# Patient Record
Sex: Female | Born: 1935 | Race: White | Hispanic: No | Marital: Single | Smoking: Current every day smoker
Health system: Southern US, Community
[De-identification: ages and names within clinical notes are randomized; demographics above are authoritative.]

## PROBLEM LIST (undated history)

## (undated) DIAGNOSIS — E039 Hypothyroidism, unspecified: Secondary | ICD-10-CM

## (undated) DIAGNOSIS — K219 Gastro-esophageal reflux disease without esophagitis: Secondary | ICD-10-CM

## (undated) DIAGNOSIS — J45909 Unspecified asthma, uncomplicated: Secondary | ICD-10-CM

## (undated) DIAGNOSIS — Q254 Congenital malformation of aorta unspecified: Secondary | ICD-10-CM

## (undated) DIAGNOSIS — N189 Chronic kidney disease, unspecified: Secondary | ICD-10-CM

## (undated) DIAGNOSIS — F32A Depression, unspecified: Secondary | ICD-10-CM

## (undated) DIAGNOSIS — M4800 Spinal stenosis, site unspecified: Secondary | ICD-10-CM

## (undated) DIAGNOSIS — C801 Malignant (primary) neoplasm, unspecified: Secondary | ICD-10-CM

## (undated) DIAGNOSIS — I10 Essential (primary) hypertension: Secondary | ICD-10-CM

## (undated) DIAGNOSIS — Z9221 Personal history of antineoplastic chemotherapy: Secondary | ICD-10-CM

## (undated) DIAGNOSIS — F419 Anxiety disorder, unspecified: Secondary | ICD-10-CM

## (undated) DIAGNOSIS — J449 Chronic obstructive pulmonary disease, unspecified: Secondary | ICD-10-CM

## (undated) DIAGNOSIS — I2510 Atherosclerotic heart disease of native coronary artery without angina pectoris: Secondary | ICD-10-CM

## (undated) DIAGNOSIS — F329 Major depressive disorder, single episode, unspecified: Secondary | ICD-10-CM

## (undated) DIAGNOSIS — D649 Anemia, unspecified: Secondary | ICD-10-CM

## (undated) HISTORY — PX: BACK SURGERY: SHX140

## (undated) HISTORY — PX: ABDOMINAL HYSTERECTOMY: SHX81

## (undated) HISTORY — PX: OTHER SURGICAL HISTORY: SHX169

## (undated) HISTORY — PX: SHOULDER SURGERY: SHX246

---

## 2001-11-06 HISTORY — PX: BREAST BIOPSY: SHX20

## 2003-10-21 ENCOUNTER — Other Ambulatory Visit: Payer: Self-pay

## 2004-11-08 ENCOUNTER — Ambulatory Visit: Payer: Self-pay | Admitting: Anesthesiology

## 2004-11-09 ENCOUNTER — Ambulatory Visit: Payer: Self-pay | Admitting: Anesthesiology

## 2004-11-28 ENCOUNTER — Ambulatory Visit: Payer: Self-pay | Admitting: Anesthesiology

## 2004-12-06 ENCOUNTER — Ambulatory Visit: Payer: Self-pay | Admitting: Orthopaedic Surgery

## 2005-05-03 ENCOUNTER — Ambulatory Visit: Payer: Self-pay | Admitting: Family Medicine

## 2005-08-25 ENCOUNTER — Ambulatory Visit: Payer: Self-pay | Admitting: Family Medicine

## 2006-04-02 ENCOUNTER — Other Ambulatory Visit: Payer: Self-pay

## 2006-04-02 ENCOUNTER — Inpatient Hospital Stay: Payer: Self-pay | Admitting: Internal Medicine

## 2006-08-30 ENCOUNTER — Ambulatory Visit: Payer: Self-pay | Admitting: Family Medicine

## 2007-08-06 ENCOUNTER — Encounter: Payer: Self-pay | Admitting: Sports Medicine

## 2007-08-12 ENCOUNTER — Ambulatory Visit: Payer: Self-pay | Admitting: Sports Medicine

## 2007-09-07 ENCOUNTER — Encounter: Payer: Self-pay | Admitting: Sports Medicine

## 2007-09-09 ENCOUNTER — Ambulatory Visit: Payer: Self-pay | Admitting: Unknown Physician Specialty

## 2007-09-09 ENCOUNTER — Other Ambulatory Visit: Payer: Self-pay

## 2007-09-16 ENCOUNTER — Inpatient Hospital Stay: Payer: Self-pay | Admitting: Unknown Physician Specialty

## 2007-12-18 ENCOUNTER — Ambulatory Visit: Payer: Self-pay | Admitting: Internal Medicine

## 2008-12-18 ENCOUNTER — Ambulatory Visit: Payer: Self-pay | Admitting: Internal Medicine

## 2008-12-25 ENCOUNTER — Ambulatory Visit: Payer: Self-pay | Admitting: Unknown Physician Specialty

## 2009-02-24 ENCOUNTER — Ambulatory Visit: Payer: Self-pay | Admitting: Internal Medicine

## 2009-12-20 ENCOUNTER — Ambulatory Visit: Payer: Self-pay | Admitting: Internal Medicine

## 2010-02-01 ENCOUNTER — Ambulatory Visit: Payer: Self-pay | Admitting: Cardiovascular Disease

## 2010-02-01 ENCOUNTER — Inpatient Hospital Stay: Payer: Self-pay | Admitting: Internal Medicine

## 2010-03-01 ENCOUNTER — Ambulatory Visit: Payer: Self-pay | Admitting: Gastroenterology

## 2010-04-06 ENCOUNTER — Ambulatory Visit: Payer: Self-pay | Admitting: Surgery

## 2010-09-02 ENCOUNTER — Inpatient Hospital Stay: Payer: Self-pay | Admitting: *Deleted

## 2010-10-13 ENCOUNTER — Ambulatory Visit: Payer: Self-pay | Admitting: Gastroenterology

## 2010-10-24 ENCOUNTER — Inpatient Hospital Stay: Payer: Self-pay | Admitting: Internal Medicine

## 2011-01-09 ENCOUNTER — Ambulatory Visit: Payer: Self-pay | Admitting: Gastroenterology

## 2011-01-10 ENCOUNTER — Ambulatory Visit: Payer: Self-pay | Admitting: Family Medicine

## 2011-01-23 ENCOUNTER — Ambulatory Visit: Payer: Self-pay | Admitting: Family Medicine

## 2011-02-08 ENCOUNTER — Ambulatory Visit: Payer: Self-pay | Admitting: Gastroenterology

## 2011-07-31 ENCOUNTER — Ambulatory Visit: Payer: Self-pay | Admitting: Family Medicine

## 2012-01-17 ENCOUNTER — Ambulatory Visit: Payer: Self-pay | Admitting: Ophthalmology

## 2012-01-30 ENCOUNTER — Ambulatory Visit: Payer: Self-pay | Admitting: Ophthalmology

## 2012-08-09 ENCOUNTER — Ambulatory Visit: Payer: Self-pay | Admitting: Otolaryngology

## 2012-11-22 ENCOUNTER — Ambulatory Visit: Payer: Self-pay | Admitting: Vascular Surgery

## 2012-11-27 ENCOUNTER — Other Ambulatory Visit (HOSPITAL_COMMUNITY): Payer: Self-pay | Admitting: *Deleted

## 2012-11-27 DIAGNOSIS — Z139 Encounter for screening, unspecified: Secondary | ICD-10-CM

## 2012-12-09 ENCOUNTER — Ambulatory Visit (HOSPITAL_COMMUNITY): Payer: Self-pay

## 2012-12-24 ENCOUNTER — Ambulatory Visit: Payer: Self-pay

## 2013-01-18 ENCOUNTER — Emergency Department: Payer: Self-pay | Admitting: Emergency Medicine

## 2013-11-26 ENCOUNTER — Ambulatory Visit: Payer: Self-pay | Admitting: Physical Medicine and Rehabilitation

## 2013-12-05 ENCOUNTER — Ambulatory Visit: Payer: Self-pay | Admitting: Vascular Surgery

## 2014-01-19 ENCOUNTER — Ambulatory Visit: Payer: Self-pay | Admitting: Physician Assistant

## 2015-02-11 ENCOUNTER — Ambulatory Visit
Admit: 2015-02-11 | Disposition: A | Discharge status: Home / Self Care | Payer: Self-pay | Attending: Physician Assistant | Admitting: Physician Assistant

## 2015-02-28 NOTE — Op Note (Signed)
PATIENT NAME:  Veronica Hubbard, Veronica Hubbard MR#:  [id number] DATE OF BIRTH:  27-Feb-1935  DATE OF PROCEDURE:  01/30/2012  PREOPERATIVE DIAGNOSIS: Visually significant cataract of the right eye.   POSTOPERATIVE DIAGNOSIS: Visually significant cataract of the right eye.   OPERATIVE PROCEDURE: Cataract extraction by phacoemulsification with implant of intraocular lens to the right eye.   SURGEON: Birder Robson, MD.   ANESTHESIA:  1. Managed anesthesia care.  2. Topical tetracaine drops followed by 2% Xylocaine jelly applied in the preoperative holding area.   COMPLICATIONS: None.   TECHNIQUE:  Stop and chop.  DESCRIPTION OF PROCEDURE: The patient was examined and consented in the preoperative holding area where the aforementioned topical anesthesia was applied to the right eye and then brought back to the Operating Room where the right eye was prepped and draped in the usual sterile ophthalmic fashion and a lid speculum was placed. A paracentesis was created with the side port blade and the anterior chamber was filled with viscoelastic. A near clear corneal incision was performed with the steel keratome. A continuous curvilinear capsulorrhexis was performed with a cystotome followed by the capsulorrhexis forceps. Hydrodissection and hydrodelineation were carried out with BSS on a blunt cannula. The lens was removed in a stop and chop technique and the remaining cortical material was removed with the irrigation-aspiration handpiece. The capsular bag was inflated with viscoelastic and the Technos ZCB00 24-diopter lens, serial number [id number] was placed in the capsular bag without complication. The remaining viscoelastic was removed from the eye with the irrigation-aspiration handpiece. The wounds were hydrated. The anterior chamber was flushed with Miostat and the eye was inflated to physiologic pressure. The wounds were found to be water tight. The eye was dressed with Vigamox. The patient was given  protective glasses to wear throughout the day and a shield with which to sleep tonight. The patient was also given drops with which to begin a drop regimen today and will follow up with me in one day.    ____________________________ Livingston Diones. Tashauna Caisse, MD wlp:bjt D: 01/30/2012 12:24:22 ET T: 01/30/2012 13:05:14 ET JOB#: [id number]  cc: Lance Huaracha L. Shelbie Franken, MD, <Dictator> Livingston Diones Yesica Kemler MD ELECTRONICALLY SIGNED 02/09/2012 20:56

## 2016-02-09 ENCOUNTER — Emergency Department: Payer: Medicare HMO

## 2016-02-09 ENCOUNTER — Observation Stay
Admission: EM | Admit: 2016-02-09 | Discharge: 2016-02-10 | Disposition: A | Discharge status: Home / Self Care | Payer: Medicare HMO | Attending: Internal Medicine | Admitting: Internal Medicine

## 2016-02-09 ENCOUNTER — Encounter: Payer: Self-pay | Admitting: Emergency Medicine

## 2016-02-09 DIAGNOSIS — R161 Splenomegaly, not elsewhere classified: Secondary | ICD-10-CM | POA: Diagnosis not present

## 2016-02-09 DIAGNOSIS — K219 Gastro-esophageal reflux disease without esophagitis: Secondary | ICD-10-CM | POA: Diagnosis not present

## 2016-02-09 DIAGNOSIS — D72829 Elevated white blood cell count, unspecified: Secondary | ICD-10-CM | POA: Diagnosis present

## 2016-02-09 DIAGNOSIS — Z885 Allergy status to narcotic agent status: Secondary | ICD-10-CM | POA: Diagnosis not present

## 2016-02-09 DIAGNOSIS — R1084 Generalized abdominal pain: Secondary | ICD-10-CM

## 2016-02-09 DIAGNOSIS — R079 Chest pain, unspecified: Secondary | ICD-10-CM | POA: Insufficient documentation

## 2016-02-09 DIAGNOSIS — Z8249 Family history of ischemic heart disease and other diseases of the circulatory system: Secondary | ICD-10-CM | POA: Diagnosis not present

## 2016-02-09 DIAGNOSIS — K5730 Diverticulosis of large intestine without perforation or abscess without bleeding: Secondary | ICD-10-CM | POA: Insufficient documentation

## 2016-02-09 DIAGNOSIS — I517 Cardiomegaly: Secondary | ICD-10-CM | POA: Insufficient documentation

## 2016-02-09 DIAGNOSIS — Z91041 Radiographic dye allergy status: Secondary | ICD-10-CM | POA: Insufficient documentation

## 2016-02-09 DIAGNOSIS — J45909 Unspecified asthma, uncomplicated: Secondary | ICD-10-CM | POA: Insufficient documentation

## 2016-02-09 DIAGNOSIS — I10 Essential (primary) hypertension: Secondary | ICD-10-CM | POA: Insufficient documentation

## 2016-02-09 DIAGNOSIS — Z90710 Acquired absence of both cervix and uterus: Secondary | ICD-10-CM | POA: Diagnosis not present

## 2016-02-09 DIAGNOSIS — R531 Weakness: Secondary | ICD-10-CM | POA: Diagnosis present

## 2016-02-09 DIAGNOSIS — E039 Hypothyroidism, unspecified: Secondary | ICD-10-CM | POA: Insufficient documentation

## 2016-02-09 DIAGNOSIS — Z888 Allergy status to other drugs, medicaments and biological substances status: Secondary | ICD-10-CM | POA: Insufficient documentation

## 2016-02-09 DIAGNOSIS — Z806 Family history of leukemia: Secondary | ICD-10-CM | POA: Diagnosis not present

## 2016-02-09 DIAGNOSIS — N289 Disorder of kidney and ureter, unspecified: Secondary | ICD-10-CM | POA: Diagnosis not present

## 2016-02-09 DIAGNOSIS — N200 Calculus of kidney: Secondary | ICD-10-CM | POA: Diagnosis not present

## 2016-02-09 DIAGNOSIS — Z886 Allergy status to analgesic agent status: Secondary | ICD-10-CM | POA: Diagnosis not present

## 2016-02-09 DIAGNOSIS — E785 Hyperlipidemia, unspecified: Secondary | ICD-10-CM | POA: Diagnosis not present

## 2016-02-09 DIAGNOSIS — M4800 Spinal stenosis, site unspecified: Secondary | ICD-10-CM | POA: Insufficient documentation

## 2016-02-09 DIAGNOSIS — J9811 Atelectasis: Secondary | ICD-10-CM | POA: Diagnosis not present

## 2016-02-09 DIAGNOSIS — Z87891 Personal history of nicotine dependence: Secondary | ICD-10-CM | POA: Diagnosis not present

## 2016-02-09 DIAGNOSIS — A0811 Acute gastroenteropathy due to Norwalk agent: Principal | ICD-10-CM | POA: Diagnosis present

## 2016-02-09 DIAGNOSIS — F329 Major depressive disorder, single episode, unspecified: Secondary | ICD-10-CM | POA: Diagnosis not present

## 2016-02-09 DIAGNOSIS — I700 Atherosclerosis of aorta: Secondary | ICD-10-CM | POA: Insufficient documentation

## 2016-02-09 DIAGNOSIS — Z833 Family history of diabetes mellitus: Secondary | ICD-10-CM | POA: Insufficient documentation

## 2016-02-09 HISTORY — DX: Major depressive disorder, single episode, unspecified: F32.9

## 2016-02-09 HISTORY — DX: Unspecified asthma, uncomplicated: J45.909

## 2016-02-09 HISTORY — DX: Depression, unspecified: F32.A

## 2016-02-09 HISTORY — DX: Essential (primary) hypertension: I10

## 2016-02-09 HISTORY — DX: Hypothyroidism, unspecified: E03.9

## 2016-02-09 HISTORY — DX: Spinal stenosis, site unspecified: M48.00

## 2016-02-09 HISTORY — DX: Gastro-esophageal reflux disease without esophagitis: K21.9

## 2016-02-09 LAB — URINALYSIS COMPLETE WITH MICROSCOPIC (ARMC ONLY)
BILIRUBIN URINE: NEGATIVE
Glucose, UA: NEGATIVE mg/dL
KETONES UR: NEGATIVE mg/dL
Nitrite: NEGATIVE
PH: 5 (ref 5.0–8.0)
PROTEIN: NEGATIVE mg/dL
Specific Gravity, Urine: 1.014 (ref 1.005–1.030)

## 2016-02-09 LAB — COMPREHENSIVE METABOLIC PANEL
ALBUMIN: 4.8 g/dL (ref 3.5–5.0)
ALK PHOS: 54 U/L (ref 38–126)
ALT: 12 U/L — ABNORMAL LOW (ref 14–54)
ANION GAP: 8 (ref 5–15)
AST: 31 U/L (ref 15–41)
BUN: 25 mg/dL — ABNORMAL HIGH (ref 6–20)
CALCIUM: 10.3 mg/dL (ref 8.9–10.3)
CHLORIDE: 109 mmol/L (ref 101–111)
CO2: 20 mmol/L — AB (ref 22–32)
Creatinine, Ser: 1.62 mg/dL — ABNORMAL HIGH (ref 0.44–1.00)
GFR calc non Af Amer: 29 mL/min — ABNORMAL LOW (ref >60)
GFR, EST AFRICAN AMERICAN: 34 mL/min — AB (ref >60)
GLUCOSE: 141 mg/dL — AB (ref 65–99)
POTASSIUM: 3.8 mmol/L (ref 3.5–5.1)
SODIUM: 137 mmol/L (ref 135–145)
Total Bilirubin: 0.7 mg/dL (ref 0.3–1.2)
Total Protein: 8 g/dL (ref 6.5–8.1)

## 2016-02-09 LAB — GASTROINTESTINAL PANEL BY PCR, STOOL (REPLACES STOOL CULTURE)
Adenovirus F40/41: NOT DETECTED
Astrovirus: NOT DETECTED
CYCLOSPORA CAYETANENSIS: NOT DETECTED
Campylobacter species: NOT DETECTED
Cryptosporidium: NOT DETECTED
E. COLI O157: NOT DETECTED
ENTAMOEBA HISTOLYTICA: NOT DETECTED
ENTEROAGGREGATIVE E COLI (EAEC): NOT DETECTED
ENTEROPATHOGENIC E COLI (EPEC): NOT DETECTED
Enterotoxigenic E coli (ETEC): NOT DETECTED
GIARDIA LAMBLIA: NOT DETECTED
Norovirus GI/GII: DETECTED — AB
Plesimonas shigelloides: NOT DETECTED
Rotavirus A: NOT DETECTED
SALMONELLA SPECIES: NOT DETECTED
SAPOVIRUS (I, II, IV, AND V): NOT DETECTED
SHIGELLA/ENTEROINVASIVE E COLI (EIEC): NOT DETECTED
Shiga like toxin producing E coli (STEC): NOT DETECTED
VIBRIO CHOLERAE: NOT DETECTED
Vibrio species: NOT DETECTED
Yersinia enterocolitica: NOT DETECTED

## 2016-02-09 LAB — CBC
HEMATOCRIT: 43.5 % (ref 35.0–47.0)
HEMOGLOBIN: 14.1 g/dL (ref 12.0–16.0)
MCH: 25.8 pg — AB (ref 26.0–34.0)
MCHC: 32.5 g/dL (ref 32.0–36.0)
MCV: 79.6 fL — AB (ref 80.0–100.0)
Platelets: 430 10*3/uL (ref 150–440)
RBC: 5.46 MIL/uL — AB (ref 3.80–5.20)
RDW: 21.5 % — ABNORMAL HIGH (ref 11.5–14.5)
WBC: 26.9 10*3/uL — ABNORMAL HIGH (ref 3.6–11.0)

## 2016-02-09 LAB — C DIFFICILE QUICK SCREEN W PCR REFLEX
C DIFFICILE (CDIFF) TOXIN: NEGATIVE
C Diff antigen: NEGATIVE
C Diff interpretation: NEGATIVE

## 2016-02-09 LAB — LIPASE, BLOOD: Lipase: 36 U/L (ref 11–51)

## 2016-02-09 MED ORDER — CITALOPRAM HYDROBROMIDE 20 MG PO TABS
20.0000 mg | ORAL_TABLET | Freq: Every day | ORAL | Status: DC
  Administered 2016-02-09 – 2016-02-10 (×2): 20 mg via ORAL
  Filled 2016-02-09 (×2): qty 1

## 2016-02-09 MED ORDER — LOPERAMIDE HCL 2 MG PO CAPS: 2.0000 mg | ORAL_CAPSULE | Freq: Four times a day (QID) | ORAL | Status: DC | PRN

## 2016-02-09 MED ORDER — ALBUTEROL SULFATE (2.5 MG/3ML) 0.083% IN NEBU: 2.5000 mg | INHALATION_SOLUTION | RESPIRATORY_TRACT | Status: DC | PRN

## 2016-02-09 MED ORDER — ACETAMINOPHEN 650 MG RE SUPP: 650.0000 mg | Freq: Four times a day (QID) | RECTAL | Status: DC | PRN

## 2016-02-09 MED ORDER — ACETAMINOPHEN 325 MG PO TABS: 650.0000 mg | ORAL_TABLET | Freq: Four times a day (QID) | ORAL | Status: DC | PRN

## 2016-02-09 MED ORDER — PANTOPRAZOLE SODIUM 40 MG PO TBEC
40.0000 mg | DELAYED_RELEASE_TABLET | Freq: Two times a day (BID) | ORAL | Status: DC
  Administered 2016-02-09 – 2016-02-10 (×3): 40 mg via ORAL
  Filled 2016-02-09 (×3): qty 1

## 2016-02-09 MED ORDER — ONDANSETRON HCL 4 MG/2ML IJ SOLN
4.0000 mg | Freq: Once | INTRAMUSCULAR | Status: AC
  Administered 2016-02-09: 4 mg via INTRAVENOUS
  Filled 2016-02-09: qty 2

## 2016-02-09 MED ORDER — OXYCODONE HCL 5 MG PO TABS: 5.0000 mg | ORAL_TABLET | ORAL | Status: DC | PRN

## 2016-02-09 MED ORDER — DIATRIZOATE MEGLUMINE & SODIUM 66-10 % PO SOLN: 15.0000 mL | ORAL | Status: DC

## 2016-02-09 MED ORDER — ONDANSETRON HCL 4 MG PO TABS: 4.0000 mg | ORAL_TABLET | Freq: Four times a day (QID) | ORAL | Status: DC | PRN

## 2016-02-09 MED ORDER — LEVOTHYROXINE SODIUM 100 MCG PO TABS
100.0000 ug | ORAL_TABLET | Freq: Every day | ORAL | Status: DC
  Administered 2016-02-10: 100 ug via ORAL
  Filled 2016-02-09: qty 1

## 2016-02-09 MED ORDER — ENOXAPARIN SODIUM 30 MG/0.3ML ~~LOC~~ SOLN: 30.0000 mg | SUBCUTANEOUS | Status: DC

## 2016-02-09 MED ORDER — ONDANSETRON HCL 4 MG/2ML IJ SOLN: 4.0000 mg | Freq: Four times a day (QID) | INTRAMUSCULAR | Status: DC | PRN

## 2016-02-09 MED ORDER — MORPHINE SULFATE (PF) 2 MG/ML IV SOLN
2.0000 mg | Freq: Once | INTRAVENOUS | Status: AC
  Administered 2016-02-09: 2 mg via INTRAVENOUS
  Filled 2016-02-09: qty 1

## 2016-02-09 MED ORDER — ENOXAPARIN SODIUM 40 MG/0.4ML ~~LOC~~ SOLN
40.0000 mg | SUBCUTANEOUS | Status: DC
  Administered 2016-02-09: 40 mg via SUBCUTANEOUS
  Filled 2016-02-09: qty 0.4

## 2016-02-09 MED ORDER — MORPHINE SULFATE (PF) 2 MG/ML IV SOLN: 1.0000 mg | INTRAVENOUS | Status: DC | PRN

## 2016-02-09 MED ORDER — EZETIMIBE 10 MG PO TABS
10.0000 mg | ORAL_TABLET | Freq: Every day | ORAL | Status: DC
  Administered 2016-02-09 – 2016-02-10 (×2): 10 mg via ORAL
  Filled 2016-02-09 (×2): qty 1

## 2016-02-09 MED ORDER — ALBUTEROL SULFATE HFA 108 (90 BASE) MCG/ACT IN AERS: 2.0000 | INHALATION_SPRAY | RESPIRATORY_TRACT | Status: DC | PRN

## 2016-02-09 MED ORDER — POTASSIUM CHLORIDE IN NACL 20-0.9 MEQ/L-% IV SOLN
INTRAVENOUS | Status: DC
  Administered 2016-02-09 – 2016-02-10 (×2): via INTRAVENOUS
  Filled 2016-02-09 (×4): qty 1000

## 2016-02-09 MED ORDER — LOPERAMIDE HCL 2 MG PO CAPS
4.0000 mg | ORAL_CAPSULE | Freq: Once | ORAL | Status: AC
Start: 2016-02-09 — End: 2016-02-09
  Administered 2016-02-09: 4 mg via ORAL
  Filled 2016-02-09: qty 2

## 2016-02-09 MED ORDER — SODIUM CHLORIDE 0.9 % IV BOLUS (SEPSIS)
1000.0000 mL | Freq: Once | INTRAVENOUS | Status: AC
  Administered 2016-02-09: 1000 mL via INTRAVENOUS

## 2016-02-09 MED ORDER — ISOSORBIDE MONONITRATE ER 30 MG PO TB24
30.0000 mg | ORAL_TABLET | Freq: Every day | ORAL | Status: DC
  Administered 2016-02-09 – 2016-02-10 (×2): 30 mg via ORAL
  Filled 2016-02-09 (×2): qty 1

## 2016-02-09 NOTE — ED Notes (Addendum)
Pt from home with n/v/d since yesterday. Pt states vomit was orange. Pt visited brother yesterday who has c-diff. Pt states she was given nausea medication en route. denies any fever pt A&O

## 2016-02-09 NOTE — ED Notes (Signed)
MD at bedside. 

## 2016-02-09 NOTE — Progress Notes (Signed)
Admitted to [facility] for norovirus, contact precautions initiated.  Patient resides at home alone and is considered to be a poor historian with some confusion.  Stated that she has had several falls at home and was placed on high fall risk.  Oriented to the room and verbalized understanding.  Denies pain and nausea but having frequent loose stools. Patient stated that POA is [place].  Will continue to monitor. Haynes Hoehn RN.

## 2016-02-09 NOTE — ED Notes (Signed)
Dr. Bobbye Charleston in room at this time.

## 2016-02-09 NOTE — H&P (Signed)
[place] at [place] NAME: Veronica Hubbard    MR#:  [id number]  DATE OF BIRTH:  1934/11/30  DATE OF ADMISSION:  02/09/2016  PRIMARY CARE PHYSICIAN: [facility]  REQUESTING/REFERRING PHYSICIAN: Jacinta Shoe  CHIEF COMPLAINT:   Chief Complaint  Patient presents with  . Emesis    HISTORY OF PRESENT ILLNESS:  Veronica Hubbard  is a 81 y.o. female presents with nausea vomiting and diarrhea. Patient also has severe abdominal pain. It all started yesterday. She visited brother in the hospital has similar symptoms but has C. difficile colitis. The patient developed nausea vomiting too many times to count. Abdominal pain 10 out of 10 intensity. Diarrhea watery and constant. No hematemesis or blood in the bowel movements. No fever but does have some chills. Patient's CAT scan of the abdomen and pelvis was negative but the patient did have positive norovirus  PAST MEDICAL HISTORY:   Past Medical History  Diagnosis Date  . Hypertension   . Asthma   . Hypothyroidism   . Depression   . GERD (gastroesophageal reflux disease)   . Spinal stenosis     PAST SURGICAL HISTORY:   Past Surgical History  Procedure Laterality Date  . Abdominal hysterectomy    . Cataracts    . Back surgery    . Shoulder surgery    . Lipoma removal      SOCIAL HISTORY:   Social History  Substance Use Topics  . Smoking status: Former Smoker    Types: Cigarettes  . Smokeless tobacco: Not on file  . Alcohol Use: No    FAMILY HISTORY:   Family History  Problem Relation Age of Onset  . Leukemia Mother   . CAD Father   . Diabetes Father     DRUG ALLERGIES:   Allergies  Allergen Reactions  . Aspirin     Other reaction(s): Unknown  . Celecoxib     Other reaction(s): Unknown  . Iodinated Diagnostic Agents     Other reaction(s): Other (See Comments) Decreased kidney function  . Losartan Itching  . Morphine     Other  reaction(s): Unknown  . Verapamil Itching    REVIEW OF SYSTEMS:  CONSTITUTIONAL: No fever, positive fatigue and weakness. Positive for chills EYES: No blurred or double vision.  EARS, NOSE, AND THROAT: No tinnitus or ear pain. No sore throat. Dry throat. RESPIRATORY: No cough, shortness of breath, wheezing or hemoptysis.  CARDIOVASCULAR: Positive for chest pain, no orthopnea, edema.  GASTROINTESTINAL: Positive for nausea, vomiting, diarrhea and abdominal pain. No blood in bowel movements GENITOURINARY: No dysuria, hematuria.  ENDOCRINE: No polyuria, nocturia,  HEMATOLOGY: No anemia, easy bruising or bleeding SKIN: No rash or lesion. MUSCULOSKELETAL: Positive for back pain and leg pain NEUROLOGIC: No tingling, numbness, weakness.  PSYCHIATRY: No anxiety or depression.   MEDICATIONS AT HOME:   Prior to Admission medications   Medication Sig Start Date End Date Taking? Authorizing Provider  albuterol (PROVENTIL HFA;VENTOLIN HFA) 108 (90 Base) MCG/ACT inhaler Inhale 2 puffs into the lungs every 4 (four) hours as needed for wheezing or shortness of breath.   Yes Historical Provider, MD  citalopram (CELEXA) 20 MG tablet Take 20 mg by mouth daily.   Yes Historical Provider, MD  ezetimibe (ZETIA) 10 MG tablet Take 10 mg by mouth daily.   Yes Historical Provider, MD  isosorbide mononitrate (IMDUR) 30 MG 24 hr tablet Take 30 mg by mouth daily.   Yes Historical Provider, MD  levothyroxine (SYNTHROID, LEVOTHROID) 100 MCG tablet Take 100 mcg by mouth daily before breakfast.   Yes Historical Provider, MD  omega-3 acid ethyl esters (LOVAZA) 1 g capsule Take 1 g by mouth daily.   Yes Historical Provider, MD  pantoprazole (PROTONIX) 40 MG tablet Take 40 mg by mouth 2 (two) times daily.   Yes Historical Provider, MD  valsartan (DIOVAN) 80 MG tablet Take 80 mg by mouth daily.   Yes Historical Provider, MD      VITAL SIGNS:  Blood pressure 125/54, pulse 59, temperature 97.7 F (36.5 C), temperature  source Oral, resp. rate 12, height 5\' 3"  (1.6 m), weight 78 kg (171 lb 15.3 oz), SpO2 94 %.  PHYSICAL EXAMINATION:  GENERAL:  81 y.o.-year-old patient lying in the bed with no acute distress.  EYES: Pupils equal, round, reactive to light and accommodation. No scleral icterus. Extraocular muscles intact.  HEENT: Head atraumatic, normocephalic. Oropharynx and nasopharynx clear.  NECK:  Supple, no jugular venous distention. No thyroid enlargement, no tenderness.  LUNGS: Normal breath sounds bilaterally, no wheezing, rales,rhonchi or crepitation. No use of accessory muscles of respiration.  CARDIOVASCULAR: S1, S2 normal. No murmurs, rubs, or gallops.  ABDOMEN: Soft, generalized tenderness, nondistended. Bowel sounds present. No organomegaly or mass.  EXTREMITIES: No pedal edema, cyanosis, or clubbing.  NEUROLOGIC: Cranial nerves II through XII are intact. Muscle strength 5/5 in all extremities. Sensation intact. Gait not checked.  PSYCHIATRIC: The patient is alert and oriented x 3.  SKIN: No rash, lesion, or ulcer.   LABORATORY PANEL:   CBC  Recent Labs Lab 02/09/16 0342  WBC 26.9*  HGB 14.1  HCT 43.5  PLT 430   ------------------------------------------------------------------------------------------------------------------  Chemistries   Recent Labs Lab 02/09/16 0342  NA 137  K 3.8  CL 109  CO2 20*  GLUCOSE 141*  BUN 25*  CREATININE 1.62*  CALCIUM 10.3  AST 31  ALT 12*  ALKPHOS 54  BILITOT 0.7   ------------------------------------------------------------------------------------------------------------------  ------------------------------------------------------------------------------------------------------------  RADIOLOGY:  Ct Abdomen Pelvis Wo Contrast  02/09/2016  CLINICAL DATA:  Generalized abdominal pain.  Vomiting. EXAM: CT ABDOMEN AND PELVIS WITHOUT CONTRAST TECHNIQUE: Multidetector CT imaging of the abdomen and pelvis was performed following the standard  protocol without IV contrast. COMPARISON:  02/02/2010 FINDINGS: Lower chest: Heart is mildly enlarged. Densely calcified aortic valve and distal thoracic aorta. Linear dependent atelectasis in the lung bases. No effusions. Hepatobiliary: Unremarkable unenhanced appearance gallbladder unremarkable. Pancreas: Unremarkable unenhanced appearance Spleen: Spleen is enlarged with a craniocaudal length of 16 cm. No focal abnormality. Adrenals/Urinary Tract: Scattered low-density lesions within the kidneys difficult to characterize on this unenhanced study but most likely small cysts. 7 mm right renal pelvic stone. Vascular calcifications elsewhere in the hila bilaterally. No ureteral stones or hydronephrosis. Urinary bladder and adrenal glands are unremarkable. Stomach/Bowel: Scattered sigmoid and right colonic diverticulosis. No active diverticulitis. Stomach and small bowel are decompressed, grossly unremarkable. Vascular/Lymphatic: Diffuse aortic and branch vessel calcifications. Slight dilatation of the proximal to mid abdominal aorta, 3.3 cm. Reproductive: Prior hysterectomy.  No adnexal masses. Other: No free fluid or free air. Musculoskeletal: No acute bony abnormality or focal bone lesion. Degenerative changes in the lumbar spine. IMPRESSION: Mild cardiomegaly.  Bibasilar atelectasis. Splenomegaly. Scattered colonic diverticulosis.  No active diverticulitis. Aortic atherosclerosis. Electronically Signed   By: Rolm Baptise M.D.   On: 02/09/2016 10:27       IMPRESSION AND PLAN:   1. Gastroenteritis with norovirus. Patient with nausea vomiting diarrhea and severe abdominal pain. Supportive  care with IV fluids, IV and oral pain medications, IV nausea medications and when necessary Imodium 2. Chest pain likely gastroesophageal reflux disease with vomiting. Continue Protonix 3. Renal insufficiency give IV fluid hydration hold Diovan. 4. Essential hypertension stable 5. Asthma respiratory status stable. Patient  wears oxygen at night. 6. Hyperlipidemia on Zetia 7. Depression on Celexa 8. Hypothyroidism unspecified on levothyroxine   All the records are reviewed and case discussed with ED provider. Management plans discussed with the patient, family and they are in agreement.  CODE STATUS: Full code  TOTAL TIME TAKING CARE OF THIS PATIENT: 50 minutes.    Loletha Grayer M.D on 02/09/2016 at 11:15 AM  Between 7am to 6pm - Pager - [phone]  After 6pm call admission pager [phone]  [facility] Office  [phone]  CC: Primary care physician; [facility]

## 2016-02-09 NOTE — ED Provider Notes (Addendum)
IMPRESSION: Mild cardiomegaly. Bibasilar atelectasis.  Splenomegaly.  Scattered colonic diverticulosis. No active diverticulitis.  Aortic atherosclerosis.  Patient tested positive for Norovirus, negative for C. difficile. White cell count elevation is likely stress response from her viral illness.  Earleen Newport, MD 02/09/16 1031  Patient does not feel well enough to go home, still actively having diarrhea. We have given additional saline here. We'll recommend observation.  Earleen Newport, MD 02/09/16 [id number]

## 2016-02-10 DIAGNOSIS — A0811 Acute gastroenteropathy due to Norwalk agent: Secondary | ICD-10-CM | POA: Diagnosis not present

## 2016-02-10 LAB — BASIC METABOLIC PANEL
Anion gap: 2 — ABNORMAL LOW (ref 5–15)
BUN: 14 mg/dL (ref 6–20)
CHLORIDE: 116 mmol/L — AB (ref 101–111)
CO2: 21 mmol/L — ABNORMAL LOW (ref 22–32)
CREATININE: 1.27 mg/dL — AB (ref 0.44–1.00)
Calcium: 8.7 mg/dL — ABNORMAL LOW (ref 8.9–10.3)
GFR calc Af Amer: 45 mL/min — ABNORMAL LOW (ref >60)
GFR, EST NON AFRICAN AMERICAN: 39 mL/min — AB (ref >60)
GLUCOSE: 90 mg/dL (ref 65–99)
Potassium: 3.8 mmol/L (ref 3.5–5.1)
SODIUM: 139 mmol/L (ref 135–145)

## 2016-02-10 LAB — CBC
HEMATOCRIT: 35.1 % (ref 35.0–47.0)
Hemoglobin: 11.5 g/dL — ABNORMAL LOW (ref 12.0–16.0)
MCH: 25.9 pg — ABNORMAL LOW (ref 26.0–34.0)
MCHC: 32.7 g/dL (ref 32.0–36.0)
MCV: 79.2 fL — AB (ref 80.0–100.0)
PLATELETS: 309 10*3/uL (ref 150–440)
RBC: 4.43 MIL/uL (ref 3.80–5.20)
RDW: 21.5 % — AB (ref 11.5–14.5)
WBC: 13.3 10*3/uL — AB (ref 3.6–11.0)

## 2016-02-10 MED ORDER — ENOXAPARIN SODIUM 40 MG/0.4ML ~~LOC~~ SOLN
40.0000 mg | SUBCUTANEOUS | Status: DC
  Administered 2016-02-10: 40 mg via SUBCUTANEOUS
  Filled 2016-02-10: qty 0.4

## 2016-02-10 NOTE — Discharge Summary (Signed)
[place] at [place] NAME: Veronica Hubbard    MR#:  [id number]  DATE OF BIRTH:  1935-03-02  DATE OF ADMISSION:  02/09/2016 ADMITTING PHYSICIAN: Loletha Grayer, MD  DATE OF DISCHARGE: 02/10/2016  PRIMARY CARE PHYSICIAN: No primary care provider on file.    ADMISSION DIAGNOSIS:  Leukocytosis [D72.829] Weakness [R53.1] Generalized abdominal pain [R10.84] Norovirus [A08.11]  DISCHARGE DIAGNOSIS:  Active Problems:   Gastroenteritis due to norovirus   SECONDARY DIAGNOSIS:   Past Medical History  Diagnosis Date  . Hypertension   . Asthma   . Hypothyroidism   . Depression   . GERD (gastroesophageal reflux disease)   . Spinal stenosis     HOSPITAL COURSE:   1. Gastroenteritis with norovirus. Patient with nausea vomiting diarrhea and severe abdominal pain. Supportive care with IV fluids, IV and oral pain medications, IV nausea medications and when necessary Imodium   She felt much better- last diarrhea and vomiting episode was last evening, giving regular diet now.   Advised about hand hyegine. 2. Chest pain likely gastroesophageal reflux disease with vomiting. Continue Protonix 3. Renal insufficiency give IV fluid hydration hold Diovan.    Renal func improved today. 4. Essential hypertension stable 5. Asthma respiratory status stable. Patient wears oxygen at night. 6. Hyperlipidemia on Zetia 7. Depression on Celexa 8. Hypothyroidism unspecified on levothyroxine  DISCHARGE CONDITIONS:   Stable.  CONSULTS OBTAINED:     DRUG ALLERGIES:   Allergies  Allergen Reactions  . Aspirin     Other reaction(s): Unknown  . Celecoxib     Other reaction(s): Unknown  . Iodinated Diagnostic Agents     Other reaction(s): Other (See Comments) Decreased kidney function  . Losartan Itching  . Morphine     Other reaction(s): Unknown  . Verapamil Itching    DISCHARGE MEDICATIONS:   Current Discharge Medication List     CONTINUE these medications which have NOT CHANGED   Details  albuterol (PROVENTIL HFA;VENTOLIN HFA) 108 (90 Base) MCG/ACT inhaler Inhale 2 puffs into the lungs every 4 (four) hours as needed for wheezing or shortness of breath.    citalopram (CELEXA) 20 MG tablet Take 20 mg by mouth daily.    ezetimibe (ZETIA) 10 MG tablet Take 10 mg by mouth daily.    isosorbide mononitrate (IMDUR) 30 MG 24 hr tablet Take 30 mg by mouth daily.    levothyroxine (SYNTHROID, LEVOTHROID) 100 MCG tablet Take 100 mcg by mouth daily before breakfast.    omega-3 acid ethyl esters (LOVAZA) 1 g capsule Take 1 g by mouth daily.    pantoprazole (PROTONIX) 40 MG tablet Take 40 mg by mouth 2 (two) times daily.    valsartan (DIOVAN) 80 MG tablet Take 80 mg by mouth daily.         DISCHARGE INSTRUCTIONS:    Follow with PMD in 2 weeks.  If you experience worsening of your admission symptoms, develop shortness of breath, life threatening emergency, suicidal or homicidal thoughts you must seek medical attention immediately by calling 911 or calling your MD immediately  if symptoms less severe.  You Must read complete instructions/literature along with all the possible adverse reactions/side effects for all the Medicines you take and that have been prescribed to you. Take any new Medicines after you have completely understood and accept all the possible adverse reactions/side effects.   Please note  You were cared for by a hospitalist during your hospital stay. If you have any questions about your  discharge medications or the care you received while you were in the hospital after you are discharged, you can call the unit and asked to speak with the hospitalist on call if the hospitalist that took care of you is not available. Once you are discharged, your primary care physician will handle any further medical issues. Please note that NO REFILLS for any discharge medications will be authorized once you are discharged,  as it is imperative that you return to your primary care physician (or establish a relationship with a primary care physician if you do not have one) for your aftercare needs so that they can reassess your need for medications and monitor your lab values.    Today   CHIEF COMPLAINT:   Chief Complaint  Patient presents with  . Emesis    HISTORY OF PRESENT ILLNESS:  Veronica Hubbard  is a 81 y.o. female presents with nausea vomiting and diarrhea. Patient also has severe abdominal pain. It all started yesterday. She visited brother in the hospital has similar symptoms but has C. difficile colitis. The patient developed nausea vomiting too many times to count. Abdominal pain 10 out of 10 intensity. Diarrhea watery and constant. No hematemesis or blood in the bowel movements. No fever but does have some chills. Patient's CAT scan of the abdomen and pelvis was negative but the patient did have positive norovirus   VITAL SIGNS:  Blood pressure 115/44, pulse 68, temperature 98.6 F (37 C), temperature source Oral, resp. rate 19, height 5\' 3"  (1.6 m), weight 78 kg (171 lb 15.3 oz), SpO2 99 %.  I/O:   Intake/Output Summary (Last 24 hours) at 02/10/16 1222 Last data filed at 02/10/16 1149  Gross per 24 hour  Intake 3204.02 ml  Output    600 ml  Net 2604.02 ml    PHYSICAL EXAMINATION:  GENERAL:  81 y.o.-year-old patient lying in the bed with no acute distress.  EYES: Pupils equal, round, reactive to light and accommodation. No scleral icterus. Extraocular muscles intact.  HEENT: Head atraumatic, normocephalic. Oropharynx and nasopharynx clear.  NECK:  Supple, no jugular venous distention. No thyroid enlargement, no tenderness.  LUNGS: Normal breath sounds bilaterally, no wheezing, rales,rhonchi or crepitation. No use of accessory muscles of respiration.  CARDIOVASCULAR: S1, S2 normal. No murmurs, rubs, or gallops.  ABDOMEN: Soft, non-tender, non-distended. Bowel sounds present. No  organomegaly or mass.  EXTREMITIES: No pedal edema, cyanosis, or clubbing.  NEUROLOGIC: Cranial nerves II through XII are intact. Muscle strength 5/5 in all extremities. Sensation intact. Gait not checked.  PSYCHIATRIC: The patient is alert and oriented x 3.  SKIN: No obvious rash, lesion, or ulcer.   DATA REVIEW:   CBC  Recent Labs Lab 02/10/16 0451  WBC 13.3*  HGB 11.5*  HCT 35.1  PLT 309    Chemistries   Recent Labs Lab 02/09/16 0342 02/10/16 0451  NA 137 139  K 3.8 3.8  CL 109 116*  CO2 20* 21*  GLUCOSE 141* 90  BUN 25* 14  CREATININE 1.62* 1.27*  CALCIUM 10.3 8.7*  AST 31  --   ALT 12*  --   ALKPHOS 54  --   BILITOT 0.7  --     Cardiac Enzymes No results for input(s): TROPONINI in the last 168 hours.  Microbiology Results  Results for orders placed or performed during the hospital encounter of 02/09/16  C difficile quick scan w PCR reflex     Status: None   Collection Time: 02/09/16  3:42  AM  Result Value Ref Range Status   C Diff antigen NEGATIVE NEGATIVE Final   C Diff toxin NEGATIVE NEGATIVE Final   C Diff interpretation Negative for C. difficile  Final  Gastrointestinal Panel by PCR , Stool     Status: Abnormal   Collection Time: 02/09/16  3:42 AM  Result Value Ref Range Status   Campylobacter species NOT DETECTED NOT DETECTED Final   Plesimonas shigelloides NOT DETECTED NOT DETECTED Final   Salmonella species NOT DETECTED NOT DETECTED Final   Yersinia enterocolitica NOT DETECTED NOT DETECTED Final   Vibrio species NOT DETECTED NOT DETECTED Final   Vibrio cholerae NOT DETECTED NOT DETECTED Final   Enteroaggregative E coli (EAEC) NOT DETECTED NOT DETECTED Final   Enteropathogenic E coli (EPEC) NOT DETECTED NOT DETECTED Final   Enterotoxigenic E coli (ETEC) NOT DETECTED NOT DETECTED Final   Shiga like toxin producing E coli (STEC) NOT DETECTED NOT DETECTED Final   E. coli O157 NOT DETECTED NOT DETECTED Final   Shigella/Enteroinvasive E coli  (EIEC) NOT DETECTED NOT DETECTED Final   Cryptosporidium NOT DETECTED NOT DETECTED Final   Cyclospora cayetanensis NOT DETECTED NOT DETECTED Final   Entamoeba histolytica NOT DETECTED NOT DETECTED Final   Giardia lamblia NOT DETECTED NOT DETECTED Final   Adenovirus F40/41 NOT DETECTED NOT DETECTED Final   Astrovirus NOT DETECTED NOT DETECTED Final   Norovirus GI/GII DETECTED (A) NOT DETECTED Final    Comment: CRITICAL RESULT CALLED TO, READ BACK BY AND VERIFIED WITH: LIZ GANNON AT [id number] ON 02/09/16. CTJ    Rotavirus A NOT DETECTED NOT DETECTED Final   Sapovirus (I, II, IV, and V) NOT DETECTED NOT DETECTED Final    RADIOLOGY:  Ct Abdomen Pelvis Wo Contrast  02/09/2016  CLINICAL DATA:  Generalized abdominal pain.  Vomiting. EXAM: CT ABDOMEN AND PELVIS WITHOUT CONTRAST TECHNIQUE: Multidetector CT imaging of the abdomen and pelvis was performed following the standard protocol without IV contrast. COMPARISON:  02/02/2010 FINDINGS: Lower chest: Heart is mildly enlarged. Densely calcified aortic valve and distal thoracic aorta. Linear dependent atelectasis in the lung bases. No effusions. Hepatobiliary: Unremarkable unenhanced appearance gallbladder unremarkable. Pancreas: Unremarkable unenhanced appearance Spleen: Spleen is enlarged with a craniocaudal length of 16 cm. No focal abnormality. Adrenals/Urinary Tract: Scattered low-density lesions within the kidneys difficult to characterize on this unenhanced study but most likely small cysts. 7 mm right renal pelvic stone. Vascular calcifications elsewhere in the hila bilaterally. No ureteral stones or hydronephrosis. Urinary bladder and adrenal glands are unremarkable. Stomach/Bowel: Scattered sigmoid and right colonic diverticulosis. No active diverticulitis. Stomach and small bowel are decompressed, grossly unremarkable. Vascular/Lymphatic: Diffuse aortic and branch vessel calcifications. Slight dilatation of the proximal to mid abdominal aorta, 3.3 cm.  Reproductive: Prior hysterectomy.  No adnexal masses. Other: No free fluid or free air. Musculoskeletal: No acute bony abnormality or focal bone lesion. Degenerative changes in the lumbar spine. IMPRESSION: Mild cardiomegaly.  Bibasilar atelectasis. Splenomegaly. Scattered colonic diverticulosis.  No active diverticulitis. Aortic atherosclerosis. Electronically Signed   By: Rolm Baptise M.D.   On: 02/09/2016 10:27    EKG:   Orders placed or performed in visit on 01/17/12  . EKG 12-Lead      Management plans discussed with the patient, family and they are in agreement.  CODE STATUS: Full.    Code Status Orders        Start     Ordered   02/09/16 1043  Full code   Continuous     02/09/16  1042    Code Status History    Date Active Date Inactive Code Status Order ID Comments User Context   This patient has a current code status but no historical code status.      TOTAL TIME TAKING CARE OF THIS PATIENT: 35 minutes.    Vaughan Basta M.D on 02/10/2016 at 12:22 PM  Between 7am to 6pm - Pager - [phone]  After 6pm go to [email or web address] - password [email or web address] [facility]  Office  [phone]  CC: Primary care physician; No primary care provider on file.   Note: This dictation was prepared with Dragon dictation along with smaller phrase technology. Any transcriptional errors that result from this process are unintentional.

## 2016-02-10 NOTE — Progress Notes (Signed)
Anticoagulation Monitoring  Patient is a 81 yo female admitted with gastroenteritis. Patient with orders for Lovenox 30 mg subq q24h.  Orders adjusted yesterday for CrCl<30 mL/min.  Est CrCl today of 35.5 mL/min.  Per anticoagulation policy, will transition patient to Lovenox 40 mg subq q24h.    Pharmacy will continue to monitor per policy.   Murrell Converse, PharmD Clinical Pharmacist 02/10/2016

## 2016-02-10 NOTE — Evaluation (Signed)
Physical Therapy Evaluation Patient Details Name: Veronica Hubbard MRN: [id number] DOB: Jan 24, 1935 Today's Date: 02/10/2016   History of Present Illness  Patient is a pleasant 81 y/o female that presents from home with nausea, vomiting, abdominal pain, found to have norovirus.   Clinical Impression  Patient is a pleasant 81 y/o who contracted the norovirus, admitted with abdominal pain. She reports she snuck out of bed to use the restroom last evening, and demonstrates no physical assistance needs in this session. She does demonstrate decreased balance from her baseline as she uses the IV pole and furniture cruises during this session, no overt loss of balance identified. PT counseled patient to use RW while at home, though unclear whether or not she will comply. Otherwise patient appears at or near her mobility baseline, would benefit from HHPT to address residual balance deficits.     Follow Up Recommendations Home health PT    Equipment Recommendations  Rolling walker with 5" wheels    Recommendations for Other Services       Precautions / Restrictions Precautions Precautions: Fall Restrictions Weight Bearing Restrictions: No      Mobility  Bed Mobility Overal bed mobility: Needs Assistance Bed Mobility: Supine to Hubbard     Supine to Hubbard: Supervision     General bed mobility comments: Patient requires prolonged time to complete transfer, but no loss of balance noticed.   Transfers Overall transfer level: Needs assistance Equipment used: Rolling walker (2 wheeled) Transfers: Hubbard to/from Stand Hubbard to Stand: Supervision         General transfer comment: Patient transferred Hubbard to stand with no assistance, though she did reach out for IV pole for balance assistance.   Ambulation/Gait Ambulation/Gait assistance: Supervision Ambulation Distance (Feet): 45 Feet Assistive device: 2 person hand held assist Gait Pattern/deviations: Trunk flexed   Gait velocity  interpretation: Below normal speed for age/gender General Gait Details: Patient reports she ambulates at baseline with trunk flexed due to her spinal stenosis. She reports pain in her L LE with ambulation at times, though not today. She prefers to ambulate with IV pole in hand, occasional use of bed rails for 2nd HHA. No overt loss of balance, patient educated to use RW at home given balance deficits identified.   Stairs            Wheelchair Mobility    Modified Rankin (Stroke Patients Only)       Balance Overall balance assessment: Needs assistance Sitting-balance support: No upper extremity supported Sitting balance-Leahy Scale: Good     Standing balance support: Bilateral upper extremity supported Standing balance-Leahy Scale: Fair                               Pertinent Vitals/Pain Pain Assessment: No/denies pain    Home Living Family/patient expects to be discharged to:: Private residence Living Arrangements: Alone Available Help at Discharge:  (Unclear what assistance she will have) Type of Home: House Home Access: Stairs to enter Entrance Stairs-Rails: Can reach both Entrance Stairs-Number of Steps: 2 Home Layout: Two level Home Equipment: Walker - 2 wheels      Prior Function Level of Independence: Independent         Comments: Per patient she furniture cruises and intermittently uses RW. She has had falls, though these appear to be while she was out gardening, she reports never in the house.     Hand Dominance  Extremity/Trunk Assessment   Upper Extremity Assessment: Overall WFL for tasks assessed           Lower Extremity Assessment: Overall WFL for tasks assessed         Communication   Communication: No difficulties  Cognition Arousal/Alertness: Awake/alert Behavior During Therapy: WFL for tasks assessed/performed Overall Cognitive Status: Within Functional Limits for tasks assessed                       General Comments      Exercises        Assessment/Plan    PT Assessment Patient needs continued PT services  PT Diagnosis Difficulty walking;Generalized weakness   PT Problem List Decreased strength;Decreased knowledge of use of DME;Decreased safety awareness;Decreased balance;Decreased mobility  PT Treatment Interventions DME instruction;Gait training;Stair training;Therapeutic activities;Therapeutic exercise;Balance training   PT Goals (Current goals can be found in the Care Plan section) Acute Rehab PT Goals Patient Stated Goal: To return home  PT Goal Formulation: With patient Time For Goal Achievement: 02/24/16 Potential to Achieve Goals: Good    Frequency Min 2X/week   Barriers to discharge Decreased caregiver support Patient lives alone, unclear what support system she has in place.     Co-evaluation               End of Session Equipment Utilized During Treatment: Gait belt Activity Tolerance: Patient tolerated treatment well Patient left: in chair;with call bell/phone within reach;with chair alarm set Nurse Communication: Mobility status    Functional Assessment Tool Used: Clinical judgement  Functional Limitation: Mobility: Walking and moving around Mobility: Walking and Moving Around Current Status [id number]): At least 1 percent but less than 20 percent impaired, limited or restricted Mobility: Walking and Moving Around Goal Status [phone]): At least 1 percent but less than 20 percent impaired, limited or restricted    Time: [id number] PT Time Calculation (min) (ACUTE ONLY): 15 min   Charges:   PT Evaluation $PT Eval Moderate Complexity: 1 Procedure     PT G Codes:   PT G-Codes **NOT FOR INPATIENT CLASS** Functional Assessment Tool Used: Clinical judgement  Functional Limitation: Mobility: Walking and moving around Mobility: Walking and Moving Around Current Status [id number]): At least 1 percent but less than 20 percent impaired, limited or  restricted Mobility: Walking and Moving Around Goal Status [phone]): At least 1 percent but less than 20 percent impaired, limited or restricted   Kerman Passey, PT, DPT    02/10/2016, 12:38 PM

## 2016-02-10 NOTE — Progress Notes (Signed)
Patient discharged home with family.  All discharge instructions reviewed and discharge paperwork given to patient.  Patient verbalized understanding.  IV removed in tact. All questions and concerns addressed. Patient's brother at bedside for transfer home.

## 2016-02-11 NOTE — Care Management (Signed)
Patient discharge 02/10/16.  History obtained via telephone.  Patient lives at home alone.  Obtains her medication from [facility].  Patient states that her sister provides her transportation when needed.  Home health PT has been ordered. Patient states that she does not have a preference of agency.  Referral [facility] to [facility].  Corene Cornea with [facility] notified.  RW ordered.  To be delivered to home.  RNCM signing off

## 2016-02-22 ENCOUNTER — Other Ambulatory Visit: Payer: Self-pay | Admitting: Physician Assistant

## 2016-02-22 DIAGNOSIS — Z1231 Encounter for screening mammogram for malignant neoplasm of breast: Secondary | ICD-10-CM

## 2016-02-23 ENCOUNTER — Observation Stay
Admission: EM | Admit: 2016-02-23 | Discharge: 2016-02-24 | Disposition: A | Discharge status: Home / Self Care | Payer: Medicare HMO | Attending: Internal Medicine | Admitting: Internal Medicine

## 2016-02-23 ENCOUNTER — Emergency Department: Payer: Medicare HMO

## 2016-02-23 ENCOUNTER — Encounter: Payer: Self-pay | Admitting: Emergency Medicine

## 2016-02-23 DIAGNOSIS — I712 Thoracic aortic aneurysm, without rupture: Secondary | ICD-10-CM | POA: Insufficient documentation

## 2016-02-23 DIAGNOSIS — R0602 Shortness of breath: Secondary | ICD-10-CM | POA: Diagnosis not present

## 2016-02-23 DIAGNOSIS — Z886 Allergy status to analgesic agent status: Secondary | ICD-10-CM | POA: Diagnosis not present

## 2016-02-23 DIAGNOSIS — Z885 Allergy status to narcotic agent status: Secondary | ICD-10-CM | POA: Insufficient documentation

## 2016-02-23 DIAGNOSIS — Z888 Allergy status to other drugs, medicaments and biological substances status: Secondary | ICD-10-CM | POA: Diagnosis not present

## 2016-02-23 DIAGNOSIS — F329 Major depressive disorder, single episode, unspecified: Secondary | ICD-10-CM | POA: Insufficient documentation

## 2016-02-23 DIAGNOSIS — Z91041 Radiographic dye allergy status: Secondary | ICD-10-CM | POA: Insufficient documentation

## 2016-02-23 DIAGNOSIS — Z79899 Other long term (current) drug therapy: Secondary | ICD-10-CM | POA: Insufficient documentation

## 2016-02-23 DIAGNOSIS — J45909 Unspecified asthma, uncomplicated: Secondary | ICD-10-CM | POA: Diagnosis not present

## 2016-02-23 DIAGNOSIS — R001 Bradycardia, unspecified: Secondary | ICD-10-CM | POA: Diagnosis not present

## 2016-02-23 DIAGNOSIS — Z90710 Acquired absence of both cervix and uterus: Secondary | ICD-10-CM | POA: Diagnosis not present

## 2016-02-23 DIAGNOSIS — R110 Nausea: Secondary | ICD-10-CM | POA: Diagnosis not present

## 2016-02-23 DIAGNOSIS — E039 Hypothyroidism, unspecified: Secondary | ICD-10-CM | POA: Diagnosis not present

## 2016-02-23 DIAGNOSIS — I714 Abdominal aortic aneurysm, without rupture: Secondary | ICD-10-CM | POA: Diagnosis not present

## 2016-02-23 DIAGNOSIS — M47814 Spondylosis without myelopathy or radiculopathy, thoracic region: Secondary | ICD-10-CM | POA: Insufficient documentation

## 2016-02-23 DIAGNOSIS — K219 Gastro-esophageal reflux disease without esophagitis: Secondary | ICD-10-CM | POA: Insufficient documentation

## 2016-02-23 DIAGNOSIS — Z7902 Long term (current) use of antithrombotics/antiplatelets: Secondary | ICD-10-CM | POA: Diagnosis not present

## 2016-02-23 DIAGNOSIS — J439 Emphysema, unspecified: Secondary | ICD-10-CM | POA: Insufficient documentation

## 2016-02-23 DIAGNOSIS — N200 Calculus of kidney: Secondary | ICD-10-CM | POA: Insufficient documentation

## 2016-02-23 DIAGNOSIS — I350 Nonrheumatic aortic (valve) stenosis: Secondary | ICD-10-CM | POA: Diagnosis not present

## 2016-02-23 DIAGNOSIS — Z87891 Personal history of nicotine dependence: Secondary | ICD-10-CM | POA: Insufficient documentation

## 2016-02-23 DIAGNOSIS — E785 Hyperlipidemia, unspecified: Secondary | ICD-10-CM | POA: Insufficient documentation

## 2016-02-23 DIAGNOSIS — I200 Unstable angina: Secondary | ICD-10-CM | POA: Diagnosis present

## 2016-02-23 DIAGNOSIS — I440 Atrioventricular block, first degree: Secondary | ICD-10-CM | POA: Insufficient documentation

## 2016-02-23 DIAGNOSIS — Z7951 Long term (current) use of inhaled steroids: Secondary | ICD-10-CM | POA: Diagnosis not present

## 2016-02-23 DIAGNOSIS — K5730 Diverticulosis of large intestine without perforation or abscess without bleeding: Secondary | ICD-10-CM | POA: Diagnosis not present

## 2016-02-23 DIAGNOSIS — I10 Essential (primary) hypertension: Secondary | ICD-10-CM | POA: Insufficient documentation

## 2016-02-23 DIAGNOSIS — Z8249 Family history of ischemic heart disease and other diseases of the circulatory system: Secondary | ICD-10-CM | POA: Insufficient documentation

## 2016-02-23 DIAGNOSIS — Z806 Family history of leukemia: Secondary | ICD-10-CM | POA: Diagnosis not present

## 2016-02-23 DIAGNOSIS — R0789 Other chest pain: Principal | ICD-10-CM | POA: Insufficient documentation

## 2016-02-23 DIAGNOSIS — E871 Hypo-osmolality and hyponatremia: Secondary | ICD-10-CM | POA: Diagnosis present

## 2016-02-23 DIAGNOSIS — Z833 Family history of diabetes mellitus: Secondary | ICD-10-CM | POA: Diagnosis not present

## 2016-02-23 DIAGNOSIS — M4800 Spinal stenosis, site unspecified: Secondary | ICD-10-CM | POA: Insufficient documentation

## 2016-02-23 DIAGNOSIS — R079 Chest pain, unspecified: Secondary | ICD-10-CM | POA: Diagnosis present

## 2016-02-23 LAB — COMPREHENSIVE METABOLIC PANEL
ALT: 11 U/L — ABNORMAL LOW (ref 14–54)
AST: 23 U/L (ref 15–41)
Albumin: 4.3 g/dL (ref 3.5–5.0)
Alkaline Phosphatase: 50 U/L (ref 38–126)
Anion gap: 8 (ref 5–15)
BUN: 14 mg/dL (ref 6–20)
CHLORIDE: 105 mmol/L (ref 101–111)
CO2: 27 mmol/L (ref 22–32)
Calcium: 10.7 mg/dL — ABNORMAL HIGH (ref 8.9–10.3)
Creatinine, Ser: 1.14 mg/dL — ABNORMAL HIGH (ref 0.44–1.00)
GFR, EST AFRICAN AMERICAN: 52 mL/min — AB (ref >60)
GFR, EST NON AFRICAN AMERICAN: 45 mL/min — AB (ref >60)
Glucose, Bld: 102 mg/dL — ABNORMAL HIGH (ref 65–99)
POTASSIUM: 4 mmol/L (ref 3.5–5.1)
SODIUM: 140 mmol/L (ref 135–145)
Total Bilirubin: 0.6 mg/dL (ref 0.3–1.2)
Total Protein: 7.1 g/dL (ref 6.5–8.1)

## 2016-02-23 LAB — CBC
HCT: 37.5 % (ref 35.0–47.0)
Hemoglobin: 12.3 g/dL (ref 12.0–16.0)
MCH: 25.9 pg — AB (ref 26.0–34.0)
MCHC: 32.9 g/dL (ref 32.0–36.0)
MCV: 78.6 fL — AB (ref 80.0–100.0)
PLATELETS: 383 10*3/uL (ref 150–440)
RBC: 4.76 MIL/uL (ref 3.80–5.20)
RDW: 21.3 % — AB (ref 11.5–14.5)
WBC: 17.5 10*3/uL — AB (ref 3.6–11.0)

## 2016-02-23 LAB — TROPONIN I

## 2016-02-23 MED ORDER — NITROGLYCERIN IN D5W 200-5 MCG/ML-% IV SOLN
10.0000 ug/min | INTRAVENOUS | Status: DC
  Administered 2016-02-23: 5 ug/min via INTRAVENOUS
  Filled 2016-02-23: qty 250

## 2016-02-23 MED ORDER — ONDANSETRON HCL 4 MG/2ML IJ SOLN
4.0000 mg | Freq: Once | INTRAMUSCULAR | Status: AC
  Administered 2016-02-23: 4 mg via INTRAVENOUS
  Filled 2016-02-23: qty 2

## 2016-02-23 MED ORDER — FENTANYL CITRATE (PF) 100 MCG/2ML IJ SOLN
25.0000 ug | Freq: Once | INTRAMUSCULAR | Status: AC
  Administered 2016-02-23: 25 ug via INTRAVENOUS
  Filled 2016-02-23: qty 2

## 2016-02-23 NOTE — ED Notes (Signed)
Intermittent chest pain since 1 pm today, began while sewing

## 2016-02-23 NOTE — ED Notes (Signed)
Nitro drip d/c to MD order - called by unit Network engineer.

## 2016-02-23 NOTE — ED Notes (Signed)
Pt transferred to room 234 

## 2016-02-23 NOTE — ED Provider Notes (Signed)
CSN: [id number]     Arrival date & time 02/23/16  60 History   First MD Initiated Contact with Patient 02/23/16 1743     Chief Complaint  Patient presents with  . Chest Pain     (Consider location/radiation/quality/duration/timing/severity/associated sxs/prior Treatment) The history is provided by the patient.  Veronica Hubbard is a 81 y.o. female hx of HTN, hypothyroidism, GERD, depression, Here presenting with chest pain. Chest pain since 1 PM afternoon. The sewing at the time and she said some chest pressure that is substernal. She then walked upstairs And the pain got worse so she sat down. She then became diaphoretic at that time. States that she does have some back pain associated with the chest pain and some shortness of breath. She does see Dr. Nehemiah Massed from cardiology but does not have any cardiac stents. Patient has renal insufficiency from IV contrast previously and has hx of thoracic and aortic aneurysms.  Given 3 nitros by EMS, still having 5/10 pain and pressure. Has allergic reaction to ASA    Past Medical History  Diagnosis Date  . Hypertension   . Asthma   . Hypothyroidism   . Depression   . GERD (gastroesophageal reflux disease)   . Spinal stenosis    Past Surgical History  Procedure Laterality Date  . Abdominal hysterectomy    . Cataracts    . Back surgery    . Shoulder surgery    . Lipoma removal     Family History  Problem Relation Age of Onset  . Leukemia Mother   . CAD Father   . Diabetes Father    Social History  Substance Use Topics  . Smoking status: Former Smoker    Types: Cigarettes  . Smokeless tobacco: None  . Alcohol Use: No   OB History    No data available     Review of Systems  Cardiovascular: Positive for chest pain.  All other systems reviewed and are negative.     Allergies  Aspirin; Celecoxib; Iodinated diagnostic agents; Losartan; Morphine; and Verapamil  Home Medications   Prior to Admission medications    Medication Sig Start Date End Date Taking? Authorizing Provider  albuterol (PROVENTIL HFA;VENTOLIN HFA) 108 (90 Base) MCG/ACT inhaler Inhale 2 puffs into the lungs every 4 (four) hours as needed for wheezing or shortness of breath.    Historical Provider, MD  citalopram (CELEXA) 20 MG tablet Take 20 mg by mouth daily.    Historical Provider, MD  ezetimibe (ZETIA) 10 MG tablet Take 10 mg by mouth daily.    Historical Provider, MD  isosorbide mononitrate (IMDUR) 30 MG 24 hr tablet Take 30 mg by mouth daily.    Historical Provider, MD  levothyroxine (SYNTHROID, LEVOTHROID) 100 MCG tablet Take 100 mcg by mouth daily before breakfast.    Historical Provider, MD  omega-3 acid ethyl esters (LOVAZA) 1 g capsule Take 1 g by mouth daily.    Historical Provider, MD  pantoprazole (PROTONIX) 40 MG tablet Take 40 mg by mouth 2 (two) times daily.    Historical Provider, MD  valsartan (DIOVAN) 80 MG tablet Take 80 mg by mouth daily.    Historical Provider, MD   BP 188/87 mmHg  Pulse 54  Temp(Src) 97.6 F (36.4 C) (Oral)  Resp 18  Ht 5\' 3"  (1.6 m)  Wt 170 lb (77.111 kg)  BMI 30.12 kg/m2  SpO2 98% Physical Exam  Constitutional: She is oriented to person, place, and time.  Uncomfortable   HENT:  Head: Normocephalic.  Mouth/Throat: Oropharynx is clear and moist.  Eyes: Conjunctivae are normal. Pupils are equal, round, and reactive to light.  Neck: Normal range of motion. Neck supple.  Cardiovascular: Normal rate, regular rhythm and normal heart sounds.   Pulmonary/Chest: Effort normal and breath sounds normal.  + reproducible tenderness sternal area   Abdominal: Soft. Bowel sounds are normal. She exhibits no distension. There is no tenderness. There is no rebound.  Musculoskeletal: Normal range of motion. She exhibits no edema or tenderness.  Neurological: She is alert and oriented to person, place, and time. No cranial nerve deficit. Coordination normal.  Skin: Skin is warm.  Psychiatric: She has a  normal mood and affect. Her behavior is normal. Judgment and thought content normal.  Nursing note and vitals reviewed.   ED Course  Procedures (including critical care time)  CRITICAL CARE Performed by: Darl Householder, DAVID   Total critical care time: 30 minutes  Critical care time was exclusive of separately billable procedures and treating other patients.  Critical care was necessary to treat or prevent imminent or life-threatening deterioration.  Critical care was time spent personally by me on the following activities: development of treatment plan with patient and/or surrogate as well as nursing, discussions with consultants, evaluation of patient's response to treatment, examination of patient, obtaining history from patient or surrogate, ordering and performing treatments and interventions, ordering and review of laboratory studies, ordering and review of radiographic studies, pulse oximetry and re-evaluation of patient's condition.   Labs Review Labs Reviewed  CBC - Abnormal; Notable for the following:    WBC 17.5 (*)    MCV 78.6 (*)    MCH 25.9 (*)    RDW 21.3 (*)    All other components within normal limits  COMPREHENSIVE METABOLIC PANEL - Abnormal; Notable for the following:    Glucose, Bld 102 (*)    Creatinine, Ser 1.14 (*)    Calcium 10.7 (*)    ALT 11 (*)    GFR calc non Af Amer 45 (*)    GFR calc Af Amer 52 (*)    All other components within normal limits  TROPONIN I    Imaging Review Ct Abdomen Pelvis Wo Contrast  02/23/2016  CLINICAL DATA:  Severe mediastinal chest pain beginning this afternoon. Thoracic and abdominal aortic aneurysm. Evaluate for aneurysm rupture. EXAM: CT CHEST, ABDOMEN AND PELVIS WITHOUT CONTRAST TECHNIQUE: Multidetector CT imaging of the chest, abdomen and pelvis was performed following the standard protocol without IV contrast. COMPARISON:  Chest CT on 12/05/2013 and AP CT on 02/09/2016 FINDINGS: CT CHEST FINDINGS Mediastinum/Lymph Nodes: No  masses or pathologically enlarged lymph nodes identified on this un-enhanced exam. Coronary artery calcification noted. Diffuse atherosclerotic calcifications seen involving the thoracic aorta. Ascending thoracic aorta measures 3.7 cm in maximum diameter. Ectasia of the aortic arch noted. There is mild dilatation of the descending thoracic aorta measuring 4.1 cm on image 39/series 7 which is unchanged since previous study. No evidence of aneurysm leak or rupture. No evidence of mediastinal hematoma. Lungs/Pleura: No pulmonary mass, infiltrate, or effusion. Mild emphysema noted. Musculoskeletal: No chest wall mass or suspicious bone lesions identified. CT ABDOMEN PELVIS FINDINGS Hepatobiliary: No mass visualized on this un-enhanced exam. Gallbladder is unremarkable. Pancreas: No mass or inflammatory process identified on this un-enhanced exam. Spleen: Within normal limits in size. Adrenals/Urinary Tract: Both adrenal glands are normal in appearance. Probable small fluid attenuation cysts again noted both kidneys. A nonobstructive calculus is noted in the right renal pelvis measuring  7 mm. Other tiny less than 5 mm calculi noted in the left kidney. No evidence of ureteral calculi or hydronephrosis. Urinary bladder is distended but otherwise unremarkable in appearance. Stomach/Bowel: No evidence of obstruction, inflammatory process, or abnormal fluid collections. Diverticulosis is seen involving the descending and sigmoid portions of the colon, however there is no evidence of diverticulitis. Vascular/Lymphatic: No pathologically enlarged lymph nodes. Infrarenal abdominal aortic aneurysm is seen measuring 3.4 cm maximum diameter compared to 3.3 cm previously. No evidence of aneurysm leak or rupture. Diffuse atherosclerotic calcification noted. Reproductive: Prior hysterectomy noted. Adnexal regions are unremarkable in appearance. Other: None. Musculoskeletal:  No suspicious bone lesions identified. 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 No evidence of aneurysm leak or rupture. Recommend follow up by [place] in 3years. This recommendation follows ACR consensus guidelines: White Paper of the ACR Incidental Findings Committee II on Vascular Findings. Joellyn Rued [id number]. Mild emphysema.  No active lung disease. Colonic diverticulosis. No radiographic evidence of diverticulitis. Bilateral nephrolithiasis. No evidence of ureteral calculi or hydronephrosis. Distended urinary bladder incidentally noted. Recommend clinical correlation for urinary retention. Electronically Signed   By: Earle Gell M.D.   On: 02/23/2016 19:33   Dg Chest 2 View  02/23/2016  CLINICAL DATA:  Intermittent chest pain since 1 p.m. EXAM: CHEST  2 VIEW COMPARISON:  12/05/2013 FINDINGS: Tortuous and ectatic thoracic aorta with atherosclerotic calcification. Heart size within normal limits. Thoracic spondylosis. Linear subsegmental atelectasis or scarring along the lingula. No pleural effusion. Mild thoracic spondylosis. IMPRESSION: 1. Tortuous and ectatic thoracic aorta as shown on multiple prior exams. No acute radiographic findings. Electronically Signed   By: Van Clines M.D.   On: 02/23/2016 18:19   Ct Chest Wo Contrast  02/23/2016  CLINICAL DATA:  Severe mediastinal chest pain beginning this afternoon. Thoracic and abdominal aortic aneurysm. Evaluate for aneurysm rupture. EXAM: CT CHEST, ABDOMEN AND PELVIS WITHOUT CONTRAST TECHNIQUE: Multidetector CT imaging of the chest, abdomen and pelvis was performed following the standard protocol without IV contrast. COMPARISON:  Chest CT on 12/05/2013 and AP CT on 02/09/2016 FINDINGS: CT CHEST FINDINGS Mediastinum/Lymph Nodes: No masses or pathologically enlarged lymph nodes identified on this un-enhanced exam. Coronary artery calcification noted. Diffuse atherosclerotic  calcifications seen involving the thoracic aorta. Ascending thoracic aorta measures 3.7 cm in maximum diameter. Ectasia of the aortic arch noted. There is mild dilatation of the descending thoracic aorta measuring 4.1 cm on image 39/series 7 which is unchanged since previous study. No evidence of aneurysm leak or rupture. No evidence of mediastinal hematoma. Lungs/Pleura: No pulmonary mass, infiltrate, or effusion. Mild emphysema noted. Musculoskeletal: No chest wall mass or suspicious bone lesions identified. CT ABDOMEN PELVIS FINDINGS Hepatobiliary: No mass visualized on this un-enhanced exam. Gallbladder is unremarkable. Pancreas: No mass or inflammatory process identified on this un-enhanced exam. Spleen: Within normal limits in size. Adrenals/Urinary Tract: Both adrenal glands are normal in appearance. Probable small fluid attenuation cysts again noted both kidneys. A nonobstructive calculus is noted in the right renal pelvis measuring 7 mm. Other tiny less than 5 mm calculi noted in the left kidney. No evidence of ureteral calculi or hydronephrosis. Urinary bladder is distended but otherwise unremarkable in appearance. Stomach/Bowel: No evidence of obstruction, inflammatory process, or abnormal fluid collections. Diverticulosis is seen involving the descending and sigmoid portions of the colon, however there is no evidence of diverticulitis. Vascular/Lymphatic: No pathologically enlarged lymph nodes.  Infrarenal abdominal aortic aneurysm is seen measuring 3.4 cm maximum diameter compared to 3.3 cm previously. No evidence of aneurysm leak or rupture. Diffuse atherosclerotic calcification noted. Reproductive: Prior hysterectomy noted. Adnexal regions are unremarkable in appearance. Other: None. Musculoskeletal:  No suspicious bone lesions identified. 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 No evidence of  aneurysm leak or rupture. Recommend follow up by [place] in 3years. This recommendation follows ACR consensus guidelines: White Paper of the ACR Incidental Findings Committee II on Vascular Findings. Joellyn Rued [id number]. Mild emphysema.  No active lung disease. Colonic diverticulosis. No radiographic evidence of diverticulitis. Bilateral nephrolithiasis. No evidence of ureteral calculi or hydronephrosis. Distended urinary bladder incidentally noted. Recommend clinical correlation for urinary retention. Electronically Signed   By: Earle Gell M.D.   On: 02/23/2016 19:33   I have personally reviewed and evaluated these images and lab results as part of my medical decision-making.   EKG Interpretation None       ED ECG REPORT I, YAO, DAVID, the attending physician, personally viewed and interpreted this ECG.   Date: 02/23/2016  EKG Time: 17:36  Rate: 54  Rhythm: normal EKG, normal sinus rhythm  Axis: normal  Intervals:none  ST&T Change: nonspecific    MDM   Final diagnoses:  None   Veronica Hubbard is a 81 y.o. female here with chest pain, hypertension. I am concerned for unstable angina vs ACS. She has hx of aortic aneurysms so I consider rupture vs dissection as well. However, she has IV dye allergy so will not be able to give her IV contrast to r/o dissection. Will get CXR and if abnormal, can get noncontrast CT.   7:47 PM WBC 17. CT showed stable aneurysms and no obvious rupture. Still has pain despite fentanyl. Still hypertensive to 180s. Started on nitro drip for unstable angina and for BP control.      Wandra Arthurs, MD 02/23/16 [phone]

## 2016-02-23 NOTE — ED Notes (Signed)
Nitro titrated to 44mcg/min, prior BP was 184/65

## 2016-02-23 NOTE — H&P (Signed)
[facility] NAME: Veronica Hubbard    MR#:  [id number]  DATE OF BIRTH:  [birth year]  DATE OF ADMISSION:  02/23/2016  PRIMARY CARE PHYSICIAN: Antionette Fairy, PA-C   REQUESTING/REFERRING PHYSICIAN: Dr. Shirlyn Goltz  CHIEF COMPLAINT:   Chief Complaint  Patient presents with  . Chest Pain    HISTORY OF PRESENT ILLNESS:  Veronica Hubbard  is a 81 y.o. female with a known history of hypertension, asthma, hypothyroidism, depression, GERD, spinal stenosis who presents to the hospital due to chest pain. She was recently hospitalized at for a viral gastroenteritis and discharge and was doing well until today she woke up was having significant substernal chest pain radiating to her left side into her back. It was associated with shortness of breath and nausea but no vomiting. She denied any diaphoresis, palpitations or syncope associated with chest pain. Patient says that she's had previous episodes of chest pain before but they haven't lasted this long and therefore she was concerned and came to the ER for further evaluation. Patient's EKG did not show any acute ST-T wave changes in her first set of cardiac markers were negative. Hospitalist services were contacted further treatment and evaluation.  PAST MEDICAL HISTORY:   Past Medical History  Diagnosis Date  . Hypertension   . Asthma   . Hypothyroidism   . Depression   . GERD (gastroesophageal reflux disease)   . Spinal stenosis     PAST SURGICAL HISTORY:   Past Surgical History  Procedure Laterality Date  . Abdominal hysterectomy    . Cataracts    . Back surgery    . Shoulder surgery    . Lipoma removal      SOCIAL HISTORY:   Social History  Substance Use Topics  . Smoking status: Former Smoker    Types: Cigarettes  . Smokeless tobacco: Not on file  . Alcohol Use: No    FAMILY HISTORY:   Family History  Problem Relation Age of Onset  . Leukemia Mother   . CAD Father    . Diabetes Father     DRUG ALLERGIES:   Allergies  Allergen Reactions  . Aspirin Shortness Of Breath  . Celecoxib Shortness Of Breath  . Iodinated Diagnostic Agents Other (See Comments)    Reaction:  Decreased kidney function  . Losartan Itching  . Morphine Nausea And Vomiting  . Verapamil Itching    REVIEW OF SYSTEMS:   Review of Systems  Constitutional: Negative for fever and weight loss.  HENT: Negative for congestion, nosebleeds and tinnitus.   Eyes: Negative for blurred vision, double vision and redness.  Respiratory: Negative for cough, hemoptysis and shortness of breath.   Cardiovascular: Positive for chest pain. Negative for orthopnea, leg swelling and PND.  Gastrointestinal: Negative for nausea, vomiting, abdominal pain, diarrhea and melena.  Genitourinary: Negative for dysuria, urgency and hematuria.  Musculoskeletal: Negative for joint pain and falls.  Neurological: Negative for dizziness, tingling, sensory change, focal weakness, seizures, weakness and headaches.  Endo/Heme/Allergies: Negative for polydipsia. Does not bruise/bleed easily.  Psychiatric/Behavioral: Negative for depression and memory loss. The patient is not nervous/anxious.     MEDICATIONS AT HOME:   Prior to Admission medications   Medication Sig Start Date End Date Taking? Authorizing Provider  albuterol (PROVENTIL HFA;VENTOLIN HFA) 108 (90 Base) MCG/ACT inhaler Inhale 2 puffs into the lungs every 4 (four) hours as needed for wheezing or shortness of breath.   Yes Historical Provider, MD  citalopram (CELEXA) 20 MG tablet Take 20 mg by mouth daily.   Yes Historical Provider, MD  ezetimibe (ZETIA) 10 MG tablet Take 10 mg by mouth daily.   Yes Historical Provider, MD  isosorbide mononitrate (IMDUR) 30 MG 24 hr tablet Take 30 mg by mouth daily.   Yes Historical Provider, MD  ketoconazole (NIZORAL) 2 % shampoo Apply 1 application topically 2 (two) times a week.   Yes Historical Provider, MD   levothyroxine (SYNTHROID, LEVOTHROID) 100 MCG tablet Take 100 mcg by mouth daily before breakfast.   Yes Historical Provider, MD  omega-3 acid ethyl esters (LOVAZA) 1 g capsule Take 1 g by mouth at bedtime.    Yes Historical Provider, MD  pantoprazole (PROTONIX) 40 MG tablet Take 40 mg by mouth 2 (two) times daily.   Yes Historical Provider, MD  triamcinolone cream (KENALOG) 0.5 % Apply 1 application topically 2 (two) times daily as needed (for itching/rash).   Yes Historical Provider, MD  valsartan (DIOVAN) 80 MG tablet Take 80 mg by mouth daily.   Yes Historical Provider, MD      VITAL SIGNS:  Blood pressure 153/73, pulse 53, temperature 97.6 F (36.4 C), temperature source Oral, resp. rate 13, height 5\' 3"  (1.6 m), weight 77.111 kg (170 lb), SpO2 100 %.  PHYSICAL EXAMINATION:  Physical Exam  GENERAL:  81 y.o.-year-old patient lying in the bed in no acute distress.  EYES: Pupils equal, round, reactive to light and accommodation. No scleral icterus. Extraocular muscles intact.  HEENT: Head atraumatic, normocephalic. Oropharynx and nasopharynx clear. No oropharyngeal erythema, moist oral mucosa  NECK:  Supple, no jugular venous distention. No thyroid enlargement, no tenderness.  LUNGS: Normal breath sounds bilaterally, no wheezing, rales, rhonchi. No use of accessory muscles of respiration.  CARDIOVASCULAR: S1, S2 RRR. No murmurs, rubs, gallops, clicks.  ABDOMEN: Soft, nontender, nondistended. Bowel sounds present. No organomegaly or mass.  EXTREMITIES: No pedal edema, cyanosis, or clubbing. + 2 pedal & radial pulses b/l.   NEUROLOGIC: Cranial nerves II through XII are intact. No focal Motor or sensory deficits appreciated b/l PSYCHIATRIC: The patient is alert and oriented x 3. Good affect.  SKIN: No obvious rash, lesion, or ulcer.   LABORATORY PANEL:   CBC  Recent Labs Lab 02/23/16 1743  WBC 17.5*  HGB 12.3  HCT 37.5  PLT 383    ------------------------------------------------------------------------------------------------------------------  Chemistries   Recent Labs Lab 02/23/16 1743  NA 140  K 4.0  CL 105  CO2 27  GLUCOSE 102*  BUN 14  CREATININE 1.14*  CALCIUM 10.7*  AST 23  ALT 11*  ALKPHOS 50  BILITOT 0.6   ------------------------------------------------------------------------------------------------------------------  Cardiac Enzymes  Recent Labs Lab 02/23/16 1743  TROPONINI <0.03   ------------------------------------------------------------------------------------------------------------------  RADIOLOGY:  Ct Abdomen Pelvis Wo Contrast  02/23/2016  CLINICAL DATA:  Severe mediastinal chest pain beginning this afternoon. Thoracic and abdominal aortic aneurysm. Evaluate for aneurysm rupture. EXAM: CT CHEST, ABDOMEN AND PELVIS WITHOUT CONTRAST TECHNIQUE: Multidetector CT imaging of the chest, abdomen and pelvis was performed following the standard protocol without IV contrast. COMPARISON:  Chest CT on 12/05/2013 and AP CT on 02/09/2016 FINDINGS: CT CHEST FINDINGS Mediastinum/Lymph Nodes: No masses or pathologically enlarged lymph nodes identified on this un-enhanced exam. Coronary artery calcification noted. Diffuse atherosclerotic calcifications seen involving the thoracic aorta. Ascending thoracic aorta measures 3.7 cm in maximum diameter. Ectasia of the aortic arch noted. There is mild dilatation of the descending thoracic aorta measuring 4.1 cm on image 39/series 7 which is  unchanged since previous study. No evidence of aneurysm leak or rupture. No evidence of mediastinal hematoma. Lungs/Pleura: No pulmonary mass, infiltrate, or effusion. Mild emphysema noted. Musculoskeletal: No chest wall mass or suspicious bone lesions identified. CT ABDOMEN PELVIS FINDINGS Hepatobiliary: No mass visualized on this un-enhanced exam. Gallbladder is unremarkable. Pancreas: No mass or inflammatory process  identified on this un-enhanced exam. Spleen: Within normal limits in size. Adrenals/Urinary Tract: Both adrenal glands are normal in appearance. Probable small fluid attenuation cysts again noted both kidneys. A nonobstructive calculus is noted in the right renal pelvis measuring 7 mm. Other tiny less than 5 mm calculi noted in the left kidney. No evidence of ureteral calculi or hydronephrosis. Urinary bladder is distended but otherwise unremarkable in appearance. Stomach/Bowel: No evidence of obstruction, inflammatory process, or abnormal fluid collections. Diverticulosis is seen involving the descending and sigmoid portions of the colon, however there is no evidence of diverticulitis. Vascular/Lymphatic: No pathologically enlarged lymph nodes. Infrarenal abdominal aortic aneurysm is seen measuring 3.4 cm maximum diameter compared to 3.3 cm previously. No evidence of aneurysm leak or rupture. Diffuse atherosclerotic calcification noted. Reproductive: Prior hysterectomy noted. Adnexal regions are unremarkable in appearance. Other: None. Musculoskeletal:  No suspicious bone lesions identified. 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 No evidence of aneurysm leak or rupture. Recommend follow up by [place] in 3years. This recommendation follows ACR consensus guidelines: White Paper of the ACR Incidental Findings Committee II on Vascular Findings. Joellyn Rued [id number]. Mild emphysema.  No active lung disease. Colonic diverticulosis. No radiographic evidence of diverticulitis. Bilateral nephrolithiasis. No evidence of ureteral calculi or hydronephrosis. Distended urinary bladder incidentally noted. Recommend clinical correlation for urinary retention. Electronically Signed   By: Earle Gell M.D.   On: 02/23/2016 19:33   Dg Chest 2 View  02/23/2016  CLINICAL DATA:  Intermittent chest pain since 1 p.m.  EXAM: CHEST  2 VIEW COMPARISON:  12/05/2013 FINDINGS: Tortuous and ectatic thoracic aorta with atherosclerotic calcification. Heart size within normal limits. Thoracic spondylosis. Linear subsegmental atelectasis or scarring along the lingula. No pleural effusion. Mild thoracic spondylosis. IMPRESSION: 1. Tortuous and ectatic thoracic aorta as shown on multiple prior exams. No acute radiographic findings. Electronically Signed   By: Van Clines M.D.   On: 02/23/2016 18:19   Ct Chest Wo Contrast  02/23/2016  CLINICAL DATA:  Severe mediastinal chest pain beginning this afternoon. Thoracic and abdominal aortic aneurysm. Evaluate for aneurysm rupture. EXAM: CT CHEST, ABDOMEN AND PELVIS WITHOUT CONTRAST TECHNIQUE: Multidetector CT imaging of the chest, abdomen and pelvis was performed following the standard protocol without IV contrast. COMPARISON:  Chest CT on 12/05/2013 and AP CT on 02/09/2016 FINDINGS: CT CHEST FINDINGS Mediastinum/Lymph Nodes: No masses or pathologically enlarged lymph nodes identified on this un-enhanced exam. Coronary artery calcification noted. Diffuse atherosclerotic calcifications seen involving the thoracic aorta. Ascending thoracic aorta measures 3.7 cm in maximum diameter. Ectasia of the aortic arch noted. There is mild dilatation of the descending thoracic aorta measuring 4.1 cm on image 39/series 7 which is unchanged since previous study. No evidence of aneurysm leak or rupture. No evidence of mediastinal hematoma. Lungs/Pleura: No pulmonary mass, infiltrate, or effusion. Mild emphysema noted. Musculoskeletal: No chest wall mass or suspicious bone lesions identified. CT ABDOMEN PELVIS FINDINGS Hepatobiliary: No mass visualized on this un-enhanced exam. Gallbladder is unremarkable. Pancreas: No mass or inflammatory process identified on this un-enhanced exam. Spleen: Within normal limits  in size. Adrenals/Urinary Tract: Both adrenal glands are normal in appearance. Probable small  fluid attenuation cysts again noted both kidneys. A nonobstructive calculus is noted in the right renal pelvis measuring 7 mm. Other tiny less than 5 mm calculi noted in the left kidney. No evidence of ureteral calculi or hydronephrosis. Urinary bladder is distended but otherwise unremarkable in appearance. Stomach/Bowel: No evidence of obstruction, inflammatory process, or abnormal fluid collections. Diverticulosis is seen involving the descending and sigmoid portions of the colon, however there is no evidence of diverticulitis. Vascular/Lymphatic: No pathologically enlarged lymph nodes. Infrarenal abdominal aortic aneurysm is seen measuring 3.4 cm maximum diameter compared to 3.3 cm previously. No evidence of aneurysm leak or rupture. Diffuse atherosclerotic calcification noted. Reproductive: Prior hysterectomy noted. Adnexal regions are unremarkable in appearance. Other: None. Musculoskeletal:  No suspicious bone lesions identified. 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 No evidence of aneurysm leak or rupture. Recommend follow up by [place] in 3years. This recommendation follows ACR consensus guidelines: White Paper of the ACR Incidental Findings Committee II on Vascular Findings. Joellyn Rued [id number]. Mild emphysema.  No active lung disease. Colonic diverticulosis. No radiographic evidence of diverticulitis. Bilateral nephrolithiasis. No evidence of ureteral calculi or hydronephrosis. Distended urinary bladder incidentally noted. Recommend clinical correlation for urinary retention. Electronically Signed   By: Earle Gell M.D.   On: 02/23/2016 19:33     IMPRESSION AND PLAN:   81 year old female with past medical history of retention, asthma, hypothyroid, depression, GERD, spinal stenosis who presents to the hospital due to chest pain.  1. Chest pain-patient's chest pain is very atypical  for angina. -She does have risk factors given her history of tobacco abuse, hypertension. -We will observe her on telemetry, cycle her cardiac markers. I will get a cardiology consult. -To the nuclear medicine stress test in the morning. Patient is allergic to aspirin and therefore start on Plavix, when necessary nitroglycerin, oxygen, morphine. -Continue imdur  2. HTN - cont. Irbesartan, Imdur  3. Hypothyroidism - cont. Synthroid.   4. Hyperlipidemia-continue Zetia.  5. Depression-continue Celexa.  6 GERD-continue Protonix.   All the records are reviewed and case discussed with ED provider. Management plans discussed with the patient, family and they are in agreement.  CODE STATUS: Full  TOTAL TIME TAKING CARE OF THIS PATIENT: 45 minutes.    Henreitta Leber M.D on 02/23/2016 at 9:52 PM  Between 7am to 6pm - Pager - [phone]  After 6pm go to [email or web address] - password [email or web address] [facility]  Office  [phone]  CC: Primary care physician; Antionette Fairy, PA-C

## 2016-02-24 ENCOUNTER — Observation Stay: Payer: Medicare HMO

## 2016-02-24 DIAGNOSIS — R0789 Other chest pain: Secondary | ICD-10-CM | POA: Diagnosis not present

## 2016-02-24 LAB — TROPONIN I
Troponin I: <0.03 ng/mL (ref <0.031)
Troponin I: <0.03 ng/mL (ref <0.031)

## 2016-02-24 LAB — NM MYOCAR MULTI W/SPECT W/WALL MOTION / EF
LVDIAVOL: 66 mL (ref 46–106)
LVSYSVOL: 27 mL
NUC STRESS TID: 0.86
SDS: 0
SRS: 0
SSS: 0

## 2016-02-24 LAB — CBC
HCT: 37 % (ref 35.0–47.0)
HEMOGLOBIN: 12.5 g/dL (ref 12.0–16.0)
MCH: 26.3 pg (ref 26.0–34.0)
MCHC: 33.7 g/dL (ref 32.0–36.0)
MCV: 78 fL — AB (ref 80.0–100.0)
Platelets: 337 10*3/uL (ref 150–440)
RBC: 4.75 MIL/uL (ref 3.80–5.20)
RDW: 21.6 % — ABNORMAL HIGH (ref 11.5–14.5)
WBC: 13.7 10*3/uL — ABNORMAL HIGH (ref 3.6–11.0)

## 2016-02-24 LAB — BASIC METABOLIC PANEL
ANION GAP: 7 (ref 5–15)
BUN: 14 mg/dL (ref 6–20)
CALCIUM: 10.4 mg/dL — AB (ref 8.9–10.3)
CO2: 25 mmol/L (ref 22–32)
Chloride: 107 mmol/L (ref 101–111)
Creatinine, Ser: 1.31 mg/dL — ABNORMAL HIGH (ref 0.44–1.00)
GFR, EST AFRICAN AMERICAN: 44 mL/min — AB (ref >60)
GFR, EST NON AFRICAN AMERICAN: 38 mL/min — AB (ref >60)
GLUCOSE: 104 mg/dL — AB (ref 65–99)
POTASSIUM: 4 mmol/L (ref 3.5–5.1)
Sodium: 139 mmol/L (ref 135–145)

## 2016-02-24 MED ORDER — TECHNETIUM TC 99M SESTAMIBI - CARDIOLITE
28.5300 | Freq: Once | INTRAVENOUS | Status: AC | PRN
  Administered 2016-02-24: 10:00:00 28.53 via INTRAVENOUS

## 2016-02-24 MED ORDER — ONDANSETRON HCL 4 MG PO TABS: 4.0000 mg | ORAL_TABLET | Freq: Four times a day (QID) | ORAL | Status: DC | PRN

## 2016-02-24 MED ORDER — OMEGA-3-ACID ETHYL ESTERS 1 G PO CAPS: 1.0000 g | ORAL_CAPSULE | Freq: Every day | ORAL | Status: DC

## 2016-02-24 MED ORDER — ISOSORBIDE MONONITRATE ER 30 MG PO TB24: 30.0000 mg | ORAL_TABLET | Freq: Every day | ORAL | Status: DC

## 2016-02-24 MED ORDER — NITROGLYCERIN 0.4 MG SL SUBL: 0.4000 mg | SUBLINGUAL_TABLET | SUBLINGUAL | Status: DC | PRN

## 2016-02-24 MED ORDER — ENOXAPARIN SODIUM 40 MG/0.4ML ~~LOC~~ SOLN: 40.0000 mg | SUBCUTANEOUS | Status: DC

## 2016-02-24 MED ORDER — ACETAMINOPHEN 325 MG PO TABS: 650.0000 mg | ORAL_TABLET | Freq: Four times a day (QID) | ORAL | Status: DC | PRN

## 2016-02-24 MED ORDER — MORPHINE SULFATE (PF) 2 MG/ML IV SOLN: 1.0000 mg | INTRAVENOUS | Status: DC | PRN

## 2016-02-24 MED ORDER — ACETAMINOPHEN 650 MG RE SUPP: 650.0000 mg | Freq: Four times a day (QID) | RECTAL | Status: DC | PRN

## 2016-02-24 MED ORDER — LEVOTHYROXINE SODIUM 100 MCG PO TABS: 100.0000 ug | ORAL_TABLET | Freq: Every day | ORAL | Status: DC

## 2016-02-24 MED ORDER — ALBUTEROL SULFATE (2.5 MG/3ML) 0.083% IN NEBU: 3.0000 mL | INHALATION_SOLUTION | RESPIRATORY_TRACT | Status: DC | PRN

## 2016-02-24 MED ORDER — TECHNETIUM TC 99M SESTAMIBI - CARDIOLITE
12.7700 | Freq: Once | INTRAVENOUS | Status: AC | PRN
  Administered 2016-02-24: 12.77 via INTRAVENOUS

## 2016-02-24 MED ORDER — CITALOPRAM HYDROBROMIDE 20 MG PO TABS: 20.0000 mg | ORAL_TABLET | Freq: Every day | ORAL | Status: DC

## 2016-02-24 MED ORDER — PANTOPRAZOLE SODIUM 40 MG PO TBEC: 40.0000 mg | DELAYED_RELEASE_TABLET | Freq: Two times a day (BID) | ORAL | Status: DC

## 2016-02-24 MED ORDER — REGADENOSON 0.4 MG/5ML IV SOLN
0.4000 mg | Freq: Once | INTRAVENOUS | Status: AC
  Administered 2016-02-24: 0.4 mg via INTRAVENOUS

## 2016-02-24 MED ORDER — CLOPIDOGREL BISULFATE 75 MG PO TABS
75.0000 mg | ORAL_TABLET | Freq: Every day | ORAL | Status: DC
  Administered 2016-02-24: 75 mg via ORAL
  Filled 2016-02-24: qty 1

## 2016-02-24 MED ORDER — IRBESARTAN 75 MG PO TABS: 75.0000 mg | ORAL_TABLET | Freq: Every day | ORAL | Status: DC

## 2016-02-24 MED ORDER — ONDANSETRON HCL 4 MG/2ML IJ SOLN: 4.0000 mg | Freq: Four times a day (QID) | INTRAMUSCULAR | Status: DC | PRN

## 2016-02-24 MED ORDER — KETOCONAZOLE 2 % EX SHAM
1.0000 "application " | MEDICATED_SHAMPOO | CUTANEOUS | Status: DC
  Filled 2016-02-24: qty 120

## 2016-02-24 MED ORDER — EZETIMIBE 10 MG PO TABS: 10.0000 mg | ORAL_TABLET | Freq: Every day | ORAL | Status: DC

## 2016-02-24 MED ORDER — SODIUM CHLORIDE 0.9% FLUSH
3.0000 mL | Freq: Two times a day (BID) | INTRAVENOUS | Status: DC
  Administered 2016-02-24: 3 mL via INTRAVENOUS

## 2016-02-24 NOTE — Discharge Instructions (Signed)
Heart healthy diet. °Activity as tolerated. °

## 2016-02-24 NOTE — Consult Note (Signed)
[facility]  CARDIOLOGY CONSULT NOTE  Patient ID: Veronica Hubbard MRN: [id number] DOB/AGE: 79-Nov-1936 79 y.o.  Admit date: 02/23/2016 Referring Physician Dr. Bridgett Larsson Primary Physician [facility] Primary Cardiologist Dr. Nehemiah Massed Reason for Consultation Chest pain  HPI: Patient is a 81 year old female with history of an abdominal aortic aneurysm that is being followed, history of bradycardia, history of mild aortic stenosis who was admitted with midsternal chest pain. She had a functional study done as an outpatient in May of last year where she walked 3 minutes on a Bruce protocol. There were no ischemic changes. Ejection fraction was normal. She is ruled out for myocardial infarction during this admission. Chest CT revealed no pulmonary mass infiltrate or effusion. There was mild emphysema. Her aortic aneurysm was 4.1 x 3.4 cm and was infrarenal. There is no evidence of leak. EKG revealed sinus bradycardia at 54. There were no ischemic changes. Patient was recently admitted with viral gastroenteritis. She awoke from sleep with substernal chest pain radiating to her left side and back. It was associated with nausea. Cardiac markers were negative. EKG showed no ischemia. Her chest pain lasted less than 10 minutes. She has been compliant with her medications including Protonix, zetia, citalopram, isosorbide mononitrate at 30 mg daily, and valsartan 80 mg daily. Per outpatient report, patient was also on aspirin and Plavix. Her symptoms have resolved.  Review of Systems  Constitutional: Negative.   HENT: Negative.   Eyes: Negative.   Respiratory: Positive for shortness of breath.   Cardiovascular: Positive for chest pain.  Gastrointestinal: Positive for heartburn and abdominal pain.  Genitourinary: Negative.   Musculoskeletal: Negative.   Skin: Negative.   Neurological: Negative.   Endo/Heme/Allergies: Negative.   Psychiatric/Behavioral:  Negative for suicidal ideas.    Past Medical History  Diagnosis Date  . Hypertension   . Asthma   . Hypothyroidism   . Depression   . GERD (gastroesophageal reflux disease)   . Spinal stenosis     Family History  Problem Relation Age of Onset  . Leukemia Mother   . CAD Father   . Diabetes Father     Social History   Social History  . Marital Status: Single    Spouse Name: N/A  . Number of Children: N/A  . Years of Education: N/A   Occupational History  . Not on file.   Social History Main Topics  . Smoking status: Former Smoker    Types: Cigarettes  . Smokeless tobacco: Not on file  . Alcohol Use: No  . Drug Use: No  . Sexual Activity: Not on file   Other Topics Concern  . Not on file   Social History Narrative    Past Surgical History  Procedure Laterality Date  . Abdominal hysterectomy    . Cataracts    . Back surgery    . Shoulder surgery    . Lipoma removal       Prescriptions prior to admission  Medication Sig Dispense Refill Last Dose  . albuterol (PROVENTIL HFA;VENTOLIN HFA) 108 (90 Base) MCG/ACT inhaler Inhale 2 puffs into the lungs every 4 (four) hours as needed for wheezing or shortness of breath.   Past Month at Unknown time  . citalopram (CELEXA) 20 MG tablet Take 20 mg by mouth daily.   02/23/2016 at Unknown time  . ezetimibe (ZETIA) 10 MG tablet Take 10 mg by mouth daily.   02/23/2016 at Unknown time  . isosorbide mononitrate (IMDUR) 30 MG  24 hr tablet Take 30 mg by mouth daily.   02/23/2016 at Unknown time  . ketoconazole (NIZORAL) 2 % shampoo Apply 1 application topically 2 (two) times a week.   Past Week at Unknown time  . levothyroxine (SYNTHROID, LEVOTHROID) 100 MCG tablet Take 100 mcg by mouth daily before breakfast.   02/23/2016 at Unknown time  . omega-3 acid ethyl esters (LOVAZA) 1 g capsule Take 1 g by mouth at bedtime.    02/22/2016 at Unknown time  . pantoprazole (PROTONIX) 40 MG tablet Take 40 mg by mouth 2 (two) times daily.    02/23/2016 at Unknown time  . triamcinolone cream (KENALOG) 0.5 % Apply 1 application topically 2 (two) times daily as needed (for itching/rash).   02/22/2016 at Unknown time  . valsartan (DIOVAN) 80 MG tablet Take 80 mg by mouth daily.   02/23/2016 at Unknown time    Physical Exam: Blood pressure 133/39, pulse 48, temperature 98.2 F (36.8 C), temperature source Oral, resp. rate 20, height 5\' 3"  (1.6 m), weight 76.476 kg (168 lb 9.6 oz), SpO2 94 %.   Wt Readings from Last 1 Encounters:  02/24/16 76.476 kg (168 lb 9.6 oz)     General appearance: alert and cooperative Head: Normocephalic, without obvious abnormality, atraumatic Resp: clear to auscultation bilaterally Chest wall: right sided chest wall tenderness, left sided chest wall tenderness Cardio: S1, S2 normal, systolic murmur: early systolic 1/6, crescendo and decrescendo at lower left sternal border and Sinus bradycardia GI: abnormal findings:  mild tenderness in the epigastrium Extremities: extremities normal, atraumatic, no cyanosis or edema Neurologic: Grossly normal  Labs:   Lab Results  Component Value Date   WBC 13.7* 02/24/2016   HGB 12.5 02/24/2016   HCT 37.0 02/24/2016   MCV 78.0* 02/24/2016   PLT 337 02/24/2016    Recent Labs Lab 02/23/16 1743 02/24/16 0051  NA 140 139  K 4.0 4.0  CL 105 107  CO2 27 25  BUN 14 14  CREATININE 1.14* 1.31*  CALCIUM 10.7* 10.4*  PROT 7.1  --   BILITOT 0.6  --   ALKPHOS 50  --   ALT 11*  --   AST 23  --   GLUCOSE 102* 104*   Lab Results  Component Value Date   TROPONINI <0.03 02/24/2016      Radiology: No acute process on chest and abdominal CT scan. No evidence of congestive heart failure. Patient does have a moderate sized abdominal aortic aneurysm which is stable. no evidence of leak. . EKG: SINUS BRADYCARDIA WITH NO ISCHEMIA   ASSESSMENT AND PLANPatient is a 81 year old female with history of hypertension mild aortic stenosis history of Barrett's esophagus was  admitted with chest pain with both typical and atypical features. EKG was unremarkable. She is ruled out for myocardial infarction. Patient underwent a functional study this morning. Lexiscan stress revealed no arrhythmia or ischemia. We'll review results when available. Should this be negative, would recommend discharge on current medical regimen with follow-up with Dr. Nehemiah Massed. Signed: Teodoro Spray MD, [facility] 02/24/2016, 10:45 AM

## 2016-02-24 NOTE — Discharge Summary (Addendum)
[facility] NAME: Veronica Hubbard    MR#:  [id number]  DATE OF BIRTH:  1935/10/02  DATE OF ADMISSION:  02/23/2016 ADMITTING PHYSICIAN: Henreitta Leber, MD  DATE OF DISCHARGE: 02/24/2016 PRIMARY CARE PHYSICIAN: Neysa Hotter B, PA-C    ADMISSION DIAGNOSIS:  Unstable angina ([place]) [I20.0] Essential hypertension [I10]   DISCHARGE DIAGNOSIS:  Atypical chest pain  SECONDARY DIAGNOSIS:   Past Medical History  Diagnosis Date  . Hypertension   . Asthma   . Hypothyroidism   . Depression   . GERD (gastroesophageal reflux disease)   . Spinal stenosis     HOSPITAL COURSE:   81 year old female with past medical history of retention, asthma, hypothyroid, depression, GERD, spinal stenosis who presents to the hospital due to chest pain.  1. Atypical chest pain-patient's chest pain is very atypical for angina. Patient is allergic to aspirin and therefore was put on Plavix, when necessary nitroglycerin, oxygen, morphine and imdur. Negative cycle her cardiac markers.Normal nuclear medicine stress test.   2. HTN - cont. Irbesartan, Imdur  3. Hypothyroidism - cont. Synthroid.   4. Hyperlipidemia-continue Zetia.  5. Depression-continue Celexa.  6 GERD-continue Protonix.  DISCHARGE CONDITIONS:   Stable,discharge to home today.  CONSULTS OBTAINED:  Treatment Team:  Yolonda Kida, MD Teodoro Spray, MD  DRUG ALLERGIES:   Allergies  Allergen Reactions  . Aspirin Shortness Of Breath  . Celecoxib Shortness Of Breath  . Iodinated Diagnostic Agents Other (See Comments)    Reaction:  Decreased kidney function  . Losartan Itching  . Morphine Nausea And Vomiting  . Verapamil Itching    DISCHARGE MEDICATIONS:   Current Discharge Medication List    CONTINUE these medications which have NOT CHANGED   Details  albuterol (PROVENTIL HFA;VENTOLIN HFA) 108 (90 Base) MCG/ACT inhaler Inhale 2 puffs into the lungs every 4  (four) hours as needed for wheezing or shortness of breath.    citalopram (CELEXA) 20 MG tablet Take 20 mg by mouth daily.    ezetimibe (ZETIA) 10 MG tablet Take 10 mg by mouth daily.    isosorbide mononitrate (IMDUR) 30 MG 24 hr tablet Take 30 mg by mouth daily.    ketoconazole (NIZORAL) 2 % shampoo Apply 1 application topically 2 (two) times a week.    levothyroxine (SYNTHROID, LEVOTHROID) 100 MCG tablet Take 100 mcg by mouth daily before breakfast.    omega-3 acid ethyl esters (LOVAZA) 1 g capsule Take 1 g by mouth at bedtime.     pantoprazole (PROTONIX) 40 MG tablet Take 40 mg by mouth 2 (two) times daily.    triamcinolone cream (KENALOG) 0.5 % Apply 1 application topically 2 (two) times daily as needed (for itching/rash).    valsartan (DIOVAN) 80 MG tablet Take 80 mg by mouth daily.         DISCHARGE INSTRUCTIONS:     If you experience worsening of your admission symptoms, develop shortness of breath, life threatening emergency, suicidal or homicidal thoughts you must seek medical attention immediately by calling 911 or calling your MD immediately  if symptoms less severe.  You Must read complete instructions/literature along with all the possible adverse reactions/side effects for all the Medicines you take and that have been prescribed to you. Take any new Medicines after you have completely understood and accept all the possible adverse reactions/side effects.   Please note  You were cared for by a hospitalist during your hospital stay. If you have any  questions about your discharge medications or the care you received while you were in the hospital after you are discharged, you can call the unit and asked to speak with the hospitalist on call if the hospitalist that took care of you is not available. Once you are discharged, your primary care physician will handle any further medical issues. Please note that NO REFILLS for any discharge medications will be authorized once  you are discharged, as it is imperative that you return to your primary care physician (or establish a relationship with a primary care physician if you do not have one) for your aftercare needs so that they can reassess your need for medications and monitor your lab values.    Today   SUBJECTIVE   No complaint.   VITAL SIGNS:  Blood pressure 141/58, pulse 78, temperature 98.2 F (36.8 C), temperature source Oral, resp. rate 18, height 5\' 3"  (1.6 m), weight 76.476 kg (168 lb 9.6 oz), SpO2 95 %.  I/O:   Intake/Output Summary (Last 24 hours) at 02/24/16 1406 Last data filed at 02/24/16 1335  Gross per 24 hour  Intake    150 ml  Output    800 ml  Net   -650 ml    PHYSICAL EXAMINATION:  GENERAL:  81 y.o.-year-old patient lying in the bed with no acute distress.  EYES: Pupils equal, round, reactive to light and accommodation. No scleral icterus. Extraocular muscles intact.  HEENT: Head atraumatic, normocephalic. Oropharynx and nasopharynx clear.  NECK:  Supple, no jugular venous distention. No thyroid enlargement, no tenderness.  LUNGS: Normal breath sounds bilaterally, no wheezing, rales,rhonchi or crepitation. No use of accessory muscles of respiration.  CARDIOVASCULAR: S1, S2 normal. No murmurs, rubs, or gallops.  ABDOMEN: Soft, non-tender, non-distended. Bowel sounds present. No organomegaly or mass.  EXTREMITIES: No pedal edema, cyanosis, or clubbing.  NEUROLOGIC: Cranial nerves II through XII are intact. Muscle strength 5/5 in all extremities. Sensation intact. Gait not checked.  PSYCHIATRIC: The patient is alert and oriented x 3.  SKIN: No obvious rash, lesion, or ulcer.   DATA REVIEW:   CBC  Recent Labs Lab 02/24/16 0700  WBC 13.7*  HGB 12.5  HCT 37.0  PLT 337    Chemistries   Recent Labs Lab 02/23/16 1743 02/24/16 0051  NA 140 139  K 4.0 4.0  CL 105 107  CO2 27 25  GLUCOSE 102* 104*  BUN 14 14  CREATININE 1.14* 1.31*  CALCIUM 10.7* 10.4*  AST 23   --   ALT 11*  --   ALKPHOS 50  --   BILITOT 0.6  --     Cardiac Enzymes  Recent Labs Lab 02/24/16 0700  TROPONINI <0.03    Microbiology Results  Results for orders placed or performed during the hospital encounter of 02/09/16  C difficile quick scan w PCR reflex     Status: None   Collection Time: 02/09/16  3:42 AM  Result Value Ref Range Status   C Diff antigen NEGATIVE NEGATIVE Final   C Diff toxin NEGATIVE NEGATIVE Final   C Diff interpretation Negative for C. difficile  Final  Gastrointestinal Panel by PCR , Stool     Status: Abnormal   Collection Time: 02/09/16  3:42 AM  Result Value Ref Range Status   Campylobacter species NOT DETECTED NOT DETECTED Final   Plesimonas shigelloides NOT DETECTED NOT DETECTED Final   Salmonella species NOT DETECTED NOT DETECTED Final   Yersinia enterocolitica NOT DETECTED NOT DETECTED Final  Vibrio species NOT DETECTED NOT DETECTED Final   Vibrio cholerae NOT DETECTED NOT DETECTED Final   Enteroaggregative E coli (EAEC) NOT DETECTED NOT DETECTED Final   Enteropathogenic E coli (EPEC) NOT DETECTED NOT DETECTED Final   Enterotoxigenic E coli (ETEC) NOT DETECTED NOT DETECTED Final   Shiga like toxin producing E coli (STEC) NOT DETECTED NOT DETECTED Final   E. coli O157 NOT DETECTED NOT DETECTED Final   Shigella/Enteroinvasive E coli (EIEC) NOT DETECTED NOT DETECTED Final   Cryptosporidium NOT DETECTED NOT DETECTED Final   Cyclospora cayetanensis NOT DETECTED NOT DETECTED Final   Entamoeba histolytica NOT DETECTED NOT DETECTED Final   Giardia lamblia NOT DETECTED NOT DETECTED Final   Adenovirus F40/41 NOT DETECTED NOT DETECTED Final   Astrovirus NOT DETECTED NOT DETECTED Final   Norovirus GI/GII DETECTED (A) NOT DETECTED Final    Comment: CRITICAL RESULT CALLED TO, READ BACK BY AND VERIFIED WITH: LIZ GANNON AT [id number] ON 02/09/16. CTJ    Rotavirus A NOT DETECTED NOT DETECTED Final   Sapovirus (I, II, IV, and V) NOT DETECTED NOT DETECTED  Final    RADIOLOGY:  Ct Abdomen Pelvis Wo Contrast  02/23/2016  CLINICAL DATA:  Severe mediastinal chest pain beginning this afternoon. Thoracic and abdominal aortic aneurysm. Evaluate for aneurysm rupture. EXAM: CT CHEST, ABDOMEN AND PELVIS WITHOUT CONTRAST TECHNIQUE: Multidetector CT imaging of the chest, abdomen and pelvis was performed following the standard protocol without IV contrast. COMPARISON:  Chest CT on 12/05/2013 and AP CT on 02/09/2016 FINDINGS: CT CHEST FINDINGS Mediastinum/Lymph Nodes: No masses or pathologically enlarged lymph nodes identified on this un-enhanced exam. Coronary artery calcification noted. Diffuse atherosclerotic calcifications seen involving the thoracic aorta. Ascending thoracic aorta measures 3.7 cm in maximum diameter. Ectasia of the aortic arch noted. There is mild dilatation of the descending thoracic aorta measuring 4.1 cm on image 39/series 7 which is unchanged since previous study. No evidence of aneurysm leak or rupture. No evidence of mediastinal hematoma. Lungs/Pleura: No pulmonary mass, infiltrate, or effusion. Mild emphysema noted. Musculoskeletal: No chest wall mass or suspicious bone lesions identified. CT ABDOMEN PELVIS FINDINGS Hepatobiliary: No mass visualized on this un-enhanced exam. Gallbladder is unremarkable. Pancreas: No mass or inflammatory process identified on this un-enhanced exam. Spleen: Within normal limits in size. Adrenals/Urinary Tract: Both adrenal glands are normal in appearance. Probable small fluid attenuation cysts again noted both kidneys. A nonobstructive calculus is noted in the right renal pelvis measuring 7 mm. Other tiny less than 5 mm calculi noted in the left kidney. No evidence of ureteral calculi or hydronephrosis. Urinary bladder is distended but otherwise unremarkable in appearance. Stomach/Bowel: No evidence of obstruction, inflammatory process, or abnormal fluid collections. Diverticulosis is seen involving the descending  and sigmoid portions of the colon, however there is no evidence of diverticulitis. Vascular/Lymphatic: No pathologically enlarged lymph nodes. Infrarenal abdominal aortic aneurysm is seen measuring 3.4 cm maximum diameter compared to 3.3 cm previously. No evidence of aneurysm leak or rupture. Diffuse atherosclerotic calcification noted. Reproductive: Prior hysterectomy noted. Adnexal regions are unremarkable in appearance. Other: None. Musculoskeletal:  No suspicious bone lesions identified. 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 No evidence of aneurysm leak or rupture. Recommend follow up by [place] in 3years. This recommendation follows ACR consensus guidelines: White Paper of the ACR Incidental Findings Committee II on Vascular Findings. Joellyn Rued [id number]. Mild emphysema.  No active lung disease. Colonic  diverticulosis. No radiographic evidence of diverticulitis. Bilateral nephrolithiasis. No evidence of ureteral calculi or hydronephrosis. Distended urinary bladder incidentally noted. Recommend clinical correlation for urinary retention. Electronically Signed   By: Earle Gell M.D.   On: 02/23/2016 19:33   Dg Chest 2 View  02/23/2016  CLINICAL DATA:  Intermittent chest pain since 1 p.m. EXAM: CHEST  2 VIEW COMPARISON:  12/05/2013 FINDINGS: Tortuous and ectatic thoracic aorta with atherosclerotic calcification. Heart size within normal limits. Thoracic spondylosis. Linear subsegmental atelectasis or scarring along the lingula. No pleural effusion. Mild thoracic spondylosis. IMPRESSION: 1. Tortuous and ectatic thoracic aorta as shown on multiple prior exams. No acute radiographic findings. Electronically Signed   By: Van Clines M.D.   On: 02/23/2016 18:19   Ct Chest Wo Contrast  02/23/2016  CLINICAL DATA:  Severe mediastinal chest pain beginning this afternoon. Thoracic and  abdominal aortic aneurysm. Evaluate for aneurysm rupture. EXAM: CT CHEST, ABDOMEN AND PELVIS WITHOUT CONTRAST TECHNIQUE: Multidetector CT imaging of the chest, abdomen and pelvis was performed following the standard protocol without IV contrast. COMPARISON:  Chest CT on 12/05/2013 and AP CT on 02/09/2016 FINDINGS: CT CHEST FINDINGS Mediastinum/Lymph Nodes: No masses or pathologically enlarged lymph nodes identified on this un-enhanced exam. Coronary artery calcification noted. Diffuse atherosclerotic calcifications seen involving the thoracic aorta. Ascending thoracic aorta measures 3.7 cm in maximum diameter. Ectasia of the aortic arch noted. There is mild dilatation of the descending thoracic aorta measuring 4.1 cm on image 39/series 7 which is unchanged since previous study. No evidence of aneurysm leak or rupture. No evidence of mediastinal hematoma. Lungs/Pleura: No pulmonary mass, infiltrate, or effusion. Mild emphysema noted. Musculoskeletal: No chest wall mass or suspicious bone lesions identified. CT ABDOMEN PELVIS FINDINGS Hepatobiliary: No mass visualized on this un-enhanced exam. Gallbladder is unremarkable. Pancreas: No mass or inflammatory process identified on this un-enhanced exam. Spleen: Within normal limits in size. Adrenals/Urinary Tract: Both adrenal glands are normal in appearance. Probable small fluid attenuation cysts again noted both kidneys. A nonobstructive calculus is noted in the right renal pelvis measuring 7 mm. Other tiny less than 5 mm calculi noted in the left kidney. No evidence of ureteral calculi or hydronephrosis. Urinary bladder is distended but otherwise unremarkable in appearance. Stomach/Bowel: No evidence of obstruction, inflammatory process, or abnormal fluid collections. Diverticulosis is seen involving the descending and sigmoid portions of the colon, however there is no evidence of diverticulitis. Vascular/Lymphatic: No pathologically enlarged lymph nodes. Infrarenal  abdominal aortic aneurysm is seen measuring 3.4 cm maximum diameter compared to 3.3 cm previously. No evidence of aneurysm leak or rupture. Diffuse atherosclerotic calcification noted. Reproductive: Prior hysterectomy noted. Adnexal regions are unremarkable in appearance. Other: None. Musculoskeletal:  No suspicious bone lesions identified. 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 No evidence of aneurysm leak or rupture. Recommend follow up by [place] in 3years. This recommendation follows ACR consensus guidelines: White Paper of the ACR Incidental Findings Committee II on Vascular Findings. Joellyn Rued [id number]. Mild emphysema.  No active lung disease. Colonic diverticulosis. No radiographic evidence of diverticulitis. Bilateral nephrolithiasis. No evidence of ureteral calculi or hydronephrosis. Distended urinary bladder incidentally noted. Recommend clinical correlation for urinary retention. Electronically Signed   By: Earle Gell M.D.   On: 02/23/2016 19:33   Nm Myocar Multi W/spect W/wall Motion / Ef  02/24/2016   The study is normal.  This is a low risk study.  The left ventricular ejection fraction is normal (55-65%).  There was no ST segment deviation noted during stress.  No T wave inversion was noted during stress.         Management plans discussed with the patient, Dr. Ubaldo Glassing and they are in agreement.  CODE STATUS:     Code Status Orders        Start     Ordered   02/24/16 0024  Full code   Continuous     02/24/16 0024    Code Status History    Date Active Date Inactive Code Status Order ID Comments User Context   02/09/2016 10:42 AM 02/10/2016  8:23 PM Full Code [id number]  Loletha Grayer, MD ED      TOTAL TIME TAKING CARE OF THIS PATIENT: 32 minutes.    Demetrios Loll M.D on 02/24/2016 at 2:06 PM  Between 7am to 6pm - Pager - [phone]  After 6pm go to  [email or web address] - password [email or web address] [facility]  Office  [phone]  CC: Primary care physician; Antionette Fairy, PA-C

## 2016-02-29 ENCOUNTER — Ambulatory Visit
Admission: RE | Admit: 2016-02-29 | Discharge: 2016-02-29 | Disposition: A | Discharge status: Home / Self Care | Payer: Medicare HMO | Source: Ambulatory Visit | Attending: Physician Assistant | Admitting: Physician Assistant

## 2016-02-29 DIAGNOSIS — Z1231 Encounter for screening mammogram for malignant neoplasm of breast: Secondary | ICD-10-CM | POA: Diagnosis not present

## 2016-05-21 ENCOUNTER — Emergency Department
Admission: EM | Admit: 2016-05-21 | Discharge: 2016-05-21 | Disposition: A | Discharge status: Home / Self Care | Payer: Medicare HMO | Attending: Emergency Medicine | Admitting: Emergency Medicine

## 2016-05-21 DIAGNOSIS — J3489 Other specified disorders of nose and nasal sinuses: Secondary | ICD-10-CM | POA: Diagnosis not present

## 2016-05-21 DIAGNOSIS — R040 Epistaxis: Secondary | ICD-10-CM | POA: Diagnosis present

## 2016-05-21 DIAGNOSIS — I10 Essential (primary) hypertension: Secondary | ICD-10-CM | POA: Diagnosis not present

## 2016-05-21 DIAGNOSIS — J45909 Unspecified asthma, uncomplicated: Secondary | ICD-10-CM | POA: Insufficient documentation

## 2016-05-21 DIAGNOSIS — E039 Hypothyroidism, unspecified: Secondary | ICD-10-CM | POA: Insufficient documentation

## 2016-05-21 DIAGNOSIS — F329 Major depressive disorder, single episode, unspecified: Secondary | ICD-10-CM | POA: Diagnosis not present

## 2016-05-21 DIAGNOSIS — Z87891 Personal history of nicotine dependence: Secondary | ICD-10-CM | POA: Insufficient documentation

## 2016-05-21 LAB — CBC WITH DIFFERENTIAL/PLATELET
BASOS ABS: 0.7 10*3/uL — AB (ref 0–0.1)
Basophils Relative: 4 %
Eosinophils Absolute: 1.3 10*3/uL — ABNORMAL HIGH (ref 0–0.7)
Eosinophils Relative: 7 %
HCT: 42.5 % (ref 35.0–47.0)
Hemoglobin: 14.4 g/dL (ref 12.0–16.0)
LYMPHS ABS: 2.8 10*3/uL (ref 1.0–3.6)
Lymphocytes Relative: 15 %
MCH: 26.7 pg (ref 26.0–34.0)
MCHC: 34 g/dL (ref 32.0–36.0)
MCV: 78.5 fL — AB (ref 80.0–100.0)
MONO ABS: 0.9 10*3/uL (ref 0.2–0.9)
Monocytes Relative: 5 %
Neutro Abs: 13 10*3/uL — ABNORMAL HIGH (ref 1.4–6.5)
Neutrophils Relative %: 69 %
PLATELETS: 403 10*3/uL (ref 150–440)
RBC: 5.41 MIL/uL — AB (ref 3.80–5.20)
RDW: 19.9 % — AB (ref 11.5–14.5)
WBC: 18.7 10*3/uL — AB (ref 3.6–11.0)

## 2016-05-21 LAB — BASIC METABOLIC PANEL
ANION GAP: 5 (ref 5–15)
BUN: 18 mg/dL (ref 6–20)
CO2: 29 mmol/L (ref 22–32)
CREATININE: 1.36 mg/dL — AB (ref 0.44–1.00)
Calcium: 10.7 mg/dL — ABNORMAL HIGH (ref 8.9–10.3)
Chloride: 104 mmol/L (ref 101–111)
GFR, EST AFRICAN AMERICAN: 42 mL/min — AB (ref >60)
GFR, EST NON AFRICAN AMERICAN: 36 mL/min — AB (ref >60)
GLUCOSE: 101 mg/dL — AB (ref 65–99)
Potassium: 4.3 mmol/L (ref 3.5–5.1)
Sodium: 138 mmol/L (ref 135–145)

## 2016-05-21 LAB — PROTIME-INR
INR: 1.15
Prothrombin Time: 14.9 seconds (ref 11.4–15.0)

## 2016-05-21 LAB — APTT: APTT: 32 s (ref 24–36)

## 2016-05-21 MED ORDER — PHENYLEPHRINE HCL 0.5 % NA SOLN
1.0000 [drp] | Freq: Once | NASAL | Status: AC
  Administered 2016-05-21: 1 [drp] via NASAL

## 2016-05-21 MED ORDER — SILVER NITRATE-POT NITRATE 75-25 % EX MISC
1.0000 "application " | Freq: Once | CUTANEOUS | Status: DC
  Filled 2016-05-21: qty 1

## 2016-05-21 MED ORDER — SILVER NITRATE-POT NITRATE 75-25 % EX MISC: 1.0000 "application " | Freq: Once | CUTANEOUS | Status: DC

## 2016-05-21 MED ORDER — PHENYLEPHRINE HCL 0.5 % NA SOLN: 1.0000 [drp] | Freq: Once | NASAL | Status: DC

## 2016-05-21 MED ORDER — SILVER NITRATE-POT NITRATE 75-25 % EX MISC
CUTANEOUS | Status: AC
  Filled 2016-05-21: qty 1

## 2016-05-21 MED ORDER — PHENYLEPHRINE HCL 0.5 % NA SOLN
NASAL | Status: AC
  Administered 2016-05-21: 1 [drp] via NASAL
  Filled 2016-05-21: qty 15

## 2016-05-21 NOTE — Consult Note (Signed)
Veronica Hubbard, [place] [id number] 1935/04/04 Orbie Pyo,*  Reason for Consult: Uncontrolled epistaxis  HPI: The patient is a 81 year old white female who is had hypertension and [place] nosebleeds in the past. She has with nosebleeds earlier yesterday morning and has had some on and off throughout the day. She finally presented afternoon because it has been bleeding more from past several hours. She has a habit of taking her nose because every morning she has lots of scabs in her nose and has to clean those out. She uses nasal O2 at night.  Allergies:  Allergies  Allergen Reactions  . Aspirin Shortness Of Breath  . Celecoxib Shortness Of Breath  . Iodinated Diagnostic Agents Other (See Comments)    Reaction:  Decreased kidney function  . Losartan Itching  . Morphine Nausea And Vomiting  . Verapamil Itching    ROS: Review of systems normal other than 12 systems except per HPI.  PMH:  Past Medical History  Diagnosis Date  . Hypertension   . Asthma   . Hypothyroidism   . Depression   . GERD (gastroesophageal reflux disease)   . Spinal stenosis     FH:  Family History  Problem Relation Age of Onset  . Leukemia Mother   . CAD Father   . Diabetes Father   . Breast cancer Sister 71    SH:  Social History   Social History  . Marital Status: Single    Spouse Name: N/A  . Number of Children: N/A  . Years of Education: N/A   Occupational History  . Not on file.   Social History Main Topics  . Smoking status: Former Smoker    Types: Cigarettes  . Smokeless tobacco: Not on file  . Alcohol Use: No  . Drug Use: No  . Sexual Activity: Not on file   Other Topics Concern  . Not on file   Social History Narrative    PSH:  Past Surgical History  Procedure Laterality Date  . Abdominal hysterectomy    . Cataracts    . Back surgery    . Shoulder surgery    . Lipoma removal    . Breast biopsy Left 2003    neg    Physical  Exam: CN 2-12 grossly intact and  symmetric.  Oral cavity, lips, gums, ororpharynx normal with no masses or lesionsAnd no active bleeding down the back of her throat.. Skin warm and dry. Nasal cavity shows large septal perforation that approximately a little over 2 cm in size. There is some mucosal coverage of this to suggest been there long-term. She has a blood clot along the right posterior inferior side of the septal perforation. She has large turbinates and a small nasal airway on both sides.. External nose and ears without masses or lesions.  Neck supple with no masses or lesions. No lymphadenopathy palpated.   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 She has now clotted spontaneously and is not do any active bleeding. This is a venous bleed along the right side of the septal spur. I stressed the importance of lubricating this area to keep it moist and 2 help prevent the clots from breaking off and bleeding recurring. She'll make sure she has humidification for nasal O2 and even consider room humidifier as well. She is going home to bed rest for tonight and all day tomorrow to keep her blood pressure down and help make sure this is a long-term solid clot. She knows it will  take a good week or more to heal and we will use Vaseline for about 2 weeks. She'll follow-up in my office of in week or so and [place], or if she has any further bleeding this week when I am out of the office, she will see Dr. Pryor Ochoa in [place] since she has seen him previously.   Nigel Ericsson H 05/21/2016 10:26 PM

## 2016-05-21 NOTE — ED Notes (Signed)
Pt reports nose bleed for the past 4 hours denies use of anticoagulants. States she has had this in the past but it has never lasted this long

## 2016-05-21 NOTE — Discharge Instructions (Signed)
Place vasoline to the inside/middle of your nose twice a day  Nosebleed Nosebleeds are common. A nosebleed can be caused by many things, including:  Getting hit hard in the nose.  Infections.  Dryness in your nose.  A dry climate.  Medicines.  Picking your nose.  Your home heating and cooling systems. HOME CARE   Try controlling your nosebleed by pinching your nostrils gently. Do this for at least 10 minutes.  Avoid blowing or sniffing your nose for a number of hours after having a nosebleed.  Do not put gauze inside of your nose yourself. If your nose was packed by your doctor, try to keep the pack inside of your nose until your doctor removes it.  If a gauze pack was used and it starts to fall out, gently replace it or cut off the end of it.  If a balloon catheter was used to pack your nose, do not cut or remove it unless told by your doctor.  Avoid lying down while you are having a nosebleed. Sit up and lean forward.  Use a nasal spray decongestant to help with a nosebleed as told by your doctor.  Do not use petroleum jelly or mineral oil in your nose. These can drip into your lungs.  Keep your house humid by using:  Less air conditioning.  A humidifier.  Aspirin and blood thinners make bleeding more likely. If you are prescribed these medicines and you have nosebleeds, ask your doctor if you should stop taking the medicines or adjust the dose. Do not stop medicines unless told by your doctor.  Resume your normal activities as you are able. Avoid straining, lifting, or bending at your waist for several days.  If your nosebleed was caused by dryness in your nose, use over-the-counter saline nasal spray or gel. If you must use a lubricant:  Choose one that is water-soluble.  Use it only as needed.  Do not use it within several hours of lying down.  Keep all follow-up visits as told by your doctor. This is important. GET HELP IF:  You have a fever.  You get  frequent nosebleeds.  You are getting nosebleeds more often. GET HELP RIGHT AWAY IF:  Your nosebleed lasts longer than 20 minutes.  Your nosebleed occurs after an injury to your face, and your nose looks crooked or broken.  You have unusual bleeding from other parts of your body.  You have unusual bruising on other parts of your body.  You feel light-headed or dizzy.  You become sweaty.  You throw up (vomit) blood.  You have a nosebleed after a head injury.   This information is not intended to replace advice given to you by your health care provider. Make sure you discuss any questions you have with your health care provider.   Document Released: 08/01/2008 Document Revised: 11/13/2014 Document Reviewed: 06/08/2014 Elsevier Interactive Patient Education [employer].

## 2016-05-21 NOTE — ED Notes (Signed)
Pt with nosebleed since 1400 today, reports that applying pressure has not stopped. Denies any trauma, injury or blood thinner use at home.

## 2016-05-21 NOTE — ED Notes (Signed)
Pt got to the lobby when her nose started bleeding again. Pt returned to ED Rm 36; Dr Clearnce Hasten notified.

## 2016-05-21 NOTE — ED Provider Notes (Addendum)
[facility] Emergency Department Provider Note   ____________________________________________  Time seen: Approximately 6 PM  I have reviewed the triage vital signs and the nursing notes.   HISTORY  Chief Complaint Epistaxis   HPI Veronica Hubbard is a 81 y.o. female with a history of hypertension and sporadic nosebleeds was presenting to the emergency department with a persistent nosebleed. She says that it is been ongoing over the past several hours. She denies any known coagulopathy or being on any blood thinners. She says that she occasionally takes her nose and also wears as needed oxygen. She says that she is also been sneezing a lot lately.  Has had small clots from her nose throughout the day.  Past Medical History  Diagnosis Date  . Hypertension   . Asthma   . Hypothyroidism   . Depression   . GERD (gastroesophageal reflux disease)   . Spinal stenosis     Patient Active Problem List   Diagnosis Date Noted  . Hyponatremia 02/23/2016  . Chest pain 02/23/2016  . Gastroenteritis due to norovirus 02/09/2016    Past Surgical History  Procedure Laterality Date  . Abdominal hysterectomy    . Cataracts    . Back surgery    . Shoulder surgery    . Lipoma removal    . Breast biopsy Left 2003    neg    Current Outpatient Rx  Name  Route  Sig  Dispense  Refill  . albuterol (PROVENTIL HFA;VENTOLIN HFA) 108 (90 Base) MCG/ACT inhaler   Inhalation   Inhale 2 puffs into the lungs every 4 (four) hours as needed for wheezing or shortness of breath.         . citalopram (CELEXA) 20 MG tablet   Oral   Take 20 mg by mouth daily.         Marland Kitchen ezetimibe (ZETIA) 10 MG tablet   Oral   Take 10 mg by mouth daily.         . isosorbide mononitrate (IMDUR) 30 MG 24 hr tablet   Oral   Take 30 mg by mouth daily.         Marland Kitchen ketoconazole (NIZORAL) 2 % shampoo   Topical   Apply 1 application topically 2 (two) times a week.         .  levothyroxine (SYNTHROID, LEVOTHROID) 100 MCG tablet   Oral   Take 100 mcg by mouth daily before breakfast.         . omega-3 acid ethyl esters (LOVAZA) 1 g capsule   Oral   Take 1 g by mouth at bedtime.          . pantoprazole (PROTONIX) 40 MG tablet   Oral   Take 40 mg by mouth 2 (two) times daily.         Marland Kitchen triamcinolone cream (KENALOG) 0.5 %   Topical   Apply 1 application topically 2 (two) times daily as needed (for itching/rash).         . valsartan (DIOVAN) 80 MG tablet   Oral   Take 80 mg by mouth daily.           Allergies Aspirin; Celecoxib; Iodinated diagnostic agents; Losartan; Morphine; and Verapamil  Family History  Problem Relation Age of Onset  . Leukemia Mother   . CAD Father   . Diabetes Father   . Breast cancer Sister 63    Social History Social History  Substance Use Topics  . Smoking  status: Former Smoker    Types: Cigarettes  . Smokeless tobacco: Not on file  . Alcohol Use: No    Review of Systems Constitutional: No fever/chills Eyes: No visual changes. ENT: No sore throat. Cardiovascular: Denies chest pain. Respiratory: Denies shortness of breath. Gastrointestinal: No abdominal pain.  No nausea, no vomiting.  No diarrhea.  No constipation. Genitourinary: Negative for dysuria. Musculoskeletal: Negative for back pain. Skin: Negative for rash. Neurological: Negative for headaches, focal weakness or numbness.  10-point ROS otherwise negative.  ____________________________________________   PHYSICAL EXAM:  VITAL SIGNS: ED Triage Vitals  Enc Vitals Group     BP 05/21/16 1651 161/77 mmHg     Pulse --      Resp 05/21/16 1651 20     Temp 05/21/16 1651 98.2 F (36.8 C)     Temp Source 05/21/16 1651 Oral     SpO2 --      Weight 05/21/16 1651 167 lb (75.751 kg)     Height 05/21/16 1651 5\' 3"  (1.6 m)     Head Cir --      Peak Flow --      Pain Score 05/21/16 1653 5     Pain Loc --      Pain Edu? --      Excl. in [place]? --      Constitutional: Alert and oriented. Well appearing and in no acute distress. Eyes: Conjunctivae are normal. PERRL. EOMI. Head: Atraumatic. Nose: Drooping blood from the nose greater on the left than the right. On inspection there is fresh blood to the bilateral naris the anterior septum. Mouth/Throat: Mucous membranes are moist.  No bleeding pouring down the back of the pharynx. Neck: No stridor.   Cardiovascular: Normal rate, regular rhythm. Grossly normal heart sounds.   Respiratory: Normal respiratory effort.  No retractions. Lungs CTAB. Gastrointestinal:  No distention. Musculoskeletal: No lower extremity tenderness nor edema.  No joint effusions. Neurologic:  Normal speech and language. No gross focal neurologic deficits are appreciated. No gait instability. Skin:  Skin is warm, dry and intact. No rash noted. Psychiatric: Mood and affect are normal. Speech and behavior are normal.  ____________________________________________   LABS (all labs ordered are listed, but only abnormal results are displayed)  Labs Reviewed - No data to display ____________________________________________  EKG   ____________________________________________  RADIOLOGY   ____________________________________________   PROCEDURES    Procedures  Epistaxis treatment.  Nasal clamps applied for 20 minutes. Removed and patient still with dripping blood in bilateral naris. I then soaked to cotton balls in Neo-Synephrine and placed one in each nostril. These were then placed again for 15 minutes bilaterally with a nasal clamp. There was resolution of the bleeding at that time without any further bleeding.  ____________________________________________   INITIAL IMPRESSION / ASSESSMENT AND PLAN / ED COURSE  Pertinent labs & imaging results that were available during my care of the patient were reviewed by me and considered in my medical decision making (see chart for  details).  ----------------------------------------- 7:28 PM on 05/21/2016 -----------------------------------------  Patient with resolution of her symptoms. Will be discharged to home. She will be putting Vaseline to the bilateral naris twice a day to keep the naris moisturized.  Patient also with normal platelets in April. ____________________________________________   FINAL CLINICAL IMPRESSION(S) / ED DIAGNOSES  Anterior epistaxis.    NEW MEDICATIONS STARTED DURING THIS VISIT:  New Prescriptions   No medications on file     Note:  This document was prepared using Dragon  voice recognition software and may include unintentional dictation errors.    Orbie Pyo, MD 05/21/16 1929  Patient was initially discharged and then return to the emergency department after her nose started bleeding again in the lobby. Once she came back I reexamined her and found her to have a bleeding spot to the right near which I attempted cauterize. This provided minimal relief before the full bleeding resumed again. At this time I reinserted Neo-Synephrine soaked cotton balls the bilateral nares and use compression again with the nasal clamps for 20 minutes. Patient bled through the cotton balls at this time. At this point I attempted to use the [employer] nasal packing to the right near was unable to thread the [employer]. At this time at 9:05 PM a call was placed to Dr. Kathyrn Sheriff of ear nose and throat.  ----------------------------------------- 10:27 PM on 05/21/2016 -----------------------------------------  Patient's epistaxis has spontaneously resolved but the time Dr. Kathyrn Sheriff arrived. Examined at the bedside and found to have a septal perforation. Patient was explained the need for bedrest as well as Vaseline application over the next 14 days. She'll be following up with ear nose and throat within 1 week.  Orbie Pyo, MD 05/21/16 2229

## 2016-09-26 ENCOUNTER — Other Ambulatory Visit (INDEPENDENT_AMBULATORY_CARE_PROVIDER_SITE_OTHER): Payer: Self-pay | Admitting: Vascular Surgery

## 2016-09-26 DIAGNOSIS — I6523 Occlusion and stenosis of bilateral carotid arteries: Secondary | ICD-10-CM

## 2016-10-02 ENCOUNTER — Encounter (INDEPENDENT_AMBULATORY_CARE_PROVIDER_SITE_OTHER): Payer: Self-pay

## 2016-10-02 ENCOUNTER — Other Ambulatory Visit (INDEPENDENT_AMBULATORY_CARE_PROVIDER_SITE_OTHER): Payer: Self-pay | Admitting: Vascular Surgery

## 2016-10-02 ENCOUNTER — Ambulatory Visit (INDEPENDENT_AMBULATORY_CARE_PROVIDER_SITE_OTHER): Payer: Self-pay | Admitting: Vascular Surgery

## 2016-10-02 DIAGNOSIS — I6523 Occlusion and stenosis of bilateral carotid arteries: Secondary | ICD-10-CM

## 2016-11-24 ENCOUNTER — Ambulatory Visit (INDEPENDENT_AMBULATORY_CARE_PROVIDER_SITE_OTHER): Payer: Self-pay | Admitting: Vascular Surgery

## 2016-11-24 ENCOUNTER — Encounter (INDEPENDENT_AMBULATORY_CARE_PROVIDER_SITE_OTHER): Payer: Medicare HMO

## 2017-02-12 ENCOUNTER — Ambulatory Visit (INDEPENDENT_AMBULATORY_CARE_PROVIDER_SITE_OTHER): Payer: Self-pay | Admitting: Vascular Surgery

## 2017-02-12 ENCOUNTER — Encounter (INDEPENDENT_AMBULATORY_CARE_PROVIDER_SITE_OTHER): Payer: Self-pay

## 2017-02-12 ENCOUNTER — Other Ambulatory Visit (INDEPENDENT_AMBULATORY_CARE_PROVIDER_SITE_OTHER): Payer: Self-pay

## 2017-04-09 ENCOUNTER — Other Ambulatory Visit (INDEPENDENT_AMBULATORY_CARE_PROVIDER_SITE_OTHER): Payer: Self-pay | Admitting: Vascular Surgery

## 2017-04-09 ENCOUNTER — Ambulatory Visit (INDEPENDENT_AMBULATORY_CARE_PROVIDER_SITE_OTHER): Payer: Medicare HMO

## 2017-04-09 ENCOUNTER — Ambulatory Visit (INDEPENDENT_AMBULATORY_CARE_PROVIDER_SITE_OTHER): Payer: Medicare HMO | Admitting: Vascular Surgery

## 2017-04-09 VITALS — BP 147/70 | HR 66 | Resp 16 | Wt 164.0 lb

## 2017-04-09 DIAGNOSIS — I6523 Occlusion and stenosis of bilateral carotid arteries: Secondary | ICD-10-CM | POA: Diagnosis not present

## 2017-04-09 DIAGNOSIS — I714 Abdominal aortic aneurysm, without rupture, unspecified: Secondary | ICD-10-CM

## 2017-04-09 DIAGNOSIS — I712 Thoracic aortic aneurysm, without rupture, unspecified: Secondary | ICD-10-CM | POA: Insufficient documentation

## 2017-04-09 LAB — VAS US CAROTID
LCCADSYS: -137 cm/s
LCCAPDIAS: 9 cm/s
LCCAPSYS: 69 cm/s
LEFT ECA DIAS: -8 cm/s
LICADDIAS: -24 cm/s
Left CCA dist dias: -24 cm/s
Left ICA dist sys: -106 cm/s
Left ICA prox dias: -28 cm/s
Left ICA prox sys: -160 cm/s
RCCADSYS: -69 cm/s
RIGHT CCA MID DIAS: 14 cm/s
Right CCA prox dias: -12 cm/s
Right CCA prox sys: -96 cm/s

## 2017-04-09 NOTE — Progress Notes (Signed)
Subjective:    Patient ID: Veronica Hubbard, female    DOB: [birth year], 82 y.o.   MRN: [id number] Chief Complaint  Patient presents with  . Follow-up   Patient presents for a yearly follow-up regarding multiple vascular issues Patient presents for a yearly non-invasive study follow up for carotid stenosis. The stenosis has been followed by surveillance duplexes.The patient underwent a bilateral carotid duplex scan which showed no change from the previous exam on 03/27/16. Duplex is stable at 1-39% ICA stenosis bilaterally. The patient denies experiencing Amaurosis Fugax, TIA like symptoms or focal motor deficits. The patient presents for AAA follow up. She underwent an aortic duplex which was notable for an abdominal aortic aneurysm measuring 3.53cm AP x 3.56cm transverse (previous 3.3cm x 3.11cm on 02/03/16). She denies any symptoms such as back pain, pulsatile abdominal masses or thrombosis in his extremities. Patient also has a history of a descending thoracic aneurysm. Measured at 4.1cm on 02/23/2016. She denies any chest pain or shortness of breath. She has not undergone a CTA of the chest this year.   Review of Systems  Constitutional: Negative.   HENT: Negative.   Eyes: Negative.   Respiratory: Negative.   Cardiovascular: Negative.   Gastrointestinal: Negative.   Endocrine: Negative.   Genitourinary: Negative.   Musculoskeletal: Negative.   Skin: Negative.   Allergic/Immunologic: Negative.   Neurological: Negative.   Hematological: Negative.   Psychiatric/Behavioral: Negative.       Objective:   Physical Exam  Constitutional: She is oriented to person, place, and time. She appears well-developed and well-nourished. No distress.  HENT:  Head: Normocephalic and atraumatic.  Eyes: Conjunctivae are normal. Pupils are equal, round, and reactive to light.  Neck: Normal range of motion.  No carotid bruits noted  Cardiovascular: Normal rate, regular rhythm and normal heart  sounds.   Pulses:      Radial pulses are 2+ on the right side, and 2+ on the left side.       Dorsalis pedis pulses are 2+ on the right side, and 2+ on the left side.       Posterior tibial pulses are 2+ on the right side, and 2+ on the left side.  Pulmonary/Chest: Effort normal and breath sounds normal.  Abdominal: Soft. Bowel sounds are normal.  Musculoskeletal: Normal range of motion. She exhibits no edema.  Neurological: She is alert and oriented to person, place, and time.  Skin: Skin is warm and dry. She is not diaphoretic.  Psychiatric: She has a normal mood and affect. Her behavior is normal. Judgment and thought content normal.  Vitals reviewed.   BP (!) 147/70   Pulse 66   Resp 16   Wt 164 lb (74.4 kg)   BMI 29.05 kg/m   Past Medical History:  Diagnosis Date  . Asthma   . Depression   . GERD (gastroesophageal reflux disease)   . Hypertension   . Hypothyroidism   . Spinal stenosis     Social History   Social History  . Marital status: Single    Spouse name: N/A  . Number of children: N/A  . Years of education: N/A   Occupational History  . Not on file.   Social History Main Topics  . Smoking status: Former Smoker    Types: Cigarettes  . Smokeless tobacco: Not on file  . Alcohol use No  . Drug use: No  . Sexual activity: Not on file   Other Topics Concern  . Not on file  Social History Narrative  . No narrative on file    Past Surgical History:  Procedure Laterality Date  . ABDOMINAL HYSTERECTOMY    . BACK SURGERY    . BREAST BIOPSY Left 2003   neg  . cataracts    . lipoma removal    . SHOULDER SURGERY      Family History  Problem Relation Age of Onset  . Leukemia Mother   . CAD Father   . Diabetes Father   . Breast cancer Sister 64    Allergies  Allergen Reactions  . Aspirin Shortness Of Breath  . Celecoxib Shortness Of Breath  . Iodinated Diagnostic Agents Other (See Comments)    Reaction:  Decreased kidney function  .  Losartan Itching  . Morphine Nausea And Vomiting  . Verapamil Itching       Assessment & Plan:  Patient presents for a yearly follow-up regarding multiple vascular issues Patient presents for a yearly non-invasive study follow up for carotid stenosis. The stenosis has been followed by surveillance duplexes.The patient underwent a bilateral carotid duplex scan which showed no change from the previous exam on 03/27/16. Duplex is stable at 1-39% ICA stenosis bilaterally. The patient denies experiencing Amaurosis Fugax, TIA like symptoms or focal motor deficits. The patient presents for AAA follow up. She underwent an aortic duplex which was notable for an abdominal aortic aneurysm measuring 3.53cm AP x 3.56cm transverse (previous 3.3cm x 3.11cm on 02/03/16). She denies any symptoms such as back pain, pulsatile abdominal masses or thrombosis in his extremities. Patient also has a history of a descending thoracic aneurysm. Measured at 4.1cm on 02/23/2016. She denies any chest pain or shortness of breath. She has not undergone a CTA of the chest this year.  1. AAA (abdominal aortic aneurysm) without rupture (HCC) - Stable Studies reviewed with patient. Duplex stable, physical exam unremarkable.  No surgery or intervention at this time. The patient to follow up in 1 year with an aortic duplex. The patient has an asymptomatic abdominal aortic aneurysm that is less than 4 cm in maximal diameter.  I have reviewed the natural history of abdominal aortic aneurysm and the small risk of rupture for aneurysm less than 5 cm in size.  However, as these small aneurysms tend to enlarge over time, continued surveillance with ultrasound or CT scan is mandatory.  The patient's blood pressure is being adequately controlled however I have reviewed the importance of hypertension and lipid control and the importance of continuing his abstinence from tobacco.  The patient is also encouraged to exercise a minimum of 30 minutes 4  times a week.  Should the patient develop new onset abdominal or back pain or signs of peripheral embolization they are instructed to seek medical attention immediately and to alert the physician providing care that they have an aneurysm.  The patient voices their understanding.  2. Bilateral carotid artery stenosis - stable Studies reviewed with patient. Patient asymptomatic with stable duplex.  No intervention at this time. Patient to return in one year for surveillance carotid duplex. Patient to remain abstinent of tobacco use. I have discussed with the patient at length the risk factors for and pathogenesis of atherosclerotic disease and encouraged a healthy diet, regular exercise regimen and blood pressure / glucose control.  Patient was instructed to contact our office in the interim with problems such as arm / leg weakness or numbness, speech / swallowing difficulty or temporary monocular blindness. The patient expresses their understanding.  3. Thoracic aortic  aneurysm without rupture (HCC) - stable Patient has not had a CTA of the chest this year to assess her thoracic aortic aneurysm. We'll order a CTA of the chest. I will also order one for next year as the patient should be followed annually with a CT of the chest. She is asymptomatic at this time.  - CT Angio Chest W/Cm &/Or Wo Cm; Future - BUN+Creat; Future - CT Angio Chest W/Cm &/Or Wo Cm; Future - BUN+Creat; Future   Current Outpatient Prescriptions on File Prior to Visit  Medication Sig Dispense Refill  . albuterol (PROVENTIL HFA;VENTOLIN HFA) 108 (90 Base) MCG/ACT inhaler Inhale 2 puffs into the lungs every 4 (four) hours as needed for wheezing or shortness of breath.    . citalopram (CELEXA) 20 MG tablet Take 20 mg by mouth daily.    Marland Kitchen ezetimibe (ZETIA) 10 MG tablet Take 10 mg by mouth daily.    . isosorbide mononitrate (IMDUR) 30 MG 24 hr tablet Take 30 mg by mouth daily.    Marland Kitchen ketoconazole (NIZORAL) 2 % shampoo  Apply 1 application topically 2 (two) times a week.    . levothyroxine (SYNTHROID, LEVOTHROID) 100 MCG tablet Take 100 mcg by mouth daily before breakfast.    . omega-3 acid ethyl esters (LOVAZA) 1 g capsule Take 1 g by mouth at bedtime.     . pantoprazole (PROTONIX) 40 MG tablet Take 40 mg by mouth 2 (two) times daily.    Marland Kitchen triamcinolone cream (KENALOG) 0.5 % Apply 1 application topically 2 (two) times daily as needed (for itching/rash).    . valsartan (DIOVAN) 80 MG tablet Take 80 mg by mouth daily.     No current facility-administered medications on file prior to visit.     There are no Patient Instructions on file for this visit. No Follow-up on file.   Juliette Standre A Saniyyah Elster, PA-C

## 2017-04-10 ENCOUNTER — Other Ambulatory Visit: Payer: Self-pay | Admitting: Physician Assistant

## 2017-04-10 DIAGNOSIS — Z1231 Encounter for screening mammogram for malignant neoplasm of breast: Secondary | ICD-10-CM

## 2017-05-22 ENCOUNTER — Ambulatory Visit
Admission: RE | Admit: 2017-05-22 | Discharge: 2017-05-22 | Disposition: A | Discharge status: Home / Self Care | Payer: Medicare HMO | Source: Ambulatory Visit | Attending: Physician Assistant | Admitting: Physician Assistant

## 2017-05-22 DIAGNOSIS — Z1231 Encounter for screening mammogram for malignant neoplasm of breast: Secondary | ICD-10-CM | POA: Diagnosis not present

## 2017-09-13 ENCOUNTER — Encounter (INDEPENDENT_AMBULATORY_CARE_PROVIDER_SITE_OTHER): Payer: Self-pay

## 2017-10-24 DIAGNOSIS — I714 Abdominal aortic aneurysm, without rupture: Secondary | ICD-10-CM | POA: Diagnosis not present

## 2017-10-24 DIAGNOSIS — E782 Mixed hyperlipidemia: Secondary | ICD-10-CM | POA: Diagnosis not present

## 2017-10-24 DIAGNOSIS — I952 Hypotension due to drugs: Secondary | ICD-10-CM | POA: Diagnosis not present

## 2017-10-26 DIAGNOSIS — E210 Primary hyperparathyroidism: Secondary | ICD-10-CM | POA: Diagnosis not present

## 2017-10-26 DIAGNOSIS — E212 Other hyperparathyroidism: Secondary | ICD-10-CM | POA: Diagnosis not present

## 2017-10-26 DIAGNOSIS — N183 Chronic kidney disease, stage 3 (moderate): Secondary | ICD-10-CM | POA: Diagnosis not present

## 2017-10-26 DIAGNOSIS — I129 Hypertensive chronic kidney disease with stage 1 through stage 4 chronic kidney disease, or unspecified chronic kidney disease: Secondary | ICD-10-CM | POA: Diagnosis not present

## 2017-10-26 DIAGNOSIS — D631 Anemia in chronic kidney disease: Secondary | ICD-10-CM | POA: Diagnosis not present

## 2017-11-01 DIAGNOSIS — R809 Proteinuria, unspecified: Secondary | ICD-10-CM | POA: Diagnosis not present

## 2017-11-01 DIAGNOSIS — N183 Chronic kidney disease, stage 3 (moderate): Secondary | ICD-10-CM | POA: Diagnosis not present

## 2017-11-01 DIAGNOSIS — E210 Primary hyperparathyroidism: Secondary | ICD-10-CM | POA: Diagnosis not present

## 2017-11-01 DIAGNOSIS — D631 Anemia in chronic kidney disease: Secondary | ICD-10-CM | POA: Diagnosis not present

## 2017-11-01 DIAGNOSIS — E212 Other hyperparathyroidism: Secondary | ICD-10-CM | POA: Diagnosis not present

## 2017-11-01 DIAGNOSIS — I129 Hypertensive chronic kidney disease with stage 1 through stage 4 chronic kidney disease, or unspecified chronic kidney disease: Secondary | ICD-10-CM | POA: Diagnosis not present

## 2018-01-22 DIAGNOSIS — I129 Hypertensive chronic kidney disease with stage 1 through stage 4 chronic kidney disease, or unspecified chronic kidney disease: Secondary | ICD-10-CM | POA: Diagnosis not present

## 2018-01-22 DIAGNOSIS — N183 Chronic kidney disease, stage 3 (moderate): Secondary | ICD-10-CM | POA: Diagnosis not present

## 2018-01-22 DIAGNOSIS — D631 Anemia in chronic kidney disease: Secondary | ICD-10-CM | POA: Diagnosis not present

## 2018-01-22 DIAGNOSIS — R809 Proteinuria, unspecified: Secondary | ICD-10-CM | POA: Diagnosis not present

## 2018-02-04 DIAGNOSIS — C801 Malignant (primary) neoplasm, unspecified: Secondary | ICD-10-CM

## 2018-02-04 HISTORY — DX: Malignant (primary) neoplasm, unspecified: C80.1

## 2018-04-10 ENCOUNTER — Other Ambulatory Visit (INDEPENDENT_AMBULATORY_CARE_PROVIDER_SITE_OTHER): Payer: Self-pay

## 2018-04-10 DIAGNOSIS — I6523 Occlusion and stenosis of bilateral carotid arteries: Secondary | ICD-10-CM

## 2018-04-15 ENCOUNTER — Other Ambulatory Visit (INDEPENDENT_AMBULATORY_CARE_PROVIDER_SITE_OTHER): Payer: Medicare HMO

## 2018-04-15 ENCOUNTER — Other Ambulatory Visit (INDEPENDENT_AMBULATORY_CARE_PROVIDER_SITE_OTHER): Payer: Self-pay | Admitting: Vascular Surgery

## 2018-04-15 ENCOUNTER — Encounter (INDEPENDENT_AMBULATORY_CARE_PROVIDER_SITE_OTHER): Payer: Medicare HMO

## 2018-04-15 ENCOUNTER — Other Ambulatory Visit: Payer: Self-pay | Admitting: Family Medicine

## 2018-04-15 ENCOUNTER — Ambulatory Visit (INDEPENDENT_AMBULATORY_CARE_PROVIDER_SITE_OTHER): Payer: Medicare HMO | Admitting: Vascular Surgery

## 2018-04-15 DIAGNOSIS — I712 Thoracic aortic aneurysm, without rupture, unspecified: Secondary | ICD-10-CM

## 2018-04-15 DIAGNOSIS — Z1231 Encounter for screening mammogram for malignant neoplasm of breast: Secondary | ICD-10-CM

## 2018-05-13 ENCOUNTER — Ambulatory Visit: Payer: Medicare HMO

## 2018-05-14 ENCOUNTER — Ambulatory Visit
Admission: RE | Admit: 2018-05-14 | Discharge: 2018-05-14 | Disposition: A | Discharge status: Home / Self Care | Payer: Medicare HMO | Source: Ambulatory Visit | Attending: Vascular Surgery | Admitting: Vascular Surgery

## 2018-05-14 DIAGNOSIS — I6523 Occlusion and stenosis of bilateral carotid arteries: Secondary | ICD-10-CM | POA: Diagnosis present

## 2018-05-17 ENCOUNTER — Telehealth (INDEPENDENT_AMBULATORY_CARE_PROVIDER_SITE_OTHER): Payer: Self-pay | Admitting: Vascular Surgery

## 2018-05-17 NOTE — Telephone Encounter (Signed)
Which ct you want done for the patient.Please Advise!

## 2018-05-17 NOTE — Telephone Encounter (Signed)
Please refer to my April 11, 2018 orders for this.   The patient should call the radiology department and find out where it needs to be scheduled.

## 2018-05-20 NOTE — Telephone Encounter (Signed)
CT is in the process of getting authorized and I inform the patient sister that the centralizing scheduling should be giving her a call to schedule appointment

## 2018-05-27 ENCOUNTER — Telehealth (INDEPENDENT_AMBULATORY_CARE_PROVIDER_SITE_OTHER): Payer: Self-pay

## 2018-05-27 ENCOUNTER — Encounter (INDEPENDENT_AMBULATORY_CARE_PROVIDER_SITE_OTHER): Payer: Self-pay

## 2018-05-27 ENCOUNTER — Other Ambulatory Visit (INDEPENDENT_AMBULATORY_CARE_PROVIDER_SITE_OTHER): Payer: Self-pay | Admitting: Vascular Surgery

## 2018-05-27 DIAGNOSIS — I712 Thoracic aortic aneurysm, without rupture, unspecified: Secondary | ICD-10-CM

## 2018-05-27 NOTE — Telephone Encounter (Signed)
Spoke with Zettie Pho regarding the patient having her CT at the hospital that was scheduled and the patient needs a dye allergy protocol called into her pharmacy. This was called in for Prednisone 50mg  3 tabs taken at 13hrs ,7hrs and day of procedure, Benadryl 25mg  2 tabs, taken 1 day before and 1 day of procedure and Zantac 150mg  1 tab taken day of procedure.

## 2018-05-29 ENCOUNTER — Ambulatory Visit: Payer: Medicare HMO

## 2018-06-07 ENCOUNTER — Ambulatory Visit
Admission: RE | Admit: 2018-06-07 | Discharge: 2018-06-07 | Disposition: A | Discharge status: Home / Self Care | Payer: Medicare HMO | Source: Ambulatory Visit | Attending: Vascular Surgery | Admitting: Vascular Surgery

## 2018-06-07 ENCOUNTER — Other Ambulatory Visit (INDEPENDENT_AMBULATORY_CARE_PROVIDER_SITE_OTHER): Payer: Self-pay | Admitting: Vascular Surgery

## 2018-06-07 DIAGNOSIS — I712 Thoracic aortic aneurysm, without rupture, unspecified: Secondary | ICD-10-CM

## 2018-06-07 DIAGNOSIS — I700 Atherosclerosis of aorta: Secondary | ICD-10-CM | POA: Insufficient documentation

## 2018-06-07 NOTE — Telephone Encounter (Signed)
Radiology called stating the patient has refused to have her CT procedure due to her nephrologist telling her not to have dye. The patient has been given the dye allergy protocol of Prednisone 50 mg #3 tabs one taken at 13 hrs, 7 hrs and day of procedure , Benadryl 25 mg #2 tabs one taken day before procedure and day of procedure and Zantac 150 mg #1 tab, one tab taken day of procedure. The patient did take the dye allergy protocol. I was asked for a verbal that the patient can have her CT without dye or not have the CT at all. I gave the okay for the patient to decide which she would like to do. Hezzie Bump PA returned an on call message back to radiology and the patient has decided to have her CT without dye. This will not give a clear picture for the doctor.

## 2018-06-11 ENCOUNTER — Ambulatory Visit
Admission: RE | Admit: 2018-06-11 | Discharge: 2018-06-11 | Disposition: A | Discharge status: Home / Self Care | Payer: Medicare HMO | Source: Ambulatory Visit | Attending: Family Medicine | Admitting: Family Medicine

## 2018-06-11 DIAGNOSIS — Z1231 Encounter for screening mammogram for malignant neoplasm of breast: Secondary | ICD-10-CM | POA: Diagnosis not present

## 2018-06-11 HISTORY — DX: Malignant (primary) neoplasm, unspecified: C80.1

## 2018-06-11 HISTORY — DX: Personal history of antineoplastic chemotherapy: Z92.21

## 2018-06-27 ENCOUNTER — Ambulatory Visit (INDEPENDENT_AMBULATORY_CARE_PROVIDER_SITE_OTHER): Payer: Medicare HMO | Admitting: Vascular Surgery

## 2018-08-12 ENCOUNTER — Encounter (INDEPENDENT_AMBULATORY_CARE_PROVIDER_SITE_OTHER): Payer: Medicare HMO

## 2018-08-12 ENCOUNTER — Other Ambulatory Visit (INDEPENDENT_AMBULATORY_CARE_PROVIDER_SITE_OTHER): Payer: Self-pay | Admitting: Vascular Surgery

## 2018-08-12 ENCOUNTER — Ambulatory Visit (INDEPENDENT_AMBULATORY_CARE_PROVIDER_SITE_OTHER): Payer: Medicare HMO | Admitting: Vascular Surgery

## 2018-08-12 ENCOUNTER — Other Ambulatory Visit (INDEPENDENT_AMBULATORY_CARE_PROVIDER_SITE_OTHER): Payer: Medicare HMO

## 2018-08-12 DIAGNOSIS — I714 Abdominal aortic aneurysm, without rupture, unspecified: Secondary | ICD-10-CM

## 2019-04-09 ENCOUNTER — Other Ambulatory Visit
Admission: RE | Admit: 2019-04-09 | Discharge: 2019-04-09 | Disposition: A | Discharge status: Home / Self Care | Payer: Medicare HMO | Source: Ambulatory Visit | Attending: Orthopedic Surgery | Admitting: Orthopedic Surgery

## 2019-04-09 ENCOUNTER — Other Ambulatory Visit: Payer: Self-pay

## 2019-04-09 DIAGNOSIS — Z1159 Encounter for screening for other viral diseases: Secondary | ICD-10-CM | POA: Insufficient documentation

## 2019-04-09 LAB — SARS CORONAVIRUS 2 BY RT PCR (HOSPITAL ORDER, PERFORMED IN ~~LOC~~ HOSPITAL LAB): SARS Coronavirus 2: NEGATIVE

## 2019-04-09 MED ORDER — CEFAZOLIN SODIUM-DEXTROSE 2-4 GM/100ML-% IV SOLN
2.0000 g | Freq: Once | INTRAVENOUS | Status: AC
  Administered 2019-04-10: 2 g via INTRAVENOUS

## 2019-04-10 ENCOUNTER — Ambulatory Visit: Payer: Medicare HMO | Admitting: Anesthesiology

## 2019-04-10 ENCOUNTER — Ambulatory Visit: Payer: Medicare HMO

## 2019-04-10 ENCOUNTER — Encounter: Payer: Self-pay | Admitting: *Deleted

## 2019-04-10 ENCOUNTER — Other Ambulatory Visit: Payer: Self-pay

## 2019-04-10 ENCOUNTER — Encounter
Admission: RE | Disposition: A | Discharge status: Home / Self Care | Payer: Self-pay | Source: Home / Self Care | Attending: Orthopedic Surgery

## 2019-04-10 ENCOUNTER — Ambulatory Visit
Admission: RE | Admit: 2019-04-10 | Discharge: 2019-04-10 | Disposition: A | Discharge status: Home / Self Care | Payer: Medicare HMO | Attending: Orthopedic Surgery | Admitting: Orthopedic Surgery

## 2019-04-10 DIAGNOSIS — K219 Gastro-esophageal reflux disease without esophagitis: Secondary | ICD-10-CM | POA: Diagnosis not present

## 2019-04-10 DIAGNOSIS — Z79899 Other long term (current) drug therapy: Secondary | ICD-10-CM | POA: Diagnosis not present

## 2019-04-10 DIAGNOSIS — J449 Chronic obstructive pulmonary disease, unspecified: Secondary | ICD-10-CM | POA: Insufficient documentation

## 2019-04-10 DIAGNOSIS — F419 Anxiety disorder, unspecified: Secondary | ICD-10-CM | POA: Insufficient documentation

## 2019-04-10 DIAGNOSIS — Z9221 Personal history of antineoplastic chemotherapy: Secondary | ICD-10-CM | POA: Diagnosis not present

## 2019-04-10 DIAGNOSIS — Z87891 Personal history of nicotine dependence: Secondary | ICD-10-CM | POA: Diagnosis not present

## 2019-04-10 DIAGNOSIS — C969 Malignant neoplasm of lymphoid, hematopoietic and related tissue, unspecified: Secondary | ICD-10-CM | POA: Insufficient documentation

## 2019-04-10 DIAGNOSIS — Z79890 Hormone replacement therapy (postmenopausal): Secondary | ICD-10-CM | POA: Insufficient documentation

## 2019-04-10 DIAGNOSIS — E039 Hypothyroidism, unspecified: Secondary | ICD-10-CM | POA: Diagnosis not present

## 2019-04-10 DIAGNOSIS — I6523 Occlusion and stenosis of bilateral carotid arteries: Secondary | ICD-10-CM | POA: Diagnosis not present

## 2019-04-10 DIAGNOSIS — F329 Major depressive disorder, single episode, unspecified: Secondary | ICD-10-CM | POA: Diagnosis not present

## 2019-04-10 DIAGNOSIS — I10 Essential (primary) hypertension: Secondary | ICD-10-CM | POA: Insufficient documentation

## 2019-04-10 DIAGNOSIS — X58XXXA Exposure to other specified factors, initial encounter: Secondary | ICD-10-CM | POA: Insufficient documentation

## 2019-04-10 DIAGNOSIS — S32030A Wedge compression fracture of third lumbar vertebra, initial encounter for closed fracture: Secondary | ICD-10-CM | POA: Diagnosis present

## 2019-04-10 DIAGNOSIS — Z419 Encounter for procedure for purposes other than remedying health state, unspecified: Secondary | ICD-10-CM

## 2019-04-10 HISTORY — PX: KYPHOPLASTY: SHX5884

## 2019-04-10 SURGERY — KYPHOPLASTY
Anesthesia: General | Site: Back

## 2019-04-10 MED ORDER — HYDROCODONE-ACETAMINOPHEN 5-325 MG PO TABS
1.0000 | ORAL_TABLET | ORAL | Status: DC | PRN
  Administered 2019-04-10: 21:00:00 1 via ORAL

## 2019-04-10 MED ORDER — FENTANYL CITRATE (PF) 100 MCG/2ML IJ SOLN
25.0000 ug | INTRAMUSCULAR | Status: AC | PRN
  Administered 2019-04-10 (×6): 25 ug via INTRAVENOUS

## 2019-04-10 MED ORDER — PROPOFOL 10 MG/ML IV BOLUS
INTRAVENOUS | Status: AC
  Filled 2019-04-10: qty 20

## 2019-04-10 MED ORDER — BUPIVACAINE-EPINEPHRINE (PF) 0.5% -1:200000 IJ SOLN
INTRAMUSCULAR | Status: DC | PRN
  Administered 2019-04-10: 10 mL via PERINEURAL

## 2019-04-10 MED ORDER — ONDANSETRON HCL 4 MG/2ML IJ SOLN
INTRAMUSCULAR | Status: DC | PRN
  Administered 2019-04-10: 4 mg via INTRAVENOUS

## 2019-04-10 MED ORDER — IPRATROPIUM-ALBUTEROL 0.5-2.5 (3) MG/3ML IN SOLN
RESPIRATORY_TRACT | Status: AC
  Administered 2019-04-10: 16:00:00 3 mL via RESPIRATORY_TRACT
  Filled 2019-04-10: qty 3

## 2019-04-10 MED ORDER — HYDROCODONE-ACETAMINOPHEN 5-325 MG PO TABS
ORAL_TABLET | ORAL | Status: AC
  Filled 2019-04-10: qty 1

## 2019-04-10 MED ORDER — ONDANSETRON HCL 4 MG/2ML IJ SOLN
INTRAMUSCULAR | Status: AC
  Filled 2019-04-10: qty 2

## 2019-04-10 MED ORDER — PROPOFOL 500 MG/50ML IV EMUL
INTRAVENOUS | Status: DC | PRN
  Administered 2019-04-10: 10 ug/kg/min via INTRAVENOUS

## 2019-04-10 MED ORDER — KETAMINE HCL 50 MG/ML IJ SOLN
INTRAMUSCULAR | Status: AC
  Filled 2019-04-10: qty 10

## 2019-04-10 MED ORDER — MIDAZOLAM HCL 2 MG/2ML IJ SOLN
INTRAMUSCULAR | Status: AC
  Filled 2019-04-10: qty 2

## 2019-04-10 MED ORDER — CEFAZOLIN SODIUM-DEXTROSE 2-4 GM/100ML-% IV SOLN
INTRAVENOUS | Status: AC
  Filled 2019-04-10: qty 100

## 2019-04-10 MED ORDER — IOPAMIDOL (ISOVUE-M 200) INJECTION 41%
INTRAMUSCULAR | Status: DC | PRN
  Administered 2019-04-10: 10 mL

## 2019-04-10 MED ORDER — LIDOCAINE HCL 1 % IJ SOLN
INTRAMUSCULAR | Status: DC | PRN
  Administered 2019-04-10 (×2): 10 mL via INTRADERMAL

## 2019-04-10 MED ORDER — MIDAZOLAM HCL 2 MG/2ML IJ SOLN
INTRAMUSCULAR | Status: DC | PRN
  Administered 2019-04-10 (×2): 1 mg via INTRAVENOUS

## 2019-04-10 MED ORDER — LACTATED RINGERS IV SOLN
INTRAVENOUS | Status: DC
  Administered 2019-04-10: 12:00:00 via INTRAVENOUS

## 2019-04-10 MED ORDER — IPRATROPIUM-ALBUTEROL 0.5-2.5 (3) MG/3ML IN SOLN
3.0000 mL | Freq: Once | RESPIRATORY_TRACT | Status: AC
  Administered 2019-04-10: 16:00:00 3 mL via RESPIRATORY_TRACT

## 2019-04-10 MED ORDER — FENTANYL CITRATE (PF) 100 MCG/2ML IJ SOLN
INTRAMUSCULAR | Status: AC
  Administered 2019-04-10: 20:00:00 25 ug via INTRAVENOUS
  Filled 2019-04-10: qty 2

## 2019-04-10 MED ORDER — KETAMINE HCL 50 MG/ML IJ SOLN
INTRAMUSCULAR | Status: DC | PRN
  Administered 2019-04-10 (×3): 25 mg via INTRAMUSCULAR

## 2019-04-10 MED ORDER — FENTANYL CITRATE (PF) 100 MCG/2ML IJ SOLN
INTRAMUSCULAR | Status: AC
  Administered 2019-04-10: 21:00:00 25 ug via INTRAVENOUS
  Filled 2019-04-10: qty 2

## 2019-04-10 SURGICAL SUPPLY — 19 items
CEMENT KYPHON CX01A KIT/MIXER (Cement) ×2 IMPLANT
COVER WAND RF STERILE (DRAPES) ×1 IMPLANT
DERMABOND ADVANCED (GAUZE/BANDAGES/DRESSINGS) ×1
DERMABOND ADVANCED .7 DNX12 (GAUZE/BANDAGES/DRESSINGS) ×1 IMPLANT
DEVICE BIOPSY BONE KYPHX (INSTRUMENTS) ×2 IMPLANT
DRAPE C-ARM XRAY 36X54 (DRAPES) ×2 IMPLANT
DURAPREP 26ML APPLICATOR (WOUND CARE) ×2 IMPLANT
FEE RENTAL RFA GENERATOR (MISCELLANEOUS) IMPLANT
GLOVE SURG SYN 9.0  PF PI (GLOVE) ×1
GLOVE SURG SYN 9.0 PF PI (GLOVE) ×1 IMPLANT
GOWN SRG 2XL LVL 4 RGLN SLV (GOWNS) ×1 IMPLANT
GOWN STRL NON-REIN 2XL LVL4 (GOWNS) ×1
GOWN STRL REUS W/ TWL LRG LVL3 (GOWN DISPOSABLE) ×1 IMPLANT
GOWN STRL REUS W/TWL LRG LVL3 (GOWN DISPOSABLE) ×1
PACK KYPHOPLASTY (MISCELLANEOUS) ×2 IMPLANT
RENTAL RFA GENERATOR (MISCELLANEOUS) IMPLANT
STRAP SAFETY 5IN WIDE (MISCELLANEOUS) ×2 IMPLANT
TRAY KYPHOPAK 15/3 EXPRESS 1ST (MISCELLANEOUS) ×1 IMPLANT
TRAY KYPHOPAK 20/3 EXPRESS 1ST (MISCELLANEOUS) ×2 IMPLANT

## 2019-04-10 NOTE — Op Note (Signed)
Date April 10, 2019  time 8:16 PM   PATIENT:  Veronica Hubbard  PRE-OPERATIVE DIAGNOSIS:  closed wedge compression fracture of L3   POST-OPERATIVE DIAGNOSIS:  closed wedge compression fracture of L3   PROCEDURE:  Procedure(s): KYPHOPLASTY L3  SURGEON: Laurene Footman, MD   ASSISTANTS: None   ANESTHESIA:   local and MAC   EBL:  No intake/output data recorded.   BLOOD ADMINISTERED:none   DRAINS: none    LOCAL MEDICATIONS USED:  MARCAINE    and XYLOCAINE    SPECIMEN:   L3 vertebral body biopsy   DISPOSITION OF SPECIMEN:  Pathology   COUNTS:  YES   TOURNIQUET:  * No tourniquets in log *   IMPLANTS: Bone cement   DICTATION: .Dragon Dictation  patient was brought to the operating room and after adequate anesthesia was obtained the patient was placed prone.  C arm was brought in in good visualization of the affected level obtained on both AP and lateral projections.  After patient identification and timeout procedures were completed, local anesthetic was infiltrated with 10 cc 1% Xylocaine infiltrated subcutaneously.  This is done the area on the right side of the planned approach.  The back was then prepped and draped in the usual sterile manner and repeat timeout procedure carried out.  A spinal needle was brought down to the pedicle on the right side of  L3 and a 50-50 mix of 1% Xylocaine half percent Sensorcaine with epinephrine total of 20 cc injected.  After allowing this to set a small incision was made and the trocar was advanced into the vertebral body in an extrapedicular fashion.  Biopsy was obtained Drilling was carried out balloon inserted with inflation to  3 cc.  When the cement was appropriate consistency for cc were injected into the vertebral body with with some extravasation into the L3-4 disc space, good fill r along the inferior body and inferior endplates and from right to left sides along the inferior endplate.  After the cement had set the trochar was removed and  permanent C-arm views obtained.  The wound was closed with Dermabond followed by Band-Aid   PLAN OF CARE: Discharge to home after PACU   PATIENT DISPOSITION:  PACU - hemodynamically stable.

## 2019-04-10 NOTE — Transfer of Care (Signed)
Immediate Anesthesia Transfer of Care Note  Patient: Veronica Hubbard  Procedure(s) Performed: KYPHOPLASTY L3 (N/A Back)  Patient Location: PACU  Anesthesia Type:MAC  Level of Consciousness: awake  Airway & Oxygen Therapy: Patient Spontanous Breathing  Post-op Assessment: Post -op Vital signs reviewed and stable   Post vital signs: stable  Last Vitals:  Vitals Value Taken Time  BP 167/109 04/10/2019  8:20 PM  Temp 37.2 C 04/10/2019  8:20 PM  Pulse 80 04/10/2019  8:21 PM  Resp 18 04/10/2019  8:21 PM  SpO2 98 % 04/10/2019  8:21 PM  Vitals shown include unvalidated device data.  Last Pain:  Vitals:   04/10/19 2020  TempSrc: Temporal  PainSc:          Complications: No apparent anesthesia complications

## 2019-04-10 NOTE — Anesthesia Post-op Follow-up Note (Signed)
Anesthesia QCDR form completed.        

## 2019-04-10 NOTE — Anesthesia Postprocedure Evaluation (Signed)
Anesthesia Post Note  Patient: Kammi Hechler  Procedure(s) Performed: KYPHOPLASTY L3 (N/A Back)  Patient location during evaluation: PACU Anesthesia Type: General Level of consciousness: awake and alert Pain management: pain level controlled Vital Signs Assessment: post-procedure vital signs reviewed and stable Respiratory status: spontaneous breathing, nonlabored ventilation, respiratory function stable and patient connected to nasal cannula oxygen Cardiovascular status: blood pressure returned to baseline and stable Postop Assessment: no apparent nausea or vomiting Anesthetic complications: no     Last Vitals:  Vitals:   04/10/19 2050 04/10/19 2105  BP: (!) 157/84 (!) 157/83  Pulse: 70 60  Resp: 16 16  Temp: 36.6 C   SpO2: 94% 99%    Last Pain:  Vitals:   04/10/19 2050  TempSrc:   PainSc: 6                  Precious Haws Piscitello

## 2019-04-10 NOTE — H&P (Signed)
Reviewed paper H+P, will be scanned into chart. No changes noted.  

## 2019-04-10 NOTE — Anesthesia Preprocedure Evaluation (Addendum)
Anesthesia Evaluation  Patient identified by MRN, date of birth, ID band Patient awake    Reviewed: Allergy & Precautions, H&P , NPO status , Patient's Chart, lab work & pertinent test results  History of Anesthesia Complications (+) PONV, PSEUDOCHOLINESTERASE DEFICIENCY and history of anesthetic complications (Pt does not know anything about pseudocholinesterase deficiency but diagnosis is in chart)  Airway Mallampati: II  TM Distance: >3 FB     Dental  (+) Upper Dentures, Missing   Pulmonary shortness of breath and Long-Term Oxygen Therapy, asthma , COPD (2L O2 at night),  COPD inhaler and oxygen dependent, neg recent URI, Current Smoker, former smoker,     + decreased breath sounds+ wheezing (scattered wheeze)      Cardiovascular Exercise Tolerance: Poor hypertension, (-) angina(-) Past MI (-) dysrhythmias  Rhythm:regular Rate:Normal  AAA, 4.1 cm, stable   Neuro/Psych PSYCHIATRIC DISORDERS Depression Spinal stenosis    GI/Hepatic Neg liver ROS, GERD  ,  Endo/Other  Hypothyroidism   Renal/GU negative Renal ROS  negative genitourinary   Musculoskeletal   Abdominal   Peds  Hematology negative hematology ROS (+)   Anesthesia Other Findings Past Medical History: No date: Asthma 02/2018: Cancer ([place])     Comment:  bone marrow cancer No date: Depression No date: GERD (gastroesophageal reflux disease) No date: Hypertension No date: Hypothyroidism No date: Personal history of chemotherapy No date: Spinal stenosis  Past Surgical History: No date: ABDOMINAL HYSTERECTOMY No date: BACK SURGERY 2003: BREAST BIOPSY; Left     Comment:  neg No date: cataracts No date: lipoma removal No date: SHOULDER SURGERY  BMI    Body Mass Index:  26.57 kg/m      Reproductive/Obstetrics negative OB ROS                         Anesthesia Physical Anesthesia Plan  ASA: III  Anesthesia Plan: General    Post-op Pain Management:    Induction:   PONV Risk Score and Plan: Propofol infusion and TIVA  Airway Management Planned: Simple Face Mask and Natural Airway  Additional Equipment:   Intra-op Plan:   Post-operative Plan:   Informed Consent: I have reviewed the patients History and Physical, chart, labs and discussed the procedure including the risks, benefits and alternatives for the proposed anesthesia with the patient or authorized representative who has indicated his/her understanding and acceptance.     Dental Advisory Given  Plan Discussed with: Anesthesiologist and CRNA  Anesthesia Plan Comments: (Duoneb treatment pre-op Pt states breathing is at baseline)       Anesthesia Quick Evaluation

## 2019-04-10 NOTE — Discharge Instructions (Signed)
Take it easy tonight and tomorrow and resume more normal activities on Saturday as tolerated.  Band-Aid can be removed on Saturday and then okay to shower.  Heat or ice as needed along with pain pills previously prescribed.  Call office if you are having problems

## 2019-04-14 ENCOUNTER — Encounter: Payer: Self-pay | Admitting: Orthopedic Surgery

## 2019-04-14 LAB — SURGICAL PATHOLOGY

## 2019-04-18 ENCOUNTER — Other Ambulatory Visit: Payer: Self-pay

## 2019-04-18 ENCOUNTER — Encounter: Payer: Self-pay | Admitting: Emergency Medicine

## 2019-04-18 ENCOUNTER — Inpatient Hospital Stay
Admission: EM | Admit: 2019-04-18 | Discharge: 2019-04-27 | DRG: 516 | Disposition: A | Discharge status: Skilled Nursing Facility | Payer: Medicare HMO | Attending: Internal Medicine | Admitting: Internal Medicine

## 2019-04-18 DIAGNOSIS — Z803 Family history of malignant neoplasm of breast: Secondary | ICD-10-CM

## 2019-04-18 DIAGNOSIS — Z833 Family history of diabetes mellitus: Secondary | ICD-10-CM

## 2019-04-18 DIAGNOSIS — R339 Retention of urine, unspecified: Secondary | ICD-10-CM | POA: Diagnosis not present

## 2019-04-18 DIAGNOSIS — E869 Volume depletion, unspecified: Secondary | ICD-10-CM

## 2019-04-18 DIAGNOSIS — R52 Pain, unspecified: Secondary | ICD-10-CM | POA: Diagnosis present

## 2019-04-18 DIAGNOSIS — Z8249 Family history of ischemic heart disease and other diseases of the circulatory system: Secondary | ICD-10-CM

## 2019-04-18 DIAGNOSIS — R0602 Shortness of breath: Secondary | ICD-10-CM

## 2019-04-18 DIAGNOSIS — F329 Major depressive disorder, single episode, unspecified: Secondary | ICD-10-CM | POA: Diagnosis present

## 2019-04-18 DIAGNOSIS — N390 Urinary tract infection, site not specified: Secondary | ICD-10-CM | POA: Diagnosis present

## 2019-04-18 DIAGNOSIS — K219 Gastro-esophageal reflux disease without esophagitis: Secondary | ICD-10-CM | POA: Diagnosis present

## 2019-04-18 DIAGNOSIS — S32040A Wedge compression fracture of fourth lumbar vertebra, initial encounter for closed fracture: Secondary | ICD-10-CM | POA: Diagnosis not present

## 2019-04-18 DIAGNOSIS — B952 Enterococcus as the cause of diseases classified elsewhere: Secondary | ICD-10-CM | POA: Diagnosis present

## 2019-04-18 DIAGNOSIS — I714 Abdominal aortic aneurysm, without rupture: Secondary | ICD-10-CM | POA: Diagnosis present

## 2019-04-18 DIAGNOSIS — M549 Dorsalgia, unspecified: Secondary | ICD-10-CM

## 2019-04-18 DIAGNOSIS — E039 Hypothyroidism, unspecified: Secondary | ICD-10-CM | POA: Diagnosis present

## 2019-04-18 DIAGNOSIS — Z886 Allergy status to analgesic agent status: Secondary | ICD-10-CM

## 2019-04-18 DIAGNOSIS — Z888 Allergy status to other drugs, medicaments and biological substances status: Secondary | ICD-10-CM

## 2019-04-18 DIAGNOSIS — M4800 Spinal stenosis, site unspecified: Secondary | ICD-10-CM | POA: Diagnosis present

## 2019-04-18 DIAGNOSIS — I129 Hypertensive chronic kidney disease with stage 1 through stage 4 chronic kidney disease, or unspecified chronic kidney disease: Secondary | ICD-10-CM | POA: Diagnosis present

## 2019-04-18 DIAGNOSIS — Z9221 Personal history of antineoplastic chemotherapy: Secondary | ICD-10-CM

## 2019-04-18 DIAGNOSIS — Z419 Encounter for procedure for purposes other than remedying health state, unspecified: Secondary | ICD-10-CM

## 2019-04-18 DIAGNOSIS — Z87891 Personal history of nicotine dependence: Secondary | ICD-10-CM

## 2019-04-18 DIAGNOSIS — R1032 Left lower quadrant pain: Secondary | ICD-10-CM

## 2019-04-18 DIAGNOSIS — N183 Chronic kidney disease, stage 3 (moderate): Secondary | ICD-10-CM | POA: Diagnosis present

## 2019-04-18 DIAGNOSIS — J45909 Unspecified asthma, uncomplicated: Secondary | ICD-10-CM | POA: Diagnosis present

## 2019-04-18 DIAGNOSIS — Z7951 Long term (current) use of inhaled steroids: Secondary | ICD-10-CM

## 2019-04-18 DIAGNOSIS — Z91041 Radiographic dye allergy status: Secondary | ICD-10-CM

## 2019-04-18 DIAGNOSIS — B370 Candidal stomatitis: Secondary | ICD-10-CM | POA: Diagnosis present

## 2019-04-18 DIAGNOSIS — D72829 Elevated white blood cell count, unspecified: Secondary | ICD-10-CM

## 2019-04-18 DIAGNOSIS — Z85830 Personal history of malignant neoplasm of bone: Secondary | ICD-10-CM

## 2019-04-18 DIAGNOSIS — Z885 Allergy status to narcotic agent status: Secondary | ICD-10-CM

## 2019-04-18 DIAGNOSIS — Z20828 Contact with and (suspected) exposure to other viral communicable diseases: Secondary | ICD-10-CM | POA: Diagnosis present

## 2019-04-18 DIAGNOSIS — Z806 Family history of leukemia: Secondary | ICD-10-CM

## 2019-04-18 DIAGNOSIS — Z79890 Hormone replacement therapy (postmenopausal): Secondary | ICD-10-CM

## 2019-04-18 NOTE — ED Triage Notes (Signed)
Patient brought in by ems from home. Patient fell last and had a L4 fracture. Patient states that she had surgery for the fracture. Patient states that she came in tonight fro increase back pain.

## 2019-04-18 NOTE — ED Notes (Addendum)
Pt reports unable to control pain at home, 2 days s/p DC from hosp after surg after lumbar fracture, pt reports last 7.5 oxycodone and tylenol at approx 1800.  Pt lives alone\  Pt with movement to feet, appears swollen - reports swelling is new, CMS intact

## 2019-04-19 ENCOUNTER — Emergency Department: Payer: Medicare HMO

## 2019-04-19 ENCOUNTER — Observation Stay: Payer: Medicare HMO

## 2019-04-19 DIAGNOSIS — R3914 Feeling of incomplete bladder emptying: Secondary | ICD-10-CM

## 2019-04-19 DIAGNOSIS — N390 Urinary tract infection, site not specified: Secondary | ICD-10-CM

## 2019-04-19 DIAGNOSIS — R52 Pain, unspecified: Secondary | ICD-10-CM | POA: Diagnosis present

## 2019-04-19 DIAGNOSIS — R338 Other retention of urine: Secondary | ICD-10-CM

## 2019-04-19 LAB — CBC WITH DIFFERENTIAL/PLATELET
Abs Immature Granulocytes: 1.2 10*3/uL — ABNORMAL HIGH (ref 0.00–0.07)
Band Neutrophils: 3 %
Basophils Absolute: 0 10*3/uL (ref 0.0–0.1)
Basophils Relative: 0 %
Eosinophils Absolute: 0.4 10*3/uL (ref 0.0–0.5)
Eosinophils Relative: 2 %
HCT: 42.4 % (ref 36.0–46.0)
Hemoglobin: 13.4 g/dL (ref 12.0–15.0)
Lymphocytes Relative: 11 %
Lymphs Abs: 2.1 10*3/uL (ref 0.7–4.0)
MCH: 27.3 pg (ref 26.0–34.0)
MCHC: 31.6 g/dL (ref 30.0–36.0)
MCV: 86.4 fL (ref 80.0–100.0)
Monocytes Absolute: 0.6 10*3/uL (ref 0.1–1.0)
Monocytes Relative: 3 %
Myelocytes: 4 %
Neutro Abs: 15.1 10*3/uL — ABNORMAL HIGH (ref 1.7–7.7)
Neutrophils Relative %: 75 %
Platelets: 311 10*3/uL (ref 150–400)
Promyelocytes Relative: 2 %
RBC: 4.91 MIL/uL (ref 3.87–5.11)
RDW: 20.7 % — ABNORMAL HIGH (ref 11.5–15.5)
Smear Review: UNDETERMINED
WBC: 19.4 10*3/uL — ABNORMAL HIGH (ref 4.0–10.5)
nRBC: 0.3 % — ABNORMAL HIGH (ref 0.0–0.2)

## 2019-04-19 LAB — URINALYSIS, ROUTINE W REFLEX MICROSCOPIC
Bilirubin Urine: NEGATIVE
Glucose, UA: NEGATIVE mg/dL
Ketones, ur: NEGATIVE mg/dL
Nitrite: NEGATIVE
Protein, ur: NEGATIVE mg/dL
Specific Gravity, Urine: 1.009 (ref 1.005–1.030)
Squamous Epithelial / HPF: NONE SEEN (ref 0–5)
pH: 6 (ref 5.0–8.0)

## 2019-04-19 LAB — BASIC METABOLIC PANEL
Anion gap: 8 (ref 5–15)
BUN: 20 mg/dL (ref 8–23)
CO2: 26 mmol/L (ref 22–32)
Calcium: 10.1 mg/dL (ref 8.9–10.3)
Chloride: 104 mmol/L (ref 98–111)
Creatinine, Ser: 1.24 mg/dL — ABNORMAL HIGH (ref 0.44–1.00)
GFR calc Af Amer: 47 mL/min — ABNORMAL LOW (ref >60)
GFR calc non Af Amer: 40 mL/min — ABNORMAL LOW (ref >60)
Glucose, Bld: 106 mg/dL — ABNORMAL HIGH (ref 70–99)
Potassium: 3.5 mmol/L (ref 3.5–5.1)
Sodium: 138 mmol/L (ref 135–145)

## 2019-04-19 LAB — TSH: TSH: 9.749 u[IU]/mL — ABNORMAL HIGH (ref 0.350–4.500)

## 2019-04-19 MED ORDER — VITAMIN C 500 MG PO TABS
1000.0000 mg | ORAL_TABLET | Freq: Every day | ORAL | Status: DC
  Administered 2019-04-19 – 2019-04-27 (×7): 1000 mg via ORAL
  Filled 2019-04-19 (×7): qty 2

## 2019-04-19 MED ORDER — NYSTATIN 100000 UNIT/ML MT SUSP
5.0000 mL | Freq: Four times a day (QID) | OROMUCOSAL | Status: DC
  Administered 2019-04-19 – 2019-04-27 (×27): 500000 [IU] via ORAL
  Filled 2019-04-19 (×27): qty 5

## 2019-04-19 MED ORDER — ONDANSETRON HCL 4 MG/2ML IJ SOLN
4.0000 mg | INTRAMUSCULAR | Status: AC
  Administered 2019-04-19: 4 mg via INTRAVENOUS
  Filled 2019-04-19: qty 2

## 2019-04-19 MED ORDER — ENOXAPARIN SODIUM 40 MG/0.4ML ~~LOC~~ SOLN
40.0000 mg | SUBCUTANEOUS | Status: DC
  Administered 2019-04-19: 40 mg via SUBCUTANEOUS
  Filled 2019-04-19 (×2): qty 0.4

## 2019-04-19 MED ORDER — MORPHINE SULFATE (PF) 2 MG/ML IV SOLN
2.0000 mg | Freq: Once | INTRAVENOUS | Status: AC
  Administered 2019-04-19: 03:00:00 2 mg via INTRAVENOUS
  Filled 2019-04-19: qty 1

## 2019-04-19 MED ORDER — ACETAMINOPHEN 325 MG PO TABS
650.0000 mg | ORAL_TABLET | Freq: Four times a day (QID) | ORAL | Status: DC | PRN
  Administered 2019-04-23: 650 mg via ORAL
  Filled 2019-04-19: qty 2

## 2019-04-19 MED ORDER — ONDANSETRON HCL 4 MG/2ML IJ SOLN: 4.0000 mg | Freq: Four times a day (QID) | INTRAMUSCULAR | Status: DC | PRN

## 2019-04-19 MED ORDER — LEVOTHYROXINE SODIUM 50 MCG PO TABS
150.0000 ug | ORAL_TABLET | Freq: Every day | ORAL | Status: DC
  Administered 2019-04-20 – 2019-04-27 (×7): 150 ug via ORAL
  Filled 2019-04-19 (×7): qty 1

## 2019-04-19 MED ORDER — ACETAMINOPHEN 650 MG RE SUPP: 650.0000 mg | Freq: Four times a day (QID) | RECTAL | Status: DC | PRN

## 2019-04-19 MED ORDER — DOCUSATE SODIUM 100 MG PO CAPS
100.0000 mg | ORAL_CAPSULE | Freq: Two times a day (BID) | ORAL | Status: DC
  Administered 2019-04-19 – 2019-04-26 (×13): 100 mg via ORAL
  Filled 2019-04-19 (×13): qty 1

## 2019-04-19 MED ORDER — UMECLIDINIUM BROMIDE 62.5 MCG/INH IN AEPB
1.0000 | INHALATION_SPRAY | Freq: Every day | RESPIRATORY_TRACT | Status: DC
  Administered 2019-04-19 – 2019-04-26 (×8): 1 via RESPIRATORY_TRACT
  Filled 2019-04-19: qty 7

## 2019-04-19 MED ORDER — HYDRALAZINE HCL 25 MG PO TABS
25.0000 mg | ORAL_TABLET | Freq: Three times a day (TID) | ORAL | Status: DC
  Administered 2019-04-19 – 2019-04-20 (×3): 25 mg via ORAL
  Filled 2019-04-19 (×2): qty 1

## 2019-04-19 MED ORDER — LEVOTHYROXINE SODIUM 112 MCG PO TABS
112.0000 ug | ORAL_TABLET | Freq: Every day | ORAL | Status: DC
  Filled 2019-04-19: qty 1

## 2019-04-19 MED ORDER — FLUTICASONE FUROATE-VILANTEROL 100-25 MCG/INH IN AEPB
1.0000 | INHALATION_SPRAY | Freq: Every day | RESPIRATORY_TRACT | Status: DC
  Administered 2019-04-19 – 2019-04-27 (×9): 1 via RESPIRATORY_TRACT
  Filled 2019-04-19: qty 28

## 2019-04-19 MED ORDER — MORPHINE SULFATE (PF) 2 MG/ML IV SOLN
1.0000 mg | INTRAVENOUS | Status: DC | PRN
  Administered 2019-04-19 – 2019-04-21 (×4): 1 mg via INTRAVENOUS
  Filled 2019-04-19 (×4): qty 1

## 2019-04-19 MED ORDER — OMEGA-3-ACID ETHYL ESTERS 1 G PO CAPS
1.0000 g | ORAL_CAPSULE | Freq: Every day | ORAL | Status: DC
  Administered 2019-04-19 – 2019-04-26 (×8): 1 g via ORAL
  Filled 2019-04-19 (×8): qty 1

## 2019-04-19 MED ORDER — CITALOPRAM HYDROBROMIDE 20 MG PO TABS
20.0000 mg | ORAL_TABLET | Freq: Every day | ORAL | Status: DC
  Administered 2019-04-19 – 2019-04-27 (×7): 20 mg via ORAL
  Filled 2019-04-19 (×7): qty 1

## 2019-04-19 MED ORDER — OXYCODONE HCL 5 MG PO TABS
7.5000 mg | ORAL_TABLET | Freq: Four times a day (QID) | ORAL | Status: DC | PRN
  Administered 2019-04-19 – 2019-04-23 (×11): 7.5 mg via ORAL
  Filled 2019-04-19 (×12): qty 2

## 2019-04-19 MED ORDER — VITAMIN D 25 MCG (1000 UNIT) PO TABS
1000.0000 [IU] | ORAL_TABLET | Freq: Every day | ORAL | Status: DC
  Administered 2019-04-19 – 2019-04-27 (×7): 1000 [IU] via ORAL
  Filled 2019-04-19 (×7): qty 1

## 2019-04-19 MED ORDER — SODIUM CHLORIDE 0.9 % IV SOLN
INTRAVENOUS | Status: DC
  Administered 2019-04-19 – 2019-04-22 (×2): via INTRAVENOUS

## 2019-04-19 MED ORDER — ISOSORBIDE MONONITRATE ER 30 MG PO TB24
30.0000 mg | ORAL_TABLET | Freq: Every day | ORAL | Status: DC
  Administered 2019-04-19 – 2019-04-27 (×5): 30 mg via ORAL
  Filled 2019-04-19 (×6): qty 1

## 2019-04-19 MED ORDER — FLUTICASONE-UMECLIDIN-VILANT 100-62.5-25 MCG/INH IN AEPB: 1.0000 | INHALATION_SPRAY | Freq: Every day | RESPIRATORY_TRACT | Status: DC

## 2019-04-19 MED ORDER — ONDANSETRON HCL 4 MG PO TABS
4.0000 mg | ORAL_TABLET | Freq: Four times a day (QID) | ORAL | Status: DC | PRN
  Administered 2019-04-20 – 2019-04-25 (×3): 4 mg via ORAL
  Filled 2019-04-19 (×3): qty 1

## 2019-04-19 MED ORDER — ALBUTEROL SULFATE (2.5 MG/3ML) 0.083% IN NEBU
2.5000 mg | INHALATION_SOLUTION | RESPIRATORY_TRACT | Status: DC | PRN
  Administered 2019-04-22 (×2): 2.5 mg via RESPIRATORY_TRACT
  Filled 2019-04-19 (×2): qty 3

## 2019-04-19 NOTE — ED Notes (Signed)
Patient transported to X-ray 

## 2019-04-19 NOTE — ED Provider Notes (Signed)
[facility] Emergency Department Provider Note  ____________________________________________   First MD Initiated Contact with Patient 04/19/19 0021     (approximate)  I have reviewed the triage vital signs and the nursing notes.   HISTORY  Chief Complaint Back Pain    HPI Veronica Hubbard is a 84 y.o. female with extensive chronic medical history who just recently had L3 back surgery by Dr. Rudene Christians who presents by EMS for evaluation of back pain.  She says that she was discharged 2 days ago and that her pain was okay until tonight and then it became unbearable.  The oxycodone and Tylenol she has been taking have not helped.  Any amount of movement makes it much worse.  In addition to pain in her lower back, she is also having pain in her right side and indicates the right side of her hip.  She said the pain is severe and she cannot do anything including get out of bed.  She lives alone but her sister has been helping her.  She denies fever, sore throat although she says that her mouth and throat have been very dry, chest pain, shortness of breath, cough, nausea, vomiting, and abdominal pain.  She says she has not been doing well since her surgery.  She is adamant that she has not had any new falls since she got home.  She has some bruising around her eyes which she says was present when she came out of surgery.         Past Medical History:  Diagnosis Date  . Asthma   . Cancer ([place]) 02/2018   bone marrow cancer  . Depression   . GERD (gastroesophageal reflux disease)   . Hypertension   . Hypothyroidism   . Personal history of chemotherapy   . Spinal stenosis     Patient Active Problem List   Diagnosis Date Noted  . AAA (abdominal aortic aneurysm) without rupture ([place]) 04/09/2017  . Bilateral carotid artery stenosis 04/09/2017  . Thoracic aortic aneurysm without rupture ([place]) 04/09/2017  . Hyponatremia 02/23/2016  . Chest pain 02/23/2016  .  Gastroenteritis due to norovirus 02/09/2016    Past Surgical History:  Procedure Laterality Date  . ABDOMINAL HYSTERECTOMY    . BACK SURGERY    . BREAST BIOPSY Left 2003   neg  . cataracts    . KYPHOPLASTY N/A 04/10/2019   Procedure: KYPHOPLASTY L3;  Surgeon: Hessie Knows, MD;  Location: [facility] ORS;  Service: Orthopedics;  Laterality: N/A;  . lipoma removal    . SHOULDER SURGERY      Prior to Admission medications   Medication Sig Start Date End Date Taking? Authorizing Provider  acetaminophen (TYLENOL) 500 MG tablet Take 1,000 mg by mouth every 6 (six) hours as needed for mild pain or headache.    [provider]  albuterol (PROVENTIL HFA;VENTOLIN HFA) 108 (90 Base) MCG/ACT inhaler Inhale 2 puffs into the lungs every 4 (four) hours as needed for wheezing or shortness of breath.    [provider]  allopurinol (ZYLOPRIM) 100 MG tablet Take 200 mg by mouth daily. 05/04/18   [provider]  azithromycin (ZITHROMAX) 250 MG tablet Take 250 mg by mouth every Monday, Wednesday, and Friday. 05/13/18   [provider]  Cholecalciferol (VITAMIN D3) 25 MCG (1000 UT) CAPS Take 1,000 Units by mouth daily.    [provider]  citalopram (CELEXA) 20 MG tablet Take 20 mg by mouth daily.    [provider]  cyclobenzaprine (FLEXERIL) 5 MG tablet Take 5-10 mg by mouth at bedtime as needed for muscle spasms.    [provider]  ezetimibe (ZETIA) 10 MG tablet Take 10 mg by mouth daily.    [provider]  Fluticasone-Umeclidin-Vilant 100-62.5-25 MCG/INH AEPB Inhale 1 puff into the lungs daily.    [provider]  isosorbide mononitrate (IMDUR) 30 MG 24 hr tablet Take 30 mg by mouth daily.    [provider]  levothyroxine (SYNTHROID) 112 MCG tablet Take 112 mcg by mouth daily before breakfast.     [provider]  omega-3 acid ethyl esters (LOVAZA) 1 g capsule Take 1 g by mouth at bedtime.     [provider]  oxyCODONE HCl 7.5 MG TABA Take 7.5 mg by mouth every 6 (six) hours as needed (pain).    [provider]  pantoprazole (PROTONIX) 40 MG tablet Take 40 mg by mouth 2 (two) times daily.    [provider]  triamcinolone cream (KENALOG) 0.5 % Apply 1 application topically 2 (two) times daily as needed (for itching/rash).    [provider]    Allergies Aspirin, Celecoxib, Iodinated diagnostic agents, Losartan, Morphine, and Verapamil  Family History  Problem Relation Age of Onset  . Leukemia Mother   . CAD Father   . Diabetes Father   . Breast cancer Sister 42    Social History Social History   Tobacco Use  . Smoking status: Former Smoker    Types: Cigarettes  . Smokeless tobacco: Never Used  Substance Use Topics  . Alcohol use: No  . Drug use: No    Review of Systems Constitutional: No fever/chills Eyes: No visual changes. ENT: No sore throat. Cardiovascular: Denies chest pain. Respiratory: Denies shortness of breath. Gastrointestinal: No abdominal pain.  No nausea, no vomiting.  No diarrhea.  No constipation. Genitourinary: Negative for dysuria. Musculoskeletal: Severe pain in the back and the right side of her hip and flank. Integumentary: Negative for rash. Neurological: Negative for headaches, focal weakness or numbness.   ____________________________________________   PHYSICAL EXAM:  VITAL SIGNS: ED Triage Vitals [04/18/19 2348]  Enc Vitals Group     BP (!) 188/66     Pulse Rate 63     Resp 18     Temp 98.4 F (36.9 C)     Temp Source Oral     SpO2 96 %     Weight 64.9 kg (143 lb)     Height 1.6 m (5\' 3" )     Head Circumference      Peak Flow      Pain Score 9     Pain Loc      Pain Edu?      Excl. in [place]?     Constitutional: Alert, elderly, appears very uncomfortable. Eyes: Conjunctivae are normal.  Head: No acute injury, see skin exam below. Nose: No congestion/rhinnorhea. Mouth/Throat: Mucous  membranes are very dry. Neck: No stridor.  No meningeal signs.   Cardiovascular: Normal rate, regular rhythm. Good peripheral circulation. Grossly normal heart sounds. Respiratory: Normal respiratory effort.  No retractions. No audible wheezing. Gastrointestinal: Soft and nontender. No distention.  Musculoskeletal: The patient cannot roll onto her side without severe pain.  I am not able to evaluate the wound because she cannot tolerate moving onto her side at all, least prior to morphine administration.  She also indicates pain in her right side both above and including her right hip.  Any  amount of movement causes severe pain. Neurologic:  Normal speech and language. No gross focal neurologic deficits are appreciated.  Skin:  Skin is warm, dry and intact. No rash noted.  Subacute bruising around the patient's eyes particularly around the left eye that she says was present after her surgery.  Likely due to the prone position required of her back surgery. ____________________________________________   LABS (all labs ordered are listed, but only abnormal results are displayed)  Labs Reviewed  URINE CULTURE  BASIC METABOLIC PANEL  CBC WITH DIFFERENTIAL/PLATELET  URINALYSIS, ROUTINE W REFLEX MICROSCOPIC   ____________________________________________  RADIOLOGY I, Hinda Kehr, personally viewed and evaluated these images (plain radiographs) as part of my medical decision making, as well as reviewing the written report by the radiologist.  ED MD interpretation: Recent surgery at L3, questionable changes (endplate sclerosis) at L4.  CT scans do not demonstrate any acute abnormality but redemonstrate the L3 fracture and recent surgery, questionable involvement of L4 as well.  Official radiology report(s): Dg Lumbar Spine 2-3 Views  Result Date: 04/19/2019 CLINICAL DATA:  Pain status post fall. EXAM: LUMBAR SPINE - 2-3 VIEW COMPARISON:  None. FINDINGS: The patient has undergone L3 kyphoplasty.  Aortic calcifications are noted. There is a probable abdominal aortic aneurysm measuring approximately 4.5 cm. There is endplate sclerosis involving the L4 vertebral body. There is some sclerosis involving the inferior endplate of the L 2 vertebral body which is favored to be degenerative in etiology. There is grade 1 anterolisthesis of L3 on L4, likely degenerative. There are multilevel degenerative changes throughout the lumbar spine including advanced facet arthrosis at the lower lumbar segments. IMPRESSION: 1. There is mild endplate sclerosis involving the superior endplate of the L4 vertebral body. This is age indeterminate. Correlation with point tenderness and physical exam is recommended. If there is clinical concern for an acute fracture, follow-up with cross-sectional imaging is recommended. 2. Status post L3 kyphoplasty. 3. Probable abdominal aortic aneurysm measuring 4.5 cm. Follow-up with a nonemergent outpatient ultrasound is recommended for further evaluation. 4. Advanced degenerative changes throughout the lumbar spine. Electronically Signed   By: Constance Holster M.D.   On: 04/19/2019 01:32    ____________________________________________   PROCEDURES   Procedure(s) performed (including Critical Care):  Procedures   ____________________________________________   INITIAL IMPRESSION / MDM / [place] / ED COURSE  As part of my medical decision making, I reviewed the following data within the [facility] notes reviewed and incorporated, Labs reviewed , Old chart reviewed, Discussed with admitting physician (Dr. Marcille Blanco), Notes from prior ED visits and [place] Controlled Substance Database      *Knox Holdman was evaluated in Emergency Department on 04/19/2019 for the symptoms described in the history of present illness. She was evaluated in the context of the global COVID-19 pandemic, which necessitated consideration that the patient might  be at risk for infection with the SARS-CoV-2 virus that causes COVID-19. Institutional protocols and algorithms that pertain to the evaluation of patients at risk for COVID-19 are in a state of rapid change based on information released by regulatory bodies including the CDC and federal and state organizations. These policies and algorithms were followed during the patient's care in the ED.  Some ED evaluations and interventions may be delayed as a result of limited staffing during the pandemic.*  Differential diagnosis includes, but is not limited to, intractable pain, acute injury in the same area, kidney stones causing pain in her back and right flank, pelvic  fracture, failure to thrive, dehydration/renal failure.  The patient is not well appearing and although her vital signs are stable other than her blood pressure being elevated, I wonder about the possibility of renal failure and dehydration.  I cannot get much of an exam due to the amount of pain she is experiencing in spite of taking her home opioids.  She claims she has not had any new falls.  I have asked the nurse to obtain an in and out catheter urine specimen and will check basic lab work.  I am getting a CT scan of the abdomen and pelvis (renal protocol) with recons of the lumbar spine.  Even if no acute issues found, the patient may need admission for intractable pain, orthopedics consult, and PT/OT consult because she may benefit from going to rehab or a skilled nursing facility for a period of time; she currently can barely move much less take care of herself.  Clinical Course as of Apr 19 707  Sat Apr 19, 2019  0426 The patient has not been able to provide a urine specimen and did not tolerate the in and out catheterization.  I discussed the case with Dr. Marcille Blanco with the hospitalist service and explained the situation and he will admit her for further management.  I ordered a consult with orthopedics with Dr. Rudene Christians as well as a PT/OT  consults because the patient will likely need placement.  No evidence of acute infection at this time in spite of the chronically elevated white blood cell count.  [CF]    Clinical Course User Index [CF] Hinda Kehr, MD     ____________________________________________  FINAL CLINICAL IMPRESSION(S) / ED DIAGNOSES  Final diagnoses:  Back pain  Intractable back pain  Leukocytosis, unspecified type  Volume depletion     MEDICATIONS GIVEN DURING THIS VISIT:  Medications  enoxaparin (LOVENOX) injection 40 mg (has no administration in time range)  0.9 %  sodium chloride infusion ( Intravenous New Bag/Given 04/19/19 0617)  acetaminophen (TYLENOL) tablet 650 mg (has no administration in time range)    Or  acetaminophen (TYLENOL) suppository 650 mg (has no administration in time range)  docusate sodium (COLACE) capsule 100 mg (has no administration in time range)  ondansetron (ZOFRAN) tablet 4 mg (has no administration in time range)    Or  ondansetron (ZOFRAN) injection 4 mg (has no administration in time range)  morphine 2 MG/ML injection 1 mg (has no administration in time range)  oxyCODONE (Oxy IR/ROXICODONE) immediate release tablet 7.5 mg (has no administration in time range)  isosorbide mononitrate (IMDUR) 24 hr tablet 30 mg (has no administration in time range)  omega-3 acid ethyl esters (LOVAZA) capsule 1 g (has no administration in time range)  levothyroxine (SYNTHROID) tablet 112 mcg (has no administration in time range)  citalopram (CELEXA) tablet 20 mg (has no administration in time range)  cholecalciferol (VITAMIN D3) tablet 1,000 Units (has no administration in time range)  albuterol (PROVENTIL) (2.5 MG/3ML) 0.083% nebulizer solution 2.5 mg (has no administration in time range)  fluticasone furoate-vilanterol (BREO ELLIPTA) 100-25 MCG/INH 1 puff (has no administration in time range)    And  umeclidinium bromide (INCRUSE ELLIPTA) 62.5 MCG/INH 1 puff (has no  administration in time range)  nystatin (MYCOSTATIN) 100000 UNIT/ML suspension 500,000 Units (has no administration in time range)  vitamin C (ASCORBIC ACID) tablet 1,000 mg (has no administration in time range)  morphine 2 MG/ML injection 2 mg (2 mg Intravenous Given 04/19/19 0300)  ondansetron (ZOFRAN)  injection 4 mg (4 mg Intravenous Given 04/19/19 0258)     ED Discharge Orders    None       Note:  This document was prepared using Dragon voice recognition software and may include unintentional dictation errors.   Hinda Kehr, MD 04/19/19 [phone]

## 2019-04-19 NOTE — Plan of Care (Signed)

## 2019-04-19 NOTE — ED Notes (Signed)
Pt returned from DG att, NAD no needs verbalized, will continue to follow up

## 2019-04-19 NOTE — Progress Notes (Signed)
[facility] NAME: Veronica Hubbard    MR#:  [id number]  DATE OF BIRTH:  1935/08/30  SUBJECTIVE:  CHIEF COMPLAINT:   Chief Complaint  Patient presents with   Back Pain   This morning nursing staff reported patient having urinary retention with about 800 cc on bladder scan.  Nursing staff was unable to place Foley catheter.  Urology consultation was placed.  Later notified by nursing staff.patient voided about 600 cc on standing No fevers.  Currently no back pains.  REVIEW OF SYSTEMS:  Review of Systems  Constitutional: Negative for chills and fever.  HENT: Negative for hearing loss and tinnitus.   Eyes: Negative for blurred vision and double vision.  Respiratory: Negative for cough and hemoptysis.   Cardiovascular: Negative for chest pain and palpitations.  Gastrointestinal: Negative for heartburn and nausea.  Genitourinary: Negative for dysuria and urgency.  Musculoskeletal: Negative for myalgias and neck pain.  Skin: Negative for itching and rash.  Neurological: Negative for dizziness and headaches.  Psychiatric/Behavioral: Negative for depression and hallucinations.    DRUG ALLERGIES:   Allergies  Allergen Reactions   Aspirin Shortness Of Breath   Celecoxib Shortness Of Breath   Iodinated Diagnostic Agents Other (See Comments)    Reaction:  Decreased kidney function   Losartan Itching   Morphine Nausea And Vomiting   Verapamil Itching   VITALS:  Blood pressure (!) 157/146, pulse 66, temperature 98.2 F (36.8 C), temperature source Oral, resp. rate (!) 22, height 5\' 3"  (1.6 m), weight 65.7 kg, SpO2 97 %. PHYSICAL EXAMINATION:    Physical Exam  Constitutional: She is oriented to person, place, and time. She appears well-developed.  HENT:  Head: Normocephalic and atraumatic.  Right Ear: External ear normal.  Eyes: Pupils are equal, round, and reactive to light. Conjunctivae are normal. Right eye exhibits  no discharge.  Neck: Normal range of motion. Neck supple. No tracheal deviation present.  Cardiovascular: Normal rate, regular rhythm and normal heart sounds.  Respiratory: Effort normal. She has no wheezes.  GI: Soft. Bowel sounds are normal. She exhibits no distension. There is no abdominal tenderness.  Genitourinary:    Genitourinary Comments: Urinary retention.   Musculoskeletal: Normal range of motion.        General: No edema.  Neurological: She is alert and oriented to person, place, and time.  Skin: Skin is warm. She is not diaphoretic. No erythema.  Psychiatric: She has a normal mood and affect.   LABORATORY PANEL:  Female CBC Recent Labs  Lab 04/19/19 0253  WBC 19.4*  HGB 13.4  HCT 42.4  PLT 311   ------------------------------------------------------------------------------------------------------------------ Chemistries  Recent Labs  Lab 04/19/19 0253  NA 138  K 3.5  CL 104  CO2 26  GLUCOSE 106*  BUN 20  CREATININE 1.24*  CALCIUM 10.1   RADIOLOGY:  Dg Lumbar Spine 2-3 Views  Result Date: 04/19/2019 CLINICAL DATA:  Pain status post fall. EXAM: LUMBAR SPINE - 2-3 VIEW COMPARISON:  None. FINDINGS: The patient has undergone L3 kyphoplasty. Aortic calcifications are noted. There is a probable abdominal aortic aneurysm measuring approximately 4.5 cm. There is endplate sclerosis involving the L4 vertebral body. There is some sclerosis involving the inferior endplate of the L 2 vertebral body which is favored to be degenerative in etiology. There is grade 1 anterolisthesis of L3 on L4, likely degenerative. There are multilevel degenerative changes throughout the lumbar spine including advanced facet arthrosis at the lower lumbar segments.  IMPRESSION: 1. There is mild endplate sclerosis involving the superior endplate of the L4 vertebral body. This is age indeterminate. Correlation with point tenderness and physical exam is recommended. If there is clinical concern for an  acute fracture, follow-up with cross-sectional imaging is recommended. 2. Status post L3 kyphoplasty. 3. Probable abdominal aortic aneurysm measuring 4.5 cm. Follow-up with a nonemergent outpatient ultrasound is recommended for further evaluation. 4. Advanced degenerative changes throughout the lumbar spine. Electronically Signed   By: Constance Holster M.D.   On: 04/19/2019 01:32   Ct L-spine No Charge  Result Date: 04/19/2019 CLINICAL DATA:  Recent fall with fracture and kyphoplasty performed 04/10/2019. Increasing back pain. EXAM: CT LUMBAR SPINE WITHOUT CONTRAST TECHNIQUE: Multidetector CT imaging of the lumbar spine was performed without intravenous contrast administration. Multiplanar CT image reconstructions were also generated. COMPARISON:  Abdominal CT performed today. Plain films 04/10/2019. CT 02/23/2016 FINDINGS: Segmentation: 5 lumbar type vertebral bodies. Alignment: Slight anterolisthesis at L3-4 related to facet disease. Vertebrae: Fracture noted through the L3 vertebral body with fracture lines remain evident. Vertebral augmentation changes noted in the L3 vertebral body. There is superior endplate cortical irregularity at L4, best seen along the anterior superior corner. No significant loss in vertebral body height. Paraspinal and other soft tissues: Negative. Disc levels: Diffuse degenerative disc disease and facet disease. IMPRESSION: L3 vertebral body fracture status post vertebral augmentation. Fracture lines remain evident. Fracture through the superior endplate of L4, best seen along the anterior superior corner. No loss in vertebral body height. Diffuse degenerative disc and facet disease. Electronically Signed   By: Rolm Baptise M.D.   On: 04/19/2019 03:50   Ct Renal Stone Study  Result Date: 04/19/2019 CLINICAL DATA:  Fall last night. L4 fracture. Increasing back pain. EXAM: CT ABDOMEN AND PELVIS WITHOUT CONTRAST TECHNIQUE: Multidetector CT imaging of the abdomen and pelvis was  performed following the standard protocol without IV contrast. COMPARISON:  Plain film 04/19/2019 and 04/10/2019.  CT 02/23/2016. FINDINGS: Lower chest: Mild cardiomegaly. Aortic atherosclerosis. No acute abnormality. Hepatobiliary: No focal hepatic abnormality. Gallbladder unremarkable. Pancreas: No focal abnormality or ductal dilatation. Spleen: Splenomegaly with a craniocaudal length of the spleen measuring 18 cm. This is stable when compared to prior CT from 2017. Adrenals/Urinary Tract: Multiple right renal cysts. 6 mm nonobstructing stone in the lower pole of the right kidney. Renovascular calcifications. Punctate nonobstructing stone in the midpole of the left kidney. No hydronephrosis. Urinary bladder and adrenal glands unremarkable. Stomach/Bowel: Stomach, large and small bowel grossly unremarkable. Vascular/Lymphatic: Aortic atherosclerosis. 3.9 cm abdominal aortic aneurysm. No adenopathy. Reproductive: Prior hysterectomy.  No adnexal masses. Other: No free fluid or free air. Musculoskeletal: Fracture involving the L3 vertebral body with changes of vertebroplasty. Fracture lines remain evident. Probable fracture through the superior endplate of L4 without loss of vertebral body height. Degenerative facet disease throughout the lumbar spine. Slight anterolisthesis at L3-4, stable. IMPRESSION: L3 fracture, status post vertebroplasty. Fracture lines remain evident. Slight irregularity along the superior endplate of L4 may reflect mild fracture without significant loss of vertebral body height. No retropulsed fracture fragments. Bilateral nephrolithiasis.  No hydronephrosis. Splenomegaly, stable since 2017. Diffuse aortoiliac atherosclerosis. 3.9 cm abdominal aortic aneurysm. Recommend followup by ultrasound in 2 years. This recommendation follows ACR consensus guidelines: White Paper of the ACR Incidental Findings Committee II on Vascular Findings. J Am Coll Radiol 2013; 10:789-794. Aortic aneurysm NOS  (ICD10-I71.9) Electronically Signed   By: Rolm Baptise M.D.   On: 04/19/2019 03:47   ASSESSMENT AND PLAN:  This is an 84 year old female admitted for back pain.  1.  Back pain: Intractable pain following kyphoplasty done by Dr. Rudene Christians from 04/10/2019. Pains appear better controlled with current regimen. Patient developed urinary retention.  Later notified that patient voided about 600 cc on standing. Requested for consult for input from Dr. Rudene Christians Follow-up on PT and OT evaluation.  Patient will benefit from skilled nursing facility placement on discharge  2.  Acute urinary retention. Bladder scan revealed 800 cc.  Nursing staff unable to place Foley catheter.  Urology consultation was placed. Later notified by nursing staff that patient voided about 600 cc.  Urologist to see patient because patient may still benefit from Foley catheter to prevent recurrent retention.  3.  Oral thrush: Continue swish and swallow nystatin as well as vitamin C tablets.  4.  Hypertension: Uncontrolled; partly due to pain. Initiated hydralazine p.o.  Continue isosorbide.  Hydralazine as needed.  4.  Hypothyroidism:  TSH level elevated at 9.749.   Increased dose of Synthroid from 112 to 150 mcg p.o. daily.  5.  Asthma: Continue long-acting bronchial agonist as well as albuterol  6.  Depression: Continue Celexa    DVT prophylaxis: Lovenox   All the records are reviewed and case discussed with Care Management/Social Worker. Management plans discussed with the patient, family and they are in agreement.  CODE STATUS: Full Code  TOTAL TIME TAKING CARE OF THIS PATIENT: 38 minutes.   More than 50% of the time was spent in counseling/coordination of care: YES  POSSIBLE D/C IN 2 DAYS, DEPENDING ON CLINICAL CONDITION.   Hendrix Console M.D on 04/19/2019 at 11:41 AM  Between 7am to 6pm - Pager - [phone]  After 6pm go to [email or web address] - Proofreader  [employer]  [facility]  Office   [phone]  CC: Primary care physician; Zhou-Talbert, Elwyn Lade, MD  Note: This dictation was prepared with Dragon dictation along with smaller phrase technology. Any transcriptional errors that result from this process are unintentional.

## 2019-04-19 NOTE — Care Management Obs Status (Signed)
[facility] NOTIFICATION   Patient Details  Name: Veronica Hubbard MRN: [id number] Date of Birth: [birth year]   Medicare Observation Status Notification Given:  Yes    Keiarah Orlowski A Renly Guedes, RN 04/19/2019, 8:48 AM

## 2019-04-19 NOTE — Progress Notes (Signed)
Pt complaining of 10 out of 10 pain gave oxycodone. Pt stated she needed to urinate attempted to use external female catheter but she was unable too. When asked about ambulation she stated she could not. Paged provider advised and wanted to have foley placed. Two attempts by two different nurses was made to place foley but patient has swelling at urethra. Pt finally agreeable to stand pivot to bedside commode was was able to void. She has a brace that she is unable to tolerate wearing because she complains of pain. She also complains of pain in her mouth.

## 2019-04-19 NOTE — ED Notes (Signed)
ED TO INPATIENT HANDOFF REPORT  ED Nurse Name and Phone #: Madissen Wyse [phone]   S Name/Age/Gender Veronica Hubbard 84 y.o. female Room/Bed: ED09A/ED09A  Code Status   Code Status: Prior  Home/SNF/Other Home Patient oriented to: self, place, time and situation Is this baseline? Yes   Triage Complete: Triage complete  Chief Complaint [facility] Fall L3 Fx  Triage Note Patient brought in by ems from home. Patient fell last and had a L4 fracture. Patient states that she had surgery for the fracture. Patient states that she came in tonight fro increase back pain.    Allergies Allergies  Allergen Reactions  . Aspirin Shortness Of Breath  . Celecoxib Shortness Of Breath  . Iodinated Diagnostic Agents Other (See Comments)    Reaction:  Decreased kidney function  . Losartan Itching  . Morphine Nausea And Vomiting  . Verapamil Itching    Level of Care/Admitting Diagnosis ED Disposition    ED Disposition Condition [facility] Area: [place] [100120]  Level of Care: Med-Surg [16]  Covid Evaluation: Confirmed COVID Negative  Diagnosis: Intractable pain [914782]  Admitting Physician: Harrie Foreman [[id number]]  Attending Physician: Harrie Foreman [phone]  PT Class (Do Not Modify): Observation [104]  PT Acc Code (Do Not Modify): Observation [10022]       B Medical/Surgery History Past Medical History:  Diagnosis Date  . Asthma   . Cancer ([place]) 02/2018   bone marrow cancer  . Depression   . GERD (gastroesophageal reflux disease)   . Hypertension   . Hypothyroidism   . Personal history of chemotherapy   . Spinal stenosis    Past Surgical History:  Procedure Laterality Date  . ABDOMINAL HYSTERECTOMY    . BACK SURGERY    . BREAST BIOPSY Left 2003   neg  . cataracts    . KYPHOPLASTY N/A 04/10/2019   Procedure: KYPHOPLASTY L3;  Surgeon: Hessie Knows, MD;  Location: [facility] ORS;  Service: Orthopedics;  Laterality: N/A;   . lipoma removal    . SHOULDER SURGERY       A IV Location/Drains/Wounds Patient Lines/Drains/Airways Status   Active Line/Drains/Airways    Name:   Placement date:   Placement time:   Site:   Days:   Peripheral IV 04/19/19 Left Antecubital   04/19/19    0255    Antecubital   less than 1   Incision (Closed) 04/10/19 Back   04/10/19    1944     9          Intake/Output Last 24 hours No intake or output data in the 24 hours ending 04/19/19 0446  Labs/Imaging Results for orders placed or performed during the hospital encounter of 04/18/19 (from the past 48 hour(s))  Basic metabolic panel     Status: Abnormal   Collection Time: 04/19/19  2:53 AM  Result Value Ref Range   Sodium 138 135 - 145 mmol/L   Potassium 3.5 3.5 - 5.1 mmol/L   Chloride 104 98 - 111 mmol/L   CO2 26 22 - 32 mmol/L   Glucose, Bld 106 (H) 70 - 99 mg/dL   BUN 20 8 - 23 mg/dL   Creatinine, Ser 1.24 (H) 0.44 - 1.00 mg/dL   Calcium 10.1 8.9 - 10.3 mg/dL   GFR calc non Af Amer 40 (L) >60 mL/min   GFR calc Af Amer 47 (L) >60 mL/min   Anion gap 8 5 - 15    Comment:  Performed at [facility], [address] [place], [place] [address]  CBC with Differential/Platelet     Status: Abnormal   Collection Time: 04/19/19  2:53 AM  Result Value Ref Range   WBC 19.4 (H) 4.0 - 10.5 K/uL   RBC 4.91 3.87 - 5.11 MIL/uL   Hemoglobin 13.4 12.0 - 15.0 g/dL   HCT 42.4 36.0 - 46.0 %   MCV 86.4 80.0 - 100.0 fL   MCH 27.3 26.0 - 34.0 pg   MCHC 31.6 30.0 - 36.0 g/dL   RDW 20.7 (H) 11.5 - 15.5 %   Platelets 311 150 - 400 K/uL   nRBC 0.3 (H) 0.0 - 0.2 %   Neutrophils Relative % 75 %   Neutro Abs 15.1 (H) 1.7 - 7.7 K/uL   Band Neutrophils 3 %   Lymphocytes Relative 11 %   Lymphs Abs 2.1 0.7 - 4.0 K/uL   Monocytes Relative 3 %   Monocytes Absolute 0.6 0.1 - 1.0 K/uL   Eosinophils Relative 2 %   Eosinophils Absolute 0.4 0.0 - 0.5 K/uL   Basophils Relative 0 %   Basophils Absolute 0.0 0.0 - 0.1 K/uL   WBC  Morphology MILD LEFT SHIFT (1-5% METAS, OCC MYELO, OCC BANDS)    RBC Morphology MORPHOLOGY UNREMARKABLE    Smear Review PLATELET CLUMPS NOTED ON SMEAR, UNABLE TO ESTIMATE    Myelocytes 4 %   Promyelocytes Relative 2 %   Abs Immature Granulocytes 1.20 (H) 0.00 - 0.07 K/uL   Smudge Cells PRESENT     Comment: Performed at [facility], [address], [place], [place] [address]   Dg Lumbar Spine 2-3 Views  Result Date: 04/19/2019 CLINICAL DATA:  Pain status post fall. EXAM: LUMBAR SPINE - 2-3 VIEW COMPARISON:  None. FINDINGS: The patient has undergone L3 kyphoplasty. Aortic calcifications are noted. There is a probable abdominal aortic aneurysm measuring approximately 4.5 cm. There is endplate sclerosis involving the L4 vertebral body. There is some sclerosis involving the inferior endplate of the L 2 vertebral body which is favored to be degenerative in etiology. There is grade 1 anterolisthesis of L3 on L4, likely degenerative. There are multilevel degenerative changes throughout the lumbar spine including advanced facet arthrosis at the lower lumbar segments. IMPRESSION: 1. There is mild endplate sclerosis involving the superior endplate of the L4 vertebral body. This is age indeterminate. Correlation with point tenderness and physical exam is recommended. If there is clinical concern for an acute fracture, follow-up with cross-sectional imaging is recommended. 2. Status post L3 kyphoplasty. 3. Probable abdominal aortic aneurysm measuring 4.5 cm. Follow-up with a nonemergent outpatient ultrasound is recommended for further evaluation. 4. Advanced degenerative changes throughout the lumbar spine. Electronically Signed   By: Constance Holster M.D.   On: 04/19/2019 01:32   Ct L-spine No Charge  Result Date: 04/19/2019 CLINICAL DATA:  Recent fall with fracture and kyphoplasty performed 04/10/2019. Increasing back pain. EXAM: CT LUMBAR SPINE WITHOUT CONTRAST TECHNIQUE: Multidetector CT imaging  of the lumbar spine was performed without intravenous contrast administration. Multiplanar CT image reconstructions were also generated. COMPARISON:  Abdominal CT performed today. Plain films 04/10/2019. CT 02/23/2016 FINDINGS: Segmentation: 5 lumbar type vertebral bodies. Alignment: Slight anterolisthesis at L3-4 related to facet disease. Vertebrae: Fracture noted through the L3 vertebral body with fracture lines remain evident. Vertebral augmentation changes noted in the L3 vertebral body. There is superior endplate cortical irregularity at L4, best seen along the anterior superior corner. No significant loss in vertebral  body height. Paraspinal and other soft tissues: Negative. Disc levels: Diffuse degenerative disc disease and facet disease. IMPRESSION: L3 vertebral body fracture status post vertebral augmentation. Fracture lines remain evident. Fracture through the superior endplate of L4, best seen along the anterior superior corner. No loss in vertebral body height. Diffuse degenerative disc and facet disease. Electronically Signed   By: Rolm Baptise M.D.   On: 04/19/2019 03:50   Ct Renal Stone Study  Result Date: 04/19/2019 CLINICAL DATA:  Fall last night. L4 fracture. Increasing back pain. EXAM: CT ABDOMEN AND PELVIS WITHOUT CONTRAST TECHNIQUE: Multidetector CT imaging of the abdomen and pelvis was performed following the standard protocol without IV contrast. COMPARISON:  Plain film 04/19/2019 and 04/10/2019.  CT 02/23/2016. FINDINGS: Lower chest: Mild cardiomegaly. Aortic atherosclerosis. No acute abnormality. Hepatobiliary: No focal hepatic abnormality. Gallbladder unremarkable. Pancreas: No focal abnormality or ductal dilatation. Spleen: Splenomegaly with a craniocaudal length of the spleen measuring 18 cm. This is stable when compared to prior CT from 2017. Adrenals/Urinary Tract: Multiple right renal cysts. 6 mm nonobstructing stone in the lower pole of the right kidney. Renovascular  calcifications. Punctate nonobstructing stone in the midpole of the left kidney. No hydronephrosis. Urinary bladder and adrenal glands unremarkable. Stomach/Bowel: Stomach, large and small bowel grossly unremarkable. Vascular/Lymphatic: Aortic atherosclerosis. 3.9 cm abdominal aortic aneurysm. No adenopathy. Reproductive: Prior hysterectomy.  No adnexal masses. Other: No free fluid or free air. Musculoskeletal: Fracture involving the L3 vertebral body with changes of vertebroplasty. Fracture lines remain evident. Probable fracture through the superior endplate of L4 without loss of vertebral body height. Degenerative facet disease throughout the lumbar spine. Slight anterolisthesis at L3-4, stable. IMPRESSION: L3 fracture, status post vertebroplasty. Fracture lines remain evident. Slight irregularity along the superior endplate of L4 may reflect mild fracture without significant loss of vertebral body height. No retropulsed fracture fragments. Bilateral nephrolithiasis.  No hydronephrosis. Splenomegaly, stable since 2017. Diffuse aortoiliac atherosclerosis. 3.9 cm abdominal aortic aneurysm. Recommend followup by ultrasound in 2 years. This recommendation follows ACR consensus guidelines: White Paper of the ACR Incidental Findings Committee II on Vascular Findings. J Am Coll Radiol 2013; 10:789-794. Aortic aneurysm NOS (ICD10-I71.9) Electronically Signed   By: Rolm Baptise M.D.   On: 04/19/2019 03:47    Pending Labs Unresulted Labs (From admission, onward)    Start     Ordered   04/19/19 0430  Novel Coronavirus,NAA,(SEND-OUT TO REF LAB - TAT 24-48 hrs); Hosp Order  (Asymptomatic Patients Labs)  Once,   STAT    Question:  Rule Out  Answer:  Yes   04/19/19 0429   04/19/19 0253  Pathologist smear review  Once,   STAT     04/19/19 0253   04/19/19 0225  Urinalysis, Routine w reflex microscopic  ONCE - STAT,   STAT     04/19/19 0226   04/19/19 0225  Urine Culture  Add-on,   AD    Question:  Patient immune  status  Answer:  Normal   04/19/19 0226   Signed and Held  Creatinine, serum  (enoxaparin (LOVENOX)    CrCl >/= 30 ml/min)  Weekly,   R    Comments: while on enoxaparin therapy    Signed and Held   Signed and Held  TSH  Add-on,   R     Signed and Held          Vitals/Pain Today's Vitals   04/18/19 2356 04/19/19 0300 04/19/19 0330 04/19/19 0408  BP: (!) 188/66 (!) 182/71 (!) 186/75  Pulse: 62 63 71   Resp: 18     Temp:      TempSrc:      SpO2: 97% 95% 94%   Weight:      Height:      PainSc: 9    Asleep    Isolation Precautions No active isolations  Medications Medications  morphine 2 MG/ML injection 2 mg (2 mg Intravenous Given 04/19/19 0300)  ondansetron (ZOFRAN) injection 4 mg (4 mg Intravenous Given 04/19/19 0258)    Mobility non-ambulatory Moderate fall risk   Focused Assessments musculoskeletal   R Recommendations: See Admitting Provider Note  Report given to:   Additional Notes:

## 2019-04-19 NOTE — ED Notes (Signed)
Po fluids provided.  

## 2019-04-19 NOTE — ED Notes (Signed)
Pt's sister updated via telephone on progression of care.

## 2019-04-19 NOTE — Evaluation (Signed)
Physical Therapy Evaluation Patient Details Name: Veronica Hubbard MRN: [id number] DOB: [birth year] Today's Date: 04/19/2019   History of Present Illness  84 y/o female arrives with intractible back pain, past medical history of hypertension, hypothyroidism as well as "bone marrow cancer" presents emergency department via EMS due to severe back pain.  The patient had kyphoplasty of L3 04/10/19.    Clinical Impression  Pt severely pain limited t/o PT exam and was able to do very little because of this. She showed good effort and was willing to try working with PT, but did not ultimately even tolerate getting to sitting. (She was able to get up to sitting with nursing earlier today with less pain).  Pt reports that she was able to ambulate well over the last week since sx, but pain has been almost completely limiting since yesterday.  Per today's performance pt is not able/safe to go home, further mobility assessment needed as pt is able to tolerate.  Pt to have MRI later today.    Follow Up Recommendations SNF    Equipment Recommendations  None recommended by PT    Recommendations for Other Services       Precautions / Restrictions Precautions Precautions: None Required Braces or Orthoses: Spinal Brace Spinal Brace: Lumbar corset Restrictions Weight Bearing Restrictions: No      Mobility  Bed Mobility Overal bed mobility: Needs Assistance Bed Mobility: Rolling;Sidelying to Sit Rolling: Min assist(Pt able to roll relatively well) Sidelying to sit: Max assist       General bed mobility comments: Pt was unable to tolerate getting to sitting this afternoon secondary to c/o severe pain.  PT educated and maintained neutral spine during attempted transition and called out in severe pain t/o the effort  Transfers                 General transfer comment: unable secondary to pain  Ambulation/Gait                Stairs            Wheelchair Mobility     Modified Rankin (Stroke Patients Only)       Balance Overall balance assessment: (unable to even attain sitting secondary to pain)                                           Pertinent Vitals/Pain Pain Assessment: 0-10 Pain Score: 9  Pain Location: low back, near kypho site    Home Living Family/patient expects to be discharged to:: Unsure Living Arrangements: Alone Available Help at Discharge: Family(sister has been staying with her recently, runs errands, etc)   Home Access: Ramped entrance(over 1 small step)       Home Equipment: Walker - 2 wheels;Cane - single point;Bedside commode;Shower seat;Grab bars - toilet;Hand held shower head;Transport chair      Prior Function Level of Independence: Independent with assistive device(s)         Comments: Pt reports that she had been walking relatively well with walker since surgery until yesterday when back pain was severely limiting     Hand Dominance        Extremity/Trunk Assessment   Upper Extremity Assessment Upper Extremity Assessment: Generalized weakness(pain limited)    Lower Extremity Assessment Lower Extremity Assessment: Generalized weakness(difficult to assess secondary to severe LBP)       Communication   Communication:  No difficulties  Cognition Arousal/Alertness: Awake/alert Behavior During Therapy: Restless Overall Cognitive Status: Within Functional Limits for tasks assessed                                        General Comments      Exercises     Assessment/Plan    PT Assessment Patient needs continued PT services  PT Problem List Decreased strength;Decreased range of motion;Decreased activity tolerance;Decreased balance;Decreased mobility;Decreased knowledge of use of DME;Decreased safety awareness;Pain;Decreased knowledge of precautions       PT Treatment Interventions Gait training;DME instruction;Functional mobility training;Therapeutic  activities;Therapeutic exercise;Balance training;Neuromuscular re-education;Patient/family education    PT Goals (Current goals can be found in the Care Plan section)  Acute Rehab PT Goals Patient Stated Goal: control pain PT Goal Formulation: With patient Time For Goal Achievement: 05/03/19    Frequency Min 2X/week   Barriers to discharge        Co-evaluation               AM-PAC PT "6 Clicks" Mobility  Outcome Measure Help needed turning from your back to your side while in a flat bed without using bedrails?: A Lot Help needed moving from lying on your back to sitting on the side of a flat bed without using bedrails?: Total Help needed moving to and from a bed to a chair (including a wheelchair)?: Total Help needed standing up from a chair using your arms (e.g., wheelchair or bedside chair)?: Total Help needed to walk in hospital room?: Total Help needed climbing 3-5 steps with a railing? : Total 6 Click Score: 7    End of Session Equipment Utilized During Treatment: Back brace Activity Tolerance: Patient limited by pain Patient left: with bed alarm set;with call bell/phone within reach Nurse Communication: Mobility status;Patient requests pain meds PT Visit Diagnosis: Muscle weakness (generalized) (M62.81);Difficulty in walking, not elsewhere classified (R26.2);Pain Pain - part of body: (lumbago)    Time: 0947-0962 PT Time Calculation (min) (ACUTE ONLY): 14 min   Charges:   PT Evaluation $PT Eval Low Complexity: 1 Low          Kreg Shropshire, DPT 04/19/2019, 3:56 PM

## 2019-04-19 NOTE — Plan of Care (Signed)

## 2019-04-19 NOTE — ED Notes (Signed)
Report from noel, rn.  

## 2019-04-19 NOTE — ED Notes (Signed)
Pt returned from CT att.

## 2019-04-19 NOTE — Consult Note (Signed)
Urology Consult  Referring physician: Dr. Terrilee Files Reason for referral: Urinary retention  Chief Complaint: abdominal pain/feeling of incomplete emptying  History of Present Illness: [name] is a 84yo with a history of spinal stenosis, compression fracture who was admitted with severe back pain and abdominal pain. She was underwent L3 Kyphoplasty recently. This morning she was found to have a PVR of 800cc and difficulty urinating. She was able to void 600cc. Prior to hospitalization she was not having any urinary issues. She denies currently dysuria, hematuria. No other associated symptoms. No exacerbating/alleviaitng events. UA concerning for UTI. On MRI today she appears to have a new L4 compression fracture.   Past Medical History:  Diagnosis Date  . Asthma   . Cancer ([place]) 02/2018   bone marrow cancer  . Depression   . GERD (gastroesophageal reflux disease)   . Hypertension   . Hypothyroidism   . Personal history of chemotherapy   . Spinal stenosis    Past Surgical History:  Procedure Laterality Date  . ABDOMINAL HYSTERECTOMY    . BACK SURGERY    . BREAST BIOPSY Left 2003   neg  . cataracts    . KYPHOPLASTY N/A 04/10/2019   Procedure: KYPHOPLASTY L3;  Surgeon: Hessie Knows, MD;  Location: [facility] ORS;  Service: Orthopedics;  Laterality: N/A;  . lipoma removal    . SHOULDER SURGERY      Medications: I have reviewed the patient's current medications. Allergies:  Allergies  Allergen Reactions  . Aspirin Shortness Of Breath  . Celecoxib Shortness Of Breath  . Iodinated Diagnostic Agents Other (See Comments)    Reaction:  Decreased kidney function  . Losartan Itching  . Morphine Nausea And Vomiting  . Verapamil Itching    Family History  Problem Relation Age of Onset  . Leukemia Mother   . CAD Father   . Diabetes Father   . Breast cancer Sister 81   Social History:  reports that she has quit smoking. Her smoking use included cigarettes. She has never used smokeless  tobacco. She reports that she does not drink alcohol or use drugs.  Review of Systems  Gastrointestinal: Positive for abdominal pain.  Musculoskeletal: Positive for back pain.  All other systems reviewed and are negative.   Physical Exam:  Vital signs in last 24 hours: Temp:  [98.2 F (36.8 C)-98.4 F (36.9 C)] 98.4 F (36.9 C) (06/13 1508) Pulse Rate:  [62-95] 95 (06/13 1508) Resp:  [18-22] 19 (06/13 1508) BP: (139-188)/(63-146) 139/83 (06/13 1508) SpO2:  [94 %-99 %] 99 % (06/13 1508) Weight:  [64.9 kg-65.7 kg] 65.7 kg (06/13 0618) Physical Exam  Constitutional: She is oriented to person, place, and time. She appears well-developed and well-nourished.  HENT:  Head: Normocephalic and atraumatic.  Eyes: Pupils are equal, round, and reactive to light. EOM are normal.  Neck: Normal range of motion. Neck supple. No thyromegaly present.  Cardiovascular: Normal rate and regular rhythm.  Respiratory: Effort normal. No respiratory distress.  GI: Soft. She exhibits no distension. There is no abdominal tenderness.  Musculoskeletal: Normal range of motion.        General: No edema.  Neurological: She is alert and oriented to person, place, and time.  Skin: Skin is warm and dry.  Psychiatric: She has a normal mood and affect. Her behavior is normal. Judgment and thought content normal.    Laboratory Data:  Results for orders placed or performed during the hospital encounter of 04/18/19 (from the past 72 hour(s))  Urinalysis,  Routine w reflex microscopic     Status: Abnormal   Collection Time: 04/19/19  2:25 AM  Result Value Ref Range   Color, Urine YELLOW (A) YELLOW   APPearance CLEAR (A) CLEAR   Specific Gravity, Urine 1.009 1.005 - 1.030   pH 6.0 5.0 - 8.0   Glucose, UA NEGATIVE NEGATIVE mg/dL   Hgb urine dipstick MODERATE (A) NEGATIVE   Bilirubin Urine NEGATIVE NEGATIVE   Ketones, ur NEGATIVE NEGATIVE mg/dL   Protein, ur NEGATIVE NEGATIVE mg/dL   Nitrite NEGATIVE NEGATIVE    Leukocytes,Ua SMALL (A) NEGATIVE   RBC / HPF 11-20 0 - 5 RBC/hpf   WBC, UA 11-20 0 - 5 WBC/hpf   Bacteria, UA RARE (A) NONE SEEN   Squamous Epithelial / LPF NONE SEEN 0 - 5   Mucus PRESENT     Comment: Performed at [facility], [address] [place], [place] [address]  Basic metabolic panel     Status: Abnormal   Collection Time: 04/19/19  2:53 AM  Result Value Ref Range   Sodium 138 135 - 145 mmol/L   Potassium 3.5 3.5 - 5.1 mmol/L   Chloride 104 98 - 111 mmol/L   CO2 26 22 - 32 mmol/L   Glucose, Bld 106 (H) 70 - 99 mg/dL   BUN 20 8 - 23 mg/dL   Creatinine, Ser 1.24 (H) 0.44 - 1.00 mg/dL   Calcium 10.1 8.9 - 10.3 mg/dL   GFR calc non Af Amer 40 (L) >60 mL/min   GFR calc Af Amer 47 (L) >60 mL/min   Anion gap 8 5 - 15    Comment: Performed at [facility], [address] [place], [place] [address]  CBC with Differential/Platelet     Status: Abnormal   Collection Time: 04/19/19  2:53 AM  Result Value Ref Range   WBC 19.4 (H) 4.0 - 10.5 K/uL   RBC 4.91 3.87 - 5.11 MIL/uL   Hemoglobin 13.4 12.0 - 15.0 g/dL   HCT 42.4 36.0 - 46.0 %   MCV 86.4 80.0 - 100.0 fL   MCH 27.3 26.0 - 34.0 pg   MCHC 31.6 30.0 - 36.0 g/dL   RDW 20.7 (H) 11.5 - 15.5 %   Platelets 311 150 - 400 K/uL   nRBC 0.3 (H) 0.0 - 0.2 %   Neutrophils Relative % 75 %   Neutro Abs 15.1 (H) 1.7 - 7.7 K/uL   Band Neutrophils 3 %   Lymphocytes Relative 11 %   Lymphs Abs 2.1 0.7 - 4.0 K/uL   Monocytes Relative 3 %   Monocytes Absolute 0.6 0.1 - 1.0 K/uL   Eosinophils Relative 2 %   Eosinophils Absolute 0.4 0.0 - 0.5 K/uL   Basophils Relative 0 %   Basophils Absolute 0.0 0.0 - 0.1 K/uL   WBC Morphology MILD LEFT SHIFT (1-5% METAS, OCC MYELO, OCC BANDS)    RBC Morphology MORPHOLOGY UNREMARKABLE    Smear Review PLATELET CLUMPS NOTED ON SMEAR, UNABLE TO ESTIMATE    Myelocytes 4 %   Promyelocytes Relative 2 %   Abs Immature Granulocytes 1.20 (H) 0.00 - 0.07 K/uL   Smudge Cells PRESENT      Comment: Performed at [facility], [address] [place], [place] [address]  TSH     Status: Abnormal   Collection Time: 04/19/19  2:53 AM  Result Value Ref Range   TSH 9.749 (H) 0.350 - 4.500 uIU/mL    Comment: Performed by a 3rd  Generation assay with a functional sensitivity of <=0.01 uIU/mL. Performed at [facility], [address] [place], [place] [address]    No results found for this or any previous visit (from the past 240 hour(s)). Creatinine: Recent Labs    04/19/19 0253  CREATININE 1.24*   Baseline Creatinine: 1  Impression/Assessment:  84yo with incomplete bladder emptying and UTI  Plan:  1. UTI: Please start broad spectrum antibiotics pending urine culture 2. Incomplete bladder emptying: I discussed the various causes of incomplete emptying with the patient and the various treatment options. He incomplete emptying is likely a combination of severe pain, narcotic use, UTI and level of compression fracture. Since she is able to empty her bladder by >60% I would recommend observation and PVRs after the patient voids. If the patient develops PVRs over 500cc please place a foley catheter.    Nicolette Bang 04/19/2019, 9:34 PM

## 2019-04-19 NOTE — ED Notes (Signed)
Patient transported to CT 

## 2019-04-19 NOTE — H&P (Signed)
Veronica Hubbard is an 84 y.o. female.   Chief Complaint: Back pain HPI: The patient with past medical history of hypertension, hypothyroidism as well as "bone marrow cancer" presents emergency department via EMS due to severe back pain.  The patient had kyphoplasty of L3 a week ago.  She reports that she is now unable to move due to pain.  She has been trying to eat and drink but admits to not having enough oral intake.  She also reports mouth pain particularly with swallowing.  She denies fever or respiratory symptoms.  No travel risk factors or known contact with individuals with exposure to novel coronavirus. The patient received multiple doses of analgesia in the emergency department without relief which prompted the emergency department staff to call the hospitalist service for admission.  Past Medical History:  Diagnosis Date  . Asthma   . Cancer ([place]) 02/2018   bone marrow cancer  . Depression   . GERD (gastroesophageal reflux disease)   . Hypertension   . Hypothyroidism   . Personal history of chemotherapy   . Spinal stenosis     Past Surgical History:  Procedure Laterality Date  . ABDOMINAL HYSTERECTOMY    . BACK SURGERY    . BREAST BIOPSY Left 2003   neg  . cataracts    . KYPHOPLASTY N/A 04/10/2019   Procedure: KYPHOPLASTY L3;  Surgeon: Hessie Knows, MD;  Location: [facility] ORS;  Service: Orthopedics;  Laterality: N/A;  . lipoma removal    . SHOULDER SURGERY      Family History  Problem Relation Age of Onset  . Leukemia Mother   . CAD Father   . Diabetes Father   . Breast cancer Sister 47   Social History:  reports that she has quit smoking. Her smoking use included cigarettes. She has never used smokeless tobacco. She reports that she does not drink alcohol or use drugs.  Allergies:  Allergies  Allergen Reactions  . Aspirin Shortness Of Breath  . Celecoxib Shortness Of Breath  . Iodinated Diagnostic Agents Other (See Comments)    Reaction:  Decreased kidney  function  . Losartan Itching  . Morphine Nausea And Vomiting  . Verapamil Itching    Medications Prior to Admission  Medication Sig Dispense Refill  . acetaminophen (TYLENOL) 500 MG tablet Take 1,000 mg by mouth every 6 (six) hours as needed for mild pain or headache.    . albuterol (PROVENTIL HFA;VENTOLIN HFA) 108 (90 Base) MCG/ACT inhaler Inhale 2 puffs into the lungs every 4 (four) hours as needed for wheezing or shortness of breath.    . allopurinol (ZYLOPRIM) 100 MG tablet Take 200 mg by mouth daily.    Marland Kitchen azithromycin (ZITHROMAX) 250 MG tablet Take 250 mg by mouth every Monday, Wednesday, and Friday.    . Cholecalciferol (VITAMIN D3) 25 MCG (1000 UT) CAPS Take 1,000 Units by mouth daily.    . citalopram (CELEXA) 20 MG tablet Take 20 mg by mouth daily.    . cyclobenzaprine (FLEXERIL) 5 MG tablet Take 5-10 mg by mouth at bedtime as needed for muscle spasms.    Marland Kitchen ezetimibe (ZETIA) 10 MG tablet Take 10 mg by mouth daily.    . Fluticasone-Umeclidin-Vilant 100-62.5-25 MCG/INH AEPB Inhale 1 puff into the lungs daily.    . isosorbide mononitrate (IMDUR) 30 MG 24 hr tablet Take 30 mg by mouth daily.    Marland Kitchen levothyroxine (SYNTHROID) 112 MCG tablet Take 112 mcg by mouth daily before breakfast.     .  omega-3 acid ethyl esters (LOVAZA) 1 g capsule Take 1 g by mouth at bedtime.     Marland Kitchen oxyCODONE HCl 7.5 MG TABA Take 7.5 mg by mouth every 6 (six) hours as needed (pain).    . pantoprazole (PROTONIX) 40 MG tablet Take 40 mg by mouth 2 (two) times daily.    Marland Kitchen triamcinolone cream (KENALOG) 0.5 % Apply 1 application topically 2 (two) times daily as needed (for itching/rash).      Results for orders placed or performed during the hospital encounter of 04/18/19 (from the past 48 hour(s))  Basic metabolic panel     Status: Abnormal   Collection Time: 04/19/19  2:53 AM  Result Value Ref Range   Sodium 138 135 - 145 mmol/L   Potassium 3.5 3.5 - 5.1 mmol/L   Chloride 104 98 - 111 mmol/L   CO2 26 22 - 32  mmol/L   Glucose, Bld 106 (H) 70 - 99 mg/dL   BUN 20 8 - 23 mg/dL   Creatinine, Ser 1.24 (H) 0.44 - 1.00 mg/dL   Calcium 10.1 8.9 - 10.3 mg/dL   GFR calc non Af Amer 40 (L) >60 mL/min   GFR calc Af Amer 47 (L) >60 mL/min   Anion gap 8 5 - 15    Comment: Performed at [facility], [address] [place], [place] [address]  CBC with Differential/Platelet     Status: Abnormal   Collection Time: 04/19/19  2:53 AM  Result Value Ref Range   WBC 19.4 (H) 4.0 - 10.5 K/uL   RBC 4.91 3.87 - 5.11 MIL/uL   Hemoglobin 13.4 12.0 - 15.0 g/dL   HCT 42.4 36.0 - 46.0 %   MCV 86.4 80.0 - 100.0 fL   MCH 27.3 26.0 - 34.0 pg   MCHC 31.6 30.0 - 36.0 g/dL   RDW 20.7 (H) 11.5 - 15.5 %   Platelets 311 150 - 400 K/uL   nRBC 0.3 (H) 0.0 - 0.2 %   Neutrophils Relative % 75 %   Neutro Abs 15.1 (H) 1.7 - 7.7 K/uL   Band Neutrophils 3 %   Lymphocytes Relative 11 %   Lymphs Abs 2.1 0.7 - 4.0 K/uL   Monocytes Relative 3 %   Monocytes Absolute 0.6 0.1 - 1.0 K/uL   Eosinophils Relative 2 %   Eosinophils Absolute 0.4 0.0 - 0.5 K/uL   Basophils Relative 0 %   Basophils Absolute 0.0 0.0 - 0.1 K/uL   WBC Morphology MILD LEFT SHIFT (1-5% METAS, OCC MYELO, OCC BANDS)    RBC Morphology MORPHOLOGY UNREMARKABLE    Smear Review PLATELET CLUMPS NOTED ON SMEAR, UNABLE TO ESTIMATE    Myelocytes 4 %   Promyelocytes Relative 2 %   Abs Immature Granulocytes 1.20 (H) 0.00 - 0.07 K/uL   Smudge Cells PRESENT     Comment: Performed at [facility], [address], [place], [place] [address]   Dg Lumbar Spine 2-3 Views  Result Date: 04/19/2019 CLINICAL DATA:  Pain status post fall. EXAM: LUMBAR SPINE - 2-3 VIEW COMPARISON:  None. FINDINGS: The patient has undergone L3 kyphoplasty. Aortic calcifications are noted. There is a probable abdominal aortic aneurysm measuring approximately 4.5 cm. There is endplate sclerosis involving the L4 vertebral body. There is some sclerosis involving the inferior endplate of  the L 2 vertebral body which is favored to be degenerative in etiology. There is grade 1 anterolisthesis of L3 on L4, likely degenerative. There are multilevel degenerative changes throughout  the lumbar spine including advanced facet arthrosis at the lower lumbar segments. IMPRESSION: 1. There is mild endplate sclerosis involving the superior endplate of the L4 vertebral body. This is age indeterminate. Correlation with point tenderness and physical exam is recommended. If there is clinical concern for an acute fracture, follow-up with cross-sectional imaging is recommended. 2. Status post L3 kyphoplasty. 3. Probable abdominal aortic aneurysm measuring 4.5 cm. Follow-up with a nonemergent outpatient ultrasound is recommended for further evaluation. 4. Advanced degenerative changes throughout the lumbar spine. Electronically Signed   By: Constance Holster M.D.   On: 04/19/2019 01:32   Ct L-spine No Charge  Result Date: 04/19/2019 CLINICAL DATA:  Recent fall with fracture and kyphoplasty performed 04/10/2019. Increasing back pain. EXAM: CT LUMBAR SPINE WITHOUT CONTRAST TECHNIQUE: Multidetector CT imaging of the lumbar spine was performed without intravenous contrast administration. Multiplanar CT image reconstructions were also generated. COMPARISON:  Abdominal CT performed today. Plain films 04/10/2019. CT 02/23/2016 FINDINGS: Segmentation: 5 lumbar type vertebral bodies. Alignment: Slight anterolisthesis at L3-4 related to facet disease. Vertebrae: Fracture noted through the L3 vertebral body with fracture lines remain evident. Vertebral augmentation changes noted in the L3 vertebral body. There is superior endplate cortical irregularity at L4, best seen along the anterior superior corner. No significant loss in vertebral body height. Paraspinal and other soft tissues: Negative. Disc levels: Diffuse degenerative disc disease and facet disease. IMPRESSION: L3 vertebral body fracture status post vertebral  augmentation. Fracture lines remain evident. Fracture through the superior endplate of L4, best seen along the anterior superior corner. No loss in vertebral body height. Diffuse degenerative disc and facet disease. Electronically Signed   By: Rolm Baptise M.D.   On: 04/19/2019 03:50   Ct Renal Stone Study  Result Date: 04/19/2019 CLINICAL DATA:  Fall last night. L4 fracture. Increasing back pain. EXAM: CT ABDOMEN AND PELVIS WITHOUT CONTRAST TECHNIQUE: Multidetector CT imaging of the abdomen and pelvis was performed following the standard protocol without IV contrast. COMPARISON:  Plain film 04/19/2019 and 04/10/2019.  CT 02/23/2016. FINDINGS: Lower chest: Mild cardiomegaly. Aortic atherosclerosis. No acute abnormality. Hepatobiliary: No focal hepatic abnormality. Gallbladder unremarkable. Pancreas: No focal abnormality or ductal dilatation. Spleen: Splenomegaly with a craniocaudal length of the spleen measuring 18 cm. This is stable when compared to prior CT from 2017. Adrenals/Urinary Tract: Multiple right renal cysts. 6 mm nonobstructing stone in the lower pole of the right kidney. Renovascular calcifications. Punctate nonobstructing stone in the midpole of the left kidney. No hydronephrosis. Urinary bladder and adrenal glands unremarkable. Stomach/Bowel: Stomach, large and small bowel grossly unremarkable. Vascular/Lymphatic: Aortic atherosclerosis. 3.9 cm abdominal aortic aneurysm. No adenopathy. Reproductive: Prior hysterectomy.  No adnexal masses. Other: No free fluid or free air. Musculoskeletal: Fracture involving the L3 vertebral body with changes of vertebroplasty. Fracture lines remain evident. Probable fracture through the superior endplate of L4 without loss of vertebral body height. Degenerative facet disease throughout the lumbar spine. Slight anterolisthesis at L3-4, stable. IMPRESSION: L3 fracture, status post vertebroplasty. Fracture lines remain evident. Slight irregularity along the superior  endplate of L4 may reflect mild fracture without significant loss of vertebral body height. No retropulsed fracture fragments. Bilateral nephrolithiasis.  No hydronephrosis. Splenomegaly, stable since 2017. Diffuse aortoiliac atherosclerosis. 3.9 cm abdominal aortic aneurysm. Recommend followup by ultrasound in 2 years. This recommendation follows ACR consensus guidelines: White Paper of the ACR Incidental Findings Committee II on Vascular Findings. J Am Coll Radiol 2013; 10:789-794. Aortic aneurysm NOS (ICD10-I71.9) Electronically Signed   By: Rolm Baptise  M.D.   On: 04/19/2019 03:47    Review of Systems  Constitutional: Negative for chills and fever.  HENT: Positive for sore throat. Negative for tinnitus.   Eyes: Negative for blurred vision and redness.  Respiratory: Negative for cough and shortness of breath.   Cardiovascular: Negative for chest pain, palpitations, orthopnea and PND.  Gastrointestinal: Negative for abdominal pain, diarrhea, nausea and vomiting.  Genitourinary: Negative for dysuria, frequency and urgency.  Musculoskeletal: Positive for back pain. Negative for joint pain and myalgias.  Skin: Negative for rash.       No lesions  Neurological: Negative for speech change, focal weakness and weakness.  Endo/Heme/Allergies: Does not bruise/bleed easily.       No temperature intolerance  Psychiatric/Behavioral: Negative for depression and suicidal ideas.    Blood pressure (!) 157/63, pulse 68, temperature 98.2 F (36.8 C), temperature source Oral, resp. rate 18, height 5\' 3"  (1.6 m), weight 65.7 kg, SpO2 95 %. Physical Exam  Vitals reviewed. Constitutional: She is oriented to person, place, and time. She appears well-developed and well-nourished. No distress.  HENT:  Head: Normocephalic and atraumatic.  Mouth/Throat: Oropharynx is clear and moist.  Eyes: Pupils are equal, round, and reactive to light. Conjunctivae and EOM are normal. No scleral icterus.  Neck: Normal range of  motion. Neck supple. No JVD present. No tracheal deviation present. No thyromegaly present.  Cardiovascular: Normal rate, regular rhythm and normal heart sounds. Exam reveals no gallop and no friction rub.  No murmur heard. Respiratory: Effort normal and breath sounds normal.  GI: Soft. Bowel sounds are normal. She exhibits no distension. There is no abdominal tenderness.  Genitourinary:    Genitourinary Comments: Deferred   Lymphadenopathy:    She has no cervical adenopathy.  Neurological: She is alert and oriented to person, place, and time. No cranial nerve deficit. She exhibits normal muscle tone.  Skin: Skin is warm and dry. No rash noted. No erythema.  Psychiatric: She has a normal mood and affect. Her behavior is normal. Judgment and thought content normal.     Assessment/Plan This is an 84 year old female admitted for back pain. 1.  Back pain: Intractable pain following kyphoplasty.  IV morphine as needed for severe pain.  The patient already takes Percocet 7.5 mg which I have ordered for moderate pain.  PT and OT prior to discharge.  The patient will likely need SNF placement. 2.  Oral thrush: I have ordered swish and swallow nystatin as well as vitamin C tablets. 3.  Hypertension: Uncontrolled; partly due to pain. Hydralazine as needed. 4.  Hypothyroidism: Check TSH.  Continue Synthroid 5.  Asthma: Continue long-acting bronchial agonist as well as albuterol 6.  Depression: Continue Celexa 7.  DVT prophylaxis: Lovenox 8.  GI prophylaxis: Tapazole per home regimen The patient is a full code.  Time spent on admission orders and patient care approximately 45 minutes  Harrie Foreman, MD 04/19/2019, 6:47 AM

## 2019-04-19 NOTE — ED Notes (Signed)
External cath placed not able to in and out due to pt comfort.

## 2019-04-19 NOTE — Plan of Care (Signed)
  Problem: Education: Goal: Knowledge of General Education information will improve Description Including pain rating scale, medication(s)/side effects and non-pharmacologic comfort measures Outcome: Progressing   

## 2019-04-19 NOTE — ED Notes (Signed)
Pt unable to tolerate I&O performed by April, RN, will attach external cath

## 2019-04-19 NOTE — Progress Notes (Signed)
MRI reviewed, will discuss with her tomorrow.  L4 appears acute and could perform kyphoplsaty if she wished on Monday if OR schedule allows.

## 2019-04-20 LAB — BASIC METABOLIC PANEL
Anion gap: 10 (ref 5–15)
BUN: 13 mg/dL (ref 8–23)
CO2: 24 mmol/L (ref 22–32)
Calcium: 10.6 mg/dL — ABNORMAL HIGH (ref 8.9–10.3)
Chloride: 105 mmol/L (ref 98–111)
Creatinine, Ser: 0.99 mg/dL (ref 0.44–1.00)
GFR calc Af Amer: >60 mL/min (ref >60)
GFR calc non Af Amer: 53 mL/min — ABNORMAL LOW (ref >60)
Glucose, Bld: 101 mg/dL — ABNORMAL HIGH (ref 70–99)
Potassium: 3.4 mmol/L — ABNORMAL LOW (ref 3.5–5.1)
Sodium: 139 mmol/L (ref 135–145)

## 2019-04-20 LAB — MAGNESIUM: Magnesium: 1.9 mg/dL (ref 1.7–2.4)

## 2019-04-20 LAB — CBC
HCT: 41.2 % (ref 36.0–46.0)
Hemoglobin: 13 g/dL (ref 12.0–15.0)
MCH: 27.5 pg (ref 26.0–34.0)
MCHC: 31.6 g/dL (ref 30.0–36.0)
MCV: 87.3 fL (ref 80.0–100.0)
Platelets: 300 10*3/uL (ref 150–400)
RBC: 4.72 MIL/uL (ref 3.87–5.11)
RDW: 20.4 % — ABNORMAL HIGH (ref 11.5–15.5)
WBC: 19.1 10*3/uL — ABNORMAL HIGH (ref 4.0–10.5)
nRBC: 0.2 % (ref 0.0–0.2)

## 2019-04-20 MED ORDER — SODIUM CHLORIDE 0.9 % IV SOLN
1.0000 g | INTRAVENOUS | Status: DC
  Administered 2019-04-20 – 2019-04-21 (×2): 1 g via INTRAVENOUS
  Filled 2019-04-20 (×2): qty 1

## 2019-04-20 MED ORDER — CEFAZOLIN SODIUM-DEXTROSE 1-4 GM/50ML-% IV SOLN
1.0000 g | Freq: Once | INTRAVENOUS | Status: AC
  Administered 2019-04-21: 16:00:00 1 g via INTRAVENOUS
  Filled 2019-04-20: qty 50

## 2019-04-20 MED ORDER — HYDRALAZINE HCL 50 MG PO TABS
50.0000 mg | ORAL_TABLET | Freq: Three times a day (TID) | ORAL | Status: DC
  Administered 2019-04-20 – 2019-04-27 (×15): 50 mg via ORAL
  Filled 2019-04-20 (×17): qty 1

## 2019-04-20 MED ORDER — POTASSIUM CHLORIDE CRYS ER 20 MEQ PO TBCR
40.0000 meq | EXTENDED_RELEASE_TABLET | Freq: Once | ORAL | Status: AC
  Administered 2019-04-20: 40 meq via ORAL
  Filled 2019-04-20: qty 2

## 2019-04-20 NOTE — NC FL2 (Signed)
[place] LEVEL OF CARE SCREENING TOOL     IDENTIFICATION  Patient Name: Veronica Hubbard Birthdate: [birth year] Sex: female Admission Date (Current Location): 04/18/2019  [place] and [place] Number:  Engineering geologist and Address:  [facility], [address], [place],  [address]      Provider Number: [id number]  Attending Physician Name and Address:  Otila Back, MD  Relative Name and Phone Number:  Zettie Pho [phone]    Current Level of Care: Hospital Recommended Level of Care: [place] Prior Approval Number:    Date Approved/Denied:   PASRR Number: [id number]  Discharge Plan: SNF    Current Diagnoses: Patient Active Problem List   Diagnosis Date Noted  . Intractable pain 04/19/2019  . AAA (abdominal aortic aneurysm) without rupture ([place]) 04/09/2017  . Bilateral carotid artery stenosis 04/09/2017  . Thoracic aortic aneurysm without rupture ([place]) 04/09/2017  . Hyponatremia 02/23/2016  . Chest pain 02/23/2016  . Gastroenteritis due to norovirus 02/09/2016    Orientation RESPIRATION BLADDER Height & Weight     Self, Situation, Place  Normal Incontinent Weight: 64.1 kg Height:  5\' 3"  (160 cm)  BEHAVIORAL SYMPTOMS/MOOD NEUROLOGICAL BOWEL NUTRITION STATUS      Continent Diet  AMBULATORY STATUS COMMUNICATION OF NEEDS Skin   Extensive Assist Verbally Surgical wounds, Bruising                       Personal Care Assistance Level of Assistance  Bathing, Dressing, Total care, Feeding Bathing Assistance: Maximum assistance Feeding assistance: Independent Dressing Assistance: Maximum assistance Total Care Assistance: Maximum assistance   Functional Limitations Info  Sight, Speech, Hearing Sight Info: Adequate Hearing Info: Adequate Speech Info: Adequate    SPECIAL CARE FACTORS FREQUENCY  PT (By licensed PT), OT (By licensed OT)     PT Frequency: 5x per week OT Frequency:  5x per week            Contractures Contractures Info: Not present    Additional Factors Info  Code Status, Allergies Code Status Info: Full code Allergies Info: asprin, celecoxib, losartan, verapamil, morhine, iodinated diagnostic agents           Current Medications (04/20/2019):  This is the current hospital active medication list Current Facility-Administered Medications  Medication Dose Route Frequency Provider Last Rate Last Dose  . 0.9 %  sodium chloride infusion   Intravenous Continuous Harrie Foreman, MD   Stopped at 04/20/19 0135  . acetaminophen (TYLENOL) tablet 650 mg  650 mg Oral Q6H PRN Harrie Foreman, MD       Or  . acetaminophen (TYLENOL) suppository 650 mg  650 mg Rectal Q6H PRN Harrie Foreman, MD      . albuterol (PROVENTIL) (2.5 MG/3ML) 0.083% nebulizer solution 2.5 mg  2.5 mg Nebulization Q4H PRN Harrie Foreman, MD      . cefTRIAXone (ROCEPHIN) 1 g in sodium chloride 0.9 % 100 mL IVPB  1 g Intravenous Q24H Ojie, Jude, MD      . cholecalciferol (VITAMIN D3) tablet 1,000 Units  1,000 Units Oral Daily Harrie Foreman, MD   1,000 Units at 04/19/19 0948  . citalopram (CELEXA) tablet 20 mg  20 mg Oral Daily Harrie Foreman, MD   20 mg at 04/19/19 0948  . docusate sodium (COLACE) capsule 100 mg  100 mg Oral BID Harrie Foreman, MD   100 mg at 04/19/19 2154  . enoxaparin (LOVENOX)  injection 40 mg  40 mg Subcutaneous Q24H Harrie Foreman, MD   40 mg at 04/19/19 0948  . fluticasone furoate-vilanterol (BREO ELLIPTA) 100-25 MCG/INH 1 puff  1 puff Inhalation Daily Hallaji, Sheema M, RPH   1 puff at 04/19/19 0950   And  . umeclidinium bromide (INCRUSE ELLIPTA) 62.5 MCG/INH 1 puff  1 puff Inhalation Daily Pernell Dupre, RPH   1 puff at 04/19/19 0950  . hydrALAZINE (APRESOLINE) tablet 25 mg  25 mg Oral Q8H Ojie, Jude, MD   25 mg at 04/20/19 0515  . isosorbide mononitrate (IMDUR) 24 hr tablet 30 mg  30 mg Oral Daily Harrie Foreman, MD   30 mg  at 04/19/19 0948  . levothyroxine (SYNTHROID) tablet 150 mcg  150 mcg Oral QAC breakfast Stark Jock, Jude, MD   150 mcg at 04/20/19 0515  . morphine 2 MG/ML injection 1 mg  1 mg Intravenous Q4H PRN Harrie Foreman, MD   1 mg at 04/19/19 2043  . nystatin (MYCOSTATIN) 100000 UNIT/ML suspension 500,000 Units  5 mL Oral QID Harrie Foreman, MD   500,000 Units at 04/19/19 2154  . omega-3 acid ethyl esters (LOVAZA) capsule 1 g  1 g Oral QHS Harrie Foreman, MD   1 g at 04/19/19 2155  . ondansetron (ZOFRAN) tablet 4 mg  4 mg Oral Q6H PRN Harrie Foreman, MD       Or  . ondansetron [facility]) injection 4 mg  4 mg Intravenous Q6H PRN Harrie Foreman, MD      . oxyCODONE (Oxy IR/ROXICODONE) immediate release tablet 7.5 mg  7.5 mg Oral Q6H PRN Harrie Foreman, MD   7.5 mg at 04/20/19 0515  . potassium chloride SA (K-DUR) CR tablet 40 mEq  40 mEq Oral Once Ojie, Jude, MD      . vitamin C (ASCORBIC ACID) tablet 1,000 mg  1,000 mg Oral Daily Harrie Foreman, MD   1,000 mg at 04/19/19 7591     Discharge Medications: Please see discharge summary for a list of discharge medications.  Relevant Imaging Results:  Relevant Lab Results:   Additional Information ss# [id number]  Latanya Maudlin, RN

## 2019-04-20 NOTE — Progress Notes (Signed)
PT Cancellation Note  Patient Details Name: Veronica Hubbard MRN: [id number] DOB: [birth year]   Cancelled Treatment:    Reason Eval/Treat Not Completed: Other (comment)   Chart reviewed.  New compression fractures found and plan is for kyphoplasty tomorrow.  Pt remains in pain with movement.  Will hold session today and continue as appropriate.   Chesley Noon 04/20/2019, 10:21 AM

## 2019-04-20 NOTE — Consult Note (Signed)
ORTHOPAEDIC CONSULTATION  PATIENT NAME: Veronica Hubbard DOB: [birth year]  MRN: [id number]  REQUESTING PHYSICIAN: Otila Back, MD  Chief Complaint: Low back pain  HPI: Veronica Hubbard is a 84 y.o. female who complains of severe low back pain.  The patient underwent L3 kyphoplasty on April 10, 2019 for a vertebral compression fracture.  She did reasonably well postoperatively but had the onset of severe low back pain again on the date of admission.  She denied any trauma, coughing, or sneezing.  She denied any aggravating event.  Any attempt at sitting up or moving increases the pain.  She denied any radiation of pain into the legs.  She denied any bowel or bladder dysfunction.  Past Medical History:  Diagnosis Date  . Asthma   . Cancer ([place]) 02/2018   bone marrow cancer  . Depression   . GERD (gastroesophageal reflux disease)   . Hypertension   . Hypothyroidism   . Personal history of chemotherapy   . Spinal stenosis    Past Surgical History:  Procedure Laterality Date  . ABDOMINAL HYSTERECTOMY    . BACK SURGERY    . BREAST BIOPSY Left 2003   neg  . cataracts    . KYPHOPLASTY N/A 04/10/2019   Procedure: KYPHOPLASTY L3;  Surgeon: Hessie Knows, MD;  Location: [facility] ORS;  Service: Orthopedics;  Laterality: N/A;  . lipoma removal    . SHOULDER SURGERY     Social History   Socioeconomic History  . Marital status: Single    Spouse name: Not on file  . Number of children: Not on file  . Years of education: Not on file  . Highest education level: Not on file  Occupational History  . Not on file  Social Needs  . Financial resource strain: Not on file  . Food insecurity    Worry: Not on file    Inability: Not on file  . Transportation needs    Medical: Not on file    Non-medical: Not on file  Tobacco Use  . Smoking status: Former Smoker    Types: Cigarettes  . Smokeless tobacco: Never Used  Substance and Sexual Activity  . Alcohol use: No  . Drug use: No  .  Sexual activity: Not on file  Lifestyle  . Physical activity    Days per week: Not on file    Minutes per session: Not on file  . Stress: Not on file  Relationships  . Social Herbalist on phone: Not on file    Gets together: Not on file    Attends religious service: Not on file    Active member of club or organization: Not on file    Attends meetings of clubs or organizations: Not on file    Relationship status: Not on file  Other Topics Concern  . Not on file  Social History Narrative  . Not on file   Family History  Problem Relation Age of Onset  . Leukemia Mother   . CAD Father   . Diabetes Father   . Breast cancer Sister 68   Allergies  Allergen Reactions  . Aspirin Shortness Of Breath  . Celecoxib Shortness Of Breath  . Iodinated Diagnostic Agents Other (See Comments)    Reaction:  Decreased kidney function  . Losartan Itching  . Morphine Nausea And Vomiting  . Verapamil Itching   Prior to Admission medications   Medication Sig Start Date End Date Taking? Authorizing Provider  allopurinol (ZYLOPRIM) 100  MG tablet Take 200 mg by mouth daily. 05/04/18  Yes [provider]  azelastine (ASTELIN) 0.1 % nasal spray Place 1 spray into both nostrils daily. Use in each nostril as directed   Yes [provider]  budesonide (PULMICORT) 0.5 MG/2ML nebulizer solution Take 0.5 mg by nebulization 2 (two) times daily.   Yes [provider]  Cholecalciferol (VITAMIN D3) 25 MCG (1000 UT) CAPS Take 1,000 Units by mouth daily.   Yes [provider]  citalopram (CELEXA) 20 MG tablet Take 20 mg by mouth daily.   Yes [provider]  cyclobenzaprine (FLEXERIL) 5 MG tablet Take 5-10 mg by mouth at bedtime as needed for muscle spasms.   Yes [provider]  ezetimibe (ZETIA) 10 MG tablet Take 10 mg by mouth at bedtime.    Yes [provider]  fluticasone (FLONASE) 50 MCG/ACT nasal spray Place 1 spray into both nostrils  2 (two) times a day.   Yes [provider]  Fluticasone-Umeclidin-Vilant 100-62.5-25 MCG/INH AEPB Inhale 1 puff into the lungs daily.   Yes [provider]  ipratropium-albuterol (DUONEB) 0.5-2.5 (3) MG/3ML SOLN Take 3 mLs by nebulization 4 (four) times daily as needed.   Yes [provider]  isosorbide mononitrate (IMDUR) 30 MG 24 hr tablet Take 30 mg by mouth daily.   Yes [provider]  levothyroxine (SYNTHROID) 112 MCG tablet Take 112 mcg by mouth daily before breakfast.    Yes [provider]  Melatonin 5 MG TABS Take 5 mg by mouth at bedtime.   Yes [provider]  nystatin (MYCOSTATIN) 100000 UNIT/ML suspension Take 4 mLs by mouth 4 (four) times daily. Swish and hold in mouth for as long as possible then swallow   Yes [provider]  ondansetron (ZOFRAN-ODT) 4 MG disintegrating tablet Take 4 mg by mouth every 8 (eight) hours as needed for nausea or vomiting.   Yes [provider]  pantoprazole (PROTONIX) 40 MG tablet Take 40 mg by mouth 2 (two) times daily.   Yes [provider]   Dg Lumbar Spine 2-3 Views  Result Date: 04/19/2019 CLINICAL DATA:  Pain status post fall. EXAM: LUMBAR SPINE - 2-3 VIEW COMPARISON:  None. FINDINGS: The patient has undergone L3 kyphoplasty. Aortic calcifications are noted. There is a probable abdominal aortic aneurysm measuring approximately 4.5 cm. There is endplate sclerosis involving the L4 vertebral body. There is some sclerosis involving the inferior endplate of the L 2 vertebral body which is favored to be degenerative in etiology. There is grade 1 anterolisthesis of L3 on L4, likely degenerative. There are multilevel degenerative changes throughout the lumbar spine including advanced facet arthrosis at the lower lumbar segments. IMPRESSION: 1. There is mild endplate sclerosis involving the superior endplate of the L4 vertebral body. This is age indeterminate. Correlation with point  tenderness and physical exam is recommended. If there is clinical concern for an acute fracture, follow-up with cross-sectional imaging is recommended. 2. Status post L3 kyphoplasty. 3. Probable abdominal aortic aneurysm measuring 4.5 cm. Follow-up with a nonemergent outpatient ultrasound is recommended for further evaluation. 4. Advanced degenerative changes throughout the lumbar spine. Electronically Signed   By: Constance Holster M.D.   On: 04/19/2019 01:32   Mr Lumbar Spine Wo Contrast  Result Date: 04/19/2019 CLINICAL DATA:  Back pain.  Kyphoplasty at L3 on 04/10/2019. EXAM: MRI LUMBAR SPINE WITHOUT CONTRAST TECHNIQUE: Multiplanar, multisequence MR imaging of the lumbar spine was performed. No intravenous contrast was administered. COMPARISON:  04/19/2019 CT scan FINDINGS: Segmentation: The lowest lumbar type non-rib-bearing vertebra is labeled as L5. Alignment: 6 mm anterolisthesis L3 on L4 associated with a chronic right L3 pars defect. 5 mm degenerative anterolisthesis at L4-5. Vertebrae: Oblique fracture plane in the L3 vertebral body as seen on the CT scan, with methacrylate partially tracking along the transverse fracture plane. Overall there is about 15% loss of height of the L3 vertebral body. Type 2 degenerative endplate findings along the inferior endplate of L2. There is an oblique band of edema extending in the right anterior L4 vertebral body the probably related to a right anterior superior compression of the endplate. Disc desiccation throughout the lumbar spine with loss of disc height especially at L5-S1. Conus medullaris and cauda equina: Conus extends to the L1-2 level. Conus and cauda equina appear normal. Paraspinal and other soft tissues: Numerous bilateral renal cysts of varying complexity are incompletely assessed on today's exam. Some of these have low T2 and high T1 signal characteristics. There is paravertebral edema at L3 and L4, right greater than left. This is best appreciated  on the sagittal inversion recovery weighted images. Disc levels: L1-2: Unremarkable. L2-3: Moderate to prominent left and moderate right foraminal stenosis and moderate central narrowing of the thecal sac due to facet and intervertebral spurring along with diffuse disc bulge. Suspected prior past laminectomies. L3-4: Moderate central narrowing of the thecal sac related to disc uncovering and facet arthropathy. Prior laminectomies. L4-5: Borderline bilateral subarticular lateral recess stenosis due to facet arthropathy and subluxation. L5-S1: No impingement. Intervertebral spurring extends inferiorly in the right neural foramen. IMPRESSION: 1. Primarily transverse L3 fracture, with some partial filling in the fracture plane with methacrylate and about 15% loss of height. This is roughly stable compared to 04/19/2019 CT scan. 2. There is superior endplate concavity and edema eccentric to the right in the L4 vertebral body suggesting a mild superior endplate compression fracture. 3. Continued anterolisthesis at L3 and L4; at L3 a chronic right pars defect is likely contributory. 4. Lumbar spondylosis and degenerative disc disease cause moderate impingement at L2-3 and L3-4, as noted above. 5. Postoperative findings including laminectomies at L2-3 and L3-4. Electronically Signed   By: Van Clines M.D.   On: 04/19/2019 15:15   Ct L-spine No Charge  Result Date: 04/19/2019 CLINICAL DATA:  Recent fall with fracture and kyphoplasty performed 04/10/2019. Increasing back pain. EXAM: CT LUMBAR SPINE WITHOUT CONTRAST TECHNIQUE: Multidetector CT imaging of the lumbar spine was performed without intravenous contrast administration. Multiplanar CT image reconstructions were also generated. COMPARISON:  Abdominal CT performed today. Plain films 04/10/2019. CT 02/23/2016 FINDINGS: Segmentation: 5 lumbar type vertebral bodies. Alignment: Slight anterolisthesis at L3-4 related to facet disease. Vertebrae: Fracture noted  through the L3 vertebral body with fracture lines remain evident. Vertebral augmentation changes noted in the L3 vertebral body. There is superior endplate cortical irregularity at L4, best seen along the anterior superior corner. No significant loss in vertebral body height. Paraspinal and other soft tissues: Negative. Disc levels: Diffuse degenerative disc disease and facet disease. IMPRESSION: L3 vertebral body fracture status post vertebral augmentation. Fracture lines remain evident. Fracture through the superior endplate of L4, best seen along the anterior superior corner. No loss in vertebral body height. Diffuse degenerative disc and facet disease. Electronically Signed   By: Rolm Baptise M.D.   On: 04/19/2019 03:50   Ct Renal Stone Study  Result Date: 04/19/2019 CLINICAL DATA:  Fall last night. L4 fracture. Increasing back pain. EXAM: CT  ABDOMEN AND PELVIS WITHOUT CONTRAST TECHNIQUE: Multidetector CT imaging of the abdomen and pelvis was performed following the standard protocol without IV contrast. COMPARISON:  Plain film 04/19/2019 and 04/10/2019.  CT 02/23/2016. FINDINGS: Lower chest: Mild cardiomegaly. Aortic atherosclerosis. No acute abnormality. Hepatobiliary: No focal hepatic abnormality. Gallbladder unremarkable. Pancreas: No focal abnormality or ductal dilatation. Spleen: Splenomegaly with a craniocaudal length of the spleen measuring 18 cm. This is stable when compared to prior CT from 2017. Adrenals/Urinary Tract: Multiple right renal cysts. 6 mm nonobstructing stone in the lower pole of the right kidney. Renovascular calcifications. Punctate nonobstructing stone in the midpole of the left kidney. No hydronephrosis. Urinary bladder and adrenal glands unremarkable. Stomach/Bowel: Stomach, large and small bowel grossly unremarkable. Vascular/Lymphatic: Aortic atherosclerosis. 3.9 cm abdominal aortic aneurysm. No adenopathy. Reproductive: Prior hysterectomy.  No adnexal masses. Other: No free  fluid or free air. Musculoskeletal: Fracture involving the L3 vertebral body with changes of vertebroplasty. Fracture lines remain evident. Probable fracture through the superior endplate of L4 without loss of vertebral body height. Degenerative facet disease throughout the lumbar spine. Slight anterolisthesis at L3-4, stable. IMPRESSION: L3 fracture, status post vertebroplasty. Fracture lines remain evident. Slight irregularity along the superior endplate of L4 may reflect mild fracture without significant loss of vertebral body height. No retropulsed fracture fragments. Bilateral nephrolithiasis.  No hydronephrosis. Splenomegaly, stable since 2017. Diffuse aortoiliac atherosclerosis. 3.9 cm abdominal aortic aneurysm. Recommend followup by ultrasound in 2 years. This recommendation follows ACR consensus guidelines: White Paper of the ACR Incidental Findings Committee II on Vascular Findings. J Am Coll Radiol 2013; 10:789-794. Aortic aneurysm NOS (ICD10-I71.9) Electronically Signed   By: Rolm Baptise M.D.   On: 04/19/2019 03:47    Positive ROS: All other systems have been reviewed and were otherwise negative with the exception of those mentioned in the HPI and as above.  Physical Exam: General: Well developed, well nourished female seen in no acute distress. HEENT: Atraumatic and normocephalic. Sclera are clear. Extraocular motion is intact. Oropharynx is clear with moist mucosa. Neck: Supple, nontender, good range of motion. No JVD or carotid bruits. Lungs: Clear to auscultation bilaterally. Cardiovascular: Regular rate and rhythm with normal S1 and S2. No murmurs. No gallops or rubs. Pedal pulses are palpable bilaterally. Homans test is negative bilaterally. No significant pretibial or ankle edema. Abdomen: Soft, nontender, and nondistended. Bowel sounds are present. Skin: No lesions in the area of chief complaint Neurologic: Awake, alert, and oriented. Sensory function is grossly intact. Motor  strength is felt to be 5 over 5 bilaterally. No clonus or tremor. Good motor coordination. Lymphatic: No axillary or cervical lymphadenopathy  MUSCULOSKELETAL: Examination of the lumbar spine demonstrates severe tenderness to palpation in the lower lumbar region.  There is some paraspinous spasm.  Guarding is noted with any attempt at motion of the lumbar spine.    Assessment: L4 vertebral compression fracture  Plan: The findings were discussed with the patient.  Dr. Rudene Christians has been contacted and believes the patient is a candidate for kyphoplasty.  The patient would like to proceed with such intervention.  James P. Holley Bouche M.D.

## 2019-04-20 NOTE — TOC Initial Note (Signed)
Transition of Care [facility]) - Initial/Assessment Note    Patient Details  Name: Veronica Hubbard MRN: [id number] Date of Birth: 04-12-1935  [facility] CM/SW Contact:    Latanya Maudlin, RN Phone Number: 04/20/2019, 8:51 AM  Clinical Narrative: TOC consulted to assist with disposition. Patient currently lives at home alone but has almost daily visits from her sister. Patients sister provides all transport. Patient uses a walker and bedside commode at home. Patient has had home health before and although PT is recommending SNF patient really wishes to go home. Patient being seen by ortho and possible for kyphoplasty on Monday. Patient acknowledges her strength is less than optimal but she reports that is mainly because of pain control issues. Patient would like for [facility] team to start FL2 and obtain bed offers in case she is not progressed to be able to go home. She expresses that she had a bad experience with [facility] and wishes to not return there. CMS Medicare.gov Compare Post Acute Care list reviewed with patient and she prefers [facility]. Notified Corene Cornea of referral and patient likely to go home but may also go to short term rehab if not enough progress is made here. Patient would like home health after rehab as well if that is the route chosen.   Will do FL2 and send out info through the hub for bed offers. [facility] will continue to follow for discharge planning.                    Expected Discharge Plan: [place] Barriers to Discharge: Continued Medical Work up   Patient Goals and CMS Choice Patient states their goals for this hospitalization and ongoing recovery are:: to not have to deal with this pain CMS Medicare.gov Compare Post Acute Care list provided to:: Patient Choice offered to / list presented to : Patient  Expected Discharge Plan and Services Expected Discharge Plan: [place]   Discharge Planning  Services: CM Consult Post Acute Care Choice: [place] arrangements for the past 2 months: Single Family Home                           HH Arranged: PT, Nurse's Aide, RN [place] Agency: [place] ([facility]) Date HH Agency Contacted: 04/20/19 Time [place]: [phone] Representative spoke with at [place]: Corene Cornea  Prior Living Arrangements/Services Living arrangements for the past 2 months: [place] with:: Self Patient language and need for interpreter reviewed:: Yes Do you feel safe going back to the place where you live?: Yes      Need for Family Participation in Patient Care: No (Comment)   Current home services: DME Criminal Activity/Legal Involvement Pertinent to Current Situation/Hospitalization: No - Comment as needed  Activities of Daily Living Home Assistive Devices/Equipment: Gilford Rile (specify type) ADL Screening (condition at time of admission) Patient's cognitive ability adequate to safely complete daily activities?: Yes Is the patient deaf or have difficulty hearing?: Yes Does the patient have difficulty seeing, even when wearing glasses/contacts?: No Does the patient have difficulty concentrating, remembering, or making decisions?: No Patient able to express need for assistance with ADLs?: Yes Does the patient have difficulty dressing or bathing?: Yes Independently performs ADLs?: Yes (appropriate for developmental age) Does the patient have difficulty walking or climbing stairs?: Yes Weakness of Legs: Both Weakness of Arms/Hands: None  Permission Sought/Granted Permission  sought to share information with : Case Manager                Emotional Assessment Appearance:: Appears stated age Attitude/Demeanor/Rapport: Gracious Affect (typically observed): Apprehensive Orientation: : Oriented to Self, Oriented to Place, Oriented to  Time, Oriented to Situation      Admission diagnosis:  Back pain [M54.9] Intractable  back pain [M54.9] Volume depletion [E86.9] Leukocytosis, unspecified type [D72.829] Patient Active Problem List   Diagnosis Date Noted  . Intractable pain 04/19/2019  . AAA (abdominal aortic aneurysm) without rupture ([place]) 04/09/2017  . Bilateral carotid artery stenosis 04/09/2017  . Thoracic aortic aneurysm without rupture ([place]) 04/09/2017  . Hyponatremia 02/23/2016  . Chest pain 02/23/2016  . Gastroenteritis due to norovirus 02/09/2016   PCP:  Zhou-Talbert, Elwyn Lade, MD Pharmacy:   [place] [place] [address] [place] [address] Phone: [phone] Fax: [phone]     Social Determinants of Health (SDOH) Interventions    Readmission Risk Interventions No flowsheet data found.

## 2019-04-20 NOTE — Progress Notes (Signed)
Patient reports pretty significant pain when turning in bed today.  On exam she was very tender to percussion at the L4 level below the prior kyphoplasty with prior well-healed incision.  She was not able to ambulate today just sit up on the side of the bed. On review of her CT and MRI there is new edema at L4 by due to concern about edema persisting at L3 as well and I think if she is going to have the procedure would do repeat at L3 as well as L4 kyphoplasty to prevent recurrence of his back pain symptoms.  I discussed this with her in detail.  If operating room schedule allows approach we will do this tomorrow if not would be Tuesday afternoon.

## 2019-04-20 NOTE — Progress Notes (Signed)
[place] at [place] NAME: Veronica Hubbard    MR#:  [id number]  DATE OF BIRTH:  1935/08/05  SUBJECTIVE:  CHIEF COMPLAINT:   Chief Complaint  Patient presents with  . Back Pain   No new complaints this morning.  No fevers.  Patient evaluated by orthopedic surgeon this morning.  Found to have new L4 compression fracture.  Plans for possible kyphoplasty in a.m.  Nursing staff reported patient voiding well this morning with recent voiding of 500 cc with postvoid residual of only about 70 cc.  REVIEW OF SYSTEMS:  Review of Systems  Constitutional: Negative for chills and fever.  HENT: Negative for hearing loss and tinnitus.   Eyes: Negative for blurred vision and double vision.  Respiratory: Negative for cough and hemoptysis.   Cardiovascular: Negative for chest pain and palpitations.  Gastrointestinal: Negative for heartburn and nausea.  Genitourinary: Negative for dysuria and urgency.  Musculoskeletal: Negative for myalgias and neck pain.  Skin: Negative for itching and rash.  Neurological: Negative for dizziness and headaches.  Psychiatric/Behavioral: Negative for depression and hallucinations.    DRUG ALLERGIES:   Allergies  Allergen Reactions  . Aspirin Shortness Of Breath  . Celecoxib Shortness Of Breath  . Iodinated Diagnostic Agents Other (See Comments)    Reaction:  Decreased kidney function  . Losartan Itching  . Morphine Nausea And Vomiting  . Verapamil Itching   VITALS:  Blood pressure (!) 162/69, pulse 74, temperature 98.1 F (36.7 C), resp. rate 16, height 5\' 3"  (1.6 m), weight 64.1 kg, SpO2 97 %. PHYSICAL EXAMINATION:    Physical Exam  Constitutional: She is oriented to person, place, and time. She appears well-developed.  HENT:  Head: Normocephalic and atraumatic.  Right Ear: External ear normal.  Eyes: Pupils are equal, round, and reactive to light. Conjunctivae are normal. Right eye exhibits no discharge.   Neck: Normal range of motion. Neck supple. No tracheal deviation present.  Cardiovascular: Normal rate, regular rhythm and normal heart sounds.  Respiratory: Effort normal. She has no wheezes.  GI: Soft. Bowel sounds are normal. She exhibits no distension. There is no abdominal tenderness.  Genitourinary:    Genitourinary Comments: Urinary retention.   Musculoskeletal: Normal range of motion.        General: No edema.  Neurological: She is alert and oriented to person, place, and time.  Skin: Skin is warm. She is not diaphoretic. No erythema.  Psychiatric: She has a normal mood and affect.   LABORATORY PANEL:  Female CBC Recent Labs  Lab 04/20/19 0546  WBC 19.1*  HGB 13.0  HCT 41.2  PLT 300   ------------------------------------------------------------------------------------------------------------------ Chemistries  Recent Labs  Lab 04/20/19 0546  NA 139  K 3.4*  CL 105  CO2 24  GLUCOSE 101*  BUN 13  CREATININE 0.99  CALCIUM 10.6*  MG 1.9   RADIOLOGY:  Mr Lumbar Spine Wo Contrast  Result Date: 04/19/2019 CLINICAL DATA:  Back pain.  Kyphoplasty at L3 on 04/10/2019. EXAM: MRI LUMBAR SPINE WITHOUT CONTRAST TECHNIQUE: Multiplanar, multisequence MR imaging of the lumbar spine was performed. No intravenous contrast was administered. COMPARISON:  04/19/2019 CT scan FINDINGS: Segmentation: The lowest lumbar type non-rib-bearing vertebra is labeled as L5. Alignment: 6 mm anterolisthesis L3 on L4 associated with a chronic right L3 pars defect. 5 mm degenerative anterolisthesis at L4-5. Vertebrae: Oblique fracture plane in the L3 vertebral body as seen on the CT scan, with methacrylate partially tracking along the transverse fracture  plane. Overall there is about 15% loss of height of the L3 vertebral body. Type 2 degenerative endplate findings along the inferior endplate of L2. There is an oblique band of edema extending in the right anterior L4 vertebral body the probably related to  a right anterior superior compression of the endplate. Disc desiccation throughout the lumbar spine with loss of disc height especially at L5-S1. Conus medullaris and cauda equina: Conus extends to the L1-2 level. Conus and cauda equina appear normal. Paraspinal and other soft tissues: Numerous bilateral renal cysts of varying complexity are incompletely assessed on today's exam. Some of these have low T2 and high T1 signal characteristics. There is paravertebral edema at L3 and L4, right greater than left. This is best appreciated on the sagittal inversion recovery weighted images. Disc levels: L1-2: Unremarkable. L2-3: Moderate to prominent left and moderate right foraminal stenosis and moderate central narrowing of the thecal sac due to facet and intervertebral spurring along with diffuse disc bulge. Suspected prior past laminectomies. L3-4: Moderate central narrowing of the thecal sac related to disc uncovering and facet arthropathy. Prior laminectomies. L4-5: Borderline bilateral subarticular lateral recess stenosis due to facet arthropathy and subluxation. L5-S1: No impingement. Intervertebral spurring extends inferiorly in the right neural foramen. IMPRESSION: 1. Primarily transverse L3 fracture, with some partial filling in the fracture plane with methacrylate and about 15% loss of height. This is roughly stable compared to 04/19/2019 CT scan. 2. There is superior endplate concavity and edema eccentric to the right in the L4 vertebral body suggesting a mild superior endplate compression fracture. 3. Continued anterolisthesis at L3 and L4; at L3 a chronic right pars defect is likely contributory. 4. Lumbar spondylosis and degenerative disc disease cause moderate impingement at L2-3 and L3-4, as noted above. 5. Postoperative findings including laminectomies at L2-3 and L3-4. Electronically Signed   By: Van Clines M.D.   On: 04/19/2019 15:15   ASSESSMENT AND PLAN:  This is an 84 year old female  admitted for back pain.  1.  Back pain: Intractable pain following kyphoplasty done by Dr. Rudene Christians from 04/10/2019. Pains appear better controlled with current regimen. Patient seen by orthopedic service.  Found to have new L4 compression fracture.  Plans for possible kyphoplasty by Dr. Rudene Christians in a.m. Physical therapy and OT evaluation.  2.  Acute urinary retention. Bladder scan revealed 800 cc yesterday..  Nursing staff was unable to place Foley catheter.  Urology consultation was placed. Later notified by nursing staff that patient voided about 600 cc.  Patient seen by urologist.  Appreciate input.  No Foley catheter placed at this time since patient appears to be voiding well.  Voided about 500 cc this morning with postvoid residual of only about 70 cc.  3.    Urinary tract infection ; present on admission. Started on empiric antibiotics with Rocephin pending results of urine culture and sensitivities.  4.  Oral thrush: Continue swish and swallow nystatin as well as vitamin C tablets.  5.  Hypertension: Uncontrolled; partly due to pain. Increased dose of hydralazine.  Continue isosorbide.  Hydralazine as needed.  6.  Hypothyroidism:  TSH level elevated at 9.749.   Increased dose of Synthroid from 112 to 150 mcg p.o. daily.  Repeat thyroid function test by primary care physician as outpatient  7.  Asthma: Continue long-acting bronchial agonist as well as albuterol  8.  Depression: Continue Celexa    DVT prophylaxis: Lovenox   All the records are reviewed and case discussed with Care Management/Social  Worker. Management plans discussed with the patient, family and they are in agreement.  CODE STATUS: Full Code  TOTAL TIME TAKING CARE OF THIS PATIENT: 35 minutes.   More than 50% of the time was spent in counseling/coordination of care: YES  POSSIBLE D/C IN 3 DAYS, DEPENDING ON CLINICAL CONDITION.   Veronica Hubbard M.D on 04/20/2019 at 11:11 AM  Between 7am to 6pm - Pager -  [phone]  After 6pm go to [email or web address] - Proofreader  [facility] [place]  Office  [phone]  CC: Primary care physician; Zhou-Talbert, Elwyn Lade, MD  Note: This dictation was prepared with Dragon dictation along with smaller phrase technology. Any transcriptional errors that result from this process are unintentional.

## 2019-04-20 NOTE — Progress Notes (Signed)
Patient having difficulty urinating in bedpan and with purewick. Difficult 2 assist to bsc voids 230ml. Unable to place foley in.

## 2019-04-21 DIAGNOSIS — Z91041 Radiographic dye allergy status: Secondary | ICD-10-CM | POA: Diagnosis not present

## 2019-04-21 DIAGNOSIS — D473 Essential (hemorrhagic) thrombocythemia: Secondary | ICD-10-CM | POA: Diagnosis not present

## 2019-04-21 DIAGNOSIS — I714 Abdominal aortic aneurysm, without rupture: Secondary | ICD-10-CM | POA: Diagnosis present

## 2019-04-21 DIAGNOSIS — I129 Hypertensive chronic kidney disease with stage 1 through stage 4 chronic kidney disease, or unspecified chronic kidney disease: Secondary | ICD-10-CM | POA: Diagnosis present

## 2019-04-21 DIAGNOSIS — Z85830 Personal history of malignant neoplasm of bone: Secondary | ICD-10-CM | POA: Diagnosis not present

## 2019-04-21 DIAGNOSIS — Z886 Allergy status to analgesic agent status: Secondary | ICD-10-CM | POA: Diagnosis not present

## 2019-04-21 DIAGNOSIS — N183 Chronic kidney disease, stage 3 (moderate): Secondary | ICD-10-CM | POA: Diagnosis present

## 2019-04-21 DIAGNOSIS — E039 Hypothyroidism, unspecified: Secondary | ICD-10-CM | POA: Diagnosis present

## 2019-04-21 DIAGNOSIS — Z833 Family history of diabetes mellitus: Secondary | ICD-10-CM | POA: Diagnosis not present

## 2019-04-21 DIAGNOSIS — R161 Splenomegaly, not elsewhere classified: Secondary | ICD-10-CM | POA: Diagnosis not present

## 2019-04-21 DIAGNOSIS — C969 Malignant neoplasm of lymphoid, hematopoietic and related tissue, unspecified: Secondary | ICD-10-CM | POA: Diagnosis not present

## 2019-04-21 DIAGNOSIS — Z87891 Personal history of nicotine dependence: Secondary | ICD-10-CM | POA: Diagnosis not present

## 2019-04-21 DIAGNOSIS — Z888 Allergy status to other drugs, medicaments and biological substances status: Secondary | ICD-10-CM | POA: Diagnosis not present

## 2019-04-21 DIAGNOSIS — N390 Urinary tract infection, site not specified: Secondary | ICD-10-CM | POA: Diagnosis present

## 2019-04-21 DIAGNOSIS — Z8249 Family history of ischemic heart disease and other diseases of the circulatory system: Secondary | ICD-10-CM | POA: Diagnosis not present

## 2019-04-21 DIAGNOSIS — M549 Dorsalgia, unspecified: Secondary | ICD-10-CM | POA: Diagnosis present

## 2019-04-21 DIAGNOSIS — D649 Anemia, unspecified: Secondary | ICD-10-CM | POA: Diagnosis not present

## 2019-04-21 DIAGNOSIS — J45909 Unspecified asthma, uncomplicated: Secondary | ICD-10-CM | POA: Diagnosis present

## 2019-04-21 DIAGNOSIS — Z9221 Personal history of antineoplastic chemotherapy: Secondary | ICD-10-CM | POA: Diagnosis not present

## 2019-04-21 DIAGNOSIS — B370 Candidal stomatitis: Secondary | ICD-10-CM | POA: Diagnosis present

## 2019-04-21 DIAGNOSIS — Z885 Allergy status to narcotic agent status: Secondary | ICD-10-CM | POA: Diagnosis not present

## 2019-04-21 DIAGNOSIS — S32040A Wedge compression fracture of fourth lumbar vertebra, initial encounter for closed fracture: Secondary | ICD-10-CM | POA: Diagnosis present

## 2019-04-21 DIAGNOSIS — Z79890 Hormone replacement therapy (postmenopausal): Secondary | ICD-10-CM | POA: Diagnosis not present

## 2019-04-21 DIAGNOSIS — K219 Gastro-esophageal reflux disease without esophagitis: Secondary | ICD-10-CM | POA: Diagnosis present

## 2019-04-21 DIAGNOSIS — Z7951 Long term (current) use of inhaled steroids: Secondary | ICD-10-CM | POA: Diagnosis not present

## 2019-04-21 DIAGNOSIS — Z806 Family history of leukemia: Secondary | ICD-10-CM | POA: Diagnosis not present

## 2019-04-21 DIAGNOSIS — M4800 Spinal stenosis, site unspecified: Secondary | ICD-10-CM | POA: Diagnosis present

## 2019-04-21 DIAGNOSIS — Z20828 Contact with and (suspected) exposure to other viral communicable diseases: Secondary | ICD-10-CM | POA: Diagnosis present

## 2019-04-21 DIAGNOSIS — Z803 Family history of malignant neoplasm of breast: Secondary | ICD-10-CM | POA: Diagnosis not present

## 2019-04-21 LAB — CBC
HCT: 40.6 % (ref 36.0–46.0)
Hemoglobin: 12.9 g/dL (ref 12.0–15.0)
MCH: 27.3 pg (ref 26.0–34.0)
MCHC: 31.8 g/dL (ref 30.0–36.0)
MCV: 85.8 fL (ref 80.0–100.0)
Platelets: 312 10*3/uL (ref 150–400)
RBC: 4.73 MIL/uL (ref 3.87–5.11)
RDW: 20.4 % — ABNORMAL HIGH (ref 11.5–15.5)
WBC: 19.8 10*3/uL — ABNORMAL HIGH (ref 4.0–10.5)
nRBC: 0.2 % (ref 0.0–0.2)

## 2019-04-21 LAB — BASIC METABOLIC PANEL
Anion gap: 10 (ref 5–15)
BUN: 15 mg/dL (ref 8–23)
CO2: 25 mmol/L (ref 22–32)
Calcium: 10.5 mg/dL — ABNORMAL HIGH (ref 8.9–10.3)
Chloride: 103 mmol/L (ref 98–111)
Creatinine, Ser: 1.05 mg/dL — ABNORMAL HIGH (ref 0.44–1.00)
GFR calc Af Amer: 57 mL/min — ABNORMAL LOW (ref >60)
GFR calc non Af Amer: 49 mL/min — ABNORMAL LOW (ref >60)
Glucose, Bld: 112 mg/dL — ABNORMAL HIGH (ref 70–99)
Potassium: 3.9 mmol/L (ref 3.5–5.1)
Sodium: 138 mmol/L (ref 135–145)

## 2019-04-21 LAB — URINE CULTURE
Culture: >=100000 — AB
Special Requests: NORMAL

## 2019-04-21 LAB — PATHOLOGIST SMEAR REVIEW

## 2019-04-21 LAB — NOVEL CORONAVIRUS, NAA (HOSP ORDER, SEND-OUT TO REF LAB; TAT 18-24 HRS): SARS-CoV-2, NAA: NOT DETECTED

## 2019-04-21 LAB — MAGNESIUM: Magnesium: 2 mg/dL (ref 1.7–2.4)

## 2019-04-21 MED ORDER — SODIUM CHLORIDE 0.9 % IV SOLN
1.0000 g | Freq: Three times a day (TID) | INTRAVENOUS | Status: DC
  Administered 2019-04-21 – 2019-04-23 (×7): 1 g via INTRAVENOUS
  Filled 2019-04-21: qty 1000
  Filled 2019-04-21: qty 1
  Filled 2019-04-21 (×2): qty 1000
  Filled 2019-04-21: qty 1
  Filled 2019-04-21: qty 1000
  Filled 2019-04-21: qty 1
  Filled 2019-04-21: qty 1000
  Filled 2019-04-21 (×2): qty 1

## 2019-04-21 MED ORDER — MIDAZOLAM HCL 2 MG/2ML IJ SOLN
INTRAMUSCULAR | Status: AC
  Filled 2019-04-21: qty 2

## 2019-04-21 MED ORDER — MORPHINE SULFATE (PF) 2 MG/ML IV SOLN
2.0000 mg | INTRAVENOUS | Status: DC | PRN
  Administered 2019-04-22 – 2019-04-24 (×4): 2 mg via INTRAVENOUS
  Filled 2019-04-21 (×4): qty 1

## 2019-04-21 MED ORDER — ALUM & MAG HYDROXIDE-SIMETH 200-200-20 MG/5ML PO SUSP
30.0000 mL | Freq: Four times a day (QID) | ORAL | Status: DC | PRN
  Administered 2019-04-21: 30 mL via ORAL
  Filled 2019-04-21: qty 30

## 2019-04-21 MED ORDER — FENTANYL CITRATE (PF) 100 MCG/2ML IJ SOLN
INTRAMUSCULAR | Status: AC
  Filled 2019-04-21: qty 2

## 2019-04-21 NOTE — TOC Progression Note (Signed)
Transition of Care [facility]) - Progression Note    Patient Details  Name: Veronica Hubbard MRN: [id number] Date of Birth: Mar 22, 1935  [facility]) CM/SW Contact  Keyasia Jolliff, Lenice Llamas Phone Number: [phone]  04/21/2019, 4:37 PM  Clinical Narrative:   Patient and her sister [name] chose [facility]. [facility] liaison is aware of accepted bed offer. CSW contacted [facility] and made them aware of above. [facility] SNF authorization has been received through [facility], authorization # [id number].     Expected Discharge Plan: [place] Barriers to Discharge: Continued Medical Work up  Expected Discharge Plan and Services Expected Discharge Plan: [place]   Discharge Planning Services: CM Consult Post Acute Care Choice: [place] arrangements for the past 2 months: Single Family Home                           HH Arranged: PT, Nurse's Aide, RN [place] Agency: [place] ([facility]) Date HH Agency Contacted: 04/20/19 Time [place]: [phone] Representative spoke with at [place]: [place] ([place]) Interventions    Readmission Risk Interventions No flowsheet data found.

## 2019-04-21 NOTE — TOC Progression Note (Signed)
Transition of Care [facility]) - Progression Note    Patient Details  Name: Veronica Hubbard MRN: [id number] Date of Birth: 1935/07/08  Transition of Care [facility]) CM/SW Contact  Azusena Erlandson, Lenice Llamas Phone Number: [phone]  04/21/2019, 3:01 PM  Clinical Narrative:   Clinical Social Worker ([place]) started [employer] SNF authorization through [place] [facility] today. CSW attempted to contact patient via telephone to give her SNF bed offers because she is on contact precautions for rule out covid however patient did not answer the phone. RN tech will give patient SNF list with bed offers listed. CSW will continue to follow and assist as needed.     Expected Discharge Plan: [place] Barriers to Discharge: Continued Medical Work up  Expected Discharge Plan and Services Expected Discharge Plan: [place]   Discharge Planning Services: CM Consult Post Acute Care Choice: [place] arrangements for the past 2 months: Single Family Home                           HH Arranged: PT, Nurse's Aide, RN [place] Agency: [employer] ([facility]) Date HH Agency Contacted: 04/20/19 Time [place]: [phone] Representative spoke with at [place]: [place] ([place]) Interventions    Readmission Risk Interventions No flowsheet data found.

## 2019-04-21 NOTE — Progress Notes (Addendum)
Awaiting Covid 19 test.  Need results prior to surgery.  Covid test not sent until Sunday, so results not expected until tomorrow. Hope to be able to do procedure tomorrow.

## 2019-04-21 NOTE — Progress Notes (Signed)
[place] at [place] NAME: Veronica Hubbard    MR#:  [id number]  DATE OF BIRTH:  September 06, 1935  SUBJECTIVE:  CHIEF COMPLAINT:   Chief Complaint  Patient presents with  . Back Pain   Patient awaiting results of COVID test prior to kyphoplasty later today. No fevers.  REVIEW OF SYSTEMS:  Review of Systems  Constitutional: Negative for chills and fever.  HENT: Negative for hearing loss and tinnitus.   Eyes: Negative for blurred vision and double vision.  Respiratory: Negative for cough and hemoptysis.   Cardiovascular: Negative for chest pain and palpitations.  Gastrointestinal: Negative for heartburn and nausea.  Genitourinary: Negative for dysuria and urgency.  Musculoskeletal: Negative for myalgias and neck pain.  Skin: Negative for itching and rash.  Neurological: Negative for dizziness and headaches.  Psychiatric/Behavioral: Negative for depression and hallucinations.    DRUG ALLERGIES:   Allergies  Allergen Reactions  . Aspirin Shortness Of Breath  . Celecoxib Shortness Of Breath  . Iodinated Diagnostic Agents Other (See Comments)    Reaction:  Decreased kidney function  . Losartan Itching  . Morphine Nausea And Vomiting  . Verapamil Itching   VITALS:  Blood pressure 129/73, pulse 75, temperature 98.4 F (36.9 C), temperature source Oral, resp. rate 18, height 5\' 3"  (1.6 m), weight 64.6 kg, SpO2 97 %. PHYSICAL EXAMINATION:    Physical Exam  Constitutional: She is oriented to person, place, and time. She appears well-developed.  HENT:  Head: Normocephalic and atraumatic.  Right Ear: External ear normal.  Eyes: Pupils are equal, round, and reactive to light. Conjunctivae are normal. Right eye exhibits no discharge.  Neck: Normal range of motion. Neck supple. No tracheal deviation present.  Cardiovascular: Normal rate, regular rhythm and normal heart sounds.  Respiratory: Effort normal. She has no wheezes.  GI: Soft.  Bowel sounds are normal. She exhibits no distension. There is no abdominal tenderness.  Musculoskeletal: Normal range of motion.        General: No edema.  Neurological: She is alert and oriented to person, place, and time.  Skin: Skin is warm. She is not diaphoretic. No erythema.  Psychiatric: She has a normal mood and affect.   LABORATORY PANEL:  Female CBC Recent Labs  Lab 04/21/19 0341  WBC 19.8*  HGB 12.9  HCT 40.6  PLT 312   ------------------------------------------------------------------------------------------------------------------ Chemistries  Recent Labs  Lab 04/21/19 0341  NA 138  K 3.9  CL 103  CO2 25  GLUCOSE 112*  BUN 15  CREATININE 1.05*  CALCIUM 10.5*  MG 2.0   RADIOLOGY:  No results found. ASSESSMENT AND PLAN:  This is an 84 year old female admitted for back pain.  1.  Back pain: Intractable pain following kyphoplasty done by Dr. Rudene Christians from 04/10/2019. Pains appear better controlled with current regimen. Patient seen by orthopedic service.  Found to have new L4 compression fracture.  Plans for kyphoplasty by Dr. Rudene Christians once could be test available.  Patient currently n.p.o. for possible surgery later today Physical therapy and OT evaluation.  2.  Acute urinary retention. Bladder scan revealed 800 cc previously.  Nursing staff was unable to place Foley catheter.  Urology consultation was placed. Later notified by nursing staff that patient voided about 600 cc.  Patient seen by urologist.  Appreciate input.  No Foley catheter placed at this time since patient appears to be voiding well.  3.    Urinary tract infection ; present on admission. Urine culture growing  Enterococcus faecalis.  Rocephin discontinued and patient started on ampicillin.  4.  Oral thrush: Continue swish and swallow nystatin as well as vitamin C tablets.  5.  Hypertension: Blood pressure better controlled on current regimen   6.  Hypothyroidism:  TSH level elevated at 9.749.    Increased dose of Synthroid from 112 to 150 mcg p.o. daily.  Repeat thyroid function test by primary care physician as outpatient  7.  Asthma: Continue long-acting bronchial agonist as well as albuterol  8.  Depression: Continue Celexa    DVT prophylaxis: Lovenox   All the records are reviewed and case discussed with Care Management/Social Worker. Management plans discussed with the patient, family and they are in agreement.  CODE STATUS: Full Code  TOTAL TIME TAKING CARE OF THIS PATIENT: 34 minutes.   More than 50% of the time was spent in counseling/coordination of care: YES  POSSIBLE D/C IN 2 DAYS, DEPENDING ON CLINICAL CONDITION.   Wrenley Sayed M.D on 04/21/2019 at 2:42 PM  Between 7am to 6pm - Pager - [phone]  After 6pm go to [email or web address] - Proofreader  [facility]  Office  [phone]  CC: Primary care physician; Zhou-Talbert, Elwyn Lade, MD  Note: This dictation was prepared with Dragon dictation along with smaller phrase technology. Any transcriptional errors that result from this process are unintentional.

## 2019-04-21 NOTE — Progress Notes (Signed)
PT Cancellation Note  Patient Details Name: Veronica Hubbard MRN: [id number] DOB: Mar 13, 1935   Cancelled Treatment:    Reason Eval/Treat Not Completed: Other (comment). Plan for pt to undergo kyphoplasty this date. Will hold therapy and resume when medically stable.   Edyn Qazi 04/21/2019, 12:02 PM  Greggory Stallion, PT, DPT [phone]

## 2019-04-22 ENCOUNTER — Inpatient Hospital Stay: Payer: Medicare HMO

## 2019-04-22 ENCOUNTER — Inpatient Hospital Stay: Payer: Medicare HMO | Admitting: Anesthesiology

## 2019-04-22 ENCOUNTER — Encounter
Admission: EM | Disposition: A | Discharge status: Skilled Nursing Facility | Payer: Self-pay | Source: Home / Self Care | Attending: Internal Medicine

## 2019-04-22 HISTORY — PX: KYPHOPLASTY: SHX5884

## 2019-04-22 LAB — CBC
HCT: 42.6 % (ref 36.0–46.0)
Hemoglobin: 13.2 g/dL (ref 12.0–15.0)
MCH: 27.2 pg (ref 26.0–34.0)
MCHC: 31 g/dL (ref 30.0–36.0)
MCV: 87.7 fL (ref 80.0–100.0)
Platelets: 318 10*3/uL (ref 150–400)
RBC: 4.86 MIL/uL (ref 3.87–5.11)
RDW: 20.5 % — ABNORMAL HIGH (ref 11.5–15.5)
WBC: 20 10*3/uL — ABNORMAL HIGH (ref 4.0–10.5)
nRBC: 0.2 % (ref 0.0–0.2)

## 2019-04-22 LAB — BASIC METABOLIC PANEL
Anion gap: 8 (ref 5–15)
BUN: 16 mg/dL (ref 8–23)
CO2: 28 mmol/L (ref 22–32)
Calcium: 10.6 mg/dL — ABNORMAL HIGH (ref 8.9–10.3)
Chloride: 104 mmol/L (ref 98–111)
Creatinine, Ser: 1.27 mg/dL — ABNORMAL HIGH (ref 0.44–1.00)
GFR calc Af Amer: 46 mL/min — ABNORMAL LOW (ref >60)
GFR calc non Af Amer: 39 mL/min — ABNORMAL LOW (ref >60)
Glucose, Bld: 110 mg/dL — ABNORMAL HIGH (ref 70–99)
Potassium: 3.8 mmol/L (ref 3.5–5.1)
Sodium: 140 mmol/L (ref 135–145)

## 2019-04-22 LAB — MAGNESIUM: Magnesium: 2.1 mg/dL (ref 1.7–2.4)

## 2019-04-22 SURGERY — KYPHOPLASTY
Anesthesia: Monitor Anesthesia Care | Site: Back

## 2019-04-22 MED ORDER — PROPOFOL 500 MG/50ML IV EMUL
INTRAVENOUS | Status: DC | PRN
  Administered 2019-04-22: 30 ug/kg/min via INTRAVENOUS

## 2019-04-22 MED ORDER — CEFAZOLIN SODIUM-DEXTROSE 1-4 GM/50ML-% IV SOLN
1.0000 g | Freq: Four times a day (QID) | INTRAVENOUS | Status: AC
  Administered 2019-04-22 – 2019-04-23 (×2): 1 g via INTRAVENOUS
  Filled 2019-04-22 (×2): qty 50

## 2019-04-22 MED ORDER — FLUTICASONE PROPIONATE 50 MCG/ACT NA SUSP
1.0000 | Freq: Two times a day (BID) | NASAL | Status: DC
  Administered 2019-04-23 – 2019-04-27 (×8): 1 via NASAL
  Filled 2019-04-22: qty 16

## 2019-04-22 MED ORDER — MELATONIN 5 MG PO TABS
5.0000 mg | ORAL_TABLET | Freq: Every day | ORAL | Status: DC
  Administered 2019-04-22 – 2019-04-26 (×5): 5 mg via ORAL
  Filled 2019-04-22 (×6): qty 1

## 2019-04-22 MED ORDER — ESMOLOL HCL 100 MG/10ML IV SOLN
INTRAVENOUS | Status: DC | PRN
  Administered 2019-04-22 (×2): 20 mg via INTRAVENOUS

## 2019-04-22 MED ORDER — OXYCODONE HCL 5 MG PO TABS
5.0000 mg | ORAL_TABLET | Freq: Once | ORAL | Status: AC
  Administered 2019-04-22: 5 mg via ORAL

## 2019-04-22 MED ORDER — KETAMINE HCL 100 MG/ML IJ SOLN
INTRAMUSCULAR | Status: DC | PRN
  Administered 2019-04-22: 100 mg via INTRAVENOUS

## 2019-04-22 MED ORDER — KETAMINE HCL 50 MG/ML IJ SOLN
INTRAMUSCULAR | Status: AC
  Filled 2019-04-22: qty 10

## 2019-04-22 MED ORDER — ALLOPURINOL 100 MG PO TABS
200.0000 mg | ORAL_TABLET | Freq: Every day | ORAL | Status: DC
  Administered 2019-04-23 – 2019-04-27 (×5): 200 mg via ORAL
  Filled 2019-04-22 (×6): qty 2

## 2019-04-22 MED ORDER — ONDANSETRON 4 MG PO TBDP
4.0000 mg | ORAL_TABLET | Freq: Three times a day (TID) | ORAL | Status: DC | PRN
  Filled 2019-04-22: qty 1

## 2019-04-22 MED ORDER — PROPOFOL 10 MG/ML IV BOLUS
INTRAVENOUS | Status: DC | PRN
  Administered 2019-04-22: 20 mg via INTRAVENOUS

## 2019-04-22 MED ORDER — EZETIMIBE 10 MG PO TABS
10.0000 mg | ORAL_TABLET | Freq: Every day | ORAL | Status: DC
  Administered 2019-04-22 – 2019-04-26 (×5): 10 mg via ORAL
  Filled 2019-04-22 (×5): qty 1

## 2019-04-22 MED ORDER — LABETALOL HCL 5 MG/ML IV SOLN
INTRAVENOUS | Status: AC
  Filled 2019-04-22: qty 4

## 2019-04-22 MED ORDER — LIDOCAINE HCL (PF) 2 % IJ SOLN
INTRAMUSCULAR | Status: AC
  Filled 2019-04-22: qty 10

## 2019-04-22 MED ORDER — LORAZEPAM 2 MG/ML IJ SOLN
0.5000 mg | Freq: Once | INTRAMUSCULAR | Status: AC
  Administered 2019-04-22: 0.5 mg via INTRAVENOUS
  Filled 2019-04-22: qty 1

## 2019-04-22 MED ORDER — CEFAZOLIN SODIUM-DEXTROSE 2-3 GM-%(50ML) IV SOLR
INTRAVENOUS | Status: DC | PRN
  Administered 2019-04-22: 2 g via INTRAVENOUS

## 2019-04-22 MED ORDER — PANTOPRAZOLE SODIUM 40 MG PO TBEC
40.0000 mg | DELAYED_RELEASE_TABLET | Freq: Two times a day (BID) | ORAL | Status: DC
  Administered 2019-04-22 – 2019-04-27 (×10): 40 mg via ORAL
  Filled 2019-04-22 (×10): qty 1

## 2019-04-22 MED ORDER — BUPIVACAINE-EPINEPHRINE (PF) 0.5% -1:200000 IJ SOLN
INTRAMUSCULAR | Status: DC | PRN
  Administered 2019-04-22: 20 mL

## 2019-04-22 MED ORDER — BUDESONIDE 0.5 MG/2ML IN SUSP
0.5000 mg | Freq: Two times a day (BID) | RESPIRATORY_TRACT | Status: DC
  Administered 2019-04-22: 0.5 mg via RESPIRATORY_TRACT
  Filled 2019-04-22: qty 2

## 2019-04-22 MED ORDER — ESMOLOL HCL 100 MG/10ML IV SOLN
INTRAVENOUS | Status: AC
  Filled 2019-04-22: qty 10

## 2019-04-22 MED ORDER — LIDOCAINE HCL (PF) 1 % IJ SOLN
INTRAMUSCULAR | Status: AC
  Filled 2019-04-22: qty 30

## 2019-04-22 MED ORDER — METOCLOPRAMIDE HCL 5 MG/ML IJ SOLN: 5.0000 mg | Freq: Three times a day (TID) | INTRAMUSCULAR | Status: DC | PRN

## 2019-04-22 MED ORDER — DOCUSATE SODIUM 100 MG PO CAPS: 100.0000 mg | ORAL_CAPSULE | Freq: Two times a day (BID) | ORAL | Status: DC

## 2019-04-22 MED ORDER — METOCLOPRAMIDE HCL 10 MG PO TABS: 5.0000 mg | ORAL_TABLET | Freq: Three times a day (TID) | ORAL | Status: DC | PRN

## 2019-04-22 MED ORDER — PROPOFOL 10 MG/ML IV BOLUS
INTRAVENOUS | Status: AC
  Filled 2019-04-22: qty 20

## 2019-04-22 MED ORDER — MIDAZOLAM HCL 2 MG/2ML IJ SOLN
INTRAMUSCULAR | Status: AC
  Filled 2019-04-22: qty 2

## 2019-04-22 MED ORDER — CEFAZOLIN SODIUM-DEXTROSE 1-4 GM/50ML-% IV SOLN
1.0000 g | Freq: Four times a day (QID) | INTRAVENOUS | Status: DC
  Filled 2019-04-22 (×3): qty 50

## 2019-04-22 MED ORDER — IOPAMIDOL (ISOVUE-M 200) INJECTION 41%
INTRAMUSCULAR | Status: DC | PRN
  Administered 2019-04-22: 20 mL

## 2019-04-22 MED ORDER — MIDAZOLAM HCL 2 MG/2ML IJ SOLN
INTRAMUSCULAR | Status: DC | PRN
  Administered 2019-04-22 (×2): 1 mg via INTRAVENOUS

## 2019-04-22 MED ORDER — FENTANYL CITRATE (PF) 100 MCG/2ML IJ SOLN
INTRAMUSCULAR | Status: AC
  Filled 2019-04-22: qty 2

## 2019-04-22 MED ORDER — HYDROMORPHONE HCL 1 MG/ML IJ SOLN
0.5000 mg | INTRAMUSCULAR | Status: DC | PRN
  Administered 2019-04-22 (×2): 0.5 mg via INTRAVENOUS

## 2019-04-22 MED ORDER — FENTANYL CITRATE (PF) 100 MCG/2ML IJ SOLN
25.0000 ug | INTRAMUSCULAR | Status: DC | PRN
  Administered 2019-04-22 (×4): 25 ug via INTRAVENOUS

## 2019-04-22 MED ORDER — LABETALOL HCL 5 MG/ML IV SOLN
INTRAVENOUS | Status: DC | PRN
  Administered 2019-04-22: 5 mg via INTRAVENOUS

## 2019-04-22 MED ORDER — CYCLOBENZAPRINE HCL 10 MG PO TABS
5.0000 mg | ORAL_TABLET | Freq: Every evening | ORAL | Status: DC | PRN
  Administered 2019-04-26: 10 mg via ORAL
  Filled 2019-04-22: qty 1

## 2019-04-22 MED ORDER — HYDROMORPHONE HCL 1 MG/ML IJ SOLN
INTRAMUSCULAR | Status: AC
  Administered 2019-04-22: 0.5 mg via INTRAVENOUS
  Filled 2019-04-22: qty 1

## 2019-04-22 MED ORDER — FENTANYL CITRATE (PF) 100 MCG/2ML IJ SOLN
INTRAMUSCULAR | Status: AC
  Administered 2019-04-22: 25 ug via INTRAVENOUS
  Filled 2019-04-22: qty 2

## 2019-04-22 MED ORDER — LIDOCAINE HCL 1 % IJ SOLN
INTRAMUSCULAR | Status: DC | PRN
  Administered 2019-04-22: 20 mL

## 2019-04-22 MED ORDER — ONDANSETRON HCL 4 MG/2ML IJ SOLN: 4.0000 mg | Freq: Once | INTRAMUSCULAR | Status: DC | PRN

## 2019-04-22 MED ORDER — CEFAZOLIN SODIUM-DEXTROSE 1-4 GM/50ML-% IV SOLN
INTRAVENOUS | Status: AC
  Filled 2019-04-22: qty 50

## 2019-04-22 MED ORDER — LACTATED RINGERS IV SOLN
INTRAVENOUS | Status: DC
  Administered 2019-04-22 – 2019-04-27 (×6): via INTRAVENOUS

## 2019-04-22 MED ORDER — OXYCODONE HCL 5 MG PO TABS
ORAL_TABLET | ORAL | Status: AC
  Filled 2019-04-22: qty 1

## 2019-04-22 SURGICAL SUPPLY — 20 items

## 2019-04-22 NOTE — Progress Notes (Signed)
Pt in PACU c/o 7 out of 10 back pain. 25mg  x 4 fentanyl given. BP elevated. Oxycodone 7.5mg  IR given today at 10:56am.  Spoke with Dr. Amie Critchley who ordered 5mg  IR oxycodone to be given now.

## 2019-04-22 NOTE — Transfer of Care (Signed)
Immediate Anesthesia Transfer of Care Note  Patient: Veronica Hubbard  Procedure(s) Performed: KYPHOPLASTY (N/A Back)  Patient Location: PACU  Anesthesia Type:MAC  Level of Consciousness: sedated  Airway & Oxygen Therapy: Patient Spontanous Breathing and Patient connected to nasal cannula oxygen  Post-op Assessment: Report given to RN and Post -op Vital signs reviewed and stable  Post vital signs: Reviewed and stable  Last Vitals:  Vitals Value Taken Time  BP 153/88 04/22/19 1609  Temp 36.4 C 04/22/19 1609  Pulse 67 04/22/19 1613  Resp 14 04/22/19 1613  SpO2 98 % 04/22/19 1613  Vitals shown include unvalidated device data.  Last Pain:  Vitals:   04/22/19 1409  TempSrc:   PainSc: 0-No pain      Patients Stated Pain Goal: 1 (23/76/28 3151)  Complications: No apparent anesthesia complications

## 2019-04-22 NOTE — Anesthesia Preprocedure Evaluation (Signed)
Anesthesia Evaluation  Patient identified by MRN, date of birth, ID band Patient awake    Reviewed: Allergy & Precautions, H&P , NPO status , Patient's Chart, lab work & pertinent test results, reviewed documented beta blocker date and time   Airway Mallampati: II  TM Distance: >3 FB Neck ROM: full    Dental no notable dental hx. (+) Teeth Intact   Pulmonary asthma , former smoker,    Pulmonary exam normal breath sounds clear to auscultation       Cardiovascular Exercise Tolerance: Good hypertension, On Medications negative cardio ROS   Rhythm:regular Rate:Normal     Neuro/Psych PSYCHIATRIC DISORDERS Depression negative neurological ROS     GI/Hepatic Neg liver ROS, GERD  Medicated,  Endo/Other  diabetesHypothyroidism   Renal/GU      Musculoskeletal   Abdominal   Peds  Hematology negative hematology ROS (+)   Anesthesia Other Findings   Reproductive/Obstetrics negative OB ROS                             Anesthesia Physical Anesthesia Plan  ASA: III  Anesthesia Plan: MAC   Post-op Pain Management:    Induction:   PONV Risk Score and Plan:   Airway Management Planned:   Additional Equipment:   Intra-op Plan:   Post-operative Plan:   Informed Consent: I have reviewed the patients History and Physical, chart, labs and discussed the procedure including the risks, benefits and alternatives for the proposed anesthesia with the patient or authorized representative who has indicated his/her understanding and acceptance.       Plan Discussed with: CRNA  Anesthesia Plan Comments:         Anesthesia Quick Evaluation

## 2019-04-22 NOTE — Progress Notes (Signed)
[place] [facility] [place] NAME: Sonnet Rizor    MR#:  [id number]  DATE OF BIRTH:  02-Sep-1935  SUBJECTIVE:  CHIEF COMPLAINT:   Chief Complaint  Patient presents with   Back Pain    This morning patient was very anxious while awaiting surgery.  To be given a dose of 0.5 mg of Ativan.  Denies any shortness of breath.  REVIEW OF SYSTEMS:  Review of Systems  Constitutional: Negative for chills and fever.  HENT: Negative for hearing loss and tinnitus.   Eyes: Negative for blurred vision and double vision.  Respiratory: Negative for cough and hemoptysis.   Cardiovascular: Negative for chest pain and palpitations.  Gastrointestinal: Negative for heartburn and nausea.  Genitourinary: Negative for dysuria and urgency.  Musculoskeletal: Negative for myalgias and neck pain.  Skin: Negative for itching and rash.  Neurological: Negative for dizziness and headaches.  Psychiatric/Behavioral: Negative for depression and hallucinations.    DRUG ALLERGIES:   Allergies  Allergen Reactions   Aspirin Shortness Of Breath   Celecoxib Shortness Of Breath   Iodinated Diagnostic Agents Other (See Comments)    Reaction:  Decreased kidney function   Losartan Itching   Morphine Nausea And Vomiting   Verapamil Itching   VITALS:  Blood pressure (!) 154/77, pulse 92, temperature (!) 97.5 F (36.4 C), resp. rate 16, height 5\' 3"  (1.6 m), weight 64.6 kg, SpO2 97 %. PHYSICAL EXAMINATION:    Physical Exam  Constitutional: She is oriented to person, place, and time. She appears well-developed.  HENT:  Head: Normocephalic and atraumatic.  Right Ear: External ear normal.  Eyes: Pupils are equal, round, and reactive to light. Conjunctivae are normal. Right eye exhibits no discharge.  Neck: Normal range of motion. Neck supple. No tracheal deviation present.  Cardiovascular: Normal rate, regular rhythm and normal heart sounds.  Respiratory: Effort  normal. She has no wheezes.  GI: Soft. Bowel sounds are normal. She exhibits no distension. There is no abdominal tenderness.  Musculoskeletal: Normal range of motion.        General: No edema.  Neurological: She is alert and oriented to person, place, and time.  Skin: Skin is warm. She is not diaphoretic. No erythema.  Psychiatric: She has a normal mood and affect.   LABORATORY PANEL:  Female CBC Recent Labs  Lab 04/22/19 0321  WBC 20.0*  HGB 13.2  HCT 42.6  PLT 318   ------------------------------------------------------------------------------------------------------------------ Chemistries  Recent Labs  Lab 04/22/19 0321  NA 140  K 3.8  CL 104  CO2 28  GLUCOSE 110*  BUN 16  CREATININE 1.27*  CALCIUM 10.6*  MG 2.1   RADIOLOGY:  Dg Chest Port 1 View  Result Date: 04/22/2019 CLINICAL DATA:  Shortness of breath. EXAM: PORTABLE CHEST 1 VIEW COMPARISON:  Chest x-ray dated 02/23/2016 and chest CT dated 06/07/2018 and CT scan of the abdomen dated 04/19/2019 FINDINGS: There is tortuosity and aneurysmal dilatation of the descending thoracic aorta as demonstrated on multiple prior exams. Overall heart size is normal. Slight linear atelectasis at the right lung base laterally. No infiltrates. No acute bone abnormality. Aortic atherosclerosis. IMPRESSION: 1. Slight atelectasis at the right lung base. This is essentially unchanged since the prior CT scan of 04/19/2019. 2. Chronic aneurysmal dilatation of descending thoracic aorta. 3.  Aortic Atherosclerosis (ICD10-I70.0). Electronically Signed   By: Lorriane Shire M.D.   On: 04/22/2019 12:47   ASSESSMENT AND PLAN:  This is an 84 year old female admitted for  back pain.  1.  Back pain: Intractable pain following kyphoplasty done by Dr. Rudene Christians from 04/10/2019. Pains appear better controlled with current regimen. Patient seen by orthopedic service.  Found to have new L4 compression fracture.  Plans for kyphoplasty by Dr. Rudene Christians today    physical therapy and OT evaluation.  2.  Acute urinary retention. Bladder scan revealed 800 cc previously.  Nursing staff was unable to place Foley catheter.  Urology consultation was placed. Later notified by nursing staff that patient voided about 600 cc.  Patient seen by urologist.  Appreciate input.  No Foley catheter placed at this time since patient appears to be voiding well.  3.    Urinary tract infection ; present on admission. Urine culture growing Enterococcus faecalis.  Rocephin discontinued and patient started on ampicillin.  4.  Oral thrush: Continue swish and swallow nystatin as well as vitamin C tablets.  5.  Hypertension: Blood pressure better controlled on current regimen   6.  Hypothyroidism:  TSH level elevated at 9.749.   Increased dose of Synthroid from 112 to 150 mcg p.o. daily.  Repeat thyroid function test by primary care physician as outpatient  7.  Asthma: Continue long-acting bronchial agonist as well as albuterol  8.  Depression: Continue Celexa    DVT prophylaxis: Lovenox   All the records are reviewed and case discussed with Care Management/Social Worker. Management plans discussed with the patient, family and they are in agreement.  CODE STATUS: Full Code  TOTAL TIME TAKING CARE OF THIS PATIENT: 35 minutes.   More than 50% of the time was spent in counseling/coordination of care: YES  POSSIBLE D/C IN 2 DAYS, DEPENDING ON CLINICAL CONDITION.   Edyn Popoca M.D on 04/22/2019 at 3:04 PM  Between 7am to 6pm - Pager - [phone]  After 6pm go to [email or web address] - Proofreader  [facility]  Office  [phone]  CC: Primary care physician; Zhou-Talbert, Elwyn Lade, MD  Note: This dictation was prepared with Dragon dictation along with smaller phrase technology. Any transcriptional errors that result from this process are unintentional.

## 2019-04-22 NOTE — Progress Notes (Signed)
Covid testing negative, continued pain. Plan kypho today

## 2019-04-22 NOTE — Progress Notes (Signed)
PT extremely anxious, c/o of difficulty breathing. Respirations assessed with expiratory wheezing , shallow labored breathing. 2L [place] applied, O2 sats 99%, Albuterol nebulizer treatment given. Pt reported treatment was not effect. The Hospitalist was notified. 0.5mg  Lorazepam ordered and administered. No labored or shallow breathing noted after. Pt sleeping quietly.

## 2019-04-22 NOTE — Progress Notes (Signed)
PT Cancellation Note  Patient Details Name: Veronica Hubbard MRN: [id number] DOB: [birth year]   Cancelled Treatment:    Reason Eval/Treat Not Completed: Other (comment)(Pt off the floor at this time for kyphoplasty. PT to follow up as able with physician clearance.)   Lieutenant Diego PT, DPT 2:31 PM,04/22/19 [phone]

## 2019-04-22 NOTE — Anesthesia Procedure Notes (Signed)
Date/Time: 04/22/2019 3:45 PM Performed by: Caryl Asp, CRNA Pre-anesthesia Checklist: Patient identified, Emergency Drugs available, Suction available, Patient being monitored and Timeout performed Oxygen Delivery Method: Nasal cannula

## 2019-04-22 NOTE — Op Note (Signed)
Date April 22, 2019  time 4:11 PM   PATIENT:  Veronica Hubbard   PRE-OPERATIVE DIAGNOSIS:  closed wedge compression fracture of L4 with persistent edema at L3 from prior kyphoplasty   POST-OPERATIVE DIAGNOSIS:  same   PROCEDURE:  Procedure(s): KYPHOPLASTY L4 kyphoplasty and repeat kyphoplasty at L3  SURGEON: Laurene Footman, MD   ASSISTANTS: None   ANESTHESIA:   local and MAC   EBL:  No intake/output data recorded.   BLOOD ADMINISTERED:none   DRAINS: none    LOCAL MEDICATIONS USED:  MARCAINE    and XYLOCAINE    SPECIMEN:   None   DISPOSITION OF SPECIMEN:  Not applicable   COUNTS:  YES   TOURNIQUET:  * No tourniquets in log *   IMPLANTS: Bone cement   DICTATION: .Dragon Dictation  patient was brought to the operating room and after adequate anesthesia was obtained the patient was placed prone.  C arm was brought in in good visualization of the affected level obtained on both AP and lateral projections.  After patient identification and timeout procedures were completed, local anesthetic was infiltrated with 10 cc 1% Xylocaine infiltrated subcutaneously.  This is done the area on the right side of the planned approach.  The back was then prepped and draped in the usual sterile manner and repeat timeout procedure carried out.  A spinal needle was brought down to the pedicle on the right side of  L4 and a 50-50 mix of 1% Xylocaine half percent Sensorcaine with epinephrine total of 20 cc injected.  Same procedure carried out on the left at L3 at the left side pedicle After allowing this to set a small incision was made and the trocar was advanced into the vertebral body in an extrapedicular fashion.  Biopsy was attempted but not obtained Drilling was carried out balloon inserted with inflation to  5 cc.  When the cement was appropriate consistency 6 cc were injected into the vertebral body at L4 without extravasation, good fill superior to inferior endplates and from right to left  sides along the inferior endplate.  At L3 a transpedicular approach was utilized drilling carried out balloon inflated to about 2 cc an additional 3 cc infiltrated into the left side superior aspect of the pedicle.  There was a fracture line that extended to the disc space and a small amount of cement did get into the L2-3 disc space After the cement had set the trochar was removed and permanent C-arm views obtained.  The wound was closed with Dermabond followed by [place]:  Continued observation   PATIENT DISPOSITION:  PACU - hemodynamically stable.

## 2019-04-22 NOTE — Anesthesia Post-op Follow-up Note (Signed)
Anesthesia QCDR form completed.        

## 2019-04-22 NOTE — Anesthesia Postprocedure Evaluation (Signed)
Anesthesia Post Note  Patient: Gunhild Bautch  Procedure(s) Performed: KYPHOPLASTY (N/A Back)  Patient location during evaluation: PACU Anesthesia Type: MAC Level of consciousness: awake and alert Pain management: pain level controlled Vital Signs Assessment: post-procedure vital signs reviewed and stable Respiratory status: spontaneous breathing, nonlabored ventilation, respiratory function stable and patient connected to nasal cannula oxygen Cardiovascular status: blood pressure returned to baseline and stable Postop Assessment: no apparent nausea or vomiting Anesthetic complications: no     Last Vitals:  Vitals:   04/22/19 1736 04/22/19 1755  BP:  (!) 159/64  Pulse: 72 72  Resp: 15 18  Temp:  36.9 C  SpO2: 97% (!) 89%    Last Pain:  Vitals:   04/22/19 1755  TempSrc: Oral  PainSc:                  Precious Haws Piscitello

## 2019-04-23 ENCOUNTER — Encounter: Payer: Self-pay | Admitting: Orthopedic Surgery

## 2019-04-23 LAB — CBC
HCT: 38.3 % (ref 36.0–46.0)
Hemoglobin: 12.2 g/dL (ref 12.0–15.0)
MCH: 27.2 pg (ref 26.0–34.0)
MCHC: 31.9 g/dL (ref 30.0–36.0)
MCV: 85.3 fL (ref 80.0–100.0)
Platelets: 288 10*3/uL (ref 150–400)
RBC: 4.49 MIL/uL (ref 3.87–5.11)
RDW: 20.3 % — ABNORMAL HIGH (ref 11.5–15.5)
WBC: 17.6 10*3/uL — ABNORMAL HIGH (ref 4.0–10.5)
nRBC: 0.1 % (ref 0.0–0.2)

## 2019-04-23 LAB — BASIC METABOLIC PANEL
Anion gap: 8 (ref 5–15)
BUN: 12 mg/dL (ref 8–23)
CO2: 25 mmol/L (ref 22–32)
Calcium: 10.1 mg/dL (ref 8.9–10.3)
Chloride: 105 mmol/L (ref 98–111)
Creatinine, Ser: 1.03 mg/dL — ABNORMAL HIGH (ref 0.44–1.00)
GFR calc Af Amer: 59 mL/min — ABNORMAL LOW (ref >60)
GFR calc non Af Amer: 51 mL/min — ABNORMAL LOW (ref >60)
Glucose, Bld: 110 mg/dL — ABNORMAL HIGH (ref 70–99)
Potassium: 3.5 mmol/L (ref 3.5–5.1)
Sodium: 138 mmol/L (ref 135–145)

## 2019-04-23 LAB — MAGNESIUM: Magnesium: 1.8 mg/dL (ref 1.7–2.4)

## 2019-04-23 MED ORDER — ENOXAPARIN SODIUM 40 MG/0.4ML ~~LOC~~ SOLN
40.0000 mg | SUBCUTANEOUS | Status: DC
  Administered 2019-04-23 – 2019-04-26 (×4): 40 mg via SUBCUTANEOUS
  Filled 2019-04-23 (×4): qty 0.4

## 2019-04-23 MED ORDER — METHYLPREDNISOLONE SODIUM SUCC 40 MG IJ SOLR
40.0000 mg | Freq: Once | INTRAMUSCULAR | Status: AC
  Administered 2019-04-23: 40 mg via INTRAVENOUS
  Filled 2019-04-23: qty 1

## 2019-04-23 MED ORDER — SCOPOLAMINE 1 MG/3DAYS TD PT72
1.0000 | MEDICATED_PATCH | TRANSDERMAL | Status: DC
  Administered 2019-04-23 – 2019-04-26 (×2): 1.5 mg via TRANSDERMAL
  Filled 2019-04-23 (×2): qty 1

## 2019-04-23 MED ORDER — AMOXICILLIN 500 MG PO CAPS
500.0000 mg | ORAL_CAPSULE | Freq: Three times a day (TID) | ORAL | Status: AC
  Administered 2019-04-23 – 2019-04-25 (×6): 500 mg via ORAL
  Filled 2019-04-23 (×6): qty 1

## 2019-04-23 MED ORDER — POLYETHYLENE GLYCOL 3350 17 G PO PACK
17.0000 g | PACK | Freq: Every day | ORAL | Status: DC
  Administered 2019-04-23 – 2019-04-26 (×3): 17 g via ORAL
  Filled 2019-04-23 (×3): qty 1

## 2019-04-23 MED ORDER — OXYCODONE HCL 5 MG PO TABS
7.5000 mg | ORAL_TABLET | Freq: Four times a day (QID) | ORAL | Status: DC | PRN
  Administered 2019-04-23 – 2019-04-27 (×9): 7.5 mg via ORAL
  Filled 2019-04-23 (×10): qty 2

## 2019-04-23 NOTE — Progress Notes (Signed)
[facility] at [place] NAME: Celina Shiley    MR#:  [id number]  DATE OF BIRTH:  1935/06/08  SUBJECTIVE:  CHIEF COMPLAINT:   Chief Complaint  Patient presents with  . Back Pain   No new complaint this morning.  Patient status post kyphoplasty yesterday.  No shortness of breath.  No chest pain.  Some mild back discomfort.  REVIEW OF SYSTEMS:  Review of Systems  Constitutional: Negative for chills and fever.  HENT: Negative for hearing loss and tinnitus.   Eyes: Negative for blurred vision and double vision.  Respiratory: Negative for cough and hemoptysis.   Cardiovascular: Negative for chest pain and palpitations.  Gastrointestinal: Negative for heartburn and nausea.  Genitourinary: Negative for dysuria and urgency.  Musculoskeletal: Negative for myalgias and neck pain.  Skin: Negative for itching and rash.  Neurological: Negative for dizziness and headaches.  Psychiatric/Behavioral: Negative for depression and hallucinations.    DRUG ALLERGIES:   Allergies  Allergen Reactions  . Aspirin Shortness Of Breath  . Celecoxib Shortness Of Breath  . Iodinated Diagnostic Agents Other (See Comments)    Reaction:  Decreased kidney function  . Losartan Itching  . Morphine Nausea And Vomiting  . Verapamil Itching   VITALS:  Blood pressure (!) 147/49, pulse 64, temperature 98.4 F (36.9 C), resp. rate 18, height 5\' 3"  (1.6 m), weight 60.6 kg, SpO2 100 %. PHYSICAL EXAMINATION:    Physical Exam  Constitutional: She is oriented to person, place, and time. She appears well-developed.  HENT:  Head: Normocephalic and atraumatic.  Right Ear: External ear normal.  Eyes: Pupils are equal, round, and reactive to light. Conjunctivae are normal. Right eye exhibits no discharge.  Neck: Normal range of motion. Neck supple. No tracheal deviation present.  Cardiovascular: Normal rate, regular rhythm and normal heart sounds.  Respiratory: Effort  normal. She has no wheezes.  GI: Soft. Bowel sounds are normal. She exhibits no distension. There is no abdominal tenderness.  Musculoskeletal: Normal range of motion.        General: No edema.  Neurological: She is alert and oriented to person, place, and time.  Skin: Skin is warm. She is not diaphoretic. No erythema.  Psychiatric: She has a normal mood and affect.   LABORATORY PANEL:  Female CBC Recent Labs  Lab 04/23/19 0455  WBC 17.6*  HGB 12.2  HCT 38.3  PLT 288   ------------------------------------------------------------------------------------------------------------------ Chemistries  Recent Labs  Lab 04/23/19 0455  NA 138  K 3.5  CL 105  CO2 25  GLUCOSE 110*  BUN 12  CREATININE 1.03*  CALCIUM 10.1  MG 1.8   RADIOLOGY:  Dg Lumbar Spine 2-3 Views  Result Date: 04/22/2019 CLINICAL DATA:  Vertebral augmentation EXAM: DG C-ARM 61-120 MIN; LUMBAR SPINE - 2-3 VIEW COMPARISON:  None. FINDINGS: Four fluoroscopic images are providing showing vertebral augmentation at the lumbar spine. IMPRESSION: Intraoperative fluoroscopy Electronically Signed   By: Ulyses Jarred M.D.   On: 04/22/2019 19:39   Dg Chest Port 1 View  Result Date: 04/22/2019 CLINICAL DATA:  Shortness of breath. EXAM: PORTABLE CHEST 1 VIEW COMPARISON:  Chest x-ray dated 02/23/2016 and chest CT dated 06/07/2018 and CT scan of the abdomen dated 04/19/2019 FINDINGS: There is tortuosity and aneurysmal dilatation of the descending thoracic aorta as demonstrated on multiple prior exams. Overall heart size is normal. Slight linear atelectasis at the right lung base laterally. No infiltrates. No acute bone abnormality. Aortic atherosclerosis. IMPRESSION: 1. Slight atelectasis  at the right lung base. This is essentially unchanged since the prior CT scan of 04/19/2019. 2. Chronic aneurysmal dilatation of descending thoracic aorta. 3.  Aortic Atherosclerosis (ICD10-I70.0). Electronically Signed   By: Lorriane Shire M.D.    On: 04/22/2019 12:47   Dg C-arm 1-60 Min  Result Date: 04/22/2019 CLINICAL DATA:  Vertebral augmentation EXAM: DG C-ARM 61-120 MIN; LUMBAR SPINE - 2-3 VIEW COMPARISON:  None. FINDINGS: Four fluoroscopic images are providing showing vertebral augmentation at the lumbar spine. IMPRESSION: Intraoperative fluoroscopy Electronically Signed   By: Ulyses Jarred M.D.   On: 04/22/2019 19:39   ASSESSMENT AND PLAN:  This is an 84 year old female admitted for back pain.  1.  Back pain: Intractable pain following kyphoplasty done by Dr. Rudene Christians from 04/10/2019. Pains appear better controlled with current regimen. Patient seen by orthopedic service.  Found to have new L4 compression fracture.  Patient had kyphoplasty done on 04/22/2019 by Dr. Rudene Christians today  Follow-up on physical therapy evaluation and recommendation.  2.  Acute urinary retention. Bladder scan revealed 800 cc previously.  Nursing staff was unable to place Foley catheter.  Urology consultation was placed. Later notified by nursing staff that patient voided about 600 cc.  Patient seen by urologist.  Appreciate input.  No Foley catheter placed at this time since patient appears to be voiding well.  3.    Urinary tract infection ; present on admission. Urine culture growing Enterococcus faecalis.  Rocephin discontinued and patient started on ampicillin.  4.  Oral thrush: Continue swish and swallow nystatin as well as vitamin C tablets.  5.  Hypertension: Blood pressure better controlled on current regimen   6.  Hypothyroidism:  TSH level elevated at 9.749.   Increased dose of Synthroid from 112 to 150 mcg p.o. daily.  Repeat thyroid function test by primary care physician as outpatient  7.  Asthma: Continue long-acting bronchial agonist as well as albuterol  8.  Depression: Continue Celexa    DVT prophylaxis: Lovenox   All the records are reviewed and case discussed with Care Management/Social Worker. Management plans discussed with  the patient, family and they are in agreement.  CODE STATUS: Full Code  TOTAL TIME TAKING CARE OF THIS PATIENT: 34 minutes.   More than 50% of the time was spent in counseling/coordination of care: YES  POSSIBLE D/C IN 1-2 DAYS, DEPENDING ON CLINICAL CONDITION.   Moosa Bueche M.D on 04/23/2019 at 10:18 AM  Between 7am to 6pm - Pager - [phone]  After 6pm go to [email or web address] - Proofreader  [facility]  Office  [phone]  CC: Primary care physician; Zhou-Talbert, Elwyn Lade, MD  Note: This dictation was prepared with Dragon dictation along with smaller phrase technology. Any transcriptional errors that result from this process are unintentional.

## 2019-04-23 NOTE — TOC Progression Note (Signed)
Transition of Care [facility]) - Progression Note    Patient Details  Name: Celsa Nordahl MRN: [id number] Date of Birth: 04-16-1935  Transition of Care [facility]) CM/SW Contact  Su Hilt, RN Phone Number: 04/23/2019, 8:37 AM  Clinical Narrative:     Damaris Schooner to Otila Kluver at [place], She assigned Room number 807 to the patient for [place], [phone] is the number to call for report, Auth received started 6/16  Expected Discharge Plan: [place] Barriers to Discharge: Continued Medical Work up  Expected Discharge Plan and Services Expected Discharge Plan: [place]   Discharge Planning Services: CM Consult Post Acute Care Choice: [place] arrangements for the past 2 months: Single Family Home                           HH Arranged: PT, Nurse's Aide, RN [facility] Agency: [place] ([facility]) Date HH Agency Contacted: 04/20/19 Time [place]: [phone] Representative spoke with at [place]: [place] ([facility]) Interventions    Readmission Risk Interventions No flowsheet data found.

## 2019-04-23 NOTE — Progress Notes (Signed)
Physical Therapy Treatment Patient Details Name: Veronica Hubbard MRN: [id number] DOB: [birth year] Today's Date: 04/23/2019    History of Present Illness 84 y/o female arrives with intractible back pain, past medical history of hypertension, hypothyroidism as well as "bone marrow cancer" presents emergency department via EMS due to severe back pain.  The patient had kyphoplasty of L3 04/10/19, kyphoplasty of L3 (repeat) and L4 kyphoplasty 04/23/2019.    PT Comments    Patient up in bathroom with RN when PT entered room. Agreeable to return to bed with PT. Sit <> stand with RW and CGA. Pt with complaints of low back pain with transitional movements. Pt ambulated ~78ft with RW and CGA to return to bed. CGA to return to supine and repositioned for comfort. The patient would benefit from further skilled PT to continue to assess mobility and functional status to return to PLOF.   Follow Up Recommendations  SNF     Equipment Recommendations  Other (comment)    Recommendations for Other Services       Precautions / Restrictions Precautions Precautions: Fall Precaution Comments: s/p kyphoplasty Restrictions Weight Bearing Restrictions: Yes RLE Weight Bearing: Weight bearing as tolerated LLE Weight Bearing: Weight bearing as tolerated    Mobility  Bed Mobility Overal bed mobility: Needs Assistance Bed Mobility: Sit to Supine       Sit to supine: Min guard   General bed mobility comments: deferred, sitting on BSC at start of session  Transfers Overall transfer level: Needs assistance Equipment used: Rolling walker (2 wheeled) Transfers: Sit to/from Stand Sit to Stand: Min guard         General transfer comment: from [facility] in bathroom  Ambulation/Gait Ambulation/Gait assistance: Min guard Gait Distance (Feet): 5 Feet Assistive device: Rolling walker (2 wheeled)       General Gait Details: slow careful steps, cues for RW management   Stairs              Wheelchair Mobility    Modified Rankin (Stroke Patients Only)       Balance Overall balance assessment: Needs assistance Sitting-balance support: Feet unsupported;Bilateral upper extremity supported Sitting balance-Leahy Scale: Fair     Standing balance support: Bilateral upper extremity supported Standing balance-Leahy Scale: Fair                              Cognition Arousal/Alertness: Awake/alert Behavior During Therapy: WFL for tasks assessed/performed Overall Cognitive Status: Within Functional Limits for tasks assessed                                        Exercises      General Comments        Pertinent Vitals/Pain Pain Assessment: Faces Pain Score: 8  Faces Pain Scale: Hurts little more Pain Location: stand to sit Pain Descriptors / Indicators: Grimacing;Moaning Pain Intervention(s): Repositioned;Monitored during session    Home Living                      Prior Function            PT Goals (current goals can now be found in the care plan section) Acute Rehab PT Goals Patient Stated Goal: control pain PT Goal Formulation: With patient Time For Goal Achievement: 05/07/19 Progress towards PT goals: Progressing toward goals    Frequency  BID      PT Plan Current plan remains appropriate    Co-evaluation              AM-PAC PT "6 Clicks" Mobility   Outcome Measure  Help needed turning from your back to your side while in a flat bed without using bedrails?: A Lot Help needed moving from lying on your back to sitting on the side of a flat bed without using bedrails?: A Lot Help needed moving to and from a bed to a chair (including a wheelchair)?: A Little Help needed standing up from a chair using your arms (e.g., wheelchair or bedside chair)?: A Little Help needed to walk in hospital room?: A Little Help needed climbing 3-5 steps with a railing? : Total 6 Click Score: 14    End of  Session Equipment Utilized During Treatment: Gait belt Activity Tolerance: Patient limited by pain Patient left: with call bell/phone within reach;in chair;with chair alarm set Nurse Communication: Mobility status PT Visit Diagnosis: Muscle weakness (generalized) (M62.81);Difficulty in walking, not elsewhere classified (R26.2);Pain Pain - part of body: (low back)     Time: 6767-2094 PT Time Calculation (min) (ACUTE ONLY): 11 min  Charges:  $Therapeutic Activity: 8-22 mins                    Lieutenant Diego PT, DPT 3:28 PM,04/23/19 [phone]

## 2019-04-23 NOTE — Progress Notes (Signed)
PT Cancellation Note  Patient Details Name: Veronica Hubbard MRN: [id number] DOB: 06/10/1935   Cancelled Treatment:    Reason Eval/Treat Not Completed: Other (comment)(PT stopped in, RN at bedside administering medications. Pt still reported nausea and stated she just feels to weak right now for PT. PT will follow up as able.)   Lieutenant Diego PT, DPT 1:27 PM,04/23/19 [phone]

## 2019-04-23 NOTE — Progress Notes (Signed)
PT Cancellation Note  Patient Details Name: Suzette Flagler MRN: [id number] DOB: 1935-06-10   Cancelled Treatment:    Reason Eval/Treat Not Completed: Patient declined, no reason specified;Other (comment)(PT entered room, pt complaining of nausea and L sided back pain, requesting PT to try later. Repositioned pt minA, set up for breakfast so pt could attempt eating when ready.)  Lieutenant Diego PT, DPT 10:35 AM,04/23/19 [phone]

## 2019-04-23 NOTE — Progress Notes (Signed)
Physical Therapy Re-evaluation Patient Details Name: Veronica Hubbard MRN: [id number] DOB: [birth year] Today's Date: 04/23/2019    History of Present Illness 84 y/o female arrives with intractible back pain, past medical history of hypertension, hypothyroidism as well as "bone marrow cancer" presents emergency department via EMS due to severe back pain.  The patient had kyphoplasty of L3 04/10/19, kyphoplasty of L3 (repeat) and L4 kyphoplasty 04/23/2019.    PT Comments    PT re-evaluation performed this session s/p kyphoplasty L3, L4 04/22/2019, goals addressed. Patient sitting on [facility] when PT entered room with CNA. Patient stated that her back pain was 8/10 but main complaint was her nausea. Pt demonstrated fair balance in sitting. Sit <> stand minA for stabilization of RW and pt upon initially standing. Able to perform self care with CGA. Pt was able to take a few steps towards recliner in room with RW and CGA. Extensive time to make sure pt comfortable and settled.  Overall the patient demonstrated deficits (see "PT Problem List") that impede the patient's functional abilities, safety, and mobility and would benefit from skilled PT intervention. Recommendation is STR.     Follow Up Recommendations  SNF     Equipment Recommendations  Other (comment)(TBD)    Recommendations for Other Services       Precautions / Restrictions Precautions Precautions: Fall Precaution Comments: s/p kyphoplasty Restrictions Weight Bearing Restrictions: Yes RLE Weight Bearing: Weight bearing as tolerated LLE Weight Bearing: Weight bearing as tolerated    Mobility  Bed Mobility               General bed mobility comments: deferred, sitting on BSC at start of session  Transfers Overall transfer level: Needs assistance Equipment used: Rolling walker (2 wheeled) Transfers: Sit to/from Stand Sit to Stand: Min assist            Ambulation/Gait Ambulation/Gait assistance: Min guard Gait  Distance (Feet): 2 Feet Assistive device: Rolling walker (2 wheeled)       General Gait Details: slow careful steps, cues for RW management   Stairs             Wheelchair Mobility    Modified Rankin (Stroke Patients Only)       Balance Overall balance assessment: Needs assistance Sitting-balance support: Feet unsupported;Bilateral upper extremity supported Sitting balance-Leahy Scale: Fair       Standing balance-Leahy Scale: Poor                              Cognition Arousal/Alertness: Awake/alert Behavior During Therapy: Restless Overall Cognitive Status: Within Functional Limits for tasks assessed                                        Exercises      General Comments        Pertinent Vitals/Pain Pain Assessment: 0-10 Pain Score: 8  Pain Location: low back, near kypho site Pain Descriptors / Indicators: Grimacing;Moaning Pain Intervention(s): Limited activity within patient's tolerance;Monitored during session;Repositioned;Premedicated before session    Home Living                      Prior Function            PT Goals (current goals can now be found in the care plan section) Acute Rehab PT Goals Patient Stated Goal:  control pain PT Goal Formulation: With patient Time For Goal Achievement: 05/07/19 Progress towards PT goals: Progressing toward goals    Frequency    BID      PT Plan Current plan remains appropriate    Co-evaluation              AM-PAC PT "6 Clicks" Mobility   Outcome Measure  Help needed turning from your back to your side while in a flat bed without using bedrails?: A Lot Help needed moving from lying on your back to sitting on the side of a flat bed without using bedrails?: A Lot Help needed moving to and from a bed to a chair (including a wheelchair)?: A Little Help needed standing up from a chair using your arms (e.g., wheelchair or bedside chair)?: A Little Help  needed to walk in hospital room?: A Lot Help needed climbing 3-5 steps with a railing? : Total 6 Click Score: 13    End of Session Equipment Utilized During Treatment: Gait belt Activity Tolerance: Patient limited by pain;Other (comment)(Pt limited by nausea) Patient left: with call bell/phone within reach;in chair;with chair alarm set Nurse Communication: Mobility status PT Visit Diagnosis: Muscle weakness (generalized) (M62.81);Difficulty in walking, not elsewhere classified (R26.2);Pain Pain - part of body: (back pain)     Time: 1607-3710 PT Time Calculation (min) (ACUTE ONLY): 14 min  Charges:    Acute PT visit 1 Re-eval charge                     Lieutenant Diego PT, DPT 1:08 PM,04/23/19 [phone]

## 2019-04-23 NOTE — Progress Notes (Signed)
Patient complaining of significant low back pain as well as some nausea.  Have ordered a scopolamine patch and hope that helps with her nausea as she has been flat on her back for several days and that may be related to getting up now as well as a single dose of IV steroid to help with some inflammation probably related to the spine surgery yesterday.  If she is taking adequate p.o. she should be okay for dismissal with follow-up with me in 2 weeks

## 2019-04-24 ENCOUNTER — Encounter: Payer: Self-pay | Admitting: Orthopedic Surgery

## 2019-04-24 ENCOUNTER — Inpatient Hospital Stay: Payer: Medicare HMO

## 2019-04-24 MED ORDER — BARIUM SULFATE 2.1 % PO SUSP
450.0000 mL | ORAL | Status: AC
  Administered 2019-04-24 (×2): 450 mL via ORAL

## 2019-04-24 NOTE — Care Management Important Message (Signed)
Important Message  Patient Details  Name: Veronica Hubbard MRN: [id number] Date of Birth: 27-Sep-1935   Medicare Important Message Given:  Yes    [name] [name] 04/24/2019, 11:47 AM

## 2019-04-24 NOTE — Progress Notes (Signed)
[place] at [place] NAME: Veronica Hubbard    MR#:  [id number]  DATE OF BIRTH:  30-Sep-1935  SUBJECTIVE:  CHIEF COMPLAINT:   Chief Complaint  Patient presents with  . Back Pain   This morning patient complaining of significant nonspecific generalized abdominal pains and some back discomfort.  Patient status post recent kyphoplasty. Stat CT scan of the abdomen and pelvis requested   REVIEW OF SYSTEMS:  Review of Systems  Constitutional: Negative for chills and fever.  HENT: Negative for hearing loss and tinnitus.   Eyes: Negative for blurred vision and double vision.  Respiratory: Negative for cough and hemoptysis.   Cardiovascular: Negative for chest pain and palpitations.  Gastrointestinal: Negative for heartburn and nausea.  Genitourinary: Negative for dysuria and urgency.  Musculoskeletal: Negative for myalgias and neck pain.  Skin: Negative for itching and rash.  Neurological: Negative for dizziness and headaches.  Psychiatric/Behavioral: Negative for depression and hallucinations.    DRUG ALLERGIES:   Allergies  Allergen Reactions  . Aspirin Shortness Of Breath  . Celecoxib Shortness Of Breath  . Iodinated Diagnostic Agents Other (See Comments)    Reaction:  Decreased kidney function  . Losartan Itching  . Morphine Nausea And Vomiting  . Verapamil Itching   VITALS:  Blood pressure (!) 126/45, pulse 72, temperature 99.1 F (37.3 C), temperature source Oral, resp. rate 18, height 5\' 3"  (1.6 m), weight 60.6 kg, SpO2 96 %. PHYSICAL EXAMINATION:    Physical Exam  Constitutional: She is oriented to person, place, and time. She appears well-developed.  HENT:  Head: Normocephalic and atraumatic.  Right Ear: External ear normal.  Eyes: Pupils are equal, round, and reactive to light. Conjunctivae are normal. Right eye exhibits no discharge.  Neck: Normal range of motion. Neck supple. No tracheal deviation present.   Cardiovascular: Normal rate, regular rhythm and normal heart sounds.  Respiratory: Effort normal. She has no wheezes.  GI: Soft. Bowel sounds are normal. She exhibits no distension. There is no abdominal tenderness.  Musculoskeletal: Normal range of motion.        General: No edema.  Neurological: She is alert and oriented to person, place, and time.  Skin: Skin is warm. She is not diaphoretic. No erythema.  Psychiatric: She has a normal mood and affect.   LABORATORY PANEL:  Female CBC Recent Labs  Lab 04/23/19 0455  WBC 17.6*  HGB 12.2  HCT 38.3  PLT 288   ------------------------------------------------------------------------------------------------------------------ Chemistries  Recent Labs  Lab 04/23/19 0455  NA 138  K 3.5  CL 105  CO2 25  GLUCOSE 110*  BUN 12  CREATININE 1.03*  CALCIUM 10.1  MG 1.8   RADIOLOGY:  No results found. ASSESSMENT AND PLAN:  This is an 84 year old female admitted for back pain.  1.  Back pain: Intractable pain following kyphoplasty done by Dr. Rudene Christians from 04/10/2019. Pains appear better controlled with current regimen. Patient seen by orthopedic service.  Found to have new L4 compression fracture.  Patient had kyphoplasty done on 04/22/2019 by Dr. Rudene Christians Seen by physical therapist.  Skilled nursing facility placement recommended.  Case manager already working on placement. Follow-up on physical therapy evaluation and recommendation.  2.  Acute urinary retention. Bladder scan revealed 800 cc previously.  Nursing staff was unable to place Foley catheter.  Urology consultation was placed. Later notified by nursing staff that patient voided about 600 cc.  Patient seen by urologist.  Appreciate input.  No Foley catheter  placed at this time since patient appears to be voiding well.  3.    Urinary tract infection ; present on admission. Urine culture growing Enterococcus faecalis.  Rocephin discontinued and patient being treated with  ampicillin.  4.  Oral thrush: Continue swish and swallow nystatin as well as vitamin C tablets.  5.  Hypertension: Blood pressure better controlled on current regimen   6.  Hypothyroidism:  TSH level elevated at 9.749.   Increased dose of Synthroid from 112 to 150 mcg p.o. daily.  Repeat thyroid function test by primary care physician as outpatient  7.  Asthma: Continue long-acting bronchial agonist as well as albuterol  8.  Depression: Continue Celexa  9.  Nonspecific abdominal pains this morning Requested for stat CT abdomen and pelvis. Pain control with pain meds.    DVT prophylaxis: Lovenox   All the records are reviewed and case discussed with Care Management/Social Worker. Management plans discussed with the patient, family and they are in agreement.  CODE STATUS: Full Code  TOTAL TIME TAKING CARE OF THIS PATIENT: 35 minutes.   More than 50% of the time was spent in counseling/coordination of care: YES  POSSIBLE D/C IN 1-2 DAYS, DEPENDING ON CLINICAL CONDITION.   Vernice Bowker M.D on 04/24/2019 at 10:11 AM  Between 7am to 6pm - Pager - [phone]  After 6pm go to [email or web address] - Proofreader  [facility]  Office  [phone]  CC: Primary care physician; Zhou-Talbert, Elwyn Lade, MD  Note: This dictation was prepared with Dragon dictation along with smaller phrase technology. Any transcriptional errors that result from this process are unintentional.

## 2019-04-24 NOTE — Progress Notes (Signed)
PT Cancellation Note  Patient Details Name: Veronica Hubbard MRN: [id number] DOB: 11-17-1934   Cancelled Treatment:    Reason Eval/Treat Not Completed: Medical issues which prohibited therapy   Attempted session this am.  RN requested hold for therapy today as she is prepping for CT and having a difficulty time toleration prep - nausea/diarrhea.  Will continue as appropriate tomorrow.   Chesley Noon 04/24/2019, 11:04 AM

## 2019-04-24 NOTE — TOC Progression Note (Signed)
Transition of Care [facility]) - Progression Note    Patient Details  Name: Veronica Hubbard MRN: [id number] Date of Birth: [birth year]  Transition of Care [facility]) CM/SW Contact  Aletha Allebach, Lenice Llamas Phone Number: [phone] 04/24/2019, 10:07 AM  Clinical Narrative:   Per [facility] SNF authorization is good through today and if patient does not admit to [facility] today then a new SNF authorization will be needed.        Expected Discharge Plan: [place] Barriers to Discharge: Continued Medical Work up  Expected Discharge Plan and Services Expected Discharge Plan: [place]   Discharge Planning Services: CM Consult Post Acute Care Choice: [place] arrangements for the past 2 months: Single Family Home                           HH Arranged: PT, Nurse's Aide, RN [place] Agency: [place] ([facility]) Date HH Agency Contacted: 04/20/19 Time [place]: [phone] Representative spoke with at [place]: [place] ([facility]) Interventions    Readmission Risk Interventions No flowsheet data found.

## 2019-04-25 DIAGNOSIS — N390 Urinary tract infection, site not specified: Secondary | ICD-10-CM

## 2019-04-25 DIAGNOSIS — C969 Malignant neoplasm of lymphoid, hematopoietic and related tissue, unspecified: Secondary | ICD-10-CM

## 2019-04-25 DIAGNOSIS — E039 Hypothyroidism, unspecified: Secondary | ICD-10-CM

## 2019-04-25 DIAGNOSIS — R161 Splenomegaly, not elsewhere classified: Secondary | ICD-10-CM

## 2019-04-25 DIAGNOSIS — M4856XD Collapsed vertebra, not elsewhere classified, lumbar region, subsequent encounter for fracture with routine healing: Secondary | ICD-10-CM

## 2019-04-25 DIAGNOSIS — Z803 Family history of malignant neoplasm of breast: Secondary | ICD-10-CM

## 2019-04-25 DIAGNOSIS — D473 Essential (hemorrhagic) thrombocythemia: Secondary | ICD-10-CM

## 2019-04-25 DIAGNOSIS — Z806 Family history of leukemia: Secondary | ICD-10-CM

## 2019-04-25 DIAGNOSIS — Z91040 Latex allergy status: Secondary | ICD-10-CM

## 2019-04-25 DIAGNOSIS — D649 Anemia, unspecified: Secondary | ICD-10-CM

## 2019-04-25 DIAGNOSIS — Z9221 Personal history of antineoplastic chemotherapy: Secondary | ICD-10-CM

## 2019-04-25 DIAGNOSIS — Z87891 Personal history of nicotine dependence: Secondary | ICD-10-CM

## 2019-04-25 DIAGNOSIS — Z886 Allergy status to analgesic agent status: Secondary | ICD-10-CM

## 2019-04-25 DIAGNOSIS — I10 Essential (primary) hypertension: Secondary | ICD-10-CM

## 2019-04-25 DIAGNOSIS — Z888 Allergy status to other drugs, medicaments and biological substances status: Secondary | ICD-10-CM

## 2019-04-25 DIAGNOSIS — I712 Thoracic aortic aneurysm, without rupture: Secondary | ICD-10-CM

## 2019-04-25 DIAGNOSIS — Z885 Allergy status to narcotic agent status: Secondary | ICD-10-CM

## 2019-04-25 LAB — BASIC METABOLIC PANEL
Anion gap: 8 (ref 5–15)
BUN: 14 mg/dL (ref 8–23)
CO2: 24 mmol/L (ref 22–32)
Calcium: 9.8 mg/dL (ref 8.9–10.3)
Chloride: 105 mmol/L (ref 98–111)
Creatinine, Ser: 1.09 mg/dL — ABNORMAL HIGH (ref 0.44–1.00)
GFR calc Af Amer: 55 mL/min — ABNORMAL LOW (ref >60)
GFR calc non Af Amer: 47 mL/min — ABNORMAL LOW (ref >60)
Glucose, Bld: 98 mg/dL (ref 70–99)
Potassium: 3.2 mmol/L — ABNORMAL LOW (ref 3.5–5.1)
Sodium: 137 mmol/L (ref 135–145)

## 2019-04-25 LAB — CBC WITH DIFFERENTIAL/PLATELET
Abs Immature Granulocytes: 1 10*3/uL — ABNORMAL HIGH (ref 0.00–0.07)
Band Neutrophils: 1 %
Basophils Absolute: 0.3 10*3/uL — ABNORMAL HIGH (ref 0.0–0.1)
Basophils Relative: 1 %
Eosinophils Absolute: 0.3 10*3/uL (ref 0.0–0.5)
Eosinophils Relative: 1 %
HCT: 36 % (ref 36.0–46.0)
Hemoglobin: 11.5 g/dL — ABNORMAL LOW (ref 12.0–15.0)
Lymphocytes Relative: 11 %
Lymphs Abs: 2.8 10*3/uL (ref 0.7–4.0)
MCH: 27.2 pg (ref 26.0–34.0)
MCHC: 31.9 g/dL (ref 30.0–36.0)
MCV: 85.1 fL (ref 80.0–100.0)
Metamyelocytes Relative: 2 %
Monocytes Absolute: 1.3 10*3/uL — ABNORMAL HIGH (ref 0.1–1.0)
Monocytes Relative: 5 %
Neutro Abs: 19.6 10*3/uL — ABNORMAL HIGH (ref 1.7–7.7)
Neutrophils Relative %: 77 %
Platelets: 312 10*3/uL (ref 150–400)
Promyelocytes Relative: 2 %
RBC: 4.23 MIL/uL (ref 3.87–5.11)
RDW: 20.3 % — ABNORMAL HIGH (ref 11.5–15.5)
WBC: 25.1 10*3/uL — ABNORMAL HIGH (ref 4.0–10.5)
nRBC: 0.2 % (ref 0.0–0.2)

## 2019-04-25 LAB — MAGNESIUM: Magnesium: 1.8 mg/dL (ref 1.7–2.4)

## 2019-04-25 LAB — CBC
HCT: 36 % (ref 36.0–46.0)
Hemoglobin: 11.5 g/dL — ABNORMAL LOW (ref 12.0–15.0)
MCH: 27.2 pg (ref 26.0–34.0)
MCHC: 31.9 g/dL (ref 30.0–36.0)
MCV: 85.1 fL (ref 80.0–100.0)
Platelets: 309 10*3/uL (ref 150–400)
RBC: 4.23 MIL/uL (ref 3.87–5.11)
RDW: 20.1 % — ABNORMAL HIGH (ref 11.5–15.5)
WBC: 25.5 10*3/uL — ABNORMAL HIGH (ref 4.0–10.5)
nRBC: 0.2 % (ref 0.0–0.2)

## 2019-04-25 LAB — PATHOLOGIST SMEAR REVIEW

## 2019-04-25 MED ORDER — POTASSIUM CHLORIDE CRYS ER 20 MEQ PO TBCR
40.0000 meq | EXTENDED_RELEASE_TABLET | Freq: Once | ORAL | Status: AC
  Administered 2019-04-25: 40 meq via ORAL
  Filled 2019-04-25: qty 2

## 2019-04-25 MED ORDER — PHENOL 1.4 % MT LIQD
1.0000 | OROMUCOSAL | Status: DC | PRN
  Filled 2019-04-25 (×2): qty 177

## 2019-04-25 NOTE — TOC Progression Note (Signed)
Transition of Care [facility]) - Progression Note    Patient Details  Name: Veronica Hubbard MRN: [id number] Date of Birth: Jan 23, 1935  Transition of Care [facility]) CM/SW Contact  Wisdom Seybold, Lenice Llamas Phone Number: [phone]  04/25/2019, 5:02 PM  Clinical Narrative: [place] SNF authorization has been received, authorization # [id number]. Authorization is good through the weekend. [facility] liaison is aware of above.     Expected Discharge Plan: [place] Barriers to Discharge: Continued Medical Work up  Expected Discharge Plan and Services Expected Discharge Plan: [place]   Discharge Planning Services: CM Consult Post Acute Care Choice: [place] arrangements for the past 2 months: Single Family Home                           HH Arranged: PT, Nurse's Aide, RN [place] Agency: [place] ([facility]) Date HH Agency Contacted: 04/20/19 Time [place]: [phone] Representative spoke with at [place]: [place] ([place]) Interventions    Readmission Risk Interventions No flowsheet data found.

## 2019-04-25 NOTE — TOC Progression Note (Signed)
Transition of Care [facility]) - Progression Note    Patient Details  Name: Veronica Hubbard MRN: [id number] Date of Birth: 1935/01/28  Transition of Care [facility]) CM/SW Contact  Anitria Andon, Lenice Llamas Phone Number: [phone]  04/25/2019, 8:15 AM  Clinical Narrative: Clinical Social Worker (CSW) re-started [facility] SNF authorization today. CSW faxed clinicals to [facility].     Expected Discharge Plan: [place] Barriers to Discharge: Continued Medical Work up  Expected Discharge Plan and Services Expected Discharge Plan: [place]   Discharge Planning Services: CM Consult Post Acute Care Choice: [place] arrangements for the past 2 months: Single Family Home                           HH Arranged: PT, Nurse's Aide, RN [place] Agency: [place] ([facility]) Date HH Agency Contacted: 04/20/19 Time [place]: [phone] Representative spoke with at [place]: [place] ([place]) Interventions    Readmission Risk Interventions No flowsheet data found.

## 2019-04-25 NOTE — Progress Notes (Addendum)
[place] at [place] NAME: Lynniah Janoski    MR#:  [id number]  DATE OF BIRTH:  [birth year]  SUBJECTIVE:  CHIEF COMPLAINT:   Chief Complaint  Patient presents with  . Back Pain   Patient had kyphoplasty done recently.  No back pain this morning.  Complaining of some sore throat.  Rapid strep test requested.  No fevers.  REVIEW OF SYSTEMS:  Review of Systems  Constitutional: Negative for chills and fever.       Sore throat  HENT: Negative for hearing loss and tinnitus.   Eyes: Negative for blurred vision and double vision.  Respiratory: Negative for cough and hemoptysis.   Cardiovascular: Negative for chest pain and palpitations.  Gastrointestinal: Negative for heartburn and nausea.  Genitourinary: Negative for dysuria and urgency.  Musculoskeletal: Negative for myalgias and neck pain.  Skin: Negative for itching and rash.  Neurological: Negative for dizziness and headaches.  Psychiatric/Behavioral: Negative for depression and hallucinations.    DRUG ALLERGIES:   Allergies  Allergen Reactions  . Aspirin Shortness Of Breath  . Celecoxib Shortness Of Breath  . Iodinated Diagnostic Agents Other (See Comments)    Reaction:  Decreased kidney function  . Losartan Itching  . Morphine Nausea And Vomiting  . Verapamil Itching   VITALS:  Blood pressure 97/75, pulse 89, temperature 98.5 F (36.9 C), resp. rate 19, height 5' 3"  (1.6 m), weight 65.3 kg, SpO2 96 %. PHYSICAL EXAMINATION:    Physical Exam  Constitutional: She is oriented to person, place, and time. She appears well-developed.  HENT:  Head: Normocephalic and atraumatic.  Right Ear: External ear normal.  Eyes: Pupils are equal, round, and reactive to light. Conjunctivae are normal. Right eye exhibits no discharge.  Neck: Normal range of motion. Neck supple. No tracheal deviation present.  Cardiovascular: Normal rate, regular rhythm and normal heart sounds.   Respiratory: Effort normal. She has no wheezes.  GI: Soft. Bowel sounds are normal. She exhibits no distension. There is no abdominal tenderness.  Musculoskeletal: Normal range of motion.        General: No edema.  Neurological: She is alert and oriented to person, place, and time.  Skin: Skin is warm. She is not diaphoretic. No erythema.  Psychiatric: She has a normal mood and affect.   LABORATORY PANEL:  Female CBC Recent Labs  Lab 04/25/19 0632  WBC 25.1*  25.5*  HGB 11.5*  11.5*  HCT 36.0  36.0  PLT 312  309   ------------------------------------------------------------------------------------------------------------------ Chemistries  Recent Labs  Lab 04/25/19 0632  NA 137  K 3.2*  CL 105  CO2 24  GLUCOSE 98  BUN 14  CREATININE 1.09*  CALCIUM 9.8  MG 1.8   RADIOLOGY:  No results found. ASSESSMENT AND PLAN:  This is an 84 year old female admitted for back pain.  1.  Back pain: Intractable pain following kyphoplasty done by Dr. Rudene Christians from 04/10/2019. Pains appear better controlled with current regimen. Patient seen by orthopedic service.  Found to have new L4 compression fracture.  Patient had kyphoplasty done on 04/22/2019 by Dr. Rudene Christians Seen by physical therapist.  Skilled nursing facility placement recommended.  Case manager already working on placement.  2.  Acute urinary retention. Bladder scan revealed 800 cc previously.  Nursing staff was unable to place Foley catheter.  Urology consultation was placed. Later notified by nursing staff that patient voided about 600 cc.  Patient seen by urologist.  Appreciate input.  No Foley catheter  placed at this time since patient appears to be voiding well.  3.    Urinary tract infection ; present on admission. Urine culture growing Enterococcus faecalis.  Rocephin discontinued and patient being treated with ampicillin.  4.  Oral thrush: Continue swish and swallow nystatin as well as vitamin C tablets.  5.   Hypertension: Blood pressure better controlled on current regimen   6.  Hypothyroidism:  TSH level elevated at 9.749.   Increased dose of Synthroid from 112 to 150 mcg p.o. daily.  Repeat thyroid function test by primary care physician as outpatient  7.  Asthma: Continue long-acting bronchial agonist as well as albuterol  8.  Depression: Continue Celexa  9.  Nonspecific abdominal pains  No abdominal pains today.  CT abdomen and pelvis with no acute findings.  10.  Sore throat No fevers.  Requested for rapid strep test.  11.  Acute on chronic leukocytosis Noted further increase in WBC count to 25,000 today. Requested for oncology consult.  Patient seen by Dr. Janese Banks.  Appreciate input. She has seen hematology oncology at [facility] in the past and underwent a bone marrow biopsy in July 2019.  Prior to that she had a bone marrow in 2015 and she had the diagnosis of MDS/MPN and was on Jakafi which was stopped after her visit in August 2019 and watchful monitoring was recommended. Requested for peripheral smear and flow cytometry. Patient to follow-up with her hematologist at [facility] post discharge from the hospital    DVT prophylaxis: Lovenox  Disposition ;anticipate discharge to rehab this weekend if patient feeling better.  I attempted calling patient's sister for update today but no response.  All the records are reviewed and case discussed with Care Management/Social Worker. Management plans discussed with the patient, family and they are in agreement.  CODE STATUS: Full Code  TOTAL TIME TAKING CARE OF THIS PATIENT: 36 minutes.   More than 50% of the time was spent in counseling/coordination of care: YES  POSSIBLE D/C IN 1-2 DAYS, DEPENDING ON CLINICAL CONDITION.   Dj Senteno M.D on 04/25/2019 at 1:36 PM  Between 7am to 6pm - Pager - [phone]  After 6pm go to [email or web address] - Proofreader  [facility]  Office  [phone]  CC: Primary care  physician; Zhou-Talbert, Elwyn Lade, MD  Note: This dictation was prepared with Dragon dictation along with smaller phrase technology. Any transcriptional errors that result from this process are unintentional.

## 2019-04-25 NOTE — Progress Notes (Signed)
Physical Therapy Treatment Patient Details Name: Veronica Hubbard MRN: [id number] DOB: 07-Nov-1934 Today's Date: 04/25/2019    History of Present Illness 84 y/o female arrives with intractible back pain, past medical history of hypertension, hypothyroidism as well as "bone marrow cancer" presents emergency department via EMS due to severe back pain.  The patient had kyphoplasty of L3 04/10/19, kyphoplasty of L3 (repeat) and L4 kyphoplasty 04/23/2019.    PT Comments    Author attempted to see patient in morning, but pt on BSC and with emesis bag, reportedly attempting BM. Pt received in recliner in afternoon, feeling "much better." Pt motivated to get up and AMB as she has been unable for several days now. Pt continues to move slow AMB ~0.78m/s which is 50% speed needed to safely AMB household distances. Pt requires many standing breaks 2/2 fatigue in legs, but appears safe and steady without any LOB. Pt confidence remains high. Rising from sitting is still very difficult and limited. Pt progressing well overall.   *frequency of treatment will be updated to 7x/week as per current protocol.     Follow Up Recommendations  SNF     Equipment Recommendations  Rolling walker with 5" wheels    Recommendations for Other Services       Precautions / Restrictions Precautions Precautions: Fall Precaution Comments: s/p kyphoplasty Restrictions Weight Bearing Restrictions: No Other Position/Activity Restrictions: "WBAT"    Mobility  Bed Mobility               General bed mobility comments: up in chair upon entry  Transfers Overall transfer level: Needs assistance Equipment used: Rolling walker (2 wheeled) Transfers: Sit to/from Stand           General transfer comment: still very weak in legs, absolute need for BUE to rise from chair  Ambulation/Gait Ambulation/Gait assistance: Supervision;Min guard Gait Distance (Feet): 160 Feet Assistive device: Rolling walker (2  wheeled) Gait Pattern/deviations: (slow repeated melt into forward flexion; multimodal cues to correct posture and RW distance.) Gait velocity: 0.8m/s       Stairs             Wheelchair Mobility    Modified Rankin (Stroke Patients Only)       Balance Overall balance assessment: Modified Independent;No apparent balance deficits (not formally assessed)                                          Cognition Arousal/Alertness: Awake/alert Behavior During Therapy: WFL for tasks assessed/performed Overall Cognitive Status: Within Functional Limits for tasks assessed                                        Exercises      General Comments        Pertinent Vitals/Pain Pain Assessment: No/denies pain(back feels pretty good while up moving, no pain)    Home Living                      Prior Function            PT Goals (current goals can now be found in the care plan section) Acute Rehab PT Goals Patient Stated Goal: control pain PT Goal Formulation: With patient Time For Goal Achievement: 05/07/19 Progress towards PT goals: Progressing toward  goals    Frequency    7X/week      PT Plan Current plan remains appropriate;Frequency needs to be updated    Co-evaluation              AM-PAC PT "6 Clicks" Mobility   Outcome Measure  Help needed turning from your back to your side while in a flat bed without using bedrails?: A Little Help needed moving from lying on your back to sitting on the side of a flat bed without using bedrails?: A Little Help needed moving to and from a bed to a chair (including a wheelchair)?: A Little Help needed standing up from a chair using your arms (e.g., wheelchair or bedside chair)?: A Little Help needed to walk in hospital room?: A Little Help needed climbing 3-5 steps with a railing? : A Lot 6 Click Score: 17    End of Session Equipment Utilized During Treatment: Gait  belt Activity Tolerance: Patient limited by pain Patient left: with call bell/phone within reach;in chair;with chair alarm set Nurse Communication: Mobility status PT Visit Diagnosis: Muscle weakness (generalized) (M62.81);Difficulty in walking, not elsewhere classified (R26.2);Pain Pain - part of body: (low back)     Time: 1856-3149 PT Time Calculation (min) (ACUTE ONLY): 26 min  Charges:  $Gait Training: 23-37 mins                     4:40 PM, 04/25/19 Etta Grandchild, PT, DPT Physical Therapist - [facility]  [phone] ([place])    Buccola,Allan C 04/25/2019, 4:36 PM

## 2019-04-25 NOTE — Consult Note (Signed)
Hematology/Oncology Consult note [facility] Telephone:([phone] [phone]  Patient Care Team: Zhou-Talbert, Elwyn Lade, MD as PCP - General (Family Medicine)   Name of the patient: Veronica Hubbard  [id number]  01/08/1935    Reason for consult: Leukocytosis   Requesting physician: Dr. Stark Jock  Date of visit:04/25/2019    History of presenting illness-patient is a 84 year old female with a past medical history significant for hypertension and hypothyroidism. Patient is a 84 year old female who was admitted to the hospital with worsening back pain.  She underwent kyphoplasty of the L3 vertebral body for compression fracture about 10 days ago.  She has a history of myelodysplastic syndrome/MPN.  She has chronic anemia leukocytosis and thrombocytosis.  She has undergone 2 bone marrow biopsies in the past.  First 1 was in 2015 which showed moderately hypercellular and moderate atypical megakaryocyte ptosis.  20% ring sideroblasts.  MDS FISH panel was negative.  In the setting of thrombocytosis, leukocytosis and moderate anemia the abnormal marrow findings are suspicious for MDS/MPN with features suggestive of refractory anemia with ringed sideroblasts and thrombocytosis.  She was seen by heme-onc at [facility] in August 2019 and at that time she was asked to discontinue her Jakafi and recommend observation.  She also underwent a bone marrow biopsy in July 2019 which again showed hypercellular marrow with panmyelosis and left shift with atypical megakaryocytic hyperplasia and ringed sideroblasts.confirmation diagnosis of MDS/MPN with ringed sideroblasts and thrombocytosis could not be made at that time.  Oncology currently consulted for leukocytosis.  Patient currently reports feeling fatigued and has chronic back pain. She is also currently being treated for possible UTI.  ECOG PS- 2-3  Pain scale- 5   Review of systems- Review of Systems  Constitutional: Positive  for malaise/fatigue. Negative for chills, fever and weight loss.  HENT: Negative for congestion, ear discharge and nosebleeds.   Eyes: Negative for blurred vision.  Respiratory: Negative for cough, hemoptysis, sputum production, shortness of breath and wheezing.   Cardiovascular: Negative for chest pain, palpitations, orthopnea and claudication.  Gastrointestinal: Negative for abdominal pain, blood in stool, constipation, diarrhea, heartburn, melena, nausea and vomiting.  Genitourinary: Negative for dysuria, flank pain, frequency, hematuria and urgency.  Musculoskeletal: Positive for back pain. Negative for joint pain and myalgias.  Skin: Negative for rash.  Neurological: Negative for dizziness, tingling, focal weakness, seizures, weakness and headaches.  Endo/Heme/Allergies: Does not bruise/bleed easily.  Psychiatric/Behavioral: Negative for depression and suicidal ideas. The patient does not have insomnia.     Allergies  Allergen Reactions   Aspirin Shortness Of Breath   Celecoxib Shortness Of Breath   Iodinated Diagnostic Agents Other (See Comments)    Reaction:  Decreased kidney function   Losartan Itching   Morphine Nausea And Vomiting   Verapamil Itching    Patient Active Problem List   Diagnosis Date Noted   Intractable pain 04/19/2019   AAA (abdominal aortic aneurysm) without rupture ([place]) 04/09/2017   Bilateral carotid artery stenosis 04/09/2017   Thoracic aortic aneurysm without rupture ([place]) 04/09/2017   Hyponatremia 02/23/2016   Chest pain 02/23/2016   Gastroenteritis due to norovirus 02/09/2016     Past Medical History:  Diagnosis Date   Asthma    Cancer ([place]) 02/2018   bone marrow cancer   Depression    GERD (gastroesophageal reflux disease)    Hypertension    Hypothyroidism    Personal history of chemotherapy    Spinal stenosis      Past Surgical History:  Procedure  Laterality Date   ABDOMINAL HYSTERECTOMY     BACK SURGERY       BREAST BIOPSY Left 2003   neg   cataracts     KYPHOPLASTY N/A 04/22/2019   Procedure: KYPHOPLASTY;  Surgeon: Hessie Knows, MD;  Location: [facility] ORS;  Service: Orthopedics;  Laterality: N/A;   KYPHOPLASTY N/A 04/10/2019   Procedure: KYPHOPLASTY L3;  Surgeon: Hessie Knows, MD;  Location: [facility] ORS;  Service: Orthopedics;  Laterality: N/A;   lipoma removal     SHOULDER SURGERY      Social History   Socioeconomic History   Marital status: Single    Spouse name: Not on file   Number of children: Not on file   Years of education: Not on file   Highest education level: Not on file  Occupational History   Not on file  Social Needs   Financial resource strain: Not on file   Food insecurity    Worry: Not on file    Inability: Not on file   Transportation needs    Medical: Not on file    Non-medical: Not on file  Tobacco Use   Smoking status: Former Smoker    Types: Cigarettes   Smokeless tobacco: Never Used  Substance and Sexual Activity   Alcohol use: No   Drug use: No   Sexual activity: Not on file  Lifestyle   Physical activity    Days per week: Not on file    Minutes per session: Not on file   Stress: Not on file  Relationships   Social connections    Talks on phone: Not on file    Gets together: Not on file    Attends religious service: Not on file    Active member of club or organization: Not on file    Attends meetings of clubs or organizations: Not on file    Relationship status: Not on file   Intimate partner violence    Fear of current or ex partner: Not on file    Emotionally abused: Not on file    Physically abused: Not on file    Forced sexual activity: Not on file  Other Topics Concern   Not on file  Social History Narrative   Not on file     Family History  Problem Relation Age of Onset   Leukemia Mother    CAD Father    Diabetes Father    Breast cancer Sister 90     Current Facility-Administered Medications:     acetaminophen (TYLENOL) tablet 650 mg, 650 mg, Oral, Q6H PRN, 650 mg at 04/23/19 2025 **OR** acetaminophen (TYLENOL) suppository 650 mg, 650 mg, Rectal, Q6H PRN, Hessie Knows, MD   albuterol (PROVENTIL) (2.5 MG/3ML) 0.083% nebulizer solution 2.5 mg, 2.5 mg, Nebulization, Q4H PRN, Hessie Knows, MD, 2.5 mg at 04/22/19 1146   allopurinol (ZYLOPRIM) tablet 200 mg, 200 mg, Oral, Daily, Hessie Knows, MD, 200 mg at 04/24/19 1757   alum & mag hydroxide-simeth (MAALOX/MYLANTA) 200-200-20 MG/5ML suspension 30 mL, 30 mL, Oral, Q6H PRN, Hessie Knows, MD, 30 mL at 04/21/19 1608   cholecalciferol (VITAMIN D3) tablet 1,000 Units, 1,000 Units, Oral, Daily, Hessie Knows, MD, 1,000 Units at 04/24/19 9417   citalopram (CELEXA) tablet 20 mg, 20 mg, Oral, Daily, Hessie Knows, MD, 20 mg at 04/24/19 4081   cyclobenzaprine (FLEXERIL) tablet 5-10 mg, 5-10 mg, Oral, QHS PRN, Hessie Knows, MD   docusate sodium (COLACE) capsule 100 mg, 100 mg, Oral, BID, Hessie Knows, MD, 100 mg  at 04/24/19 2254   enoxaparin (LOVENOX) injection 40 mg, 40 mg, Subcutaneous, Q24H, Ojie, Jude, MD, 40 mg at 04/24/19 2255   ezetimibe (ZETIA) tablet 10 mg, 10 mg, Oral, QHS, Hessie Knows, MD, 10 mg at 04/24/19 2254   fluticasone (FLONASE) 50 MCG/ACT nasal spray 1 spray, 1 spray, Each Nare, BID, Hessie Knows, MD, 1 spray at 04/24/19 2255   fluticasone furoate-vilanterol (BREO ELLIPTA) 100-25 MCG/INH 1 puff, 1 puff, Inhalation, Daily, 1 puff at 04/24/19 0939 **AND** umeclidinium bromide (INCRUSE ELLIPTA) 62.5 MCG/INH 1 puff, 1 puff, Inhalation, Daily, Hessie Knows, MD, 1 puff at 04/24/19 0939   hydrALAZINE (APRESOLINE) tablet 50 mg, 50 mg, Oral, Q8H, Hessie Knows, MD, 50 mg at 04/25/19 0509   isosorbide mononitrate (IMDUR) 24 hr tablet 30 mg, 30 mg, Oral, Daily, Hessie Knows, MD, 30 mg at 04/24/19 4765   lactated ringers infusion, , Intravenous, Continuous, Hessie Knows, MD, Last Rate: 75 mL/hr at 04/22/19 1933   levothyroxine  (SYNTHROID) tablet 150 mcg, 150 mcg, Oral, QAC breakfast, Hessie Knows, MD, 150 mcg at 04/25/19 4650   Melatonin TABS 5 mg, 5 mg, Oral, QHS, Hessie Knows, MD, 5 mg at 04/24/19 2255   metoCLOPramide (REGLAN) tablet 5-10 mg, 5-10 mg, Oral, Q8H PRN **OR** metoCLOPramide (REGLAN) injection 5-10 mg, 5-10 mg, Intravenous, Q8H PRN, Hessie Knows, MD   morphine 2 MG/ML injection 2 mg, 2 mg, Intravenous, Q4H PRN, Hessie Knows, MD, 2 mg at 04/24/19 1413   nystatin (MYCOSTATIN) 100000 UNIT/ML suspension 500,000 Units, 5 mL, Oral, QID, Hessie Knows, MD, 500,000 Units at 04/25/19 0946   omega-3 acid ethyl esters (LOVAZA) capsule 1 g, 1 g, Oral, QHS, Hessie Knows, MD, 1 g at 04/24/19 2256   ondansetron (ZOFRAN) tablet 4 mg, 4 mg, Oral, Q6H PRN, 4 mg at 04/21/19 0558 **OR** ondansetron (ZOFRAN) injection 4 mg, 4 mg, Intravenous, Q6H PRN, Hessie Knows, MD   ondansetron (ZOFRAN-ODT) disintegrating tablet 4 mg, 4 mg, Oral, Q8H PRN, Hessie Knows, MD   oxyCODONE (Oxy IR/ROXICODONE) immediate release tablet 7.5 mg, 7.5 mg, Oral, Q6H PRN, Ojie, Jude, MD, 7.5 mg at 04/24/19 1800   pantoprazole (PROTONIX) EC tablet 40 mg, 40 mg, Oral, BID, Hessie Knows, MD, 40 mg at 04/24/19 2254   phenol (CHLORASEPTIC) mouth spray 1 spray, 1 spray, Mouth/Throat, PRN, Ojie, Jude, MD   polyethylene glycol (MIRALAX / GLYCOLAX) packet 17 g, 17 g, Oral, Daily, Ojie, Jude, MD, 17 g at 04/25/19 0946   potassium chloride SA (K-DUR) CR tablet 40 mEq, 40 mEq, Oral, Once, Ojie, Jude, MD   scopolamine (TRANSDERM-SCOP) 1 MG/3DAYS 1.5 mg, 1 patch, Transdermal, Q72H, Hessie Knows, MD, 1.5 mg at 04/23/19 1308   vitamin C (ASCORBIC ACID) tablet 1,000 mg, 1,000 mg, Oral, Daily, Hessie Knows, MD, 1,000 mg at 04/24/19 3546   Physical exam:  Vitals:   04/24/19 1411 04/24/19 1535 04/24/19 2306 04/25/19 0500  BP: 110/64 107/73 128/64   Pulse:  80 94   Resp:  18 19   Temp:  98.7 F (37.1 C) 98.5 F (36.9 C)   TempSrc:  Oral      SpO2:  93% 96%   Weight:    143 lb 15.4 oz (65.3 kg)  Height:       Physical Exam Constitutional:      Comments: She is elderly frail and fatigued  HENT:     Head: Normocephalic and atraumatic.  Eyes:     Pupils: Pupils are equal, round, and reactive to light.  Neck:  Musculoskeletal: Normal range of motion.  Cardiovascular:     Rate and Rhythm: Normal rate and regular rhythm.     Heart sounds: Normal heart sounds.  Pulmonary:     Effort: Pulmonary effort is normal.     Breath sounds: Normal breath sounds.  Abdominal:     General: Bowel sounds are normal. There is no distension.     Palpations: Abdomen is soft.     Tenderness: There is no abdominal tenderness.     Comments: Mild palpable splenomegaly  Skin:    General: Skin is warm and dry.  Neurological:     Mental Status: She is alert and oriented to person, place, and time.        CMP Latest Ref Rng & Units 04/25/2019  Glucose 70 - 99 mg/dL 98  BUN 8 - 23 mg/dL 14  Creatinine 0.44 - 1.00 mg/dL 1.09(H)  Sodium 135 - 145 mmol/L 137  Potassium 3.5 - 5.1 mmol/L 3.2(L)  Chloride 98 - 111 mmol/L 105  CO2 22 - 32 mmol/L 24  Calcium 8.9 - 10.3 mg/dL 9.8  Total Protein 6.5 - 8.1 g/dL -  Total Bilirubin 0.3 - 1.2 mg/dL -  Alkaline Phos 38 - 126 U/L -  AST 15 - 41 U/L -  ALT 14 - 54 U/L -   CBC Latest Ref Rng & Units 04/25/2019  WBC 4.0 - 10.5 K/uL 25.5(H)  Hemoglobin 12.0 - 15.0 g/dL 11.5(L)  Hematocrit 36.0 - 46.0 % 36.0  Platelets 150 - 400 K/uL 309    _0 @  Ct Abdomen Pelvis Wo Contrast  Result Date: 04/24/2019 CLINICAL DATA:  84 year old female with acute abdominal and pelvic pain. EXAM: CT ABDOMEN AND PELVIS WITHOUT CONTRAST TECHNIQUE: Multidetector CT imaging of the abdomen and pelvis was performed following the standard protocol without IV contrast. COMPARISON:  04/19/2019 and prior CTs FINDINGS: Please note that parenchymal abnormalities may be missed without intravenous contrast. Lower chest: Mild  RIGHT basilar atelectasis noted. Hepatobiliary: The liver and gallbladder are unremarkable. No biliary dilatation. Pancreas: Unremarkable Spleen: Splenomegaly again noted. Adrenals/Urinary Tract: Nonobstructing bilateral renal calculi again noted, the largest measuring 8 mm in RIGHT UPPER pole. Probable RIGHT renal cysts again noted. The adrenal glands and bladder are unremarkable. Stomach/Bowel: Stomach is within normal limits. No evidence of bowel wall thickening, distention, or inflammatory changes. Vascular/Lymphatic: A unchanged 3.9 cm infrarenal abdominal aortic aneurysm is again noted. Aortic and branch vessel atherosclerotic calcifications are noted. No enlarged lymph nodes identified. Reproductive: Status post hysterectomy. No adnexal masses. Other: No focal collection, ascites or pneumoperitoneum. Musculoskeletal: L4 vertebral augmentation changes now noted. L3 vertebral augmentation changes are again identified. No acute fracture identified. No suspicious bony abnormalities are present. Degenerative changes in the lumbar spine and hips again identified. IMPRESSION: 1. No acute abnormality within the abdomen or pelvis. 2. Unchanged 3.9 cm abdominal aortic aneurysm. Recommend followup by ultrasound in 2 years. This recommendation follows ACR consensus guidelines: White Paper of the ACR Incidental Findings Committee II on Vascular Findings. J Am Coll Radiol 2013; 10:789-794. Aortic aneurysm NOS (ICD10-I71.9) 3. L3 and L4 vertebral augmentation changes. No definite acute bony abnormality. 4. Unchanged splenomegaly 5. Mild RIGHT basilar atelectasis 6.  Aortic Atherosclerosis (ICD10-I70.0). Electronically Signed   By: Margarette Canada M.D.   On: 04/24/2019 11:19   Dg Lumbar Spine 2-3 Views  Result Date: 04/22/2019 CLINICAL DATA:  Vertebral augmentation EXAM: DG C-ARM 61-120 MIN; LUMBAR SPINE - 2-3 VIEW COMPARISON:  None. FINDINGS: Four fluoroscopic images are providing  showing vertebral augmentation at the  lumbar spine. IMPRESSION: Intraoperative fluoroscopy Electronically Signed   By: Ulyses Jarred M.D.   On: 04/22/2019 19:39   Dg Lumbar Spine 2-3 Views  Result Date: 04/19/2019 CLINICAL DATA:  Pain status post fall. EXAM: LUMBAR SPINE - 2-3 VIEW COMPARISON:  None. FINDINGS: The patient has undergone L3 kyphoplasty. Aortic calcifications are noted. There is a probable abdominal aortic aneurysm measuring approximately 4.5 cm. There is endplate sclerosis involving the L4 vertebral body. There is some sclerosis involving the inferior endplate of the L 2 vertebral body which is favored to be degenerative in etiology. There is grade 1 anterolisthesis of L3 on L4, likely degenerative. There are multilevel degenerative changes throughout the lumbar spine including advanced facet arthrosis at the lower lumbar segments. IMPRESSION: 1. There is mild endplate sclerosis involving the superior endplate of the L4 vertebral body. This is age indeterminate. Correlation with point tenderness and physical exam is recommended. If there is clinical concern for an acute fracture, follow-up with cross-sectional imaging is recommended. 2. Status post L3 kyphoplasty. 3. Probable abdominal aortic aneurysm measuring 4.5 cm. Follow-up with a nonemergent outpatient ultrasound is recommended for further evaluation. 4. Advanced degenerative changes throughout the lumbar spine. Electronically Signed   By: Constance Holster M.D.   On: 04/19/2019 01:32   Dg Lumbar Spine 2-3 Views  Result Date: 04/10/2019 CLINICAL DATA:  L3 kyphoplasty EXAM: LUMBAR SPINE - 2-3 VIEW COMPARISON:  None. FINDINGS: The patient has undergone reported L3 kyphoplasty. Cement is seen within the vertebral body. No definite extra vertebral cement noted on this exam. IMPRESSION: Status post reported L3 kyphoplasty. Electronically Signed   By: Constance Holster M.D.   On: 04/10/2019 20:50   Mr Lumbar Spine Wo Contrast  Result Date: 04/19/2019 CLINICAL DATA:  Back  pain.  Kyphoplasty at L3 on 04/10/2019. EXAM: MRI LUMBAR SPINE WITHOUT CONTRAST TECHNIQUE: Multiplanar, multisequence MR imaging of the lumbar spine was performed. No intravenous contrast was administered. COMPARISON:  04/19/2019 CT scan FINDINGS: Segmentation: The lowest lumbar type non-rib-bearing vertebra is labeled as L5. Alignment: 6 mm anterolisthesis L3 on L4 associated with a chronic right L3 pars defect. 5 mm degenerative anterolisthesis at L4-5. Vertebrae: Oblique fracture plane in the L3 vertebral body as seen on the CT scan, with methacrylate partially tracking along the transverse fracture plane. Overall there is about 15% loss of height of the L3 vertebral body. Type 2 degenerative endplate findings along the inferior endplate of L2. There is an oblique band of edema extending in the right anterior L4 vertebral body the probably related to a right anterior superior compression of the endplate. Disc desiccation throughout the lumbar spine with loss of disc height especially at L5-S1. Conus medullaris and cauda equina: Conus extends to the L1-2 level. Conus and cauda equina appear normal. Paraspinal and other soft tissues: Numerous bilateral renal cysts of varying complexity are incompletely assessed on today's exam. Some of these have low T2 and high T1 signal characteristics. There is paravertebral edema at L3 and L4, right greater than left. This is best appreciated on the sagittal inversion recovery weighted images. Disc levels: L1-2: Unremarkable. L2-3: Moderate to prominent left and moderate right foraminal stenosis and moderate central narrowing of the thecal sac due to facet and intervertebral spurring along with diffuse disc bulge. Suspected prior past laminectomies. L3-4: Moderate central narrowing of the thecal sac related to disc uncovering and facet arthropathy. Prior laminectomies. L4-5: Borderline bilateral subarticular lateral recess stenosis due to facet arthropathy and  subluxation.  L5-S1: No impingement. Intervertebral spurring extends inferiorly in the right neural foramen. IMPRESSION: 1. Primarily transverse L3 fracture, with some partial filling in the fracture plane with methacrylate and about 15% loss of height. This is roughly stable compared to 04/19/2019 CT scan. 2. There is superior endplate concavity and edema eccentric to the right in the L4 vertebral body suggesting a mild superior endplate compression fracture. 3. Continued anterolisthesis at L3 and L4; at L3 a chronic right pars defect is likely contributory. 4. Lumbar spondylosis and degenerative disc disease cause moderate impingement at L2-3 and L3-4, as noted above. 5. Postoperative findings including laminectomies at L2-3 and L3-4. Electronically Signed   By: Van Clines M.D.   On: 04/19/2019 15:15   Ct L-spine No Charge  Result Date: 04/19/2019 CLINICAL DATA:  Recent fall with fracture and kyphoplasty performed 04/10/2019. Increasing back pain. EXAM: CT LUMBAR SPINE WITHOUT CONTRAST TECHNIQUE: Multidetector CT imaging of the lumbar spine was performed without intravenous contrast administration. Multiplanar CT image reconstructions were also generated. COMPARISON:  Abdominal CT performed today. Plain films 04/10/2019. CT 02/23/2016 FINDINGS: Segmentation: 5 lumbar type vertebral bodies. Alignment: Slight anterolisthesis at L3-4 related to facet disease. Vertebrae: Fracture noted through the L3 vertebral body with fracture lines remain evident. Vertebral augmentation changes noted in the L3 vertebral body. There is superior endplate cortical irregularity at L4, best seen along the anterior superior corner. No significant loss in vertebral body height. Paraspinal and other soft tissues: Negative. Disc levels: Diffuse degenerative disc disease and facet disease. IMPRESSION: L3 vertebral body fracture status post vertebral augmentation. Fracture lines remain evident. Fracture through the superior endplate of L4, best  seen along the anterior superior corner. No loss in vertebral body height. Diffuse degenerative disc and facet disease. Electronically Signed   By: Rolm Baptise M.D.   On: 04/19/2019 03:50   Dg Chest Port 1 View  Result Date: 04/22/2019 CLINICAL DATA:  Shortness of breath. EXAM: PORTABLE CHEST 1 VIEW COMPARISON:  Chest x-ray dated 02/23/2016 and chest CT dated 06/07/2018 and CT scan of the abdomen dated 04/19/2019 FINDINGS: There is tortuosity and aneurysmal dilatation of the descending thoracic aorta as demonstrated on multiple prior exams. Overall heart size is normal. Slight linear atelectasis at the right lung base laterally. No infiltrates. No acute bone abnormality. Aortic atherosclerosis. IMPRESSION: 1. Slight atelectasis at the right lung base. This is essentially unchanged since the prior CT scan of 04/19/2019. 2. Chronic aneurysmal dilatation of descending thoracic aorta. 3.  Aortic Atherosclerosis (ICD10-I70.0). Electronically Signed   By: Lorriane Shire M.D.   On: 04/22/2019 12:47   Dg C-arm 1-60 Min  Result Date: 04/22/2019 CLINICAL DATA:  Vertebral augmentation EXAM: DG C-ARM 61-120 MIN; LUMBAR SPINE - 2-3 VIEW COMPARISON:  None. FINDINGS: Four fluoroscopic images are providing showing vertebral augmentation at the lumbar spine. IMPRESSION: Intraoperative fluoroscopy Electronically Signed   By: Ulyses Jarred M.D.   On: 04/22/2019 19:39   Dg C-arm 1-60 Min  Result Date: 04/10/2019 CLINICAL DATA:  L3 kyphoplasty EXAM: DG C-ARM 61-120 MIN COMPARISON:  None. FINDINGS: The patient appears to have undergone L3 kyphoplasty. The total fluoroscopy time was 1.9 minutes. Two images were saved. IMPRESSION: Status post reported L3 kyphoplasty. Electronically Signed   By: Constance Holster M.D.   On: 04/10/2019 20:50   Ct Renal Stone Study  Result Date: 04/19/2019 CLINICAL DATA:  Fall last night. L4 fracture. Increasing back pain. EXAM: CT ABDOMEN AND PELVIS WITHOUT CONTRAST TECHNIQUE: Multidetector  CT imaging of  the abdomen and pelvis was performed following the standard protocol without IV contrast. COMPARISON:  Plain film 04/19/2019 and 04/10/2019.  CT 02/23/2016. FINDINGS: Lower chest: Mild cardiomegaly. Aortic atherosclerosis. No acute abnormality. Hepatobiliary: No focal hepatic abnormality. Gallbladder unremarkable. Pancreas: No focal abnormality or ductal dilatation. Spleen: Splenomegaly with a craniocaudal length of the spleen measuring 18 cm. This is stable when compared to prior CT from 2017. Adrenals/Urinary Tract: Multiple right renal cysts. 6 mm nonobstructing stone in the lower pole of the right kidney. Renovascular calcifications. Punctate nonobstructing stone in the midpole of the left kidney. No hydronephrosis. Urinary bladder and adrenal glands unremarkable. Stomach/Bowel: Stomach, large and small bowel grossly unremarkable. Vascular/Lymphatic: Aortic atherosclerosis. 3.9 cm abdominal aortic aneurysm. No adenopathy. Reproductive: Prior hysterectomy.  No adnexal masses. Other: No free fluid or free air. Musculoskeletal: Fracture involving the L3 vertebral body with changes of vertebroplasty. Fracture lines remain evident. Probable fracture through the superior endplate of L4 without loss of vertebral body height. Degenerative facet disease throughout the lumbar spine. Slight anterolisthesis at L3-4, stable. IMPRESSION: L3 fracture, status post vertebroplasty. Fracture lines remain evident. Slight irregularity along the superior endplate of L4 may reflect mild fracture without significant loss of vertebral body height. No retropulsed fracture fragments. Bilateral nephrolithiasis.  No hydronephrosis. Splenomegaly, stable since 2017. Diffuse aortoiliac atherosclerosis. 3.9 cm abdominal aortic aneurysm. Recommend followup by ultrasound in 2 years. This recommendation follows ACR consensus guidelines: White Paper of the ACR Incidental Findings Committee II on Vascular Findings. J Am Coll Radiol  2013; 10:789-794. Aortic aneurysm NOS (ICD10-I71.9) Electronically Signed   By: Rolm Baptise M.D.   On: 04/19/2019 03:47    Assessment and plan- Patient is a 84 y.o. female admitted for back pain after kyphoplasty and being treated for UTI.  Oncology consulted for leukocytosis  Patient has chronic leukocytosis With a white count ranging between 11-15.  Differential mainly shows neutrophilia along with left shift.  She has seen hematology oncology at [facility] in the past and underwent a bone marrow biopsy in July 2019.  Prior to that she had a bone marrow in 2015 and she had the diagnosis of MDS/MPN and was on Jakafi which was stopped after her visit in August 2019 and watchful monitoring was recommended.  At this time her leukocytosis is resolving higher than her baseline which may be secondary to ongoing UTI.  Recommend checking a differential on the CBC, pathologic smear review and I will obtain flow cytometry and BCR ABL testing.  Patient should be following up with hematology oncology at [facility] upon discharge and she has already had her work-up there.  I will reach out to them from my side after the patient discharge as well  Thank you for this kind referral and the opportunity to participate in the care of this patient   Visit Diagnosis Leukocytosis Splenomegaly  Dr. Randa Evens, MD, MPH [facility] at [facility] [phone] 04/25/2019 10:07 AM

## 2019-04-26 ENCOUNTER — Inpatient Hospital Stay: Payer: Medicare HMO

## 2019-04-26 LAB — BASIC METABOLIC PANEL
Anion gap: 8 (ref 5–15)
BUN: 13 mg/dL (ref 8–23)
CO2: 25 mmol/L (ref 22–32)
Calcium: 9.8 mg/dL (ref 8.9–10.3)
Chloride: 106 mmol/L (ref 98–111)
Creatinine, Ser: 1.1 mg/dL — ABNORMAL HIGH (ref 0.44–1.00)
GFR calc Af Amer: 54 mL/min — ABNORMAL LOW (ref >60)
GFR calc non Af Amer: 47 mL/min — ABNORMAL LOW (ref >60)
Glucose, Bld: 92 mg/dL (ref 70–99)
Potassium: 3.4 mmol/L — ABNORMAL LOW (ref 3.5–5.1)
Sodium: 139 mmol/L (ref 135–145)

## 2019-04-26 LAB — CBC WITH DIFFERENTIAL/PLATELET
Abs Immature Granulocytes: 2.19 10*3/uL — ABNORMAL HIGH (ref 0.00–0.07)
Basophils Absolute: 0.4 10*3/uL — ABNORMAL HIGH (ref 0.0–0.1)
Basophils Relative: 2 %
Eosinophils Absolute: 0.8 10*3/uL — ABNORMAL HIGH (ref 0.0–0.5)
Eosinophils Relative: 4 %
HCT: 34.9 % — ABNORMAL LOW (ref 36.0–46.0)
Hemoglobin: 11 g/dL — ABNORMAL LOW (ref 12.0–15.0)
Immature Granulocytes: 12 %
Lymphocytes Relative: 10 %
Lymphs Abs: 1.9 10*3/uL (ref 0.7–4.0)
MCH: 27.3 pg (ref 26.0–34.0)
MCHC: 31.5 g/dL (ref 30.0–36.0)
MCV: 86.6 fL (ref 80.0–100.0)
Monocytes Absolute: 1 10*3/uL (ref 0.1–1.0)
Monocytes Relative: 5 %
Neutro Abs: 12.8 10*3/uL — ABNORMAL HIGH (ref 1.7–7.7)
Neutrophils Relative %: 67 %
Platelets: 276 10*3/uL (ref 150–400)
RBC: 4.03 MIL/uL (ref 3.87–5.11)
RDW: 20 % — ABNORMAL HIGH (ref 11.5–15.5)
Smear Review: NORMAL
WBC: 19 10*3/uL — ABNORMAL HIGH (ref 4.0–10.5)
nRBC: 0.2 % (ref 0.0–0.2)

## 2019-04-26 LAB — MAGNESIUM: Magnesium: 2 mg/dL (ref 1.7–2.4)

## 2019-04-26 MED ORDER — POTASSIUM CHLORIDE CRYS ER 20 MEQ PO TBCR
40.0000 meq | EXTENDED_RELEASE_TABLET | Freq: Once | ORAL | Status: AC
  Administered 2019-04-26: 10:00:00 40 meq via ORAL
  Filled 2019-04-26: qty 2

## 2019-04-26 MED ORDER — ZINC OXIDE 40 % EX OINT
TOPICAL_OINTMENT | CUTANEOUS | Status: DC | PRN
  Administered 2019-04-26: 15:00:00 via TOPICAL
  Filled 2019-04-26: qty 113

## 2019-04-26 NOTE — Progress Notes (Signed)
Pt c/o LLQ pain. MD mayo notified, orders received for KUB. Pt also having troubling urinating, bladder scan 396 ml, per MD continue to monitor.

## 2019-04-26 NOTE — Progress Notes (Signed)
PT Cancellation Note  Patient Details Name: Veronica Hubbard MRN: [id number] DOB: [birth year]   Cancelled Treatment:    Reason Eval/Treat Not Completed: Fatigue/lethargy limiting ability to participate   Offered and encouraged session.  Pt sleeping upon arrival and awoke with gentle voice.  Pt refused session stating she was too tired.  Will try to offer again this pm as schedule allows.   Chesley Noon 04/26/2019, 12:54 PM

## 2019-04-26 NOTE — Progress Notes (Signed)
[place] at [place] NAME: Jennice Renegar    MR#:  [id number]  DATE OF BIRTH:  May 11, 1935  SUBJECTIVE:   She states she is doing better today.  She had just received some pain medicine this morning, so she was not having any back pain.  She endorses some occasional pain that radiates down her entire left leg.  REVIEW OF SYSTEMS:  Review of Systems  Constitutional: Negative for chills and fever.  HENT: Negative for hearing loss and tinnitus.   Eyes: Negative for blurred vision and double vision.  Respiratory: Negative for cough and hemoptysis.   Cardiovascular: Negative for chest pain and palpitations.  Gastrointestinal: Negative for heartburn and nausea.  Genitourinary: Negative for dysuria and urgency.  Musculoskeletal: Positive for back pain. Negative for myalgias and neck pain.  Skin: Negative for itching and rash.  Neurological: Negative for dizziness and headaches.  Psychiatric/Behavioral: Negative for depression and hallucinations.    DRUG ALLERGIES:   Allergies  Allergen Reactions  . Aspirin Shortness Of Breath  . Celecoxib Shortness Of Breath  . Iodinated Diagnostic Agents Other (See Comments)    Reaction:  Decreased kidney function  . Losartan Itching  . Morphine Nausea And Vomiting  . Verapamil Itching   VITALS:  Blood pressure 112/69, pulse 70, temperature 98.3 F (36.8 C), temperature source Oral, resp. rate 16, height 5' 3"  (1.6 m), weight 65.3 kg, SpO2 96 %. PHYSICAL EXAMINATION:    Physical Exam  Constitutional: She is oriented to person, place, and time. She appears well-developed.  HENT:  Head: Normocephalic and atraumatic.  Right Ear: External ear normal.  Eyes: Pupils are equal, round, and reactive to light. Conjunctivae are normal. Right eye exhibits no discharge.  Neck: Normal range of motion. Neck supple. No tracheal deviation present.  Cardiovascular: Normal rate, regular rhythm and normal heart  sounds.  Respiratory: Effort normal. She has no wheezes.  GI: Soft. Bowel sounds are normal. She exhibits no distension. There is no abdominal tenderness.  Musculoskeletal: Normal range of motion.        General: No edema.     Comments: +diffuse tenderness to palpation of the lumbar spine  Neurological: She is alert and oriented to person, place, and time.  Skin: Skin is warm. She is not diaphoretic. No erythema.  Psychiatric: She has a normal mood and affect.   LABORATORY PANEL:  Female CBC Recent Labs  Lab 04/26/19 0455  WBC 19.0*  HGB 11.0*  HCT 34.9*  PLT 276   ------------------------------------------------------------------------------------------------------------------ Chemistries  Recent Labs  Lab 04/26/19 0455  NA 139  K 3.4*  CL 106  CO2 25  GLUCOSE 92  BUN 13  CREATININE 1.10*  CALCIUM 9.8  MG 2.0   RADIOLOGY:  No results found. ASSESSMENT AND PLAN:   Intractable back pain due to L4 compression fracture-s/p kyphoplasty 04/22/2019.  Patient also with recent kyphoplasty 04/10/2019. -Pain control -PT recommending SNF- plan for discharge to [facility], likely tomorrow  Acute urinary retention- improving.  Likely multifactorial due to pain, narcotics, and compression fracture. -Seen by urology this admission, who recommended PVRs and placement of Foley if PVR > 500 cc -Continue to monitor  UTI- urine culture growing Enterococcus faecalis. -Patient has completed a 7-day course of antibiotics  Oral thrush-improving -Continue nystatin swish and swallow  Hypertension- BP is well controlled -Continue home BP meds  Hypokalemia -Replete and recheck  Hypothyroidism- TSH 9.749 this admission. -Synthroid dose increased from 112 mcg daily to 150  mcg daily -Needs repeat thyroid function testing as an outpatient  Asthma-stable, no signs of acute exacerbation -Continue home inhalers  Depression-stable -Continue Celexa  Acute on chronic leukocytosis- WBC  improving.  Follows with [facility] as an outpatient.  Underwent bone marrow biopsy in July 2019.  Has a history of MDS/MPN and was on Jakafi in the past. -Work-up per heme-onc -Needs to follow-up with hematologist at [facility] as an outpatient  DVT prophylaxis: Lovenox  Plan for likely discharge to SNF tomorrow.  All the records are reviewed and case discussed with Care Management/Social Worker. Management plans discussed with the patient, family and they are in agreement.  CODE STATUS: Full Code  TOTAL TIME TAKING CARE OF THIS PATIENT: 38 minutes.   More than 50% of the time was spent in counseling/coordination of care: YES  POSSIBLE D/C IN 1-2 DAYS, DEPENDING ON CLINICAL CONDITION.   Berna Spare Malyiah Fellows M.D on 04/26/2019 at 1:34 PM  Between 7am to 6pm - Pager [phone]  After 6pm go to [email or web address] - Proofreader  [facility] [place] [facility]  Office  [phone]  CC: Primary care physician; Zhou-Talbert, Elwyn Lade, MD  Note: This dictation was prepared with Dragon dictation along with smaller phrase technology. Any transcriptional errors that result from this process are unintentional.

## 2019-04-27 LAB — CBC
HCT: 37.5 % (ref 36.0–46.0)
Hemoglobin: 11.8 g/dL — ABNORMAL LOW (ref 12.0–15.0)
MCH: 27.1 pg (ref 26.0–34.0)
MCHC: 31.5 g/dL (ref 30.0–36.0)
MCV: 86.2 fL (ref 80.0–100.0)
Platelets: 342 10*3/uL (ref 150–400)
RBC: 4.35 MIL/uL (ref 3.87–5.11)
RDW: 19.8 % — ABNORMAL HIGH (ref 11.5–15.5)
WBC: 19.4 10*3/uL — ABNORMAL HIGH (ref 4.0–10.5)
nRBC: 0.3 % — ABNORMAL HIGH (ref 0.0–0.2)

## 2019-04-27 LAB — BASIC METABOLIC PANEL
Anion gap: 7 (ref 5–15)
BUN: 10 mg/dL (ref 8–23)
CO2: 24 mmol/L (ref 22–32)
Calcium: 9.9 mg/dL (ref 8.9–10.3)
Chloride: 107 mmol/L (ref 98–111)
Creatinine, Ser: 1.02 mg/dL — ABNORMAL HIGH (ref 0.44–1.00)
GFR calc Af Amer: 59 mL/min — ABNORMAL LOW (ref >60)
GFR calc non Af Amer: 51 mL/min — ABNORMAL LOW (ref >60)
Glucose, Bld: 96 mg/dL (ref 70–99)
Potassium: 3.6 mmol/L (ref 3.5–5.1)
Sodium: 138 mmol/L (ref 135–145)

## 2019-04-27 LAB — SARS CORONAVIRUS 2 BY RT PCR (HOSPITAL ORDER, PERFORMED IN ~~LOC~~ HOSPITAL LAB): SARS Coronavirus 2: NEGATIVE

## 2019-04-27 MED ORDER — DOCUSATE SODIUM 100 MG PO CAPS: 100.0000 mg | ORAL_CAPSULE | Freq: Two times a day (BID) | ORAL | 0 refills | Status: DC

## 2019-04-27 MED ORDER — OXYCODONE HCL 15 MG PO TABS: 7.5000 mg | ORAL_TABLET | Freq: Four times a day (QID) | ORAL | 0 refills | Status: DC | PRN

## 2019-04-27 MED ORDER — POLYETHYLENE GLYCOL 3350 17 G PO PACK: 17.0000 g | PACK | Freq: Every day | ORAL | 0 refills | Status: AC | PRN

## 2019-04-27 MED ORDER — LEVOTHYROXINE SODIUM 150 MCG PO TABS: 150.0000 ug | ORAL_TABLET | Freq: Every day | ORAL | 0 refills | Status: DC

## 2019-04-27 MED ORDER — ACETAMINOPHEN 325 MG PO TABS: 650.0000 mg | ORAL_TABLET | Freq: Four times a day (QID) | ORAL | 0 refills | Status: AC | PRN

## 2019-04-27 MED ORDER — HYDRALAZINE HCL 50 MG PO TABS: 50.0000 mg | ORAL_TABLET | Freq: Three times a day (TID) | ORAL | 0 refills | Status: AC

## 2019-04-27 NOTE — Progress Notes (Signed)
Report called to [facility], EMS called for transportation.

## 2019-04-27 NOTE — Discharge Summary (Signed)
[place] at [place] NAME: Veronica Hubbard    MR#:  [id number]  DATE OF BIRTH:  09-Nov-1934  DATE OF ADMISSION:  04/18/2019   ADMITTING PHYSICIAN: Harrie Foreman, MD  DATE OF DISCHARGE: 04/27/19  PRIMARY CARE PHYSICIAN: Zhou-Talbert, Elwyn Lade, MD   ADMISSION DIAGNOSIS:  Back pain [M54.9] Intractable back pain [M54.9] Volume depletion [E86.9] Leukocytosis, unspecified type [D72.829] DISCHARGE DIAGNOSIS:  Active Problems:   Intractable pain  SECONDARY DIAGNOSIS:   Past Medical History:  Diagnosis Date  . Asthma   . Cancer ([place]) 02/2018   bone marrow cancer  . Depression   . GERD (gastroesophageal reflux disease)   . Hypertension   . Hypothyroidism   . Personal history of chemotherapy   . Spinal stenosis    HOSPITAL COURSE:   Veronica Hubbard is an 84 year old female who presented to the ED with severe low back pain. She had a recent L3 kyphoplasty on 04/10/19. MRI of her lumbar spine was performed and showed a new L4 compression fracture. She was admitted for further management.  Intractable back pain due to L4 compression fracture-s/p kyphoplasty 04/22/2019.  Patient also with recent kyphoplasty 04/10/2019 for L3 compression fracture. -Pain improved -Continue tylenol prn mild to moderate pain and oxycodone prn severe pain on discharge -PT recommended SNF -Needs to f/u with Dr. Rudene Christians (ortho) as an outpatient in the next 1-2 days.  Acute urinary retention-  Likely multifactorial due to pain, narcotics, and compression fracture. -Urinary retention improved and patient was able to void 9 times in the 24 hours prior to discharge -Seen by urology this admission, who recommended frequent PVRs and placement of Foley if PVR > 500 cc -Needs to f/u with urology as an outpatient  UTI- urine culture growing Enterococcus faecalis. -Patient has completed a 7-day course of antibiotics  Oral thrush- improved -Completed a 7 day course of  nystatin swish and swallow  Hypertension- BPs well controlled -Continued home BP meds  Hypothyroidism- TSH 9.749 this admission. -Synthroid dose increased from 112 mcg daily to 150 mcg daily -Needs repeat thyroid function testing as an outpatient  Asthma-stable, no signs of acute exacerbation -Continued home inhalers  Depression-stable -Continued Celexa  Acute on chronic leukocytosis- WBC improving.  Follows with [facility] as an outpatient.  Underwent bone marrow biopsy in July 2019.  Has a history of MDS/MPN and was on Jakafi in the past. -Work-up per heme-onc -Needs to follow-up with hematologist at [facility] as an outpatient  AAA- incidental finding of 3.9cm AAA, seen on CT renal stone study -Needs f/u US in 2 years  DISCHARGE CONDITIONS:  Acute L3 and L4 compression fractures s/p kyphoplasty Acute urinary retention Hypertension Hypothyroidism Asthma Depression Acute on chronic leukocytosis CONSULTS OBTAINED:  Treatment Team:  Cleon Gustin, MD Hessie Knows, MD Sindy Guadeloupe, MD DRUG ALLERGIES:   Allergies  Allergen Reactions  . Aspirin Shortness Of Breath  . Celecoxib Shortness Of Breath  . Iodinated Diagnostic Agents Other (See Comments)    Reaction:  Decreased kidney function  . Losartan Itching  . Morphine Nausea And Vomiting  . Verapamil Itching   DISCHARGE MEDICATIONS:   Allergies as of 04/27/2019      Reactions   Aspirin Shortness Of Breath   Celecoxib Shortness Of Breath   Iodinated Diagnostic Agents Other (See Comments)   Reaction:  Decreased kidney function   Losartan Itching   Morphine Nausea And Vomiting   Verapamil Itching  Medication List    STOP taking these medications   oxyCODONE HCl 7.5 MG Taba Replaced by: oxyCODONE 15 MG immediate release tablet     TAKE these medications   acetaminophen 325 MG tablet Commonly known as: TYLENOL Take 2 tablets (650 mg total) by mouth every 6 (six) hours as needed for mild pain (or Fever  >/= 101).   allopurinol 100 MG tablet Commonly known as: ZYLOPRIM Take 200 mg by mouth daily.   azelastine 0.1 % nasal spray Commonly known as: ASTELIN Place 1 spray into both nostrils daily. Use in each nostril as directed   budesonide 0.5 MG/2ML nebulizer solution Commonly known as: PULMICORT Take 0.5 mg by nebulization 2 (two) times daily.   citalopram 20 MG tablet Commonly known as: CELEXA Take 20 mg by mouth daily.   cyclobenzaprine 5 MG tablet Commonly known as: FLEXERIL Take 5-10 mg by mouth at bedtime as needed for muscle spasms.   docusate sodium 100 MG capsule Commonly known as: COLACE Take 1 capsule (100 mg total) by mouth 2 (two) times daily.   ezetimibe 10 MG tablet Commonly known as: ZETIA Take 10 mg by mouth at bedtime.   fluticasone 50 MCG/ACT nasal spray Commonly known as: FLONASE Place 1 spray into both nostrils 2 (two) times a day.   Fluticasone-Umeclidin-Vilant 100-62.5-25 MCG/INH Aepb Inhale 1 puff into the lungs daily.   hydrALAZINE 50 MG tablet Commonly known as: APRESOLINE Take 1 tablet (50 mg total) by mouth every 8 (eight) hours.   ipratropium-albuterol 0.5-2.5 (3) MG/3ML Soln Commonly known as: DUONEB Take 3 mLs by nebulization 4 (four) times daily as needed.   isosorbide mononitrate 30 MG 24 hr tablet Commonly known as: IMDUR Take 30 mg by mouth daily.   levothyroxine 150 MCG tablet Commonly known as: SYNTHROID Take 1 tablet (150 mcg total) by mouth daily before breakfast. Start taking on: April 28, 2019 What changed:   medication strength  how much to take   Melatonin 5 MG Tabs Take 5 mg by mouth at bedtime.   nystatin 100000 UNIT/ML suspension Commonly known as: MYCOSTATIN Take 4 mLs by mouth 4 (four) times daily. Swish and hold in mouth for as long as possible then swallow   ondansetron 4 MG disintegrating tablet Commonly known as: ZOFRAN-ODT Take 4 mg by mouth every 8 (eight) hours as needed for nausea or vomiting.    oxyCODONE 15 MG immediate release tablet Commonly known as: ROXICODONE Take 0.5 tablets (7.5 mg total) by mouth every 6 (six) hours as needed for moderate pain. Replaces: oxyCODONE HCl 7.5 MG Taba   pantoprazole 40 MG tablet Commonly known as: PROTONIX Take 40 mg by mouth 2 (two) times daily.   polyethylene glycol 17 g packet Commonly known as: MIRALAX / GLYCOLAX Take 17 g by mouth daily as needed.   Vitamin D3 25 MCG (1000 UT) Caps Take 1,000 Units by mouth daily.        DISCHARGE INSTRUCTIONS:  1. F/u with PCP in 5 days 2. F/u with urologist in 1-2 weeks 3. F/u with hematologist in 2 weeks 4. F/u with orthopedic surgeon in 1-2 weeks 5. Synthroid dose increased to 15mg daily 6. Needs thyroid function testing repeated as an outpatient DIET:  Cardiac diet DISCHARGE CONDITION:  Stable ACTIVITY:  Activity as tolerated OXYGEN:  Home Oxygen: No.  Oxygen Delivery: room air DISCHARGE LOCATION:  nursing home   If you experience worsening of your admission symptoms, develop shortness of breath, life threatening emergency, suicidal or  homicidal thoughts you must seek medical attention immediately by calling 911 or calling your MD immediately  if symptoms less severe.  You Must read complete instructions/literature along with all the possible adverse reactions/side effects for all the Medicines you take and that have been prescribed to you. Take any new Medicines after you have completely understood and accpet all the possible adverse reactions/side effects.   Please note  You were cared for by a hospitalist during your hospital stay. If you have any questions about your discharge medications or the care you received while you were in the hospital after you are discharged, you can call the unit and asked to speak with the hospitalist on call if the hospitalist that took care of you is not available. Once you are discharged, your primary care physician will handle any further  medical issues. Please note that NO REFILLS for any discharge medications will be authorized once you are discharged, as it is imperative that you return to your primary care physician (or establish a relationship with a primary care physician if you do not have one) for your aftercare needs so that they can reassess your need for medications and monitor your lab values.    On the day of Discharge:  VITAL SIGNS:  Blood pressure 129/77, pulse 83, temperature 99 F (37.2 C), temperature source Oral, resp. rate 17, height 5' 3" (1.6 m), weight 69 kg, SpO2 94 %. PHYSICAL EXAMINATION:  GENERAL:  84 y.o.-year-old patient lying in the bed with no acute distress.  EYES: Pupils equal, round, reactive to light and accommodation. No scleral icterus. Extraocular muscles intact.  HEENT: Head atraumatic, normocephalic. Oropharynx and nasopharynx clear.  NECK:  Supple, no jugular venous distention. No thyroid enlargement, no tenderness.  LUNGS: Normal breath sounds bilaterally, no wheezing, rales,rhonchi or crepitation. No use of accessory muscles of respiration.  CARDIOVASCULAR: RRR, S1, S2 normal. No murmurs, rubs, or gallops.  ABDOMEN: Soft, non-tender, non-distended. Bowel sounds present. No organomegaly or mass.  BACK: decreased ROM secondary to pain, +diffuse tenderness to palpation EXTREMITIES: No pedal edema, cyanosis, or clubbing.  NEUROLOGIC: Cranial nerves II through XII are intact. +global weakness. Sensation intact. Gait not checked.  PSYCHIATRIC: The patient is alert and oriented x 3.  SKIN: No obvious rash, lesion, or ulcer.  DATA REVIEW:   CBC Recent Labs  Lab 04/27/19 0737  WBC 19.4*  HGB 11.8*  HCT 37.5  PLT 342    Chemistries  Recent Labs  Lab 04/26/19 0455 04/27/19 0737  NA 139 138  K 3.4* 3.6  CL 106 107  CO2 25 24  GLUCOSE 92 96  BUN 13 10  CREATININE 1.10* 1.02*  CALCIUM 9.8 9.9  MG 2.0  --      Microbiology Results  Results for orders placed or performed  during the hospital encounter of 04/18/19  Novel Coronavirus,NAA,(SEND-OUT TO REF LAB - TAT 24-48 hrs); Hosp Order     Status: None   Collection Time: 04/19/19  4:38 AM   Specimen: Nasopharyngeal Swab; Respiratory  Result Value Ref Range Status   SARS-CoV-2, NAA NOT DETECTED NOT DETECTED Final    Comment: (NOTE) Testing was performed using the cobas(R) SARS-CoV-2 test. This test was developed and its performance characteristics determined by [employer]. This test has not been FDA cleared or approved. This test has been authorized by FDA under an Emergency Use Authorization (EUA). This test is only authorized for the duration of time the declaration that circumstances exist justifying the authorization of  the emergency use of in vitro diagnostic tests for detection of SARS-CoV-2 virus and/or diagnosis of COVID-19 infection under section 564(b)(1) of the Act, 21 U.S.C. 916BWG-6(K)(5), unless the authorization is terminated or revoked sooner. When diagnostic testing is negative, the possibility of a false negative result should be considered in the context of a patient's recent exposures and the presence of clinical signs and symptoms consistent with COVID-19. An individual without symptoms of COVID-19 and who is not shedding SARS-CoV-2 virus would expect to have  a negative (not detected) result in this assay. Performed At: [facility] [address] [place], [place] [id number] Rush Farmer MD LT:[id number]    [place]  Final    Comment: Performed at [facility], [address] [place], [place] [address]  Urine Culture     Status: Abnormal   Collection Time: 04/19/19 11:07 AM   Specimen: Urine, Catheterized  Result Value Ref Range Status   Specimen Description   Final    URINE, CATHETERIZED Performed at [facility], [address] [place], [place] [address]    Special Requests   Final    Normal Performed at  [facility], [address] [place], [place] [address]    Culture >=100,000 COLONIES/mL ENTEROCOCCUS FAECALIS (A)  Final   Report Status 04/21/2019 FINAL  Final   Organism ID, Bacteria ENTEROCOCCUS FAECALIS (A)  Final      Susceptibility   Enterococcus faecalis - MIC*    AMPICILLIN <=2 SENSITIVE Sensitive     LEVOFLOXACIN >=8 RESISTANT Resistant     NITROFURANTOIN <=16 SENSITIVE Sensitive     VANCOMYCIN 1 SENSITIVE Sensitive     * >=100,000 COLONIES/mL ENTEROCOCCUS FAECALIS    RADIOLOGY:  Dg Abd 1 View  Result Date: 04/26/2019 CLINICAL DATA:  Kyphoplasty performed 04/22/2019 now with worsening back pain inability to void. EXAM: ABDOMEN - 1 VIEW COMPARISON:  CT abdomen pelvis-04/24/2019; intraoperative images during kyphoplasty-04/22/2019 FINDINGS: Paucity of bowel gas without evidence of enteric obstruction. No pneumoperitoneum, pneumatosis or portal venous gas. The approximately 0.9 cm calcification overlying the right mid hemiabdomen correlates with the known nonobstructing right-sided renal stone demonstrated on recent abdominal CT. Punctate phleboliths overlie the left hemipelvis. Post L3 and L4 cement augmentation, incompletely evaluated. Degenerative change of the lower lumbar spine is suspected. Moderate degenerative change the bilateral hips is suspected. IMPRESSION: 1. Paucity of bowel gas without evidence of enteric obstruction. 2. Post L3 and L4 cement augmentation, incompletely evaluated. 3. Right-sided nephrolithiasis as demonstrated on recent abdominal CT. Electronically Signed   By: Sandi Mariscal M.D.   On: 04/26/2019 17:31     Management plans discussed with the patient, family and they are in agreement.  CODE STATUS: Full Code   TOTAL TIME TAKING CARE OF THIS PATIENT: 45 minutes.    Berna Spare Mayo M.D on 04/27/2019 at 8:35 AM  Between 7am to 6pm - Pager - [phone]  After 6pm go to [email or web address] - Proofreader  [facility] [place] [facility]   Office  [phone]  CC: Primary care physician; Zhou-Talbert, Elwyn Lade, MD   Note: This dictation was prepared with Dragon dictation along with smaller phrase technology. Any transcriptional errors that result from this process are unintentional.

## 2019-04-27 NOTE — Progress Notes (Signed)
EMS here to transport pt to [facility]. Pts belongings sent with pt.

## 2019-04-27 NOTE — TOC Transition Note (Addendum)
Transition of Care [facility]) - CM/SW Discharge Note   Patient Details  Name: Veronica Hubbard MRN: [id number] Date of Birth: 12/04/1934  [facility] of [facility]) CM/SW Contact:  Ross Ludwig, LCSW Phone Number: 04/27/2019, 11:59 AM   Clinical Narrative:     CSW contacted [facility] [employer], they have insurance authorization, patient should be able to discharge today pending her Covid 19 results.  Patient to be d/c'ed today to [facility] [place] room 805.  Patient and family agreeable to plans will transport via ems RN to call report [phone].  CSW contacted patient's sister [name] at [phone], and informed her that patient will be discharging today.  12:05pm  CSW received confirmation that patient's Covid 19 results came back negative.  Final next level of care: [place] Barriers to Discharge: No Barriers Identified   Patient Goals and CMS Choice Patient states their goals for this hospitalization and ongoing recovery are:: To go to SNF for short term rehab, then return back home with home health. CMS Medicare.gov Compare Post Acute Care list provided to:: Patient Choice offered to / list presented to : Patient  Discharge Placement PASRR number recieved: 04/20/19            Patient chooses bed at: [facility] [place] Patient to be transferred to facility by: [facility] EMS Name of family member notified: Patient's sister [name] [phone] Patient and family notified of of transfer: 04/27/19  Discharge Plan and Services   Discharge Planning Services: CM Consult Post Acute Care Choice: Home Health          DME Arranged: N/A DME Agency: NA       HH Arranged: PT, Nurse's Aide, RN [place] Agency: [place] ([facility]) Date HH Agency Contacted: 04/20/19 Time [place]: [phone] Representative spoke with at [place]: [place] ([place]) Interventions     Readmission Risk  Interventions No flowsheet data found.

## 2019-04-28 LAB — COMP PANEL: LEUKEMIA/LYMPHOMA: Immunophenotypic Profile: 0

## 2019-04-29 ENCOUNTER — Telehealth: Payer: Self-pay | Admitting: *Deleted

## 2019-04-29 NOTE — Telephone Encounter (Signed)
Per your request to send info to her oncologist/hematologist for continuity of care. In reviewing the records from [facility] she did have BM bx but after that she saw Dr. Junius Roads in [name] and I called the office today [phone] and confirmed that sees this doctor on reg. Basis and she said yes and I faxed notes to their office [phone].

## 2019-05-02 ENCOUNTER — Emergency Department
Admission: EM | Admit: 2019-05-02 | Discharge: 2019-05-02 | Disposition: A | Discharge status: Home / Self Care | Payer: Medicare HMO | Attending: Emergency Medicine | Admitting: Emergency Medicine

## 2019-05-02 ENCOUNTER — Other Ambulatory Visit: Payer: Self-pay

## 2019-05-02 ENCOUNTER — Emergency Department: Payer: Medicare HMO

## 2019-05-02 ENCOUNTER — Encounter: Payer: Self-pay | Admitting: Emergency Medicine

## 2019-05-02 DIAGNOSIS — I10 Essential (primary) hypertension: Secondary | ICD-10-CM | POA: Insufficient documentation

## 2019-05-02 DIAGNOSIS — R0602 Shortness of breath: Secondary | ICD-10-CM | POA: Diagnosis not present

## 2019-05-02 DIAGNOSIS — Z20828 Contact with and (suspected) exposure to other viral communicable diseases: Secondary | ICD-10-CM | POA: Diagnosis not present

## 2019-05-02 DIAGNOSIS — E039 Hypothyroidism, unspecified: Secondary | ICD-10-CM | POA: Diagnosis not present

## 2019-05-02 DIAGNOSIS — J45909 Unspecified asthma, uncomplicated: Secondary | ICD-10-CM | POA: Diagnosis not present

## 2019-05-02 DIAGNOSIS — M549 Dorsalgia, unspecified: Secondary | ICD-10-CM | POA: Insufficient documentation

## 2019-05-02 DIAGNOSIS — Z79899 Other long term (current) drug therapy: Secondary | ICD-10-CM | POA: Insufficient documentation

## 2019-05-02 DIAGNOSIS — Z87891 Personal history of nicotine dependence: Secondary | ICD-10-CM | POA: Insufficient documentation

## 2019-05-02 LAB — CBC WITH DIFFERENTIAL/PLATELET
Abs Immature Granulocytes: 5.27 10*3/uL — ABNORMAL HIGH (ref 0.00–0.07)
Basophils Absolute: 0.8 10*3/uL — ABNORMAL HIGH (ref 0.0–0.1)
Basophils Relative: 3 %
Eosinophils Absolute: 0.9 10*3/uL — ABNORMAL HIGH (ref 0.0–0.5)
Eosinophils Relative: 3 %
HCT: 39.6 % (ref 36.0–46.0)
Hemoglobin: 12.6 g/dL (ref 12.0–15.0)
Immature Granulocytes: 19 %
Lymphocytes Relative: 8 %
Lymphs Abs: 2.1 10*3/uL (ref 0.7–4.0)
MCH: 27.5 pg (ref 26.0–34.0)
MCHC: 31.8 g/dL (ref 30.0–36.0)
MCV: 86.3 fL (ref 80.0–100.0)
Monocytes Absolute: 1.3 10*3/uL — ABNORMAL HIGH (ref 0.1–1.0)
Monocytes Relative: 5 %
Neutro Abs: 17.4 10*3/uL — ABNORMAL HIGH (ref 1.7–7.7)
Neutrophils Relative %: 62 %
Platelets: 421 10*3/uL — ABNORMAL HIGH (ref 150–400)
RBC: 4.59 MIL/uL (ref 3.87–5.11)
RDW: 20.4 % — ABNORMAL HIGH (ref 11.5–15.5)
Smear Review: NORMAL
WBC: 27.8 10*3/uL — ABNORMAL HIGH (ref 4.0–10.5)
nRBC: 0.2 % (ref 0.0–0.2)

## 2019-05-02 LAB — URINALYSIS, COMPLETE (UACMP) WITH MICROSCOPIC
Bilirubin Urine: NEGATIVE
Glucose, UA: NEGATIVE mg/dL
Hgb urine dipstick: NEGATIVE
Ketones, ur: NEGATIVE mg/dL
Nitrite: NEGATIVE
Protein, ur: NEGATIVE mg/dL
Specific Gravity, Urine: 1.012 (ref 1.005–1.030)
pH: 7 (ref 5.0–8.0)

## 2019-05-02 LAB — TROPONIN I (HIGH SENSITIVITY)
Troponin I (High Sensitivity): 11 ng/L (ref <18)
Troponin I (High Sensitivity): 11 ng/L (ref <18)

## 2019-05-02 LAB — COMPREHENSIVE METABOLIC PANEL
ALT: 8 U/L (ref 0–44)
AST: 19 U/L (ref 15–41)
Albumin: 3.9 g/dL (ref 3.5–5.0)
Alkaline Phosphatase: 67 U/L (ref 38–126)
Anion gap: 10 (ref 5–15)
BUN: 21 mg/dL (ref 8–23)
CO2: 25 mmol/L (ref 22–32)
Calcium: 10.5 mg/dL — ABNORMAL HIGH (ref 8.9–10.3)
Chloride: 103 mmol/L (ref 98–111)
Creatinine, Ser: 1.32 mg/dL — ABNORMAL HIGH (ref 0.44–1.00)
GFR calc Af Amer: 43 mL/min — ABNORMAL LOW (ref >60)
GFR calc non Af Amer: 37 mL/min — ABNORMAL LOW (ref >60)
Glucose, Bld: 117 mg/dL — ABNORMAL HIGH (ref 70–99)
Potassium: 3.6 mmol/L (ref 3.5–5.1)
Sodium: 138 mmol/L (ref 135–145)
Total Bilirubin: 0.8 mg/dL (ref 0.3–1.2)
Total Protein: 6.9 g/dL (ref 6.5–8.1)

## 2019-05-02 LAB — SARS CORONAVIRUS 2 BY RT PCR (HOSPITAL ORDER, PERFORMED IN ~~LOC~~ HOSPITAL LAB): SARS Coronavirus 2: NEGATIVE

## 2019-05-02 LAB — BRAIN NATRIURETIC PEPTIDE: B Natriuretic Peptide: 62 pg/mL (ref 0.0–100.0)

## 2019-05-02 LAB — LACTIC ACID, PLASMA: Lactic Acid, Venous: 1.2 mmol/L (ref 0.5–1.9)

## 2019-05-02 MED ORDER — OXYCODONE HCL 5 MG PO TABS
15.0000 mg | ORAL_TABLET | Freq: Once | ORAL | Status: AC
  Administered 2019-05-02: 15 mg via ORAL
  Filled 2019-05-02: qty 3

## 2019-05-02 NOTE — ED Provider Notes (Signed)
-----------------------------------------   2:52 PM on 05/02/2019 -----------------------------------------  Patient seen in conjunction with [facility].  Overall the patient appears well, clear lung sounds bilaterally.  Patient states she is feeling much better currently.  Denies any active shortness of breath at this time although the patient is resting in bed.  [facility].  Differential at this time would include CHF, COPD, pneumonia, COVID.  We will check labs including cardiac enzymes, obtain a chest x-ray, COVID swab.  Overall patient appears well, she strongly wishes to go home if her work-up is normal.     Harvest Dark, MD 05/02/19 1452

## 2019-05-02 NOTE — ED Provider Notes (Signed)
[facility] Emergency Department Provider Note  ___________________________________________   None    (approximate)  I have reviewed the triage vital signs and the nursing notes.   HISTORY  Chief Complaint Shortness of Breath    HPI Veronica Hubbard is a 84 y.o. female who presents to the emergency department for treatment and evaluation of shortness of breath.  Patient states that she typically uses an inhaler when she feels that her asthma is flaring.  She used her inhaler today, but has not had any improvement.  She feels as if she just cannot get enough air.  She also states that her chest feels "tight."  She is also complaining of back pain.  This back pain is chronic.  She had a kyphoplasty on the 16th and was then sent to [facility].     Past Medical History:  Diagnosis Date  . Asthma   . Cancer ([place]) 02/2018   bone marrow cancer  . Depression   . GERD (gastroesophageal reflux disease)   . Hypertension   . Hypothyroidism   . Personal history of chemotherapy   . Spinal stenosis     Patient Active Problem List   Diagnosis Date Noted  . Intractable pain 04/19/2019  . AAA (abdominal aortic aneurysm) without rupture ([place]) 04/09/2017  . Bilateral carotid artery stenosis 04/09/2017  . Thoracic aortic aneurysm without rupture ([place]) 04/09/2017  . Hyponatremia 02/23/2016  . Chest pain 02/23/2016  . Gastroenteritis due to norovirus 02/09/2016    Past Surgical History:  Procedure Laterality Date  . ABDOMINAL HYSTERECTOMY    . BACK SURGERY    . BREAST BIOPSY Left 2003   neg  . cataracts    . KYPHOPLASTY N/A 04/22/2019   Procedure: KYPHOPLASTY;  Surgeon: Hessie Knows, MD;  Location: [facility] ORS;  Service: Orthopedics;  Laterality: N/A;  . KYPHOPLASTY N/A 04/10/2019   Procedure: KYPHOPLASTY L3;  Surgeon: Hessie Knows, MD;  Location: [facility] ORS;  Service: Orthopedics;  Laterality: N/A;  . lipoma removal    . SHOULDER SURGERY      Prior  to Admission medications   Medication Sig Start Date End Date Taking? Authorizing Provider  allopurinol (ZYLOPRIM) 100 MG tablet Take 200 mg by mouth daily. 05/04/18  Yes [provider]  azelastine (ASTELIN) 0.1 % nasal spray Place 1 spray into both nostrils daily. Use in each nostril as directed   Yes [provider]  budesonide (PULMICORT) 0.5 MG/2ML nebulizer solution Take 0.5 mg by nebulization 2 (two) times daily.   Yes [provider]  Cholecalciferol (VITAMIN D3) 25 MCG (1000 UT) CAPS Take 1,000 Units by mouth daily.   Yes [provider]  citalopram (CELEXA) 20 MG tablet Take 20 mg by mouth daily.   Yes [provider]  cyclobenzaprine (FLEXERIL) 5 MG tablet Take 5-10 mg by mouth at bedtime as needed for muscle spasms.   Yes [provider]  docusate sodium (COLACE) 100 MG capsule Take 1 capsule (100 mg total) by mouth 2 (two) times daily. 04/27/19  Yes Mayo, Pete Pelt, MD  ezetimibe (ZETIA) 10 MG tablet Take 10 mg by mouth at bedtime.    Yes [provider]  fluticasone (FLONASE) 50 MCG/ACT nasal spray Place 1 spray into both nostrils 2 (two) times a day.   Yes [provider]  Fluticasone-Umeclidin-Vilant 100-62.5-25 MCG/INH AEPB Inhale 1 puff into the lungs daily.   Yes [provider]  hydrALAZINE (APRESOLINE) 50 MG tablet Take 1 tablet (50  mg total) by mouth every 8 (eight) hours. 04/27/19  Yes Mayo, Pete Pelt, MD  ipratropium-albuterol (DUONEB) 0.5-2.5 (3) MG/3ML SOLN Take 3 mLs by nebulization 4 (four) times daily as needed.   Yes [provider]  isosorbide mononitrate (IMDUR) 30 MG 24 hr tablet Take 30 mg by mouth daily.   Yes [provider]  levothyroxine (SYNTHROID) 150 MCG tablet Take 1 tablet (150 mcg total) by mouth daily before breakfast. 04/28/19  Yes Mayo, Pete Pelt, MD  Melatonin 5 MG TABS Take 5 mg by mouth at bedtime.   Yes [provider]  nystatin (MYCOSTATIN)  100000 UNIT/ML suspension Take 4 mLs by mouth 4 (four) times daily. Swish and hold in mouth for as long as possible then swallow   Yes [provider]  oxyCODONE (ROXICODONE) 15 MG immediate release tablet Take 0.5 tablets (7.5 mg total) by mouth every 6 (six) hours as needed for moderate pain. 04/27/19  Yes Mayo, Pete Pelt, MD  pantoprazole (PROTONIX) 40 MG tablet Take 40 mg by mouth 2 (two) times daily.   Yes [provider]  acetaminophen (TYLENOL) 325 MG tablet Take 2 tablets (650 mg total) by mouth every 6 (six) hours as needed for mild pain (or Fever >/= 101). 04/27/19   Mayo, Pete Pelt, MD  ondansetron (ZOFRAN-ODT) 4 MG disintegrating tablet Take 4 mg by mouth every 8 (eight) hours as needed for nausea or vomiting.    [provider]  polyethylene glycol (MIRALAX / GLYCOLAX) 17 g packet Take 17 g by mouth daily as needed. 04/27/19   Mayo, Pete Pelt, MD    Allergies Aspirin, Celecoxib, Iodinated diagnostic agents, Losartan, Morphine, and Verapamil  Family History  Problem Relation Age of Onset  . Leukemia Mother   . CAD Father   . Diabetes Father   . Breast cancer Sister 15    Social History Social History   Tobacco Use  . Smoking status: Former Smoker    Types: Cigarettes  . Smokeless tobacco: Never Used  Substance Use Topics  . Alcohol use: No  . Drug use: No    Review of Systems  Constitutional: No fever/chills Eyes: No visual changes. ENT: No sore throat. Cardiovascular: Positive for chest pressure. Respiratory: Positive for shortness of breath. Gastrointestinal: No abdominal pain.  Positive for nausea, no vomiting.  No diarrhea.  No constipation. Genitourinary: Negative for dysuria. Musculoskeletal: Positive  for back pain. Skin: Negative for rash. Neurological: Positive for headaches, focal weakness or numbness. ____________________________________________   PHYSICAL EXAM:  VITAL SIGNS: ED Triage Vitals  Enc Vitals Group     BP  05/02/19 1317 (!) 146/54     Pulse Rate 05/02/19 1317 85     Resp 05/02/19 1317 18     Temp 05/02/19 1317 98.6 F (37 C)     Temp Source 05/02/19 1317 Oral     SpO2 05/02/19 1317 97 %     Weight 05/02/19 1318 152 lb 1.9 oz (69 kg)     Height 05/02/19 1318 5\' 3"  (1.6 m)     Head Circumference --      [facility] --      Pain Score 05/02/19 1318 8     Pain Loc --      Pain Edu? --      Excl. in [place]? --     Constitutional: Alert and oriented. Well appearing and in no acute distress. Eyes: Conjunctivae are normal. PERRL. EOMI. Head: Atraumatic. Nose: No congestion/rhinnorhea. Mouth/Throat: Mucous membranes  are moist.  Oropharynx non-erythematous. Neck: No stridor.   Cardiovascular: Normal rate, regular rhythm. Grossly normal heart sounds.  Good peripheral circulation. Respiratory: Normal respiratory effort.  No retractions. Lungs CTAB. Gastrointestinal: Soft and nontender. No distention. No abdominal bruits. No CVA tenderness. Musculoskeletal: No lower extremity tenderness nor edema.  No joint effusions. Neurologic:  Normal speech and language. No gross focal neurologic deficits are appreciated. No gait instability. Skin:  Skin is warm, dry and intact. No rash noted. Psychiatric: Mood and affect are normal. Speech and behavior are normal.  ____________________________________________   LABS (all labs ordered are listed, but only abnormal results are displayed)  Labs Reviewed  CBC WITH DIFFERENTIAL/PLATELET - Abnormal; Notable for the following components:      Result Value   WBC 27.8 (*)    RDW 20.4 (*)    Platelets 421 (*)    Neutro Abs 17.4 (*)    Monocytes Absolute 1.3 (*)    Eosinophils Absolute 0.9 (*)    Basophils Absolute 0.8 (*)    Abs Immature Granulocytes 5.27 (*)    All other components within normal limits  COMPREHENSIVE METABOLIC PANEL - Abnormal; Notable for the following components:   Glucose, Bld 117 (*)    Creatinine, Ser 1.32 (*)    Calcium 10.5 (*)     GFR calc non Af Amer 37 (*)    GFR calc Af Amer 43 (*)    All other components within normal limits  URINALYSIS, COMPLETE (UACMP) WITH MICROSCOPIC - Abnormal; Notable for the following components:   Color, Urine YELLOW (*)    APPearance CLEAR (*)    Leukocytes,Ua SMALL (*)    Bacteria, UA RARE (*)    All other components within normal limits  SARS CORONAVIRUS 2 (HOSPITAL ORDER, [place] [facility])  CULTURE, BLOOD (ROUTINE X 2)  CULTURE, BLOOD (ROUTINE X 2)  URINE CULTURE  BRAIN NATRIURETIC PEPTIDE  TROPONIN I (HIGH SENSITIVITY)  TROPONIN I (HIGH SENSITIVITY)  LACTIC ACID, PLASMA   ____________________________________________  EKG  ED ECG REPORT I, Breeanna Galgano, FNP-BC personally viewed and interpreted this ECG.   Date: 05/02/2019  EKG Time: 1416  Rate: 80  Rhythm: Sinus arrhythmia  Axis: normal  Intervals:none  ST&T Change: no ST elevation  ____________________________________________  RADIOLOGY  ED MD interpretation: No acute cardiopulmonary abnormality on chest x-ray.  Lumbar spine image shows no new fractures.  Official radiology report(s): Dg Lumbar Spine 2-3 Views  Result Date: 05/02/2019 CLINICAL DATA:  Chronic low back pain. No recent injury. History of prior kyphoplasty. EXAM: LUMBAR SPINE - 2-3 VIEW COMPARISON:  April 19, 2019 FINDINGS: The patient is status post L3 and L4 kyphoplasty. There is no significant interval height loss of either vertebral body. There are multilevel degenerative changes throughout the visualized lumbar spine. There is osteopenia. Again noted is an abdominal aortic aneurysm with heavy calcifications throughout the abdominal aorta. There is grade 1 anterolisthesis of L3 on L4 and L4 on L5. there is a probable stone in the lower pole the right kidney. IMPRESSION: 1. No new acute displaced fracture. If there is high clinical suspicion for an occult fracture follow-up with MRI is recommended. 2. Status post kyphoplasty at  the L3 and L4 levels. No additional further height loss of either vertebral body is noted. 3. Multilevel degenerative changes of the lumbar spine as detailed above. 4. Abdominal aortic aneurysm better visualized on prior CT. Electronically Signed   By: Constance Holster M.D.   On: 05/02/2019 19:01  Dg Chest Port 1 View  Result Date: 05/02/2019 CLINICAL DATA:  Shortness of breath EXAM: PORTABLE CHEST 1 VIEW COMPARISON:  April 22, 2019 chest radiograph and chest CT June 07, 2018 FINDINGS: There is no edema or consolidation. There is mild right base atelectasis. Heart is upper normal in size with pulmonary vascularity normal. Aneurysmal dilatation in the descending aorta appears stable by radiography. There is aortic atherosclerotic calcification. No adenopathy. No bone lesions. IMPRESSION: Aortic atherosclerosis with apparent descending thoracic aortic aneurysmal dilatation, grossly stable by radiography. No edema or consolidation. Mild right base atelectasis noted. Stable cardiac silhouette. Electronically Signed   By: Lowella Grip III M.D.   On: 05/02/2019 14:21    ____________________________________________   PROCEDURES  Procedure(s) performed (including Critical Care):  Procedures   ____________________________________________   INITIAL IMPRESSION / ASSESSMENT AND PLAN / ED COURSE  As part of my medical decision making, I reviewed the following data within the electronic MEDICAL RECORD NUMBER Notes from prior ED visits     84 year old female presenting to the emergency department for treatment and evaluation of chest pressure and shortness of breath.  [facility] on April 22, 2019.  Symptoms today started gradually and were not relieved with her inhaler.  Patient does appear to be somewhat short of breath, but states that she is unable to breathe with a facemask on.  Plan will be to get labs and chest x-ray.  We will also  collect a COVID-19 swab to be rented house as there have been some positive cases in the skilled nursing facility where she is currently residing.  Also will plan to do an ambulatory sat monitor to see what she does when she is ambulating.  ----------------------------------------- 3:51 PM on 05/02/2019 -----------------------------------------  White blood cell count noted to be 27.8 with a left shift.  She does have a history of chronic leukocytosis, but because she had a recent hospitalization with kyphoplasty and her respiratory rate is 22 with an oxygen saturation of 93% on room air I will get a lactic acid to ensure she is nonseptic and some blood cultures.  ----------------------------------------- 5:24 PM on 05/02/2019 -----------------------------------------  Patient up to bathroom with little assistance. She is able to bear weight and is steady. She does not feel that she has any new fractures. She was given her Oxy IR as per home MAR. Urine sample collected and will be sent. Plan will be to then discharge her. She is requesting to go home and not back to [facility].    ----------------------------------------- 6:17 PM on 05/02/2019 -----------------------------------------  Patient requested that I call her sister, [place], to give her an update and ask that she come pick her up when she is discharged.  I did speak with Ms. Percell Miller and she will come to the hospital she said overall to get her about an hour to get here. Patient is pleased that she is going home. Original discharge date from [place] was to be tomorrow. She is to keep all scheduled follow up appointments. She was also advised to return to the ER for any symptom of concern.  ____________________________________________   FINAL CLINICAL IMPRESSION(S) / ED DIAGNOSES  Final diagnoses:  SOB (shortness of breath)  Back pain     ED Discharge Orders    None       Note:  This document was prepared using Dragon voice  recognition software and may include unintentional dictation errors.  Victorino Dike, FNP 05/02/19 2113    Harvest Dark, MD 05/12/19 1441

## 2019-05-02 NOTE — ED Triage Notes (Signed)
Pt to ER via EMS from [facility] with c/o SHOB.  Pt states she is usually improved with breathing treatment, but had no improvement with treatment prior to calling EMS.  Pt also has hx of lumbar fracture and c/o back pain.

## 2019-05-02 NOTE — ED Notes (Signed)
Patient maintain her posture, walked with stand-by assist to the stretcher and was able to independently get on and reposition self on stretcher. Juanda Chance NP at bedside.

## 2019-05-02 NOTE — ED Notes (Signed)
Patient assisted to an upright position from being in the stretcher. Patient ambulated independently with stand-by assist to the room commode.

## 2019-05-02 NOTE — ED Notes (Signed)
Report given via telephone To Sharlyn Bologna, Director of Nursing at [facility]. She stated that patient was weight-bearing and fairly independent two days ago, but one day ago c/o "excruciating back pain and left leg pain and was unable to bear weight on left leg." MD aware. This kind of pain apparently indicated a vertebrae fracture in the recent past.

## 2019-05-05 LAB — URINE CULTURE: Culture: >=100000 — AB

## 2019-05-05 LAB — BCR-ABL1 FISH
Cells Analyzed: 200
Cells Counted: 200

## 2019-05-07 LAB — CULTURE, BLOOD (ROUTINE X 2)
Culture: NO GROWTH
Culture: NO GROWTH
Special Requests: ADEQUATE

## 2019-05-11 ENCOUNTER — Other Ambulatory Visit: Payer: Self-pay

## 2019-05-11 ENCOUNTER — Inpatient Hospital Stay
Admission: EM | Admit: 2019-05-11 | Discharge: 2019-05-19 | DRG: 516 | Disposition: A | Discharge status: Home Health | Payer: Medicare HMO | Attending: Internal Medicine | Admitting: Internal Medicine

## 2019-05-11 ENCOUNTER — Emergency Department: Payer: Medicare HMO

## 2019-05-11 ENCOUNTER — Encounter: Payer: Self-pay | Admitting: Emergency Medicine

## 2019-05-11 DIAGNOSIS — Z981 Arthrodesis status: Secondary | ICD-10-CM | POA: Diagnosis not present

## 2019-05-11 DIAGNOSIS — Z833 Family history of diabetes mellitus: Secondary | ICD-10-CM

## 2019-05-11 DIAGNOSIS — Z87891 Personal history of nicotine dependence: Secondary | ICD-10-CM

## 2019-05-11 DIAGNOSIS — E039 Hypothyroidism, unspecified: Secondary | ICD-10-CM | POA: Diagnosis present

## 2019-05-11 DIAGNOSIS — N390 Urinary tract infection, site not specified: Secondary | ICD-10-CM | POA: Diagnosis present

## 2019-05-11 DIAGNOSIS — K219 Gastro-esophageal reflux disease without esophagitis: Secondary | ICD-10-CM | POA: Diagnosis present

## 2019-05-11 DIAGNOSIS — Z806 Family history of leukemia: Secondary | ICD-10-CM

## 2019-05-11 DIAGNOSIS — Z9981 Dependence on supplemental oxygen: Secondary | ICD-10-CM

## 2019-05-11 DIAGNOSIS — F329 Major depressive disorder, single episode, unspecified: Secondary | ICD-10-CM | POA: Diagnosis present

## 2019-05-11 DIAGNOSIS — R1110 Vomiting, unspecified: Secondary | ICD-10-CM

## 2019-05-11 DIAGNOSIS — Z7951 Long term (current) use of inhaled steroids: Secondary | ICD-10-CM

## 2019-05-11 DIAGNOSIS — K5900 Constipation, unspecified: Secondary | ICD-10-CM | POA: Diagnosis present

## 2019-05-11 DIAGNOSIS — M25552 Pain in left hip: Secondary | ICD-10-CM | POA: Diagnosis present

## 2019-05-11 DIAGNOSIS — Z885 Allergy status to narcotic agent status: Secondary | ICD-10-CM | POA: Diagnosis not present

## 2019-05-11 DIAGNOSIS — R109 Unspecified abdominal pain: Secondary | ICD-10-CM

## 2019-05-11 DIAGNOSIS — I10 Essential (primary) hypertension: Secondary | ICD-10-CM | POA: Diagnosis present

## 2019-05-11 DIAGNOSIS — Z419 Encounter for procedure for purposes other than remedying health state, unspecified: Secondary | ICD-10-CM

## 2019-05-11 DIAGNOSIS — B952 Enterococcus as the cause of diseases classified elsewhere: Secondary | ICD-10-CM | POA: Diagnosis present

## 2019-05-11 DIAGNOSIS — M4856XA Collapsed vertebra, not elsewhere classified, lumbar region, initial encounter for fracture: Principal | ICD-10-CM | POA: Diagnosis present

## 2019-05-11 DIAGNOSIS — S32010A Wedge compression fracture of first lumbar vertebra, initial encounter for closed fracture: Secondary | ICD-10-CM

## 2019-05-11 DIAGNOSIS — M549 Dorsalgia, unspecified: Secondary | ICD-10-CM

## 2019-05-11 DIAGNOSIS — Z8249 Family history of ischemic heart disease and other diseases of the circulatory system: Secondary | ICD-10-CM

## 2019-05-11 DIAGNOSIS — J45909 Unspecified asthma, uncomplicated: Secondary | ICD-10-CM | POA: Diagnosis present

## 2019-05-11 DIAGNOSIS — Z1159 Encounter for screening for other viral diseases: Secondary | ICD-10-CM | POA: Diagnosis not present

## 2019-05-11 DIAGNOSIS — Z803 Family history of malignant neoplasm of breast: Secondary | ICD-10-CM | POA: Diagnosis not present

## 2019-05-11 DIAGNOSIS — D469 Myelodysplastic syndrome, unspecified: Secondary | ICD-10-CM | POA: Diagnosis present

## 2019-05-11 LAB — COMPREHENSIVE METABOLIC PANEL
ALT: 6 U/L (ref 0–44)
AST: 17 U/L (ref 15–41)
Albumin: 3.8 g/dL (ref 3.5–5.0)
Alkaline Phosphatase: 75 U/L (ref 38–126)
Anion gap: 7 (ref 5–15)
BUN: 24 mg/dL — ABNORMAL HIGH (ref 8–23)
CO2: 27 mmol/L (ref 22–32)
Calcium: 10.6 mg/dL — ABNORMAL HIGH (ref 8.9–10.3)
Chloride: 103 mmol/L (ref 98–111)
Creatinine, Ser: 1.21 mg/dL — ABNORMAL HIGH (ref 0.44–1.00)
GFR calc Af Amer: 48 mL/min — ABNORMAL LOW (ref >60)
GFR calc non Af Amer: 42 mL/min — ABNORMAL LOW (ref >60)
Glucose, Bld: 96 mg/dL (ref 70–99)
Potassium: 3.6 mmol/L (ref 3.5–5.1)
Sodium: 137 mmol/L (ref 135–145)
Total Bilirubin: 0.6 mg/dL (ref 0.3–1.2)
Total Protein: 6.6 g/dL (ref 6.5–8.1)

## 2019-05-11 LAB — CBC WITH DIFFERENTIAL/PLATELET
Abs Immature Granulocytes: 2.74 10*3/uL — ABNORMAL HIGH (ref 0.00–0.07)
Basophils Absolute: 0.6 10*3/uL — ABNORMAL HIGH (ref 0.0–0.1)
Basophils Relative: 2 %
Eosinophils Absolute: 1.1 10*3/uL — ABNORMAL HIGH (ref 0.0–0.5)
Eosinophils Relative: 4 %
HCT: 39.6 % (ref 36.0–46.0)
Hemoglobin: 12.5 g/dL (ref 12.0–15.0)
Immature Granulocytes: 11 %
Lymphocytes Relative: 11 %
Lymphs Abs: 2.8 10*3/uL (ref 0.7–4.0)
MCH: 27.6 pg (ref 26.0–34.0)
MCHC: 31.6 g/dL (ref 30.0–36.0)
MCV: 87.4 fL (ref 80.0–100.0)
Monocytes Absolute: 0.9 10*3/uL (ref 0.1–1.0)
Monocytes Relative: 3 %
Neutro Abs: 17.8 10*3/uL — ABNORMAL HIGH (ref 1.7–7.7)
Neutrophils Relative %: 69 %
Platelets: 318 10*3/uL (ref 150–400)
RBC: 4.53 MIL/uL (ref 3.87–5.11)
RDW: 21 % — ABNORMAL HIGH (ref 11.5–15.5)
Smear Review: NORMAL
WBC: 26 10*3/uL — ABNORMAL HIGH (ref 4.0–10.5)
nRBC: 0.5 % — ABNORMAL HIGH (ref 0.0–0.2)

## 2019-05-11 LAB — URINALYSIS, COMPLETE (UACMP) WITH MICROSCOPIC
Bilirubin Urine: NEGATIVE
Glucose, UA: NEGATIVE mg/dL
Hgb urine dipstick: NEGATIVE
Ketones, ur: NEGATIVE mg/dL
Nitrite: NEGATIVE
Protein, ur: NEGATIVE mg/dL
Specific Gravity, Urine: 1.014 (ref 1.005–1.030)
WBC, UA: >50 WBC/hpf — ABNORMAL HIGH (ref 0–5)
pH: 6 (ref 5.0–8.0)

## 2019-05-11 LAB — SARS CORONAVIRUS 2 BY RT PCR (HOSPITAL ORDER, PERFORMED IN ~~LOC~~ HOSPITAL LAB): SARS Coronavirus 2: NEGATIVE

## 2019-05-11 MED ORDER — FLUTICASONE FUROATE-VILANTEROL 100-25 MCG/INH IN AEPB
1.0000 | INHALATION_SPRAY | Freq: Every day | RESPIRATORY_TRACT | Status: DC
  Administered 2019-05-12 – 2019-05-19 (×8): 1 via RESPIRATORY_TRACT
  Filled 2019-05-11: qty 28

## 2019-05-11 MED ORDER — ONDANSETRON HCL 4 MG/2ML IJ SOLN
4.0000 mg | Freq: Once | INTRAMUSCULAR | Status: AC
  Administered 2019-05-11: 4 mg via INTRAVENOUS
  Filled 2019-05-11: qty 2

## 2019-05-11 MED ORDER — TRIAMCINOLONE ACETONIDE 0.5 % EX CREA
1.0000 "application " | TOPICAL_CREAM | Freq: Three times a day (TID) | CUTANEOUS | Status: DC
  Administered 2019-05-11 – 2019-05-19 (×15): 1 via TOPICAL
  Filled 2019-05-11: qty 15

## 2019-05-11 MED ORDER — ACETAMINOPHEN 325 MG PO TABS
650.0000 mg | ORAL_TABLET | Freq: Four times a day (QID) | ORAL | Status: DC | PRN
  Administered 2019-05-13 – 2019-05-18 (×4): 650 mg via ORAL
  Filled 2019-05-11 (×5): qty 2

## 2019-05-11 MED ORDER — FLUTICASONE-UMECLIDIN-VILANT 100-62.5-25 MCG/INH IN AEPB: 1.0000 | INHALATION_SPRAY | Freq: Every day | RESPIRATORY_TRACT | Status: DC

## 2019-05-11 MED ORDER — OXYCODONE-ACETAMINOPHEN 7.5-325 MG PO TABS
2.0000 | ORAL_TABLET | Freq: Four times a day (QID) | ORAL | Status: DC | PRN
  Administered 2019-05-11 – 2019-05-14 (×7): 2 via ORAL
  Filled 2019-05-11 (×8): qty 2

## 2019-05-11 MED ORDER — CYCLOBENZAPRINE HCL 10 MG PO TABS
5.0000 mg | ORAL_TABLET | Freq: Every evening | ORAL | Status: DC | PRN
  Administered 2019-05-13 – 2019-05-18 (×4): 10 mg via ORAL
  Filled 2019-05-11 (×4): qty 1

## 2019-05-11 MED ORDER — EZETIMIBE 10 MG PO TABS
10.0000 mg | ORAL_TABLET | Freq: Every day | ORAL | Status: DC
  Administered 2019-05-11 – 2019-05-18 (×8): 10 mg via ORAL
  Filled 2019-05-11 (×8): qty 1

## 2019-05-11 MED ORDER — MELATONIN 5 MG PO TABS
5.0000 mg | ORAL_TABLET | Freq: Every day | ORAL | Status: DC
  Administered 2019-05-11 – 2019-05-18 (×8): 5 mg via ORAL
  Filled 2019-05-11 (×9): qty 1

## 2019-05-11 MED ORDER — HYDROMORPHONE HCL 1 MG/ML IJ SOLN
0.5000 mg | Freq: Once | INTRAMUSCULAR | Status: AC
  Administered 2019-05-11: 12:00:00 0.5 mg via INTRAVENOUS
  Filled 2019-05-11: qty 1

## 2019-05-11 MED ORDER — ALLOPURINOL 100 MG PO TABS
200.0000 mg | ORAL_TABLET | Freq: Every day | ORAL | Status: DC
  Administered 2019-05-12 – 2019-05-19 (×6): 200 mg via ORAL
  Filled 2019-05-11 (×9): qty 2

## 2019-05-11 MED ORDER — LEVOTHYROXINE SODIUM 50 MCG PO TABS
150.0000 ug | ORAL_TABLET | Freq: Every day | ORAL | Status: DC
  Administered 2019-05-13 – 2019-05-19 (×7): 150 ug via ORAL
  Filled 2019-05-11 (×5): qty 1
  Filled 2019-05-11: qty 3
  Filled 2019-05-11: qty 1

## 2019-05-11 MED ORDER — HYDRALAZINE HCL 20 MG/ML IJ SOLN
10.0000 mg | Freq: Four times a day (QID) | INTRAMUSCULAR | Status: DC | PRN
  Administered 2019-05-14 (×2): 10 mg via INTRAVENOUS
  Filled 2019-05-11 (×2): qty 1

## 2019-05-11 MED ORDER — HYDROMORPHONE HCL 1 MG/ML IJ SOLN: 0.5000 mg | INTRAMUSCULAR | Status: DC | PRN

## 2019-05-11 MED ORDER — HYDROMORPHONE HCL 1 MG/ML IJ SOLN
0.5000 mg | Freq: Once | INTRAMUSCULAR | Status: AC
  Administered 2019-05-11: 0.5 mg via INTRAVENOUS
  Filled 2019-05-11: qty 1

## 2019-05-11 MED ORDER — PANTOPRAZOLE SODIUM 40 MG PO TBEC
40.0000 mg | DELAYED_RELEASE_TABLET | Freq: Two times a day (BID) | ORAL | Status: DC
  Administered 2019-05-11 – 2019-05-19 (×14): 40 mg via ORAL
  Filled 2019-05-11 (×15): qty 1

## 2019-05-11 MED ORDER — POLYETHYLENE GLYCOL 3350 17 G PO PACK
17.0000 g | PACK | Freq: Every day | ORAL | Status: DC | PRN
  Administered 2019-05-13 – 2019-05-15 (×2): 17 g via ORAL
  Filled 2019-05-11 (×2): qty 1

## 2019-05-11 MED ORDER — METHYLNALTREXONE BROMIDE 12 MG/0.6ML ~~LOC~~ SOLN: 12.0000 mg | Freq: Once | SUBCUTANEOUS | Status: DC

## 2019-05-11 MED ORDER — ENOXAPARIN SODIUM 30 MG/0.3ML ~~LOC~~ SOLN: 30.0000 mg | SUBCUTANEOUS | Status: DC

## 2019-05-11 MED ORDER — VITAMIN D3 25 MCG (1000 UNIT) PO TABS
1000.0000 [IU] | ORAL_TABLET | Freq: Every day | ORAL | Status: DC
  Administered 2019-05-12 – 2019-05-19 (×6): 1000 [IU] via ORAL
  Filled 2019-05-11 (×16): qty 1

## 2019-05-11 MED ORDER — IPRATROPIUM-ALBUTEROL 0.5-2.5 (3) MG/3ML IN SOLN
3.0000 mL | Freq: Four times a day (QID) | RESPIRATORY_TRACT | Status: DC | PRN
  Administered 2019-05-16: 3 mL via RESPIRATORY_TRACT

## 2019-05-11 MED ORDER — ISOSORBIDE MONONITRATE ER 30 MG PO TB24
30.0000 mg | ORAL_TABLET | Freq: Every day | ORAL | Status: DC
  Administered 2019-05-12 – 2019-05-19 (×6): 30 mg via ORAL
  Filled 2019-05-11 (×9): qty 1

## 2019-05-11 MED ORDER — UMECLIDINIUM BROMIDE 62.5 MCG/INH IN AEPB
1.0000 | INHALATION_SPRAY | Freq: Every day | RESPIRATORY_TRACT | Status: DC
  Administered 2019-05-12 – 2019-05-19 (×8): 1 via RESPIRATORY_TRACT
  Filled 2019-05-11: qty 7

## 2019-05-11 MED ORDER — HYDRALAZINE HCL 50 MG PO TABS
50.0000 mg | ORAL_TABLET | Freq: Three times a day (TID) | ORAL | Status: DC
  Administered 2019-05-11 – 2019-05-18 (×18): 50 mg via ORAL
  Filled 2019-05-11 (×22): qty 1

## 2019-05-11 MED ORDER — CITALOPRAM HYDROBROMIDE 20 MG PO TABS
20.0000 mg | ORAL_TABLET | Freq: Every day | ORAL | Status: DC
  Administered 2019-05-12 – 2019-05-19 (×6): 20 mg via ORAL
  Filled 2019-05-11 (×7): qty 1

## 2019-05-11 MED ORDER — BUDESONIDE 0.5 MG/2ML IN SUSP
0.5000 mg | Freq: Two times a day (BID) | RESPIRATORY_TRACT | Status: DC
  Administered 2019-05-11 – 2019-05-19 (×15): 0.5 mg via RESPIRATORY_TRACT
  Filled 2019-05-11 (×19): qty 2

## 2019-05-11 MED ORDER — METHYLNALTREXONE BROMIDE 12 MG/0.6ML ~~LOC~~ SOLN
6.0000 mg | Freq: Once | SUBCUTANEOUS | Status: AC
  Administered 2019-05-11: 6 mg via SUBCUTANEOUS
  Filled 2019-05-11: qty 0.6

## 2019-05-11 NOTE — ED Triage Notes (Signed)
Patient brought in by ems from home. Patient had back surgery about 2 weeks ago. Patient reports that she continues to have back pain with no improvement with oxycodone. Patient was given 100 mcg fentanyl by ems.

## 2019-05-11 NOTE — ED Notes (Signed)
Report received, care of pt assumed.  PT resting quietly on ER stretcher, VSS.  Pt awaiting assessment by MD.  Pt continues to c/o low back pain.  NAD noted at this time.

## 2019-05-11 NOTE — Consult Note (Signed)
ORTHOPAEDIC CONSULTATION  REQUESTING PHYSICIAN: Otila Back, MD  Chief Complaint:   Back pain  History of Present Illness: Veronica Hubbard is a 84 y.o. female who had an acute worsening of back pain today.  The patient has a history of L3 kyphoplasty on 04/10/2019 And L3 and L4 kyphoplasty on 04/22/2019, both by Dr. Rudene Christians.  The patient was discharged to a SNF and was then transferred home recently.  She states that her pain was never completely resolved, but dramatically worsened ~2-3 days ago. Pain is located at midline of lower back with radiation bilaterally about paraspinal muscles. Pain is described as sharp at its worst and a dull ache at its best.  Pain is rated a 10 out of 10 in severity and she is unable to ambulate due to this pain. Prior to a few days ago she was able to take some steps with a walker.  Pain is improved with rest and immobilization.  Pain is worse with any sort of movement.  Imaging in the emergency department suggests new L1 compression fracture with significant bony edema at L2 suggestive of possible fracture as well.  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  Past Medical History:  Diagnosis Date  . Asthma   . Cancer ([place]) 02/2018   bone marrow cancer  . Depression   . GERD (gastroesophageal reflux disease)   . Hypertension   . Hypothyroidism   . Personal history of chemotherapy   . Spinal stenosis    Past Surgical History:  Procedure Laterality Date  . ABDOMINAL HYSTERECTOMY    . BACK SURGERY    . BREAST BIOPSY Left 2003   neg  . cataracts    . KYPHOPLASTY N/A 04/22/2019   Procedure: KYPHOPLASTY;  Surgeon: Hessie Knows, MD;  Location: [facility] ORS;  Service: Orthopedics;  Laterality: N/A;  . KYPHOPLASTY N/A 04/10/2019   Procedure: KYPHOPLASTY L3;  Surgeon: Hessie Knows, MD;  Location: [facility] ORS;  Service: Orthopedics;  Laterality: N/A;  . lipoma removal    .  SHOULDER SURGERY     Social History   Socioeconomic History  . Marital status: Single    Spouse name: Not on file  . Number of children: Not on file  . Years of education: Not on file  . Highest education level: Not on file  Occupational History  . Not on file  Social Needs  . Financial resource strain: Not on file  . Food insecurity    Worry: Not on file    Inability: Not on file  . Transportation needs    Medical: Not on file    Non-medical: Not on file  Tobacco Use  . Smoking status: Former Smoker    Types: Cigarettes  . Smokeless tobacco: Never Used  Substance and Sexual Activity  . Alcohol use: No  . Drug use: No  . Sexual activity: Not on file  Lifestyle  . Physical activity    Days per week: Not on file    Minutes per session: Not on file  . Stress: Not on file  Relationships  . Social Herbalist on phone: Not on file    Gets together: Not on file    Attends religious service: Not on file    Active member of club or organization: Not on file    Attends meetings of clubs or organizations: Not on file    Relationship status: Not on file  Other Topics Concern  . Not  on file  Social History Narrative  . Not on file   Family History  Problem Relation Age of Onset  . Leukemia Mother   . CAD Father   . Diabetes Father   . Breast cancer Sister 65   Allergies  Allergen Reactions  . Aspirin Shortness Of Breath  . Celecoxib Shortness Of Breath  . Iodinated Diagnostic Agents Other (See Comments)    Reaction:  Decreased kidney function  . Losartan Itching  . Morphine Nausea And Vomiting  . Verapamil Itching   Prior to Admission medications   Medication Sig Start Date End Date Taking? Authorizing Provider  acetaminophen (TYLENOL) 325 MG tablet Take 2 tablets (650 mg total) by mouth every 6 (six) hours as needed for mild pain (or Fever >/= 101). 04/27/19  Yes Mayo, Pete Pelt, MD  allopurinol (ZYLOPRIM) 100 MG tablet Take 200 mg by mouth daily.  05/04/18  Yes [provider]  budesonide (PULMICORT) 0.5 MG/2ML nebulizer solution Take 0.5 mg by nebulization 2 (two) times daily.   Yes [provider]  Cholecalciferol (VITAMIN D3) 25 MCG (1000 UT) CAPS Take 1,000 Units by mouth daily.   Yes [provider]  citalopram (CELEXA) 20 MG tablet Take 20 mg by mouth daily.   Yes [provider]  cyclobenzaprine (FLEXERIL) 5 MG tablet Take 5-10 mg by mouth at bedtime as needed for muscle spasms.   Yes [provider]  ezetimibe (ZETIA) 10 MG tablet Take 10 mg by mouth at bedtime.    Yes [provider]  Fluticasone-Umeclidin-Vilant 100-62.5-25 MCG/INH AEPB Inhale 1 puff into the lungs daily.   Yes [provider]  hydrALAZINE (APRESOLINE) 50 MG tablet Take 1 tablet (50 mg total) by mouth every 8 (eight) hours. 04/27/19  Yes Mayo, Pete Pelt, MD  ipratropium-albuterol (DUONEB) 0.5-2.5 (3) MG/3ML SOLN Take 3 mLs by nebulization 4 (four) times daily as needed.   Yes [provider]  isosorbide mononitrate (IMDUR) 30 MG 24 hr tablet Take 30 mg by mouth daily.   Yes [provider]  levothyroxine (SYNTHROID) 150 MCG tablet Take 1 tablet (150 mcg total) by mouth daily before breakfast. 04/28/19  Yes Mayo, Pete Pelt, MD  Melatonin 5 MG TABS Take 5 mg by mouth at bedtime.   Yes [provider]  oxyCODONE-acetaminophen (PERCOCET) 7.5-325 MG tablet Take 1 tablet by mouth every 6 (six) hours as needed. 05/05/19  Yes [provider]  pantoprazole (PROTONIX) 40 MG tablet Take 40 mg by mouth 2 (two) times daily.   Yes [provider]  polyethylene glycol (MIRALAX / GLYCOLAX) 17 g packet Take 17 g by mouth daily as needed. 04/27/19  Yes Mayo, Pete Pelt, MD  triamcinolone cream (KENALOG) 0.5 % Apply 1 application topically 3 (three) times daily.   Yes [provider]  oxyCODONE (ROXICODONE) 15 MG immediate release tablet Take 0.5 tablets (7.5 mg total) by  mouth every 6 (six) hours as needed for moderate pain. Patient not taking: Reported on 05/11/2019 04/27/19   Sela Hua, MD   Recent Labs    05/11/19 0654  WBC 26.0*  HGB 12.5  HCT 39.6  PLT 318  K 3.6  CL 103  CO2 27  BUN 24*  CREATININE 1.21*  GLUCOSE 96  CALCIUM 10.6*   Dg Lumbar Spine 2-3 Views  Result Date: 05/11/2019 CLINICAL DATA:  Unresolved low back pain. Unable to walk or stand. Kyphoplasty 04/22/2019 EXAM: LUMBAR SPINE - 2-3 VIEW COMPARISON:  05/02/2019.  CT  04/24/2019.  MRI 04/19/2019 FINDINGS: Previously augmented fractures at L3 and L4 as seen previously. No evidence of progression at those 2 levels. Chronic anterolisthesis at L3-4 of 7 mm. I think there is a new fracture at L1 with loss of height of about 10%. No other change. IMPRESSION: Suspicion of a new fracture at L1 with loss of height of about 10%. Electronically Signed   By: Nelson Chimes M.D.   On: 05/11/2019 07:55   Mr Lumbar Spine Wo Contrast  Result Date: 05/11/2019 CLINICAL DATA:  Recent L3 and L4 kyphoplasties. Continued back pain. Suspected new L1 compression fracture on x-ray. EXAM: MRI LUMBAR SPINE WITHOUT CONTRAST TECHNIQUE: Multiplanar, multisequence MR imaging of the lumbar spine was performed. No intravenous contrast was administered. COMPARISON:  Lumbar spine x-rays from same day. MRI lumbar spine dated April 19, 2019. FINDINGS: Segmentation:  Standard. Alignment: Unchanged 6 mm anterolisthesis at L3-L4 and 5 mm anterolisthesis at L4-L5. Vertebrae: New minimal L1 superior endplate compression fracture with approximately 10% height loss and trace retropulsion. Unchanged mild L3 and L4 compression deformity status post cement augmentation. No evidence of discitis or suspicious bone lesion. Similar degenerative endplate marrow edema at L2-L3. Conus medullaris and cauda equina: Conus extends to the L2 level. Conus and cauda equina appear normal. Paraspinal and other soft tissues: Unchanged bilateral renal cysts.  Unchanged 3.9 cm infrarenal abdominal aortic aneurysm. Disc levels: T12-L1: Negative disc. Unchanged mild bilateral facet arthropathy. No stenosis. L1-L2: Negative disc. Unchanged mild bilateral facet arthropathy. No stenosis. L2-L3: Unchanged disc bulging, endplate spurring, and moderate to severe bilateral facet arthropathy. Unchanged moderate spinal canal stenosis. Unchanged severe left and moderate right lateral recess stenosis. Unchanged moderate bilateral neuroforaminal stenosis. L3-L4: Prior bilateral laminectomies. Unchanged disc uncovering without significant bulge or herniation. Unchanged severe bilateral facet arthropathy. Unchanged moderate spinal canal and left greater than right lateral recess stenosis. Unchanged mild right neuroforaminal stenosis. No left neuroforaminal stenosis. L4-L5: Unchanged disc uncovering with right-sided disc osteophyte complex. Unchanged moderate bilateral facet arthropathy. Unchanged mild right greater than left lateral recess stenosis. No spinal canal or neuroforaminal stenosis. L5-S1: Negative disc. Unchanged mild bilateral facet arthropathy. Unchanged mild right neuroforaminal stenosis due to endplate spurring. No spinal canal or left neuroforaminal stenosis. IMPRESSION: 1. New acute minimal L1 superior endplate compression fracture with approximately 10% height loss and trace retropulsion. 2. Unchanged mild L3 and L4 compression deformity status post kyphoplasty. 3. Similar appearing multilevel spondylosis as described above with moderate stenosis at L2-L3 and L3-L4. Electronically Signed   By: Titus Dubin M.D.   On: 05/11/2019 10:29     Positive ROS: All other systems have been reviewed and were otherwise negative with the exception of those mentioned in the HPI and as above.  Physical Exam: BP (!) 160/65 (BP Location: Right Arm)   Pulse 66   Temp 97.8 F (36.6 C) (Oral)   Resp 16   Ht 5\' 3"  (1.6 m)   Wt 59 kg   SpO2 94%   BMI 23.03 kg/m  General:   Alert, no acute distress Psychiatric:  Patient is competent for consent with normal mood and affect   Cardiovascular:  No pedal edema, regular rate and rhythm Respiratory:  No wheezing, non-labored breathing GI:  Abdomen is soft and non-tender Skin:  No lesions in the area of chief complaint, no erythema Neurologic:  Sensation intact distally, CN grossly intact Lymphatic:  No axillary or cervical lymphadenopathy  Orthopedic Exam:  Bilateral lower extremities: - 5/5 DF/PF/EHL, KF/KE, HF - SILT  L2-S1 distributions - Feet wwp - No pathologic reflexes - No pathologic clonus; normal Babinksi sign - Negative bilateral straight leg raise - TTP most prominently over midline lumbar spine.     Imaging: As noted above, new compression fracture of L1, likely compression fracture of L2 as well.  Additionally MRI shows significant edema at L1 and L2 in addition to prior kyphoplasty at L3 and L4.  Assessment/Plan: Veronica Hubbard is a 84 y.o. female with a history of L3 and L4 kyphoplasty's in the last month with new compression fracture at L1 and possibly at L2 as well given the significant bony edema at L1 and L2. 1. We discussed both operative and non-operative treatment. Patient wished to pursue surgery in the form of L1 & L2 kyphoplasty. I have discussed this with Dr. Rudene Christians who will plan to perform this procedure on 05/13/19.  2. Plan for Dr. Rudene Christians to see patient and finalize plan tomorrow AM. Please keep NPO after midnight in case plan changes. 3. Pain control per primary team.   Leim Fabry   05/11/2019 3:11 PM

## 2019-05-11 NOTE — Progress Notes (Signed)
Pt admitted to room 145, A&O X4, placed on tele. Call bell in reach, bed alarm on.

## 2019-05-11 NOTE — Progress Notes (Signed)
Care Alignment Note  Advanced Directives Documents (Living Will, Power of Attorney) currently in the EHR no advanced directives documents available .  Has the patient discussed their wishes with their family/healthcare power of attorney no.  What does the patient/decision maker understand about their medical condition and the natural course of their disease.  Intractable back pain secondary to lumbar spine compression fracture.  Uncontrolled hypertension.  Chronic leukocytosis secondary to MDS.  What is the patient/decision maker's biggest fear or concern for the future becoming a burden to my family  What is the most important goal for this patient should their health condition worsen maintenance of function.  Current   Code Status: Full Code  Current code status has been reviewed/updated.  Time spent:19 minutes

## 2019-05-11 NOTE — ED Notes (Signed)
ED TO INPATIENT HANDOFF REPORT  ED Nurse Name and Phone #: Anderson Malta [phone]  S Name/Age/Gender Veronica Hubbard 84 y.o. female Room/Bed: ED16A/ED16A  Code Status   Code Status: Full Code  Home/SNF/Other Rehab Patient oriented to: self, place, time and situation Is this baseline? Yes   Triage Complete: Triage complete  Chief Complaint [facility] Back Pain  Triage Note Patient brought in by ems from home. Patient had back surgery about 2 weeks ago. Patient reports that she continues to have back pain with no improvement with oxycodone. Patient was given 100 mcg fentanyl by ems.    Allergies Allergies  Allergen Reactions  . Aspirin Shortness Of Breath  . Celecoxib Shortness Of Breath  . Iodinated Diagnostic Agents Other (See Comments)    Reaction:  Decreased kidney function  . Losartan Itching  . Morphine Nausea And Vomiting  . Verapamil Itching    Level of Care/Admitting Diagnosis ED Disposition    ED Disposition Condition [place]: [place] [[id number]]  Level of Care: Med-Surg [16]  Covid Evaluation: Asymptomatic Screening Protocol (No Symptoms)  Diagnosis: Compression fracture of lumbar spine, non-traumatic [facility]) [[id number]]  Admitting Physician: Otila Back [[id number]]  Attending Physician: Otila Back [[id number]]  Estimated length of stay: 3 - 4 days  Certification:: I certify this patient will need inpatient services for at least 2 midnights  PT Class (Do Not Modify): Inpatient [101]  PT Acc Code (Do Not Modify): Private [1]       B Medical/Surgery History Past Medical History:  Diagnosis Date  . Asthma   . Cancer ([place]) 02/2018   bone marrow cancer  . Depression   . GERD (gastroesophageal reflux disease)   . Hypertension   . Hypothyroidism   . Personal history of chemotherapy   . Spinal stenosis    Past Surgical History:  Procedure Laterality Date  . ABDOMINAL HYSTERECTOMY    . BACK SURGERY    . BREAST  BIOPSY Left 2003   neg  . cataracts    . KYPHOPLASTY N/A 04/22/2019   Procedure: KYPHOPLASTY;  Surgeon: Hessie Knows, MD;  Location: [facility] ORS;  Service: Orthopedics;  Laterality: N/A;  . KYPHOPLASTY N/A 04/10/2019   Procedure: KYPHOPLASTY L3;  Surgeon: Hessie Knows, MD;  Location: [facility] ORS;  Service: Orthopedics;  Laterality: N/A;  . lipoma removal    . SHOULDER SURGERY       A IV Location/Drains/Wounds Patient Lines/Drains/Airways Status   Active Line/Drains/Airways    Name:   Placement date:   Placement time:   Site:   Days:   Peripheral IV 05/11/19 Left Antecubital   05/11/19    0656    Antecubital   less than 1   Incision (Closed) 04/10/19 Back   04/10/19    1944     31   Incision (Closed) 04/22/19 Back Other (Comment)   04/22/19    1537     19          Intake/Output Last 24 hours No intake or output data in the 24 hours ending 05/11/19 1248  Labs/Imaging Results for orders placed or performed during the hospital encounter of 05/11/19 (from the past 48 hour(s))  CBC with Differential     Status: Abnormal   Collection Time: 05/11/19  6:54 AM  Result Value Ref Range   WBC 26.0 (H) 4.0 - 10.5 K/uL   RBC 4.53 3.87 - 5.11 MIL/uL   Hemoglobin 12.5 12.0 - 15.0 g/dL  HCT 39.6 36.0 - 46.0 %   MCV 87.4 80.0 - 100.0 fL   MCH 27.6 26.0 - 34.0 pg   MCHC 31.6 30.0 - 36.0 g/dL   RDW 21.0 (H) 11.5 - 15.5 %   Platelets 318 150 - 400 K/uL   nRBC 0.5 (H) 0.0 - 0.2 %   Neutrophils Relative % 69 %   Neutro Abs 17.8 (H) 1.7 - 7.7 K/uL   Lymphocytes Relative 11 %   Lymphs Abs 2.8 0.7 - 4.0 K/uL   Monocytes Relative 3 %   Monocytes Absolute 0.9 0.1 - 1.0 K/uL   Eosinophils Relative 4 %   Eosinophils Absolute 1.1 (H) 0.0 - 0.5 K/uL   Basophils Relative 2 %   Basophils Absolute 0.6 (H) 0.0 - 0.1 K/uL   WBC Morphology MILD LEFT SHIFT (1-5% METAS, OCC MYELO, OCC BANDS)    RBC Morphology MICROCYTES    Smear Review Normal platelet morphology    Immature Granulocytes 11 %   Abs Immature  Granulocytes 2.74 (H) 0.00 - 0.07 K/uL    Comment: Performed at [facility], [address] [place], [place] [address]  Comprehensive metabolic panel     Status: Abnormal   Collection Time: 05/11/19  6:54 AM  Result Value Ref Range   Sodium 137 135 - 145 mmol/L   Potassium 3.6 3.5 - 5.1 mmol/L   Chloride 103 98 - 111 mmol/L   CO2 27 22 - 32 mmol/L   Glucose, Bld 96 70 - 99 mg/dL   BUN 24 (H) 8 - 23 mg/dL   Creatinine, Ser 1.21 (H) 0.44 - 1.00 mg/dL   Calcium 10.6 (H) 8.9 - 10.3 mg/dL   Total Protein 6.6 6.5 - 8.1 g/dL   Albumin 3.8 3.5 - 5.0 g/dL   AST 17 15 - 41 U/L   ALT 6 0 - 44 U/L   Alkaline Phosphatase 75 38 - 126 U/L   Total Bilirubin 0.6 0.3 - 1.2 mg/dL   GFR calc non Af Amer 42 (L) >60 mL/min   GFR calc Af Amer 48 (L) >60 mL/min   Anion gap 7 5 - 15    Comment: Performed at [facility], [address] [place], [place] [address]  Urinalysis, Complete w Microscopic     Status: Abnormal   Collection Time: 05/11/19 11:18 AM  Result Value Ref Range   Color, Urine YELLOW (A) YELLOW   APPearance HAZY (A) CLEAR   Specific Gravity, Urine 1.014 1.005 - 1.030   pH 6.0 5.0 - 8.0   Glucose, UA NEGATIVE NEGATIVE mg/dL   Hgb urine dipstick NEGATIVE NEGATIVE   Bilirubin Urine NEGATIVE NEGATIVE   Ketones, ur NEGATIVE NEGATIVE mg/dL   Protein, ur NEGATIVE NEGATIVE mg/dL   Nitrite NEGATIVE NEGATIVE   Leukocytes,Ua MODERATE (A) NEGATIVE   RBC / HPF 0-5 0 - 5 RBC/hpf   WBC, UA >50 (H) 0 - 5 WBC/hpf   Bacteria, UA MANY (A) NONE SEEN   Squamous Epithelial / LPF 0-5 0 - 5   Mucus PRESENT     Comment: Performed at [facility], [address], [place], [place] [address]  SARS Coronavirus 2 (CEPHEID - Performed in [facility] hospital lab), Hosp Order     Status: None   Collection Time: 05/11/19 11:18 AM   Specimen: Nasopharyngeal Swab  Result Value Ref Range   SARS Coronavirus 2 NEGATIVE NEGATIVE    Comment: (NOTE) If result is NEGATIVE SARS-CoV-2  target nucleic acids are  NOT DETECTED. The SARS-CoV-2 RNA is generally detectable in upper and lower  respiratory specimens during the acute phase of infection. The lowest  concentration of SARS-CoV-2 viral copies this assay can detect is 250  copies / mL. A negative result does not preclude SARS-CoV-2 infection  and should not be used as the sole basis for treatment or other  patient management decisions.  A negative result may occur with  improper specimen collection / handling, submission of specimen other  than nasopharyngeal swab, presence of viral mutation(s) within the  areas targeted by this assay, and inadequate number of viral copies  (<250 copies / mL). A negative result must be combined with clinical  observations, patient history, and epidemiological information. If result is POSITIVE SARS-CoV-2 target nucleic acids are DETECTED. The SARS-CoV-2 RNA is generally detectable in upper and lower  respiratory specimens dur ing the acute phase of infection.  Positive  results are indicative of active infection with SARS-CoV-2.  Clinical  correlation with patient history and other diagnostic information is  necessary to determine patient infection status.  Positive results do  not rule out bacterial infection or co-infection with other viruses. If result is PRESUMPTIVE POSTIVE SARS-CoV-2 nucleic acids MAY BE PRESENT.   A presumptive positive result was obtained on the submitted specimen  and confirmed on repeat testing.  While 2019 novel coronavirus  (SARS-CoV-2) nucleic acids may be present in the submitted sample  additional confirmatory testing may be necessary for epidemiological  and / or clinical management purposes  to differentiate between  SARS-CoV-2 and other Sarbecovirus currently known to infect humans.  If clinically indicated additional testing with an alternate test  methodology [phone]) is advised. The SARS-CoV-2 RNA is generally  detectable in upper and lower  respiratory sp ecimens during the acute  phase of infection. The expected result is Negative. Fact Sheet for Patients:  StrictlyIdeas.no Fact Sheet for Healthcare Providers: BankingDealers.co.za This test is not yet approved or cleared by the [place] FDA and has been authorized for detection and/or diagnosis of SARS-CoV-2 by FDA under an Emergency Use Authorization (EUA).  This EUA will remain in effect (meaning this test can be used) for the duration of the COVID-19 declaration under Section 564(b)(1) of the Act, 21 U.S.C. section 360bbb-3(b)(1), unless the authorization is terminated or revoked sooner. Performed at [facility], [address], [place], [place] [address]    Dg Lumbar Spine 2-3 Views  Result Date: 05/11/2019 CLINICAL DATA:  Unresolved low back pain. Unable to walk or stand. Kyphoplasty 04/22/2019 EXAM: LUMBAR SPINE - 2-3 VIEW COMPARISON:  05/02/2019.  CT 04/24/2019.  MRI 04/19/2019 FINDINGS: Previously augmented fractures at L3 and L4 as seen previously. No evidence of progression at those 2 levels. Chronic anterolisthesis at L3-4 of 7 mm. I think there is a new fracture at L1 with loss of height of about 10%. No other change. IMPRESSION: Suspicion of a new fracture at L1 with loss of height of about 10%. Electronically Signed   By: Nelson Chimes M.D.   On: 05/11/2019 07:55   Mr Lumbar Spine Wo Contrast  Result Date: 05/11/2019 CLINICAL DATA:  Recent L3 and L4 kyphoplasties. Continued back pain. Suspected new L1 compression fracture on x-ray. EXAM: MRI LUMBAR SPINE WITHOUT CONTRAST TECHNIQUE: Multiplanar, multisequence MR imaging of the lumbar spine was performed. No intravenous contrast was administered. COMPARISON:  Lumbar spine x-rays from same day. MRI lumbar spine dated April 19, 2019. FINDINGS: Segmentation:  Standard. Alignment: Unchanged 6 mm anterolisthesis at  L3-L4 and 5 mm anterolisthesis at L4-L5.  Vertebrae: New minimal L1 superior endplate compression fracture with approximately 10% height loss and trace retropulsion. Unchanged mild L3 and L4 compression deformity status post cement augmentation. No evidence of discitis or suspicious bone lesion. Similar degenerative endplate marrow edema at L2-L3. Conus medullaris and cauda equina: Conus extends to the L2 level. Conus and cauda equina appear normal. Paraspinal and other soft tissues: Unchanged bilateral renal cysts. Unchanged 3.9 cm infrarenal abdominal aortic aneurysm. Disc levels: T12-L1: Negative disc. Unchanged mild bilateral facet arthropathy. No stenosis. L1-L2: Negative disc. Unchanged mild bilateral facet arthropathy. No stenosis. L2-L3: Unchanged disc bulging, endplate spurring, and moderate to severe bilateral facet arthropathy. Unchanged moderate spinal canal stenosis. Unchanged severe left and moderate right lateral recess stenosis. Unchanged moderate bilateral neuroforaminal stenosis. L3-L4: Prior bilateral laminectomies. Unchanged disc uncovering without significant bulge or herniation. Unchanged severe bilateral facet arthropathy. Unchanged moderate spinal canal and left greater than right lateral recess stenosis. Unchanged mild right neuroforaminal stenosis. No left neuroforaminal stenosis. L4-L5: Unchanged disc uncovering with right-sided disc osteophyte complex. Unchanged moderate bilateral facet arthropathy. Unchanged mild right greater than left lateral recess stenosis. No spinal canal or neuroforaminal stenosis. L5-S1: Negative disc. Unchanged mild bilateral facet arthropathy. Unchanged mild right neuroforaminal stenosis due to endplate spurring. No spinal canal or left neuroforaminal stenosis. IMPRESSION: 1. New acute minimal L1 superior endplate compression fracture with approximately 10% height loss and trace retropulsion. 2. Unchanged mild L3 and L4 compression deformity status post kyphoplasty. 3. Similar appearing multilevel  spondylosis as described above with moderate stenosis at L2-L3 and L3-L4. Electronically Signed   By: Titus Dubin M.D.   On: 05/11/2019 10:29    Pending Labs Unresulted Labs (From admission, onward)    Start     Ordered   05/12/19 1610  Basic metabolic panel  Tomorrow morning,   STAT     05/11/19 1220   05/12/19 0500  CBC  Tomorrow morning,   STAT     05/11/19 1220   05/12/19 0500  Magnesium  Tomorrow morning,   STAT     05/11/19 1220   05/12/19 0500  TSH  Tomorrow morning,   STAT     05/11/19 1220   05/11/19 0729  Urine culture  Once,   STAT     05/11/19 0729          Vitals/Pain Today's Vitals   05/11/19 0730 05/11/19 0855 05/11/19 1018 05/11/19 1030  BP: (!) 178/68 (!) 155/79  (!) 179/68  Pulse: (!) 59 60  65  Resp:  16  16  Temp:      TempSrc:      SpO2: 96% 96%  (!) 89%  Weight:      Height:      PainSc:  8  8      Isolation Precautions No active isolations  Medications Medications  acetaminophen (TYLENOL) tablet 650 mg (has no administration in time range)  allopurinol (ZYLOPRIM) tablet 200 mg (has no administration in time range)  oxyCODONE-acetaminophen (PERCOCET) 7.5-325 MG per tablet 2 tablet (has no administration in time range)  ezetimibe (ZETIA) tablet 10 mg (has no administration in time range)  hydrALAZINE (APRESOLINE) tablet 50 mg (has no administration in time range)  isosorbide mononitrate (IMDUR) 24 hr tablet 30 mg (has no administration in time range)  citalopram (CELEXA) tablet 20 mg (has no administration in time range)  levothyroxine (SYNTHROID) tablet 150 mcg (has no administration in time range)  pantoprazole (PROTONIX) EC tablet 40  mg (has no administration in time range)  polyethylene glycol (MIRALAX / GLYCOLAX) packet 17 g (has no administration in time range)  Melatonin TABS 5 mg (has no administration in time range)  cyclobenzaprine (FLEXERIL) tablet 5-10 mg (has no administration in time range)  budesonide (PULMICORT) nebulizer  solution 0.5 mg (has no administration in time range)  Fluticasone-Umeclidin-Vilant 100-62.5-25 MCG/INH AEPB 1 puff (has no administration in time range)  ipratropium-albuterol (DUONEB) 0.5-2.5 (3) MG/3ML nebulizer solution 3 mL (has no administration in time range)  triamcinolone cream (KENALOG) 0.5 % 1 application (has no administration in time range)  Vitamin D3 CAPS 1,000 Units (has no administration in time range)  enoxaparin (LOVENOX) injection 40 mg (has no administration in time range)  hydrALAZINE (APRESOLINE) injection 10 mg (has no administration in time range)  HYDROmorphone (DILAUDID) injection 0.5 mg (has no administration in time range)  HYDROmorphone (DILAUDID) injection 0.5 mg (0.5 mg Intravenous Given 05/11/19 0852)  ondansetron (ZOFRAN) injection 4 mg (4 mg Intravenous Given 05/11/19 0852)  methylnaltrexone (RELISTOR) injection 6 mg (6 mg Subcutaneous Given 05/11/19 1017)  HYDROmorphone (DILAUDID) injection 0.5 mg (0.5 mg Intravenous Given 05/11/19 1141)    Mobility walks with device     Focused Assessments Cardiac Assessment Handoff:    Lab Results  Component Value Date   TROPONINI <0.03 02/24/2016   No results found for: DDIMER Does the Patient currently have chest pain? No     R Recommendations: See Admitting Provider Note  Report given to:   Additional Notes:

## 2019-05-11 NOTE — ED Notes (Signed)
Pt resting on ER stretcher with eyes closed, respirations even and nonlabored.  Pt responds to gentle touch and calling of her name.  Pain reports no change in pain level at this time.

## 2019-05-11 NOTE — H&P (Signed)
[facility] NAME: Veronica Hubbard    MR#:  [id number]  DATE OF BIRTH:  1935/01/30  DATE OF ADMISSION:  05/11/2019  PRIMARY CARE PHYSICIAN: Zhou-Talbert, Elwyn Lade, MD   REQUESTING/REFERRING PHYSICIAN: Carrie Mew  CHIEF COMPLAINT:   Chief Complaint  Patient presents with  . Back Pain    HISTORY OF PRESENT ILLNESS:  Veronica Hubbard  is a 84 y.o. 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  Denied having any fall.  Claims to be following all instructions given.  States the pain never completely went away but got worse and had to be brought back to the emergency room for further evaluation today.  Patient was evaluated in the emergency room.  Laboratory studies unremarkable.  COVID test done but results still pending.  Patient had MRI of the lumbar spine done today which revealed new acute minimal L1 superior endplate compression fracture withapproximately 10% height loss and trace retropulsion. Unchanged mild L3 and L4 compression deformity status post kyphoplasty.  Emergency room provider discussed case with orthopedic physician on-call Dr. Posey Pronto who recommended having patient admitted to medical service.  Orthopedic service will see patient in consultation with anticipation of possible surgery tomorrow.  PAST MEDICAL HISTORY:   Past Medical History:  Diagnosis Date  . Asthma   . Cancer ([place]) 02/2018   bone marrow cancer  . Depression   . GERD (gastroesophageal reflux disease)   . Hypertension   . Hypothyroidism   . Personal history of chemotherapy   . Spinal stenosis     PAST SURGICAL HISTORY:   Past Surgical History:  Procedure Laterality Date   . ABDOMINAL HYSTERECTOMY    . BACK SURGERY    . BREAST BIOPSY Left 2003   neg  . cataracts    . KYPHOPLASTY N/A 04/22/2019   Procedure: KYPHOPLASTY;  Surgeon: Hessie Knows, MD;  Location: [facility] ORS;  Service: Orthopedics;  Laterality: N/A;  . KYPHOPLASTY N/A 04/10/2019   Procedure: KYPHOPLASTY L3;  Surgeon: Hessie Knows, MD;  Location: [facility] ORS;  Service: Orthopedics;  Laterality: N/A;  . lipoma removal    . SHOULDER SURGERY      SOCIAL HISTORY:   Social History   Tobacco Use  . Smoking status: Former Smoker    Types: Cigarettes  . Smokeless tobacco: Never Used  Substance Use Topics  . Alcohol use: No    FAMILY HISTORY:   Family History  Problem Relation Age of Onset  . Leukemia Mother   . CAD Father   . Diabetes Father   . Breast cancer Sister 36    DRUG ALLERGIES:   Allergies  Allergen Reactions  . Aspirin Shortness Of Breath  . Celecoxib Shortness Of Breath  . Iodinated Diagnostic Agents Other (See Comments)    Reaction:  Decreased kidney function  . Losartan Itching  . Morphine Nausea And Vomiting  . Verapamil Itching    REVIEW OF SYSTEMS:   Review of Systems  Constitutional: Negative for chills and fever.  HENT: Negative for hearing loss and tinnitus.   Eyes: Negative for blurred vision.  Respiratory: Negative for cough and hemoptysis.   Cardiovascular: Negative for chest pain and palpitations.  Gastrointestinal: Negative for heartburn, nausea and vomiting.  Genitourinary: Negative  for dysuria.  Musculoskeletal: Positive for back pain. Negative for falls.  Skin: Negative for itching and rash.  Neurological: Negative for dizziness and headaches.  Psychiatric/Behavioral: Negative for depression and hallucinations.    MEDICATIONS AT HOME:   Prior to Admission medications   Medication Sig Start Date End Date Taking? Authorizing Provider  acetaminophen (TYLENOL) 325 MG tablet Take 2 tablets (650 mg total) by mouth every 6 (six) hours as needed for  mild pain (or Fever >/= 101). 04/27/19  Yes Mayo, Pete Pelt, MD  allopurinol (ZYLOPRIM) 100 MG tablet Take 200 mg by mouth daily. 05/04/18  Yes [provider]  budesonide (PULMICORT) 0.5 MG/2ML nebulizer solution Take 0.5 mg by nebulization 2 (two) times daily.   Yes [provider]  Cholecalciferol (VITAMIN D3) 25 MCG (1000 UT) CAPS Take 1,000 Units by mouth daily.   Yes [provider]  citalopram (CELEXA) 20 MG tablet Take 20 mg by mouth daily.   Yes [provider]  cyclobenzaprine (FLEXERIL) 5 MG tablet Take 5-10 mg by mouth at bedtime as needed for muscle spasms.   Yes [provider]  ezetimibe (ZETIA) 10 MG tablet Take 10 mg by mouth at bedtime.    Yes [provider]  Fluticasone-Umeclidin-Vilant 100-62.5-25 MCG/INH AEPB Inhale 1 puff into the lungs daily.   Yes [provider]  hydrALAZINE (APRESOLINE) 50 MG tablet Take 1 tablet (50 mg total) by mouth every 8 (eight) hours. 04/27/19  Yes Mayo, Pete Pelt, MD  ipratropium-albuterol (DUONEB) 0.5-2.5 (3) MG/3ML SOLN Take 3 mLs by nebulization 4 (four) times daily as needed.   Yes [provider]  isosorbide mononitrate (IMDUR) 30 MG 24 hr tablet Take 30 mg by mouth daily.   Yes [provider]  levothyroxine (SYNTHROID) 150 MCG tablet Take 1 tablet (150 mcg total) by mouth daily before breakfast. 04/28/19  Yes Mayo, Pete Pelt, MD  Melatonin 5 MG TABS Take 5 mg by mouth at bedtime.   Yes [provider]  oxyCODONE-acetaminophen (PERCOCET) 7.5-325 MG tablet Take 1 tablet by mouth every 6 (six) hours as needed. 05/05/19  Yes [provider]  pantoprazole (PROTONIX) 40 MG tablet Take 40 mg by mouth 2 (two) times daily.   Yes [provider]  polyethylene glycol (MIRALAX / GLYCOLAX) 17 g packet Take 17 g by mouth daily as needed. 04/27/19  Yes Mayo, Pete Pelt, MD  triamcinolone cream (KENALOG) 0.5 % Apply 1 application topically 3 (three) times  daily.   Yes [provider]  oxyCODONE (ROXICODONE) 15 MG immediate release tablet Take 0.5 tablets (7.5 mg total) by mouth every 6 (six) hours as needed for moderate pain. Patient not taking: Reported on 05/11/2019 04/27/19   Mayo, Pete Pelt, MD      VITAL SIGNS:  Blood pressure (!) 179/68, pulse 65, temperature 98.4 F (36.9 C), temperature source Oral, resp. rate 16, height 5\' 3"  (1.6 m), weight 59 kg, SpO2 (!) 89 %.  PHYSICAL EXAMINATION:  Physical Exam  GENERAL:  84 y.o.-year-old patient lying in the bed with no acute distress.  EYES: Pupils equal, round, reactive to light and accommodation. No scleral icterus. Extraocular muscles intact.  HEENT: Head atraumatic, normocephalic. Oropharynx and nasopharynx clear.  NECK:  Supple, no jugular venous distention. No thyroid enlargement, no tenderness.  LUNGS: Normal breath sounds bilaterally, no wheezing, rales,rhonchi or crepitation. No use of accessory muscles of respiration.  CARDIOVASCULAR: S1, S2 normal. No murmurs, rubs, or gallops.  ABDOMEN: Soft, nontender, nondistended. Bowel sounds  present. No organomegaly or mass.  EXTREMITIES: No pedal edema, cyanosis, or clubbing.  NEUROLOGIC: Full neuro examination not done due to patient complaining of back pain when she moves around in bed.   PSYCHIATRIC: The patient is alert and oriented x 3.  SKIN: No obvious rash, lesion, or ulcer.   LABORATORY PANEL:   CBC Recent Labs  Lab 05/11/19 0654  WBC 26.0*  HGB 12.5  HCT 39.6  PLT 318   ------------------------------------------------------------------------------------------------------------------  Chemistries  Recent Labs  Lab 05/11/19 0654  NA 137  K 3.6  CL 103  CO2 27  GLUCOSE 96  BUN 24*  CREATININE 1.21*  CALCIUM 10.6*  AST 17  ALT 6  ALKPHOS 75  BILITOT 0.6   ------------------------------------------------------------------------------------------------------------------  Cardiac Enzymes No results  for input(s): TROPONINI in the last 168 hours. ------------------------------------------------------------------------------------------------------------------  RADIOLOGY:  Dg Lumbar Spine 2-3 Views  Result Date: 05/11/2019 CLINICAL DATA:  Unresolved low back pain. Unable to walk or stand. Kyphoplasty 04/22/2019 EXAM: LUMBAR SPINE - 2-3 VIEW COMPARISON:  05/02/2019.  CT 04/24/2019.  MRI 04/19/2019 FINDINGS: Previously augmented fractures at L3 and L4 as seen previously. No evidence of progression at those 2 levels. Chronic anterolisthesis at L3-4 of 7 mm. I think there is a new fracture at L1 with loss of height of about 10%. No other change. IMPRESSION: Suspicion of a new fracture at L1 with loss of height of about 10%. Electronically Signed   By: Nelson Chimes M.D.   On: 05/11/2019 07:55   Mr Lumbar Spine Wo Contrast  Result Date: 05/11/2019 CLINICAL DATA:  Recent L3 and L4 kyphoplasties. Continued back pain. Suspected new L1 compression fracture on x-ray. EXAM: MRI LUMBAR SPINE WITHOUT CONTRAST TECHNIQUE: Multiplanar, multisequence MR imaging of the lumbar spine was performed. No intravenous contrast was administered. COMPARISON:  Lumbar spine x-rays from same day. MRI lumbar spine dated April 19, 2019. FINDINGS: Segmentation:  Standard. Alignment: Unchanged 6 mm anterolisthesis at L3-L4 and 5 mm anterolisthesis at L4-L5. Vertebrae: New minimal L1 superior endplate compression fracture with approximately 10% height loss and trace retropulsion. Unchanged mild L3 and L4 compression deformity status post cement augmentation. No evidence of discitis or suspicious bone lesion. Similar degenerative endplate marrow edema at L2-L3. Conus medullaris and cauda equina: Conus extends to the L2 level. Conus and cauda equina appear normal. Paraspinal and other soft tissues: Unchanged bilateral renal cysts. Unchanged 3.9 cm infrarenal abdominal aortic aneurysm. Disc levels: T12-L1: Negative disc. Unchanged mild  bilateral facet arthropathy. No stenosis. L1-L2: Negative disc. Unchanged mild bilateral facet arthropathy. No stenosis. L2-L3: Unchanged disc bulging, endplate spurring, and moderate to severe bilateral facet arthropathy. Unchanged moderate spinal canal stenosis. Unchanged severe left and moderate right lateral recess stenosis. Unchanged moderate bilateral neuroforaminal stenosis. L3-L4: Prior bilateral laminectomies. Unchanged disc uncovering without significant bulge or herniation. Unchanged severe bilateral facet arthropathy. Unchanged moderate spinal canal and left greater than right lateral recess stenosis. Unchanged mild right neuroforaminal stenosis. No left neuroforaminal stenosis. L4-L5: Unchanged disc uncovering with right-sided disc osteophyte complex. Unchanged moderate bilateral facet arthropathy. Unchanged mild right greater than left lateral recess stenosis. No spinal canal or neuroforaminal stenosis. L5-S1: Negative disc. Unchanged mild bilateral facet arthropathy. Unchanged mild right neuroforaminal stenosis due to endplate spurring. No spinal canal or left neuroforaminal stenosis. IMPRESSION: 1. New acute minimal L1 superior endplate compression fracture with approximately 10% height loss and trace retropulsion. 2. Unchanged mild L3 and L4 compression deformity status post kyphoplasty. 3. Similar appearing multilevel spondylosis as described above with  moderate stenosis at L2-L3 and L3-L4. Electronically Signed   By: Titus Dubin M.D.   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  Presented back to the emergency room with complaints of  worsening back pain.    1.  Back pain secondary to compression fracture. Patient recently discharged from the hospital following kyphoplasty for L4 compression fracture.  Was discharged to skilled nursing facility and subsequently home.  Back with worsening back pains. MRI of the lumbar spine revealed Patient had MRI of the lumbar spine done today which revealed new acute minimal L1 superior endplate compression fracture withapproximately 10% height loss and trace retropulsion. Unchanged mild L3 and L4 compression deformity status post Kyphoplasty. Emergency room provider discussed case with orthopedic physician on-call Dr. Posey Pronto.  Consult placed and message sent via epic. No urinary incontinence or retention.  No fecal incontinence.  Patient reports ambulating with assistance at home prior to admission today. Keep patient n.p.o. after midnight in case of plans for surgery Resumed pain meds.  Patient allergic to morphine  2.  Hypertension; uncontrolled Likely due to pains. Resumed home blood pressure meds.  Placed on PRN IV hydralazine with parameters.  3.  Hypothyroidism. Resume Synthroid.  TSH level in a.m.  4.  History of depression Stable.  Continue Celexa.  5.  History of asthma Stable. Could be test requested.  Results still pending.  6.  Chronic leukocytosis secondary to history of MDS/MPN and was on Jakafi in the past Seen by oncologist during last admission. To follow-up with her oncologist at [facility] post discharge from the hospital.  7.  History of AAA- incidental finding of 3.9cm AAA, seen on CT renal stone study -Needs f/u US in 2 years  DVT prophylaxis; Lovenox  All the records are reviewed and case discussed with ED provider. Management plans discussed with the patient, family and they are in agreement.  CODE STATUS: Full code TOTAL TIME TAKING CARE OF THIS PATIENT: 59 minutes.    Mickaela Starlin M.D on 05/11/2019 at 12:21 PM  Between 7am to 6pm - Pager  - [phone]  After 6pm go to [email or web address] - Proofreader  [employer]  [facility]  Office  [phone]  CC: Primary care physician; Zhou-Talbert, Elwyn Lade, MD   Note: This dictation was prepared with Dragon dictation along with smaller phrase technology. Any transcriptional errors that result from this process are unintentional.

## 2019-05-11 NOTE — ED Provider Notes (Signed)
[facility] Emergency Department Provider Note  ____________________________________________  Time seen: Approximately 8:18 AM  I have reviewed the triage vital signs and the nursing notes.   HISTORY  Chief Complaint Back Pain    HPI Veronica Hubbard is a 84 y.o. female with a history of AAA, GERD, hypertension, lumbar compression fractures status post kyphoplasty of L3 and L4 on April 22, 2019  who comes the ED complaining of severe low back pain which feels similar to the pain she has been having over the past month related to her compression fractures.  She had an initial kyphoplasty done on June 4 which was unsuccessful at relieving her symptoms, then was found to have a new compression fracture of L4 later and so underwent procedure to repeat kypho L3 and new kypho L4.  She was discharged from the hospital and had a short stay at SNF, recently returned home.  She reports doing physical therapy but not getting any better.  Still has severe pain despite taking oxycodone.  Also feels constipated despite taking Colace.  Pain is nonradiating, worse with movement and being upright.  No alleviating factors.  Aching.  Notably on June 26 she had a urinalysis which was positive for MRSA and enterococcus faecalis.  She reports taking antibiotics, but does not remember the name.  The sensitivity report shows multiple resistances with Macrobid the only potential single oral agent.   Past Medical History:  Diagnosis Date  . Asthma   . Cancer ([place]) 02/2018   bone marrow cancer  . Depression   . GERD (gastroesophageal reflux disease)   . Hypertension   . Hypothyroidism   . Personal history of chemotherapy   . Spinal stenosis      Patient Active Problem List   Diagnosis Date Noted  . Compression fracture of lumbar spine, non-traumatic ([place]) 05/11/2019  . Intractable pain 04/19/2019  . AAA (abdominal aortic aneurysm) without rupture ([place]) 04/09/2017  .  Bilateral carotid artery stenosis 04/09/2017  . Thoracic aortic aneurysm without rupture ([place]) 04/09/2017  . Hyponatremia 02/23/2016  . Chest pain 02/23/2016  . Gastroenteritis due to norovirus 02/09/2016     Past Surgical History:  Procedure Laterality Date  . ABDOMINAL HYSTERECTOMY    . BACK SURGERY    . BREAST BIOPSY Left 2003   neg  . cataracts    . KYPHOPLASTY N/A 04/22/2019   Procedure: KYPHOPLASTY;  Surgeon: Hessie Knows, MD;  Location: [facility] ORS;  Service: Orthopedics;  Laterality: N/A;  . KYPHOPLASTY N/A 04/10/2019   Procedure: KYPHOPLASTY L3;  Surgeon: Hessie Knows, MD;  Location: [facility] ORS;  Service: Orthopedics;  Laterality: N/A;  . lipoma removal    . SHOULDER SURGERY       Prior to Admission medications   Medication Sig Start Date End Date Taking? Authorizing Provider  acetaminophen (TYLENOL) 325 MG tablet Take 2 tablets (650 mg total) by mouth every 6 (six) hours as needed for mild pain (or Fever >/= 101). 04/27/19  Yes Mayo, Pete Pelt, MD  allopurinol (ZYLOPRIM) 100 MG tablet Take 200 mg by mouth daily. 05/04/18  Yes [provider]  budesonide (PULMICORT) 0.5 MG/2ML nebulizer solution Take 0.5 mg by nebulization 2 (two) times daily.   Yes [provider]  Cholecalciferol (VITAMIN D3) 25 MCG (1000 UT) CAPS Take 1,000 Units by mouth daily.   Yes [provider]  citalopram (CELEXA) 20 MG tablet Take 20 mg by mouth daily.   Yes [provider]  cyclobenzaprine (FLEXERIL) 5  MG tablet Take 5-10 mg by mouth at bedtime as needed for muscle spasms.   Yes [provider]  ezetimibe (ZETIA) 10 MG tablet Take 10 mg by mouth at bedtime.    Yes [provider]  Fluticasone-Umeclidin-Vilant 100-62.5-25 MCG/INH AEPB Inhale 1 puff into the lungs daily.   Yes [provider]  hydrALAZINE (APRESOLINE) 50 MG tablet Take 1 tablet (50 mg total) by mouth every 8 (eight) hours. 04/27/19  Yes Mayo, Pete Pelt, MD   ipratropium-albuterol (DUONEB) 0.5-2.5 (3) MG/3ML SOLN Take 3 mLs by nebulization 4 (four) times daily as needed.   Yes [provider]  isosorbide mononitrate (IMDUR) 30 MG 24 hr tablet Take 30 mg by mouth daily.   Yes [provider]  levothyroxine (SYNTHROID) 150 MCG tablet Take 1 tablet (150 mcg total) by mouth daily before breakfast. 04/28/19  Yes Mayo, Pete Pelt, MD  Melatonin 5 MG TABS Take 5 mg by mouth at bedtime.   Yes [provider]  oxyCODONE-acetaminophen (PERCOCET) 7.5-325 MG tablet Take 1 tablet by mouth every 6 (six) hours as needed. 05/05/19  Yes [provider]  pantoprazole (PROTONIX) 40 MG tablet Take 40 mg by mouth 2 (two) times daily.   Yes [provider]  polyethylene glycol (MIRALAX / GLYCOLAX) 17 g packet Take 17 g by mouth daily as needed. 04/27/19  Yes Mayo, Pete Pelt, MD  triamcinolone cream (KENALOG) 0.5 % Apply 1 application topically 3 (three) times daily.   Yes [provider]  oxyCODONE (ROXICODONE) 15 MG immediate release tablet Take 0.5 tablets (7.5 mg total) by mouth every 6 (six) hours as needed for moderate pain. Patient not taking: Reported on 05/11/2019 04/27/19   MayoPete Pelt, MD     Allergies Aspirin, Celecoxib, Iodinated diagnostic agents, Losartan, Morphine, and Verapamil   Family History  Problem Relation Age of Onset  . Leukemia Mother   . CAD Father   . Diabetes Father   . Breast cancer Sister 4    Social History Social History   Tobacco Use  . Smoking status: Former Smoker    Types: Cigarettes  . Smokeless tobacco: Never Used  Substance Use Topics  . Alcohol use: No  . Drug use: No    Review of Systems  Constitutional:   No fever or chills.  ENT:   No sore throat. No rhinorrhea. Cardiovascular:   No chest pain or syncope. Respiratory:   No dyspnea or cough. Gastrointestinal:   Negative for abdominal pain, vomiting and diarrhea.  Musculoskeletal:   Low back pain as  above All other systems reviewed and are negative except as documented above in ROS and HPI.  ____________________________________________   PHYSICAL EXAM:  VITAL SIGNS: ED Triage Vitals  Enc Vitals Group     BP 05/11/19 0651 (!) 182/77     Pulse Rate 05/11/19 0651 64     Resp 05/11/19 0651 18     Temp 05/11/19 0651 98.4 F (36.9 C)     Temp Source 05/11/19 0651 Oral     SpO2 05/11/19 0651 100 %     Weight 05/11/19 0652 130 lb (59 kg)     Height 05/11/19 0652 5\' 3"  (1.6 m)     Head Circumference --      [place] --      Pain Score 05/11/19 0652 9     Pain Loc --      Pain Edu? --      Excl. in [place]? --  Vital signs reviewed, nursing assessments reviewed.   Constitutional:   Alert and oriented. Non-toxic appearance. Eyes:   Conjunctivae are normal. EOMI. PERRL. ENT      Head:   Normocephalic and atraumatic.      Nose:   No congestion/rhinnorhea.       Mouth/Throat:   MMM, no pharyngeal erythema. No peritonsillar mass.       Neck:   No meningismus. Full ROM. Hematological/Lymphatic/Immunilogical:   No cervical lymphadenopathy. Cardiovascular:   RRR. Symmetric bilateral radial and DP pulses.  No murmurs. Cap refill less than 2 seconds. Respiratory:   Normal respiratory effort without tachypnea/retractions. Breath sounds are clear and equal bilaterally. No wheezes/rales/rhonchi. Gastrointestinal:   Soft and nontender. Non distended. There is no CVA tenderness.  No rebound, rigidity, or guarding.  Musculoskeletal:   Normal range of motion in all extremities. No joint effusions.  No lower extremity tenderness.  No edema.  There is midline spinal tenderness in the area of L1.  Prior incisions are well-healed, no inflammatory skin changes or palpable swelling Neurologic:   Normal speech and language.  Motor grossly intact. No acute focal neurologic deficits are appreciated.  Skin:    Skin is warm, dry and intact. No rash noted.  No petechiae, purpura, or  bullae.  ____________________________________________    LABS (pertinent positives/negatives) (all labs ordered are listed, but only abnormal results are displayed) Labs Reviewed  URINALYSIS, COMPLETE (UACMP) WITH MICROSCOPIC - Abnormal; Notable for the following components:      Result Value   Color, Urine YELLOW (*)    APPearance HAZY (*)    Leukocytes,Ua MODERATE (*)    WBC, UA >50 (*)    Bacteria, UA MANY (*)    All other components within normal limits  CBC WITH DIFFERENTIAL/PLATELET - Abnormal; Notable for the following components:   WBC 26.0 (*)    RDW 21.0 (*)    nRBC 0.5 (*)    Neutro Abs 17.8 (*)    Eosinophils Absolute 1.1 (*)    Basophils Absolute 0.6 (*)    Abs Immature Granulocytes 2.74 (*)    All other components within normal limits  COMPREHENSIVE METABOLIC PANEL - Abnormal; Notable for the following components:   BUN 24 (*)    Creatinine, Ser 1.21 (*)    Calcium 10.6 (*)    GFR calc non Af Amer 42 (*)    GFR calc Af Amer 48 (*)    All other components within normal limits  URINE CULTURE  SARS CORONAVIRUS 2 (HOSPITAL ORDER, [place] [facility])   ____________________________________________   EKG    ____________________________________________    RADIOLOGY  Dg Lumbar Spine 2-3 Views  Result Date: 05/11/2019 CLINICAL DATA:  Unresolved low back pain. Unable to walk or stand. Kyphoplasty 04/22/2019 EXAM: LUMBAR SPINE - 2-3 VIEW COMPARISON:  05/02/2019.  CT 04/24/2019.  MRI 04/19/2019 FINDINGS: Previously augmented fractures at L3 and L4 as seen previously. No evidence of progression at those 2 levels. Chronic anterolisthesis at L3-4 of 7 mm. I think there is a new fracture at L1 with loss of height of about 10%. No other change. IMPRESSION: Suspicion of a new fracture at L1 with loss of height of about 10%. Electronically Signed   By: Nelson Chimes M.D.   On: 05/11/2019 07:55   Mr Lumbar Spine Wo Contrast  Result Date:  05/11/2019 CLINICAL DATA:  Recent L3 and L4 kyphoplasties. Continued back pain. Suspected new L1 compression fracture on x-ray. EXAM: MRI LUMBAR  SPINE WITHOUT CONTRAST TECHNIQUE: Multiplanar, multisequence MR imaging of the lumbar spine was performed. No intravenous contrast was administered. COMPARISON:  Lumbar spine x-rays from same day. MRI lumbar spine dated April 19, 2019. FINDINGS: Segmentation:  Standard. Alignment: Unchanged 6 mm anterolisthesis at L3-L4 and 5 mm anterolisthesis at L4-L5. Vertebrae: New minimal L1 superior endplate compression fracture with approximately 10% height loss and trace retropulsion. Unchanged mild L3 and L4 compression deformity status post cement augmentation. No evidence of discitis or suspicious bone lesion. Similar degenerative endplate marrow edema at L2-L3. Conus medullaris and cauda equina: Conus extends to the L2 level. Conus and cauda equina appear normal. Paraspinal and other soft tissues: Unchanged bilateral renal cysts. Unchanged 3.9 cm infrarenal abdominal aortic aneurysm. Disc levels: T12-L1: Negative disc. Unchanged mild bilateral facet arthropathy. No stenosis. L1-L2: Negative disc. Unchanged mild bilateral facet arthropathy. No stenosis. L2-L3: Unchanged disc bulging, endplate spurring, and moderate to severe bilateral facet arthropathy. Unchanged moderate spinal canal stenosis. Unchanged severe left and moderate right lateral recess stenosis. Unchanged moderate bilateral neuroforaminal stenosis. L3-L4: Prior bilateral laminectomies. Unchanged disc uncovering without significant bulge or herniation. Unchanged severe bilateral facet arthropathy. Unchanged moderate spinal canal and left greater than right lateral recess stenosis. Unchanged mild right neuroforaminal stenosis. No left neuroforaminal stenosis. L4-L5: Unchanged disc uncovering with right-sided disc osteophyte complex. Unchanged moderate bilateral facet arthropathy. Unchanged mild right greater than left  lateral recess stenosis. No spinal canal or neuroforaminal stenosis. L5-S1: Negative disc. Unchanged mild bilateral facet arthropathy. Unchanged mild right neuroforaminal stenosis due to endplate spurring. No spinal canal or left neuroforaminal stenosis. IMPRESSION: 1. New acute minimal L1 superior endplate compression fracture with approximately 10% height loss and trace retropulsion. 2. Unchanged mild L3 and L4 compression deformity status post kyphoplasty. 3. Similar appearing multilevel spondylosis as described above with moderate stenosis at L2-L3 and L3-L4. Electronically Signed   By: Titus Dubin M.D.   On: 05/11/2019 10:29    ____________________________________________   PROCEDURES Procedures  ____________________________________________  DIFFERENTIAL DIAGNOSIS   Lumbar fracture, persistent UTI, electrolyte abnormality, dehydration  CLINICAL IMPRESSION / ASSESSMENT AND PLAN / ED COURSE  Medications ordered in the ED: Medications  acetaminophen (TYLENOL) tablet 650 mg (has no administration in time range)  allopurinol (ZYLOPRIM) tablet 200 mg (has no administration in time range)  oxyCODONE-acetaminophen (PERCOCET) 7.5-325 MG per tablet 2 tablet (has no administration in time range)  ezetimibe (ZETIA) tablet 10 mg (has no administration in time range)  hydrALAZINE (APRESOLINE) tablet 50 mg (has no administration in time range)  isosorbide mononitrate (IMDUR) 24 hr tablet 30 mg (has no administration in time range)  citalopram (CELEXA) tablet 20 mg (has no administration in time range)  levothyroxine (SYNTHROID) tablet 150 mcg (has no administration in time range)  pantoprazole (PROTONIX) EC tablet 40 mg (has no administration in time range)  polyethylene glycol (MIRALAX / GLYCOLAX) packet 17 g (has no administration in time range)  Melatonin TABS 5 mg (has no administration in time range)  cyclobenzaprine (FLEXERIL) tablet 5-10 mg (has no administration in time range)   budesonide (PULMICORT) nebulizer solution 0.5 mg (has no administration in time range)  Fluticasone-Umeclidin-Vilant 100-62.5-25 MCG/INH AEPB 1 puff (has no administration in time range)  ipratropium-albuterol (DUONEB) 0.5-2.5 (3) MG/3ML nebulizer solution 3 mL (has no administration in time range)  triamcinolone cream (KENALOG) 0.5 % 1 application (has no administration in time range)  Vitamin D3 CAPS 1,000 Units (has no administration in time range)  enoxaparin (LOVENOX) injection 40 mg (has no administration  in time range)  hydrALAZINE (APRESOLINE) injection 10 mg (has no administration in time range)  HYDROmorphone (DILAUDID) injection 0.5 mg (0.5 mg Intravenous Given 05/11/19 0852)  ondansetron (ZOFRAN) injection 4 mg (4 mg Intravenous Given 05/11/19 0852)  methylnaltrexone (RELISTOR) injection 6 mg (6 mg Subcutaneous Given 05/11/19 1017)  HYDROmorphone (DILAUDID) injection 0.5 mg (0.5 mg Intravenous Given 05/11/19 1141)    Pertinent labs & imaging results that were available during my care of the patient were reviewed by me and considered in my medical decision making (see chart for details).  Zamoria Boss was evaluated in Emergency Department on 05/11/2019 for the symptoms described in the history of present illness. She was evaluated in the context of the global COVID-19 pandemic, which necessitated consideration that the patient might be at risk for infection with the SARS-CoV-2 virus that causes COVID-19. Institutional protocols and algorithms that pertain to the evaluation of patients at risk for COVID-19 are in a state of rapid change based on information released by regulatory bodies including the CDC and federal and state organizations. These policies and algorithms were followed during the patient's care in the ED.   Patient presents with persistent/recurrent low back pain after multiple recent kyphoplasty's.  No new falls or trauma.  Will get a lumbar x-ray, check labs to ensure  electrolytes and creatinine are okay and that her prior UTI has been eradicated.  ----------------------------------------- 8:26 AM on 05/11/2019 -----------------------------------------  Labs are okay, still waiting on UA.  Lumbar spine x-ray shows suspected L1 compression fracture.  Discussed with orthopedics Dr. Posey Pronto who recommends MRI lumbar spine.  ----------------------------------------- 12:22 PM on 05/11/2019 -----------------------------------------  MRI result discussed with Dr. Posey Pronto who will relay to Dr. Rudene Christians.  Care passed off to hospitalist for management of her severe intractable pain and preop planning.      ____________________________________________   FINAL CLINICAL IMPRESSION(S) / ED DIAGNOSES    Final diagnoses:  Closed compression fracture of body of L1 vertebra (HCC)  Intractable back pain     ED Discharge Orders    None      Portions of this note were generated with dragon dictation software. Dictation errors may occur despite best attempts at proofreading.   Carrie Mew, MD 05/11/19 [phone]

## 2019-05-12 LAB — CBC
HCT: 41.1 % (ref 36.0–46.0)
Hemoglobin: 12.9 g/dL (ref 12.0–15.0)
MCH: 28 pg (ref 26.0–34.0)
MCHC: 31.4 g/dL (ref 30.0–36.0)
MCV: 89.2 fL (ref 80.0–100.0)
Platelets: 282 10*3/uL (ref 150–400)
RBC: 4.61 MIL/uL (ref 3.87–5.11)
RDW: 21.1 % — ABNORMAL HIGH (ref 11.5–15.5)
WBC: 22.2 10*3/uL — ABNORMAL HIGH (ref 4.0–10.5)
nRBC: 0.6 % — ABNORMAL HIGH (ref 0.0–0.2)

## 2019-05-12 LAB — BASIC METABOLIC PANEL
Anion gap: 6 (ref 5–15)
BUN: 21 mg/dL (ref 8–23)
CO2: 30 mmol/L (ref 22–32)
Calcium: 10.6 mg/dL — ABNORMAL HIGH (ref 8.9–10.3)
Chloride: 102 mmol/L (ref 98–111)
Creatinine, Ser: 1.14 mg/dL — ABNORMAL HIGH (ref 0.44–1.00)
GFR calc Af Amer: 52 mL/min — ABNORMAL LOW (ref >60)
GFR calc non Af Amer: 45 mL/min — ABNORMAL LOW (ref >60)
Glucose, Bld: 87 mg/dL (ref 70–99)
Potassium: 4 mmol/L (ref 3.5–5.1)
Sodium: 138 mmol/L (ref 135–145)

## 2019-05-12 LAB — TSH: TSH: 3.649 u[IU]/mL (ref 0.350–4.500)

## 2019-05-12 LAB — MAGNESIUM: Magnesium: 2.1 mg/dL (ref 1.7–2.4)

## 2019-05-12 MED ORDER — CEFAZOLIN SODIUM-DEXTROSE 1-4 GM/50ML-% IV SOLN
1.0000 g | Freq: Once | INTRAVENOUS | Status: DC
  Filled 2019-05-12: qty 50

## 2019-05-12 NOTE — TOC Progression Note (Signed)
Transition of Care [facility]) - Progression Note    Patient Details  Name: Leilanny Fluitt MRN: [id number] Date of Birth: [birth year]  Transition of Care [facility]) CM/SW [place], RN Phone Number: 05/12/2019, 1:20 PM  Clinical Narrative:     Sister called and stated that she spoke with the patient and they have decided they would like to go to [facility] [place].  I explained that we need to wait and see what her level of need is but I would go ahead and see if they have a bed available.  I completed the Fl2 and obtained the PASSR number and sent out the bed search  Expected Discharge Plan: [place] Barriers to Discharge: Continued Medical Work up  Expected Discharge Plan and Services Expected Discharge Plan: [place]   Discharge Planning Services: CM Consult Post Acute Care Choice: [place] arrangements for the past 2 months: Single Family Home                 DME Arranged: N/A         HH Arranged: PT HH Agency: [place] Date [place]: 05/12/19 Time [place]: 1133 Representative spoke with at [place]: [place] ([place]) Interventions    Readmission Risk Interventions No flowsheet data found.

## 2019-05-12 NOTE — Progress Notes (Signed)
[facility] [place] NAME: Veronica Hubbard    MR#:  [id number]  DATE OF BIRTH:  [birth year]  SUBJECTIVE:  CHIEF COMPLAINT:   Chief Complaint  Patient presents with  . Back Pain   Recent surgery on lumbar spinal fractures with vertebral fusion.  Came again with significant back pain and noted to have L1 and L2 compression fractures. Significant pain today.  REVIEW OF SYSTEMS:  CONSTITUTIONAL: No fever, fatigue or weakness.  EYES: No blurred or double vision.  EARS, NOSE, AND THROAT: No tinnitus or ear pain.  RESPIRATORY: No cough, shortness of breath, wheezing or hemoptysis.  CARDIOVASCULAR: No chest pain, orthopnea, edema.  GASTROINTESTINAL: No nausea, vomiting, diarrhea or abdominal pain.  GENITOURINARY: No dysuria, hematuria.  ENDOCRINE: No polyuria, nocturia,  HEMATOLOGY: No anemia, easy bruising or bleeding SKIN: No rash or lesion. MUSCULOSKELETAL: No joint pain or arthritis.   NEUROLOGIC: No tingling, numbness, weakness.  PSYCHIATRY: No anxiety or depression.   ROS  DRUG ALLERGIES:   Allergies  Allergen Reactions  . Aspirin Shortness Of Breath  . Celecoxib Shortness Of Breath  . Iodinated Diagnostic Agents Other (See Comments)    Reaction:  Decreased kidney function  . Losartan Itching  . Morphine Nausea And Vomiting  . Verapamil Itching    VITALS:  Blood pressure (!) 179/62, pulse (!) 58, temperature 97.9 F (36.6 C), resp. rate 18, height 5\' 3"  (1.6 m), weight 59 kg, SpO2 100 %.  PHYSICAL EXAMINATION:  GENERAL:  84 y.o.-year-old patient lying in the bed with  acute distress- due to pain in back .  EYES: Pupils equal, round, reactive to light and accommodation. No scleral icterus. Extraocular muscles intact.  HEENT: Head atraumatic, normocephalic. Oropharynx and nasopharynx clear.  NECK:  Supple, no jugular venous distention. No thyroid enlargement, no tenderness.  LUNGS: Normal breath sounds bilaterally, no  wheezing, rales,rhonchi or crepitation. No use of accessory muscles of respiration.  CARDIOVASCULAR: S1, S2 normal. No murmurs, rubs, or gallops.  ABDOMEN: Soft, nontender, nondistended. Bowel sounds present. No organomegaly or mass.  Tender in the vertebral area around lumbar region. EXTREMITIES: No pedal edema, cyanosis, or clubbing.  NEUROLOGIC: Cranial nerves II through XII are intact. Muscle strength 4/5 in all extremities. Sensation intact. Gait not checked.  PSYCHIATRIC: The patient is alert and oriented x 3.  SKIN: No obvious rash, lesion, or ulcer.   Physical Exam LABORATORY PANEL:   CBC Recent Labs  Lab 05/12/19 0458  WBC 22.2*  HGB 12.9  HCT 41.1  PLT 282   ------------------------------------------------------------------------------------------------------------------  Chemistries  Recent Labs  Lab 05/11/19 0654 05/12/19 0458  NA 137 138  K 3.6 4.0  CL 103 102  CO2 27 30  GLUCOSE 96 87  BUN 24* 21  CREATININE 1.21* 1.14*  CALCIUM 10.6* 10.6*  MG  --  2.1  AST 17  --   ALT 6  --   ALKPHOS 75  --   BILITOT 0.6  --    ------------------------------------------------------------------------------------------------------------------  Cardiac Enzymes No results for input(s): TROPONINI in the last 168 hours. ------------------------------------------------------------------------------------------------------------------  RADIOLOGY:  Dg Lumbar Spine 2-3 Views  Result Date: 05/11/2019 CLINICAL DATA:  Unresolved low back pain. Unable to walk or stand. Kyphoplasty 04/22/2019 EXAM: LUMBAR SPINE - 2-3 VIEW COMPARISON:  05/02/2019.  CT 04/24/2019.  MRI 04/19/2019 FINDINGS: Previously augmented fractures at L3 and L4 as seen previously. No evidence of progression at those 2 levels. Chronic anterolisthesis at L3-4 of 7 mm. I think there  is a new fracture at L1 with loss of height of about 10%. No other change. IMPRESSION: Suspicion of a new fracture at L1 with loss of  height of about 10%. Electronically Signed   By: Nelson Chimes M.D.   On: 05/11/2019 07:55   Mr Lumbar Spine Wo Contrast  Result Date: 05/11/2019 CLINICAL DATA:  Recent L3 and L4 kyphoplasties. Continued back pain. Suspected new L1 compression fracture on x-ray. EXAM: MRI LUMBAR SPINE WITHOUT CONTRAST TECHNIQUE: Multiplanar, multisequence MR imaging of the lumbar spine was performed. No intravenous contrast was administered. COMPARISON:  Lumbar spine x-rays from same day. MRI lumbar spine dated April 19, 2019. FINDINGS: Segmentation:  Standard. Alignment: Unchanged 6 mm anterolisthesis at L3-L4 and 5 mm anterolisthesis at L4-L5. Vertebrae: New minimal L1 superior endplate compression fracture with approximately 10% height loss and trace retropulsion. Unchanged mild L3 and L4 compression deformity status post cement augmentation. No evidence of discitis or suspicious bone lesion. Similar degenerative endplate marrow edema at L2-L3. Conus medullaris and cauda equina: Conus extends to the L2 level. Conus and cauda equina appear normal. Paraspinal and other soft tissues: Unchanged bilateral renal cysts. Unchanged 3.9 cm infrarenal abdominal aortic aneurysm. Disc levels: T12-L1: Negative disc. Unchanged mild bilateral facet arthropathy. No stenosis. L1-L2: Negative disc. Unchanged mild bilateral facet arthropathy. No stenosis. L2-L3: Unchanged disc bulging, endplate spurring, and moderate to severe bilateral facet arthropathy. Unchanged moderate spinal canal stenosis. Unchanged severe left and moderate right lateral recess stenosis. Unchanged moderate bilateral neuroforaminal stenosis. L3-L4: Prior bilateral laminectomies. Unchanged disc uncovering without significant bulge or herniation. Unchanged severe bilateral facet arthropathy. Unchanged moderate spinal canal and left greater than right lateral recess stenosis. Unchanged mild right neuroforaminal stenosis. No left neuroforaminal stenosis. L4-L5: Unchanged disc  uncovering with right-sided disc osteophyte complex. Unchanged moderate bilateral facet arthropathy. Unchanged mild right greater than left lateral recess stenosis. No spinal canal or neuroforaminal stenosis. L5-S1: Negative disc. Unchanged mild bilateral facet arthropathy. Unchanged mild right neuroforaminal stenosis due to endplate spurring. No spinal canal or left neuroforaminal stenosis. IMPRESSION: 1. New acute minimal L1 superior endplate compression fracture with approximately 10% height loss and trace retropulsion. 2. Unchanged mild L3 and L4 compression deformity status post kyphoplasty. 3. Similar appearing multilevel spondylosis as described above with moderate stenosis at L2-L3 and L3-L4. Electronically Signed   By: Titus Dubin M.D.   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  Presented back to the emergency room with complaints of worsening back pain.    1.  Back pain secondary to compression fracture. Patient recently discharged from the hospital following kyphoplasty for L4 compression fracture.  Was discharged to skilled nursing facility and subsequently home.  Back with worsening back pains. MRI of the lumbar spine revealed Patient had MRI of the lumbar spine done today which revealed new acute minimal L1 superior endplate compression fracture withapproximately 10% height loss and trace retropulsion. Unchanged mild L3 and L4 compression deformity status post Kyphoplasty.  No urinary incontinence or retention.  No fecal incontinence.  Patient reports  ambulating with assistance at home prior to admission today. Seen by orthopedics and plan  for L1-L2 kyphoplasty tomorrow.  2.  Hypertension; uncontrolled Likely due to pains. Resumed home blood pressure meds.  Placed on PRN IV hydralazine with parameters.  3.  Hypothyroidism. Resume Synthroid.  TSH level in a.m.  4.  History of depression Stable.  Continue Celexa.  5.  History of asthma Stable.    All the records are reviewed and case discussed with Care Management/Social Workerr. Management plans discussed with the patient, family and they are in agreement.  CODE STATUS: Full.  TOTAL TIME TAKING CARE OF THIS PATIENT: 35 minutes.     POSSIBLE D/C IN 1-2 DAYS, DEPENDING ON CLINICAL CONDITION.   Vaughan Basta M.D on 05/12/2019   Between 7am to 6pm - Pager - [phone]  After 6pm go to [email or web address] - password [email or web address] [facility]  Office  [phone]  CC: Primary care physician; Zhou-Talbert, Elwyn Lade, MD  Note: This dictation was prepared with Dragon dictation along with smaller phrase technology. Any transcriptional errors that result from this process are unintentional.

## 2019-05-12 NOTE — Anesthesia Preprocedure Evaluation (Addendum)
Anesthesia Evaluation  Patient identified by MRN, date of birth, ID band Patient awake    Reviewed: Allergy & Precautions, H&P , NPO status , Patient's Chart, lab work & pertinent test results  Airway Mallampati: II  TM Distance: >3 FB Neck ROM: limited    Dental  (+) Missing, Edentulous Upper   Pulmonary shortness of breath and Long-Term Oxygen Therapy, asthma , COPD,  COPD inhaler and oxygen dependent, former smoker,           Cardiovascular hypertension, (-) angina(-) Past MI      Neuro/Psych neg Seizures PSYCHIATRIC DISORDERS Depression negative neurological ROS     GI/Hepatic Neg liver ROS, GERD  Controlled,  Endo/Other  Hypothyroidism   Renal/GU negative Renal ROS  negative genitourinary   Musculoskeletal   Abdominal   Peds  Hematology negative hematology ROS (+)   Anesthesia Other Findings Past Medical History: No date: Asthma 02/2018: Cancer ([place])     Comment:  bone marrow cancer No date: Depression No date: GERD (gastroesophageal reflux disease) No date: Hypertension No date: Hypothyroidism No date: Personal history of chemotherapy No date: Spinal stenosis   Reproductive/Obstetrics negative OB ROS                           Anesthesia Physical Anesthesia Plan  ASA: II  Anesthesia Plan: General   Post-op Pain Management:    Induction:   PONV Risk Score and Plan: Propofol infusion and TIVA  Airway Management Planned: Natural Airway and Simple Face Mask  Additional Equipment:   Intra-op Plan:   Post-operative Plan:   Informed Consent: I have reviewed the patients History and Physical, chart, labs and discussed the procedure including the risks, benefits and alternatives for the proposed anesthesia with the patient or authorized representative who has indicated his/her understanding and acceptance.     Dental Advisory Given  Plan Discussed with: Anesthesiologist  and CRNA  Anesthesia Plan Comments:        Anesthesia Quick Evaluation

## 2019-05-12 NOTE — Progress Notes (Signed)
Patient continues to have severe pain.  Pain is in the mid back referred pain to the lower right area lumbar spine.  Review of MRI I agree with Dr. Posey Pronto that L1 and L2 both have compression fractures.  Comparing to prior MRI from a few weeks ago the edema at L2 is new.  Plan is for L1 and L2 kyphoplasty tomorrow

## 2019-05-12 NOTE — TOC Initial Note (Signed)
Transition of Care [facility]) - Initial/Assessment Note    Patient Details  Name: Veronica Hubbard MRN: [id number] Date of Birth: 1935-06-05  [facility] CM/SW Contact:    Su Hilt, RN Phone Number: 05/12/2019, 11:35 AM  Clinical Narrative:                 Spoke with the patient to discuss DC plan and needs, she asked that I call her sister [name].  The patient was recently in rehab but wishes to return home at DC Her sister has been staying with her and will continue to do so after DC The patient has a WC, BSC, RW, cane and Raised toilet seat at home and has no need for more DME She is currently open with [facility], I notified Tommi Rumps at [facility] that the anticipated DC date is tomorrow I explained to the sister that unless there is a medical reason the hospital stay does not usually go beyond 1 night for Kypho We will wait until after Surgery and see how she is doing, The sister agrees  Expected Discharge Plan: [place] Barriers to Discharge: Continued Medical Work up   Patient Goals and CMS Choice Patient states their goals for this hospitalization and ongoing recovery are:: go home with Home health CMS Medicare.gov Compare Post Acute Care list provided to:: Patient Choice offered to / list presented to : Patient  Expected Discharge Plan and Services Expected Discharge Plan: [place]   Discharge Planning Services: CM Consult Post Acute Care Choice: [place] arrangements for the past 2 months: Single Family Home                 DME Arranged: N/A         HH Arranged: PT HH Agency: [facility] Date [place]: 05/12/19 Time HH Agency Contacted: [id number] Representative spoke with at [place]: Tommi Rumps  Prior Living Arrangements/Services Living arrangements for the past 2 months: [place] with:: Self(Her sisiter Apolonio Schneiders has been staying with her and will continue) Patient  language and need for interpreter reviewed:: No Do you feel safe going back to the place where you live?: Yes      Need for Family Participation in Patient Care: No (Comment) Care giver support system in place?: Yes (comment) Current home services: DME(BSC, RW, Raised toilet, WC, cane) Criminal Activity/Legal Involvement Pertinent to Current Situation/Hospitalization: No - Comment as needed  Activities of Daily Living Home Assistive Devices/Equipment: Environmental consultant (specify type), Wheelchair, Eyeglasses, Bedside commode/3-in-1 ADL Screening (condition at time of admission) Patient's cognitive ability adequate to safely complete daily activities?: Yes Is the patient deaf or have difficulty hearing?: No Does the patient have difficulty seeing, even when wearing glasses/contacts?: No Does the patient have difficulty concentrating, remembering, or making decisions?: No Patient able to express need for assistance with ADLs?: Yes Does the patient have difficulty dressing or bathing?: Yes Independently performs ADLs?: Yes (appropriate for developmental age) Does the patient have difficulty walking or climbing stairs?: Yes Weakness of Legs: Both Weakness of Arms/Hands: None  Permission Sought/Granted Permission sought to share information with : Family Supports Permission granted to share information with : Yes, Verbal Permission Granted        Permission granted to share info w Relationship: [name] sister     Emotional Assessment Appearance:: Appears stated age Attitude/Demeanor/Rapport: Engaged Affect (typically observed): Accepting Orientation: : Oriented to Self, Oriented to Place, Oriented to  Time, Oriented to Situation Alcohol / Substance Use: Not Applicable Psych Involvement: No (comment)  Admission diagnosis:  Intractable back pain [M54.9] Closed compression fracture of body of L1 vertebra ([place]) [S32.010A] Patient Active Problem List   Diagnosis Date Noted  . Compression fracture  of lumbar spine, non-traumatic ([place]) 05/11/2019  . Intractable pain 04/19/2019  . AAA (abdominal aortic aneurysm) without rupture ([place]) 04/09/2017  . Bilateral carotid artery stenosis 04/09/2017  . Thoracic aortic aneurysm without rupture ([place]) 04/09/2017  . Hyponatremia 02/23/2016  . Chest pain 02/23/2016  . Gastroenteritis due to norovirus 02/09/2016   PCP:  Zhou-Talbert, Elwyn Lade, MD Pharmacy:   [facility], [place] [address] [place] [address] Phone: [phone] Fax: [phone]     Social Determinants of Health (SDOH) Interventions    Readmission Risk Interventions No flowsheet data found.

## 2019-05-12 NOTE — Progress Notes (Signed)
Sister of patient, [place] notified of patient's procedure today as requested by patient.

## 2019-05-12 NOTE — NC FL2 (Signed)
[place] LEVEL OF CARE SCREENING TOOL     IDENTIFICATION  Patient Name: Veronica Hubbard Birthdate: 1935-09-02 Sex: female Admission Date (Current Location): 05/11/2019  [place] and [place] Number:  Engineering geologist and Address:  [facility], [address] [place], [place] [address]      Provider Number: [id number]  Attending Physician Name and Address:  Vaughan Basta, *  Relative Name and Phone Number:  [name]    Current Level of Care: Hospital Recommended Level of Care: [place] Prior Approval Number:    Date Approved/Denied: 05/12/19 PASRR Number: [id number]  Discharge Plan: SNF    Current Diagnoses: Patient Active Problem List   Diagnosis Date Noted  . Compression fracture of lumbar spine, non-traumatic ([place]) 05/11/2019  . Intractable pain 04/19/2019  . AAA (abdominal aortic aneurysm) without rupture ([place]) 04/09/2017  . Bilateral carotid artery stenosis 04/09/2017  . Thoracic aortic aneurysm without rupture ([place]) 04/09/2017  . Hyponatremia 02/23/2016  . Chest pain 02/23/2016  . Gastroenteritis due to norovirus 02/09/2016    Orientation RESPIRATION BLADDER Height & Weight     Self, Time, Situation, Place  Normal Continent Weight: 59 kg Height:  5\' 3"  (160 cm)  BEHAVIORAL SYMPTOMS/MOOD NEUROLOGICAL BOWEL NUTRITION STATUS      Continent Diet  AMBULATORY STATUS COMMUNICATION OF NEEDS Skin   Extensive Assist Verbally Surgical wounds                       Personal Care Assistance Level of Assistance  Bathing, Total care, Dressing Bathing Assistance: Limited assistance Feeding assistance: Independent Dressing Assistance: Limited assistance Total Care Assistance: Limited assistance   Functional Limitations Info  Sight, Hearing, Speech Sight Info: Adequate Hearing Info: Adequate Speech Info: Adequate    SPECIAL CARE FACTORS FREQUENCY  PT (By licensed PT)     PT  Frequency: five times per week OT Frequency: 5 times per week            Contractures Contractures Info: Not present    Additional Factors Info  Code Status, Allergies Code Status Info: full Allergies Info: Aspirin, Celecoxib, Iodinated Diagnostic Agents, Losartan, Morphine, Verapamil           Current Medications (05/12/2019):  This is the current hospital active medication list Current Facility-Administered Medications  Medication Dose Route Frequency Provider Last Rate Last Dose  . acetaminophen (TYLENOL) tablet 650 mg  650 mg Oral Q6H PRN Ojie, Jude, MD      . allopurinol (ZYLOPRIM) tablet 200 mg  200 mg Oral Daily Ojie, Jude, MD   200 mg at 05/12/19 0834  . budesonide (PULMICORT) nebulizer solution 0.5 mg  0.5 mg Nebulization BID Stark Jock, Jude, MD   0.5 mg at 05/12/19 0740  . [START ON 05/13/2019] ceFAZolin (ANCEF) IVPB 1 g/50 mL premix  1 g Intravenous Once Hessie Knows, MD      . cholecalciferol (VITAMIN D) tablet 1,000 Units  1,000 Units Oral Daily Stark Jock, Jude, MD   1,000 Units at 05/12/19 0835  . citalopram (CELEXA) tablet 20 mg  20 mg Oral Daily Ojie, Jude, MD   20 mg at 05/12/19 0835  . cyclobenzaprine (FLEXERIL) tablet 5-10 mg  5-10 mg Oral QHS PRN Ojie, Jude, MD      . ezetimibe (ZETIA) tablet 10 mg  10 mg Oral QHS Ojie, Jude, MD   10 mg at 05/11/19 2112  . fluticasone furoate-vilanterol (BREO ELLIPTA) 100-25 MCG/INH 1 puff  1 puff Inhalation Daily  Stark Jock, Jude, MD   1 puff at 05/12/19 0916   And  . umeclidinium bromide (INCRUSE ELLIPTA) 62.5 MCG/INH 1 puff  1 puff Inhalation Daily Ojie, Jude, MD   1 puff at 05/12/19 0916  . hydrALAZINE (APRESOLINE) injection 10 mg  10 mg Intravenous Q6H PRN Ojie, Jude, MD      . hydrALAZINE (APRESOLINE) tablet 50 mg  50 mg Oral Q8H Ojie, Jude, MD   50 mg at 05/11/19 2112  . HYDROmorphone (DILAUDID) injection 0.5 mg  0.5 mg Intravenous Q4H PRN Ojie, Jude, MD      . ipratropium-albuterol (DUONEB) 0.5-2.5 (3) MG/3ML nebulizer solution 3 mL  3 mL  Nebulization QID PRN Ojie, Jude, MD      . isosorbide mononitrate (IMDUR) 24 hr tablet 30 mg  30 mg Oral Daily Ojie, Jude, MD   30 mg at 05/12/19 0836  . levothyroxine (SYNTHROID) tablet 150 mcg  150 mcg Oral Q0600 Ojie, Jude, MD      . Melatonin TABS 5 mg  5 mg Oral QHS Ojie, Jude, MD   5 mg at 05/11/19 2112  . oxyCODONE-acetaminophen (PERCOCET) 7.5-325 MG per tablet 2 tablet  2 tablet Oral Q6H PRN Stark Jock, Jude, MD   2 tablet at 05/12/19 1254  . pantoprazole (PROTONIX) EC tablet 40 mg  40 mg Oral BID Stark Jock, Jude, MD   40 mg at 05/12/19 0836  . polyethylene glycol (MIRALAX / GLYCOLAX) packet 17 g  17 g Oral Daily PRN Ojie, Jude, MD      . triamcinolone cream (KENALOG) 0.5 % 1 application  1 application Topical TID Stark Jock, Jude, MD   1 application at 50/03/70 1254     Discharge Medications: Please see discharge summary for a list of discharge medications.  Relevant Imaging Results:  Relevant Lab Results:   Additional Information [id number]  Su Hilt, RN

## 2019-05-13 ENCOUNTER — Inpatient Hospital Stay: Payer: Medicare HMO | Admitting: Anesthesiology

## 2019-05-13 ENCOUNTER — Inpatient Hospital Stay: Payer: Medicare HMO

## 2019-05-13 ENCOUNTER — Encounter
Admission: EM | Disposition: A | Discharge status: Home Health | Payer: Self-pay | Source: Home / Self Care | Attending: Internal Medicine

## 2019-05-13 ENCOUNTER — Encounter: Payer: Self-pay | Admitting: *Deleted

## 2019-05-13 HISTORY — PX: KYPHOPLASTY: SHX5884

## 2019-05-13 LAB — URINE CULTURE: Culture: >=100000 — AB

## 2019-05-13 LAB — MRSA PCR SCREENING: MRSA by PCR: POSITIVE — AB

## 2019-05-13 SURGERY — KYPHOPLASTY
Anesthesia: General

## 2019-05-13 MED ORDER — FENTANYL CITRATE (PF) 100 MCG/2ML IJ SOLN
INTRAMUSCULAR | Status: AC
  Administered 2019-05-13: 25 ug via INTRAVENOUS
  Filled 2019-05-13: qty 2

## 2019-05-13 MED ORDER — FENTANYL CITRATE (PF) 100 MCG/2ML IJ SOLN
25.0000 ug | INTRAMUSCULAR | Status: DC | PRN
  Administered 2019-05-13 (×5): 25 ug via INTRAVENOUS

## 2019-05-13 MED ORDER — PROPOFOL 500 MG/50ML IV EMUL
INTRAVENOUS | Status: DC | PRN
  Administered 2019-05-13: 35 ug/kg/min via INTRAVENOUS

## 2019-05-13 MED ORDER — SODIUM CHLORIDE 0.9 % IV SOLN: INTRAVENOUS | Status: DC | PRN

## 2019-05-13 MED ORDER — SODIUM CHLORIDE 0.9 % IV SOLN
1.0000 g | Freq: Once | INTRAVENOUS | Status: AC
  Administered 2019-05-13: 1 g via INTRAVENOUS
  Filled 2019-05-13: qty 1000

## 2019-05-13 MED ORDER — AMOXICILLIN 500 MG PO CAPS
500.0000 mg | ORAL_CAPSULE | Freq: Three times a day (TID) | ORAL | Status: DC
  Administered 2019-05-13 – 2019-05-17 (×12): 500 mg via ORAL
  Filled 2019-05-13 (×14): qty 1

## 2019-05-13 MED ORDER — SENNOSIDES-DOCUSATE SODIUM 8.6-50 MG PO TABS
1.0000 | ORAL_TABLET | Freq: Two times a day (BID) | ORAL | Status: DC
  Administered 2019-05-13 – 2019-05-19 (×12): 1 via ORAL
  Filled 2019-05-13 (×13): qty 1

## 2019-05-13 MED ORDER — BUPIVACAINE-EPINEPHRINE (PF) 0.5% -1:200000 IJ SOLN
INTRAMUSCULAR | Status: DC | PRN
  Administered 2019-05-13: 20 mL

## 2019-05-13 MED ORDER — MIDAZOLAM HCL 2 MG/2ML IJ SOLN
INTRAMUSCULAR | Status: AC
  Filled 2019-05-13: qty 2

## 2019-05-13 MED ORDER — SODIUM CHLORIDE 0.9 % IV SOLN
1.0000 g | INTRAVENOUS | Status: DC
  Filled 2019-05-13: qty 10

## 2019-05-13 MED ORDER — KETAMINE HCL 50 MG/ML IJ SOLN
INTRAMUSCULAR | Status: DC | PRN
  Administered 2019-05-13 (×2): 25 mg via INTRAMUSCULAR

## 2019-05-13 MED ORDER — KETAMINE HCL 50 MG/ML IJ SOLN
INTRAMUSCULAR | Status: AC
  Filled 2019-05-13: qty 10

## 2019-05-13 MED ORDER — MIDAZOLAM HCL 2 MG/2ML IJ SOLN
INTRAMUSCULAR | Status: DC | PRN
  Administered 2019-05-13: 1 mg via INTRAVENOUS

## 2019-05-13 MED ORDER — ENOXAPARIN SODIUM 40 MG/0.4ML ~~LOC~~ SOLN
40.0000 mg | SUBCUTANEOUS | Status: DC
  Administered 2019-05-14 – 2019-05-16 (×3): 40 mg via SUBCUTANEOUS
  Filled 2019-05-13 (×3): qty 0.4

## 2019-05-13 MED ORDER — LIDOCAINE HCL 1 % IJ SOLN
INTRAMUSCULAR | Status: DC | PRN
  Administered 2019-05-13: 30 mL

## 2019-05-13 MED ORDER — LACTATED RINGERS IV SOLN
INTRAVENOUS | Status: DC | PRN
  Administered 2019-05-13: 13:00:00 via INTRAVENOUS

## 2019-05-13 SURGICAL SUPPLY — 20 items
CEMENT KYPHON CX01A KIT/MIXER (Cement) ×3 IMPLANT
COVER WAND RF STERILE (DRAPES) ×3 IMPLANT
DERMABOND ADVANCED (GAUZE/BANDAGES/DRESSINGS) ×2
DERMABOND ADVANCED .7 DNX12 (GAUZE/BANDAGES/DRESSINGS) ×1 IMPLANT
DEVICE BIOPSY BONE KYPHX (INSTRUMENTS) ×3 IMPLANT
DRAPE C-ARM XRAY 36X54 (DRAPES) ×3 IMPLANT
DURAPREP 26ML APPLICATOR (WOUND CARE) ×3 IMPLANT
FEE RENTAL RFA GENERATOR (MISCELLANEOUS) IMPLANT
GLOVE SURG SYN 9.0  PF PI (GLOVE) ×2
GLOVE SURG SYN 9.0 PF PI (GLOVE) ×1 IMPLANT
GOWN SRG 2XL LVL 4 RGLN SLV (GOWNS) ×1 IMPLANT
GOWN STRL NON-REIN 2XL LVL4 (GOWNS) ×2
GOWN STRL REUS W/ TWL LRG LVL3 (GOWN DISPOSABLE) ×1 IMPLANT
GOWN STRL REUS W/TWL LRG LVL3 (GOWN DISPOSABLE) ×2
PACK KYPHOPLASTY (MISCELLANEOUS) ×3 IMPLANT
RENTAL RFA  GENERATOR (MISCELLANEOUS)
RENTAL RFA GENERATOR (MISCELLANEOUS) IMPLANT
STRAP SAFETY 5IN WIDE (MISCELLANEOUS) ×3 IMPLANT
SYSTEM GUN BONE FILLER SZ2 (MISCELLANEOUS) ×2 IMPLANT
TRAY KYPHOPAK 15/3 EXPRESS 1ST (MISCELLANEOUS) ×2 IMPLANT

## 2019-05-13 NOTE — Progress Notes (Addendum)
[place] NAME: Veronica Hubbard    MR#:  [id number]  DATE OF BIRTH:  [birth year]  SUBJECTIVE:  CHIEF COMPLAINT:   Chief Complaint  Patient presents with  . Back Pain   Recent surgery on lumbar spinal fractures with vertebral fusion.  Came again with significant back pain and noted to have L1 and L2 compression fractures. Significant pain today. Plan for kyphoplasty today.  REVIEW OF SYSTEMS:  CONSTITUTIONAL: No fever, fatigue or weakness.  EYES: No blurred or double vision.  EARS, NOSE, AND THROAT: No tinnitus or ear pain.  RESPIRATORY: No cough, shortness of breath, wheezing or hemoptysis.  CARDIOVASCULAR: No chest pain, orthopnea, edema.  GASTROINTESTINAL: No nausea, vomiting, diarrhea or abdominal pain.  GENITOURINARY: No dysuria, hematuria.  ENDOCRINE: No polyuria, nocturia,  HEMATOLOGY: No anemia, easy bruising or bleeding SKIN: No rash or lesion. MUSCULOSKELETAL: No joint pain or arthritis.   NEUROLOGIC: No tingling, numbness, weakness.  PSYCHIATRY: No anxiety or depression.   ROS  DRUG ALLERGIES:   Allergies  Allergen Reactions  . Aspirin Shortness Of Breath  . Celecoxib Shortness Of Breath  . Iodinated Diagnostic Agents Other (See Comments)    Reaction:  Decreased kidney function  . Losartan Itching  . Morphine Nausea And Vomiting  . Verapamil Itching    VITALS:  Blood pressure (!) 166/53, pulse 70, temperature 98.2 F (36.8 C), temperature source Oral, resp. rate 19, height 5\' 3"  (1.6 m), weight 59 kg, SpO2 94 %.  PHYSICAL EXAMINATION:  GENERAL:  84 y.o.-year-old patient lying in the bed with  acute distress- due to pain in back .  EYES: Pupils equal, round, reactive to light and accommodation. No scleral icterus. Extraocular muscles intact.  HEENT: Head atraumatic, normocephalic. Oropharynx and nasopharynx clear.  NECK:  Supple, no jugular venous distention. No thyroid enlargement, no tenderness.   LUNGS: Normal breath sounds bilaterally, no wheezing, rales,rhonchi or crepitation. No use of accessory muscles of respiration.  CARDIOVASCULAR: S1, S2 normal. No murmurs, rubs, or gallops.  ABDOMEN: Soft, nontender, nondistended. Bowel sounds present. No organomegaly or mass.  Tender in the vertebral area around lumbar region. EXTREMITIES: No pedal edema, cyanosis, or clubbing.  NEUROLOGIC: Cranial nerves II through XII are intact. Muscle strength 4/5 in all extremities. Sensation intact. Gait not checked.  PSYCHIATRIC: The patient is alert and oriented x 2.  SKIN: No obvious rash, lesion, or ulcer.   Physical Exam LABORATORY PANEL:   CBC Recent Labs  Lab 05/12/19 0458  WBC 22.2*  HGB 12.9  HCT 41.1  PLT 282   ------------------------------------------------------------------------------------------------------------------  Chemistries  Recent Labs  Lab 05/11/19 0654 05/12/19 0458  NA 137 138  K 3.6 4.0  CL 103 102  CO2 27 30  GLUCOSE 96 87  BUN 24* 21  CREATININE 1.21* 1.14*  CALCIUM 10.6* 10.6*  MG  --  2.1  AST 17  --   ALT 6  --   ALKPHOS 75  --   BILITOT 0.6  --    ------------------------------------------------------------------------------------------------------------------  Cardiac Enzymes No results for input(s): TROPONINI in the last 168 hours. ------------------------------------------------------------------------------------------------------------------  RADIOLOGY:  Dg Lumbar Spine 2-3 Views  Result Date: 05/13/2019 CLINICAL DATA:  L1 and L2 kyphoplasty. EXAM: LUMBAR SPINE - 2-3 VIEW COMPARISON:  05/11/2019 FINDINGS: C-arm images show vertebral augmentation at the L1 and L2 levels. No complicating features on these limited images. IMPRESSION: L1 and L2 vertebral augmentation. Electronically Signed   By: Jan Fireman.D.  On: 05/13/2019 14:14   Dg Abd 1 View  Result Date: 05/13/2019 CLINICAL DATA:  Abdominal pain EXAM: ABDOMEN - 1 VIEW  COMPARISON:  04/26/2019 FINDINGS: There is a moderate amount of stool throughout the colon. There is no bowel dilatation to suggest obstruction. There is no evidence of pneumoperitoneum, portal venous gas or pneumatosis. There are no pathologic calcifications along the expected course of the ureters. There is a right lower pole renal calculus. There is prior vertebral body augmentation at L3 and L4. Moderate-severe osteoarthritis of bilateral hips. IMPRESSION: No evidence of bowel obstruction. Moderate amount of stool throughout the colon. Electronically Signed   By: Kathreen Devoid   On: 05/13/2019 11:02   Dg C-arm 1-60 Min  Result Date: 05/13/2019 CLINICAL DATA:  L1 and L2 kyphoplasty. EXAM: LUMBAR SPINE - 2-3 VIEW COMPARISON:  05/11/2019 FINDINGS: C-arm images show vertebral augmentation at the L1 and L2 levels. No complicating features on these limited images. IMPRESSION: L1 and L2 vertebral augmentation. Electronically Signed   By: Nelson Chimes M.D.   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  Presented back to the emergency room with complaints of worsening back pain.    1.  Back pain secondary to compression fracture. Patient recently discharged from the hospital following kyphoplasty for L4 compression fracture.  Was discharged to skilled nursing facility and subsequently home.  Back with worsening back pains. MRI of the lumbar spine revealed Patient had MRI of the lumbar spine done today which revealed new acute minimal L1 superior endplate compression fracture  withapproximately 10% height loss and trace retropulsion. Unchanged mild L3 and L4 compression deformity status post Kyphoplasty.  No urinary incontinence or retention.  No fecal incontinence.  Patient reports ambulating with assistance at home prior to admission today. Seen by orthopedics and plan for L1-L2 kyphoplasty 05/13/19.  2.  Hypertension; uncontrolled Likely due to pains. Resumed home blood pressure meds.  Placed on PRN IV hydralazine with parameters.  3.  Hypothyroidism. Resume Synthroid.  TSH level in a.m.  4.  History of depression Stable.  Continue Celexa.  5.  History of asthma Stable.  6.  Abdominal pain complaint-x-ray KUB to rule out obstruction. Give stool softeners.  7. UTI- cx shows E fecalis   Amoxicilline  All the records are reviewed and case discussed with Care Management/Social Workerr. Management plans discussed with the patient, family and they are in agreement.  CODE STATUS: Full.  TOTAL TIME TAKING CARE OF THIS PATIENT: 35 minutes.   I spoke to patient's sister on the phone.  POSSIBLE D/C IN 1-2 DAYS, DEPENDING ON CLINICAL CONDITION.   Vaughan Basta M.D on 05/13/2019   Between 7am to 6pm - Pager - [phone]  After 6pm go to [email or web address] - password [email or web address] [facility]  Office  [phone]  CC: Primary care physician; Zhou-Talbert, Elwyn Lade, MD  Note: This dictation was prepared with Dragon dictation along with smaller phrase technology. Any transcriptional errors that result from this process are unintentional.

## 2019-05-13 NOTE — Transfer of Care (Signed)
Immediate Anesthesia Transfer of Care Note  Patient: Desia Saban  Procedure(s) Performed: KYPHOPLASTY L1 L2 (N/A )  Patient Location: PACU  Anesthesia Type:General  Level of Consciousness: awake and sedated  Airway & Oxygen Therapy: Patient Spontanous Breathing and Patient connected to nasal cannula oxygen  Post-op Assessment: Report given to RN and Post -op Vital signs reviewed and stable  Post vital signs: Reviewed and stable  Last Vitals:  Vitals Value Taken Time  BP    Temp    Pulse 79 05/13/19 1412  Resp    SpO2 99 % 05/13/19 1412  Vitals shown include unvalidated device data.  Last Pain:  Vitals:   05/13/19 1247  TempSrc: Tympanic  PainSc: 6       Patients Stated Pain Goal: 2 (60/63/01 6010)  Complications: No apparent anesthesia complications

## 2019-05-13 NOTE — Op Note (Signed)
Date May 13, 2019  time 2:15 PM   PATIENT:  Veronica Hubbard   PRE-OPERATIVE DIAGNOSIS:  closed wedge compression fracture of L1 and L2   POST-OPERATIVE DIAGNOSIS:  closed wedge compression fracture of L1 and L2   PROCEDURE:  Procedure(s): KYPHOPLASTY L1 and L2  SURGEON: Laurene Footman, MD   ASSISTANTS: None   ANESTHESIA:   local and MAC   EBL:  No intake/output data recorded.   BLOOD ADMINISTERED:none   DRAINS: none    LOCAL MEDICATIONS USED:  MARCAINE    and XYLOCAINE    SPECIMEN:   None   DISPOSITION OF SPECIMEN:  Not applicable   COUNTS:  YES   TOURNIQUET:  * No tourniquets in log *   IMPLANTS: Bone cement   DICTATION: .Dragon Dictation  patient was brought to the operating room and after adequate anesthesia was obtained the patient was placed prone.  C arm was brought in in good visualization of the affected level obtained on both AP and lateral projections.  After patient identification and timeout procedures were completed, local anesthetic was infiltrated with 10 cc 1% Xylocaine infiltrated subcutaneously.  This is done the area on the right side of the planned approach.  The back was then prepped and draped in the usual sterile manner and repeat timeout procedure carried out.  A spinal needle was brought down to the pedicle on the right side of  L1 and L2 and a 50-50 mix of 1% Xylocaine half percent Sensorcaine with epinephrine total of 20 cc injected.  After allowing this to set a small incision was made and the trocar was advanced into the vertebral body in an extrapedicular fashion.  Biopsy was not obtained at either level Drilling was carried out balloon inserted with inflation to  2-1/2 cc.  When the cement was appropriate consistency 4-1/2 cc was injected into each vertebral body without extravasation, good fill superior to inferior endplates and from right to left sides along the inferior endplate.  After the cement had set the trochar was removed and permanent  C-arm views obtained.  The wound was closed with Dermabond followed by Band-Aid   PLAN OF CARE:  Continue as inpatient   PATIENT DISPOSITION:  PACU - hemodynamically stable.

## 2019-05-13 NOTE — Anesthesia Postprocedure Evaluation (Signed)
Anesthesia Post Note  Patient: Veronica Hubbard  Procedure(s) Performed: KYPHOPLASTY L1 L2 (N/A )  Patient location during evaluation: PACU Anesthesia Type: General Level of consciousness: awake and alert Pain management: pain level controlled Vital Signs Assessment: post-procedure vital signs reviewed and stable Respiratory status: spontaneous breathing, nonlabored ventilation and respiratory function stable Cardiovascular status: blood pressure returned to baseline and stable Postop Assessment: no apparent nausea or vomiting Anesthetic complications: no     Last Vitals:  Vitals:   05/13/19 1500 05/13/19 1510  BP:    Pulse:  73  Resp:  19  Temp: 36.6 C   SpO2:  94%    Last Pain:  Vitals:   05/13/19 1500  TempSrc:   PainSc: [name]

## 2019-05-13 NOTE — Progress Notes (Signed)
L1 and L2 kyphoplasty planned for later this morning

## 2019-05-13 NOTE — TOC Progression Note (Signed)
Transition of Care [facility]) - Progression Note    Patient Details  Name: Veronica Hubbard MRN: [id number] Date of Birth: Mar 10, 1935  Transition of Care [facility]) CM/SW [place], RN Phone Number: 05/13/2019, 11:20 AM  Clinical Narrative:    Spoke to the patient's sister [name], I explained that [facility] does not have a bed to offer, I reviewed the other bed offers in detail and [name] stated that they did not want any place but [facility],  I explained that I can't get that bed offer.  She stated that the patient will just come home with her and [facility] services already set up with Alvis Lemmings, I notified the Doctor that we will need [place] orders at DC, he stated understanding   Expected Discharge Plan: [place] Barriers to Discharge: Continued Medical Work up  Expected Discharge Plan and Services Expected Discharge Plan: [place]   Discharge Planning Services: CM Consult Post Acute Care Choice: [place] arrangements for the past 2 months: Single Family Home                 DME Arranged: N/A         HH Arranged: PT HH Agency: [place] Date [place]: 05/12/19 Time [place]: 1133 Representative spoke with at [place]: [place] ([place]) Interventions    Readmission Risk Interventions No flowsheet data found.

## 2019-05-13 NOTE — Anesthesia Post-op Follow-up Note (Signed)
Anesthesia QCDR form completed.        

## 2019-05-14 ENCOUNTER — Inpatient Hospital Stay: Payer: Medicare HMO

## 2019-05-14 ENCOUNTER — Encounter: Payer: Self-pay | Admitting: Orthopedic Surgery

## 2019-05-14 LAB — BASIC METABOLIC PANEL
Anion gap: 8 (ref 5–15)
BUN: 16 mg/dL (ref 8–23)
CO2: 24 mmol/L (ref 22–32)
Calcium: 10.2 mg/dL (ref 8.9–10.3)
Chloride: 104 mmol/L (ref 98–111)
Creatinine, Ser: 1.01 mg/dL — ABNORMAL HIGH (ref 0.44–1.00)
GFR calc Af Amer: >60 mL/min (ref >60)
GFR calc non Af Amer: 52 mL/min — ABNORMAL LOW (ref >60)
Glucose, Bld: 116 mg/dL — ABNORMAL HIGH (ref 70–99)
Potassium: 3.3 mmol/L — ABNORMAL LOW (ref 3.5–5.1)
Sodium: 136 mmol/L (ref 135–145)

## 2019-05-14 LAB — CBC
HCT: 39.3 % (ref 36.0–46.0)
Hemoglobin: 12.5 g/dL (ref 12.0–15.0)
MCH: 27.6 pg (ref 26.0–34.0)
MCHC: 31.8 g/dL (ref 30.0–36.0)
MCV: 86.8 fL (ref 80.0–100.0)
Platelets: 278 10*3/uL (ref 150–400)
RBC: 4.53 MIL/uL (ref 3.87–5.11)
RDW: 21.1 % — ABNORMAL HIGH (ref 11.5–15.5)
WBC: 22.7 10*3/uL — ABNORMAL HIGH (ref 4.0–10.5)
nRBC: 0.2 % (ref 0.0–0.2)

## 2019-05-14 MED ORDER — METHYLNALTREXONE BROMIDE 12 MG/0.6ML ~~LOC~~ SOLN
12.0000 mg | Freq: Once | SUBCUTANEOUS | Status: AC
  Administered 2019-05-14: 16:00:00 12 mg via SUBCUTANEOUS
  Filled 2019-05-14 (×2): qty 0.6

## 2019-05-14 MED ORDER — BISACODYL 10 MG RE SUPP
10.0000 mg | Freq: Every day | RECTAL | Status: DC
  Administered 2019-05-15 – 2019-05-19 (×3): 10 mg via RECTAL
  Filled 2019-05-14 (×4): qty 1

## 2019-05-14 MED ORDER — OXYCODONE-ACETAMINOPHEN 7.5-325 MG PO TABS
1.0000 | ORAL_TABLET | Freq: Four times a day (QID) | ORAL | Status: DC | PRN
  Administered 2019-05-16 (×2): 1 via ORAL
  Administered 2019-05-17: 2 via ORAL
  Administered 2019-05-18: 1 via ORAL
  Administered 2019-05-18: 2 via ORAL
  Administered 2019-05-19 (×2): 1 via ORAL
  Filled 2019-05-14 (×2): qty 1
  Filled 2019-05-14: qty 2
  Filled 2019-05-14 (×5): qty 1

## 2019-05-14 MED ORDER — POTASSIUM CHLORIDE CRYS ER 20 MEQ PO TBCR
20.0000 meq | EXTENDED_RELEASE_TABLET | Freq: Two times a day (BID) | ORAL | Status: DC
  Administered 2019-05-14 – 2019-05-19 (×10): 20 meq via ORAL
  Filled 2019-05-14 (×11): qty 1

## 2019-05-14 MED ORDER — METOPROLOL TARTRATE 25 MG PO TABS
25.0000 mg | ORAL_TABLET | Freq: Two times a day (BID) | ORAL | Status: DC
  Administered 2019-05-14 – 2019-05-19 (×9): 25 mg via ORAL
  Filled 2019-05-14 (×11): qty 1

## 2019-05-14 MED ORDER — BISACODYL 10 MG RE SUPP
10.0000 mg | Freq: Once | RECTAL | Status: AC
  Administered 2019-05-14: 10 mg via RECTAL
  Filled 2019-05-14: qty 1

## 2019-05-14 MED ORDER — ONDANSETRON HCL 4 MG/2ML IJ SOLN
4.0000 mg | Freq: Four times a day (QID) | INTRAMUSCULAR | Status: DC | PRN
  Administered 2019-05-14 – 2019-05-17 (×4): 4 mg via INTRAVENOUS
  Filled 2019-05-14 (×4): qty 2

## 2019-05-14 NOTE — TOC Progression Note (Signed)
Transition of Care [facility]) - Progression Note    Patient Details  Name: Veronica Hubbard MRN: [id number] Date of Birth: 1935-08-19  Transition [facility] Care [facility]) CM/SW Contact  Su Hilt, RN Phone Number: 05/14/2019, 11:08 AM  Clinical Narrative:     Spoke to the sister Apolonio Schneiders, she stated that she does want to have [facility] I explained that we would call once we know a date of DC. She stated understanding Notified Magda Paganini at [facility] that the patient was accepting the bed offer Expected Discharge Plan: [place] Barriers to Discharge: Continued Medical Work up  Expected Discharge Plan and Services Expected Discharge Plan: [place]   Discharge Planning Services: CM Consult Post Acute Care Choice: [place] arrangements for the past 2 months: Single Family Home                 DME Arranged: N/A         HH Arranged: PT HH Agency: [place] Date [place]: 05/12/19 Time [place]: 1133 Representative spoke with at [place]: [place] ([place]) Interventions    Readmission Risk Interventions No flowsheet data found.

## 2019-05-14 NOTE — Progress Notes (Signed)
   Subjective: 1 Day Post-Op Procedure(s) (LRB): KYPHOPLASTY L1 L2 (N/A) Patient reports pain as mild.  Back pain 75% better after surgery yesterday. Patient complaining of mild nausea with abdominal pain.  Not sure when her last bowel movement was. Denies any CP, SOB. We will start physical therapy today.   Objective: Vital signs in last 24 hours: Temp:  [97.1 F (36.2 C)-98.5 F (36.9 C)] 97.5 F (36.4 C) (07/08 0829) Pulse Rate:  [69-91] 72 (07/08 0829) Resp:  [11-25] 19 (07/08 0003) BP: (152-197)/(53-86) 181/63 (07/08 0829) SpO2:  [93 %-100 %] 98 % (07/08 0829) Weight:  [59 kg] 59 kg (07/07 1247)  Intake/Output from previous day: 07/07 0701 - 07/08 0700 In: 610 [P.O.:10; I.V.:600] Out: 1260 [Urine:1260] Intake/Output this shift: No intake/output data recorded.  Recent Labs    05/12/19 0458 05/14/19 0413  HGB 12.9 12.5   Recent Labs    05/12/19 0458 05/14/19 0413  WBC 22.2* 22.7*  RBC 4.61 4.53  HCT 41.1 39.3  PLT 282 278   Recent Labs    05/12/19 0458 05/14/19 0413  NA 138 136  K 4.0 3.3*  CL 102 104  CO2 30 24  BUN 21 16  CREATININE 1.14* 1.01*  GLUCOSE 87 116*  CALCIUM 10.6* 10.2   No results for input(s): LABPT, INR in the last 72 hours.  EXAM General - Patient is Alert, Appropriate and Oriented Lumbar spine: Lumbar spine shows no tenderness along spinous process.  No paravertebral muscle tenderness.  Dressing intact.  Sensation intact in lower extremities.  Past Medical History:  Diagnosis Date  . Asthma   . Cancer ([place]) 02/2018   bone marrow cancer  . Depression   . GERD (gastroesophageal reflux disease)   . Hypertension   . Hypothyroidism   . Personal history of chemotherapy   . Spinal stenosis     Assessment/Plan:   1 Day Post-Op Procedure(s) (LRB): KYPHOPLASTY L1 L2 (N/A) Active Problems:   Compression fracture of lumbar spine, non-traumatic (HCC)  Estimated body mass index is 23.03 kg/m as calculated from the following:  Height as of this encounter: 5\' 3"  (1.6 m).   Weight as of this encounter: 59 kg. Advance diet Up with therapy, weightbearing as tolerated. Pain seems to be improved from L1-L2 kyphoplasty 05/13/2019.  We will check patient's progress with PT today. Patient having some mild abdominal pain with nausea.  Uncertain when her last BM was.  Will add suppositories of bowel regimen.   Patient will need to follow-up with [facility] orthopedics in 2 weeks for x-rays of the lumbar spine. Upon discharge patient can remove Band-Aid and begin showering.   Ronney Asters, PA-C [place] 05/14/2019, 11:49 AM

## 2019-05-14 NOTE — TOC Progression Note (Signed)
Transition of Care [facility]) - Progression Note    Patient Details  Name: Veronica Hubbard MRN: [id number] Date of Birth: 1935-01-28  Transition of Care [facility]) CM/SW Contact  Su Hilt, RN Phone Number: 05/14/2019, 2:02 PM  Clinical Narrative:     Faxed clinical  Information to [phone] to [facility] requesting auth  Expected Discharge Plan: [place] Barriers to Discharge: Continued Medical Work up  Expected Discharge Plan and Services Expected Discharge Plan: [place]   Discharge Planning Services: CM Consult Post Acute Care Choice: [place] arrangements for the past 2 months: Single Family Home                 DME Arranged: N/A         HH Arranged: PT HH Agency: [place] Date [place]: 05/12/19 Time [place]: 3300 Representative spoke with at [place]: [place] ([place]) Interventions    Readmission Risk Interventions No flowsheet data found.

## 2019-05-14 NOTE — TOC Progression Note (Signed)
Transition of Care [facility]) - Progression Note    Patient Details  Name: Veronica Hubbard MRN: [id number] Date of Birth: [birth year]  Transition of Care [facility]) CM/SW [place], RN Phone Number: 05/14/2019, 11:28 AM  Clinical Narrative:    Damaris Schooner to [facility] for [facility], requested to start authorization for insurance approval, to fax clinical information to [employer] at [phone] Waiting on PT evaluation notes to send for insurance review   Expected Discharge Plan: [place] Barriers to Discharge: Continued Medical Work up  Expected Discharge Plan and Services Expected Discharge Plan: [place]   Discharge Planning Services: CM Consult Post Acute Care Choice: [place] arrangements for the past 2 months: Single Family Home                 DME Arranged: N/A         HH Arranged: PT HH Agency: [place] Date [place]: 05/12/19 Time [place]: 1133 Representative spoke with at [place]: [place] ([place]) Interventions    Readmission Risk Interventions No flowsheet data found.

## 2019-05-14 NOTE — Progress Notes (Signed)
[facility] NAME: Veronica Hubbard    MR#:  [id number]  DATE OF BIRTH:  1934/12/20  SUBJECTIVE:  CHIEF COMPLAINT:   Chief Complaint  Patient presents with  . Back Pain   Recent surgery on lumbar spinal fractures with vertebral fusion.  Came again with significant back pain and noted to have L1 and L2 compression fractures. Status post kyphoplasty 05/13/19. She had nausea and abdominal pain today.  No bowel movement in the last 2 to 3 days.  REVIEW OF SYSTEMS:  CONSTITUTIONAL: No fever, fatigue or weakness.  EYES: No blurred or double vision.  EARS, NOSE, AND THROAT: No tinnitus or ear pain.  RESPIRATORY: No cough, shortness of breath, wheezing or hemoptysis.  CARDIOVASCULAR: No chest pain, orthopnea, edema.  GASTROINTESTINAL: Have nausea, vomiting, no diarrhea, have abdominal pain.  GENITOURINARY: No dysuria, hematuria.  ENDOCRINE: No polyuria, nocturia,  HEMATOLOGY: No anemia, easy bruising or bleeding SKIN: No rash or lesion. MUSCULOSKELETAL: No joint pain or arthritis.   NEUROLOGIC: No tingling, numbness, weakness.  PSYCHIATRY: No anxiety or depression.   ROS  DRUG ALLERGIES:   Allergies  Allergen Reactions  . Aspirin Shortness Of Breath  . Celecoxib Shortness Of Breath  . Iodinated Diagnostic Agents Other (See Comments)    Reaction:  Decreased kidney function  . Losartan Itching  . Morphine Nausea And Vomiting  . Verapamil Itching    VITALS:  Blood pressure (!) 182/77, pulse 71, temperature 98.4 F (36.9 C), temperature source Oral, resp. rate 20, height 5\' 3"  (1.6 m), weight 59 kg, SpO2 97 %.  PHYSICAL EXAMINATION:  GENERAL:  84 y.o.-year-old patient lying in the bed with  acute distress- due to pain in back .  EYES: Pupils equal, round, reactive to light and accommodation. No scleral icterus. Extraocular muscles intact.  HEENT: Head atraumatic, normocephalic. Oropharynx and nasopharynx clear.  NECK:  Supple,  no jugular venous distention. No thyroid enlargement, no tenderness.  LUNGS: Normal breath sounds bilaterally, no wheezing, rales,rhonchi or crepitation. No use of accessory muscles of respiration.  CARDIOVASCULAR: S1, S2 normal. No murmurs, rubs, or gallops.  ABDOMEN: Soft, tender, nondistended. Bowel sounds present. No organomegaly or mass.  Tender in the vertebral area around lumbar region. EXTREMITIES: No pedal edema, cyanosis, or clubbing.  NEUROLOGIC: Cranial nerves II through XII are intact. Muscle strength 4/5 in all extremities. Sensation intact. Gait not checked.  PSYCHIATRIC: The patient is alert and oriented x 2.  SKIN: No obvious rash, lesion, or ulcer.   Physical Exam LABORATORY PANEL:   CBC Recent Labs  Lab 05/14/19 0413  WBC 22.7*  HGB 12.5  HCT 39.3  PLT 278   ------------------------------------------------------------------------------------------------------------------  Chemistries  Recent Labs  Lab 05/11/19 0654 05/12/19 0458 05/14/19 0413  NA 137 138 136  K 3.6 4.0 3.3*  CL 103 102 104  CO2 27 30 24   GLUCOSE 96 87 116*  BUN 24* 21 16  CREATININE 1.21* 1.14* 1.01*  CALCIUM 10.6* 10.6* 10.2  MG  --  2.1  --   AST 17  --   --   ALT 6  --   --   ALKPHOS 75  --   --   BILITOT 0.6  --   --    ------------------------------------------------------------------------------------------------------------------  Cardiac Enzymes No results for input(s): TROPONINI in the last 168 hours. ------------------------------------------------------------------------------------------------------------------  RADIOLOGY:  Dg Lumbar Spine 2-3 Views  Result Date: 05/13/2019 CLINICAL DATA:  L1 and L2 kyphoplasty. EXAM: LUMBAR SPINE -  2-3 VIEW COMPARISON:  05/11/2019 FINDINGS: C-arm images show vertebral augmentation at the L1 and L2 levels. No complicating features on these limited images. IMPRESSION: L1 and L2 vertebral augmentation. Electronically Signed   By: Nelson Chimes M.D.   On: 05/13/2019 14:14   Dg Abd 1 View  Result Date: 05/14/2019 CLINICAL DATA:  84 year old female with nausea and vomiting. EXAM: ABDOMEN - 1 VIEW COMPARISON:  Abdominal radiograph dated 05/13/2019 FINDINGS: There is moderate amount of stool throughout the colon. No bowel dilatation or evidence of obstruction. No free air. Right renal inferior pole calculus again noted. There is osteopenia with multilevel vertebroplasty changes. Interval vertebroplasty of L1 and L2 since the prior radiograph. Bilateral hip osteoarthritic changes. No acute osseous pathology. Atherosclerotic calcification of the aorta. IMPRESSION: 1. Constipation. No bowel obstruction. 2. Right renal calculus. Electronically Signed   By: Anner Crete M.D.   On: 05/14/2019 12:42   Dg Abd 1 View  Result Date: 05/13/2019 CLINICAL DATA:  Abdominal pain EXAM: ABDOMEN - 1 VIEW COMPARISON:  04/26/2019 FINDINGS: There is a moderate amount of stool throughout the colon. There is no bowel dilatation to suggest obstruction. There is no evidence of pneumoperitoneum, portal venous gas or pneumatosis. There are no pathologic calcifications along the expected course of the ureters. There is a right lower pole renal calculus. There is prior vertebral body augmentation at L3 and L4. Moderate-severe osteoarthritis of bilateral hips. IMPRESSION: No evidence of bowel obstruction. Moderate amount of stool throughout the colon. Electronically Signed   By: Kathreen Devoid   On: 05/13/2019 11:02   Dg C-arm 1-60 Min  Result Date: 05/13/2019 CLINICAL DATA:  L1 and L2 kyphoplasty. EXAM: LUMBAR SPINE - 2-3 VIEW COMPARISON:  05/11/2019 FINDINGS: C-arm images show vertebral augmentation at the L1 and L2 levels. No complicating features on these limited images. IMPRESSION: L1 and L2 vertebral augmentation. Electronically Signed   By: Nelson Chimes M.D.   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  Presented back to the emergency room with complaints of worsening back pain.    1.  Back pain secondary to compression fracture. Patient recently discharged from the hospital following kyphoplasty for L4 compression fracture.  Was discharged to skilled nursing facility and subsequently home.  Back with worsening back pains. MRI of the lumbar spine revealed Patient had MRI of the lumbar spine done today which revealed new acute minimal L1 superior endplate compression fracture withapproximately 10% height loss and trace retropulsion. Unchanged mild L3 and L4 compression deformity status post Kyphoplasty.  No urinary incontinence or retention.  No fecal incontinence.  Patient reports ambulating with assistance at home prior to admission.  L1-L2 kyphoplasty done 05/13/19.  2.  Hypertension; uncontrolled Likely due to pains. Resumed home blood pressure meds.  Placed on PRN IV hydralazine with parameters.  3.  Hypothyroidism. Resume Synthroid.  TSH level in a.m.  4.  History of depression Stable.  Continue Celexa.  5.  History of asthma Stable.  6.  Abdominal pain complaint-x-ray KUB to rule out obstruction. Give stool softeners. Dulcolax suppository.  7. UTI- cx shows E fecalis  Amoxicilline  All the records are reviewed and case discussed with Care Management/Social Workerr. Management plans discussed with the patient, family and they are in agreement.  CODE STATUS: Full.  TOTAL TIME TAKING CARE OF THIS PATIENT: 35 minutes.   I spoke to patient's sister on the phone.  POSSIBLE D/C IN 1-2 DAYS, DEPENDING ON  CLINICAL CONDITION.   Vaughan Basta M.D on 05/14/2019   Between 7am to 6pm - Pager - [phone]  After 6pm go to [email or web address] - password [email or web address] [facility]  Office  [phone]  CC: Primary care physician; Zhou-Talbert, Elwyn Lade, MD  Note: This dictation was prepared with Dragon dictation along with smaller phrase technology. Any transcriptional errors that result from this process are unintentional.

## 2019-05-14 NOTE — Progress Notes (Signed)
PT Cancellation Note  Patient Details Name: Karen Huhta MRN: [id number] DOB: [birth year]   Cancelled Treatment:    Reason Eval/Treat Not Completed: Pain limiting ability to participate(Consult received and chart reviewed.  Attending physician making rounds with patient upon arrival to room.  Reports patient in significant pain/discomfort; recommends hold and re-attempt in PM.  Will continue efforts as appropriate.)   Mylene Bow H. Owens Shark, PT, DPT, NCS 05/14/19, 12:50 PM [phone]

## 2019-05-14 NOTE — Evaluation (Signed)
Physical Therapy Evaluation Patient Details Name: Marilynn Ekstein MRN: [id number] DOB: [birth year] Today's Date: 05/14/2019   History of Present Illness  presented to ER from home due to worsening back pain; admitted for management of L1-L2 compression fracture, s/p kyphoplasty 7/7.  Of note, previous L3-4 kyphoplasty 6/16; brief stay at [facility] upon discharge with return home.  Clinical Impression  Upon evaluation, patient alert and oriented; follows commands and agreeable to session.  Visibly restless and uncomfortable, constantly attempting to reposition self for pain relief.  Voices interested in attempting OOB to chair for pain control, but requests to attempt OOB to [facility] first.  Currently requiring min/mod assist for bed mobility; min assist for sit/stand, basic transfers and gait (5'x2) with RW.  Displays short, shuffling steps with heavy WBing bilat UEs; constant cuing for walker position/use and overall safety.  Generally impulsive and restless, constantly attempting to find different position for relief; multiple episodes of dry heaving throughout session. Unable to tolerate additional gait distance at this time; returned to bed per RN request for suppository.  Will continue mobility assessment/progression as medically appropriate. Would benefit from skilled PT to address above deficits and promote optimal return to PLOF; recommend transition to STR upon discharge from acute hospitalization.     Follow Up Recommendations SNF    Equipment Recommendations       Recommendations for Other Services       Precautions / Restrictions Precautions Precautions: Fall;Back Restrictions Weight Bearing Restrictions: No      Mobility  Bed Mobility Overal bed mobility: Needs Assistance Bed Mobility: Supine to Sit;Sit to Supine     Supine to sit: Min assist;Mod assist Sit to supine: Mod assist;Max assist   General bed mobility comments: guarded due to back/abdominal  pain  Transfers Overall transfer level: Needs assistance   Transfers: Sit to/from Stand Sit to Stand: Min assist            Ambulation/Gait Ambulation/Gait assistance: Min assist Gait Distance (Feet): 5 Feet(x2) Assistive device: Rolling walker (2 wheeled)       General Gait Details: short, shuffling steps; forward flexed posture, heavy WBing bilat UEs. Min assist to negotiate RW  Stairs            Wheelchair Mobility    Modified Rankin (Stroke Patients Only)       Balance Overall balance assessment: Needs assistance Sitting-balance support: No upper extremity supported;Feet supported Sitting balance-Leahy Scale: Fair     Standing balance support: Bilateral upper extremity supported Standing balance-Leahy Scale: Poor                               Pertinent Vitals/Pain Pain Assessment: Faces Faces Pain Scale: Hurts whole lot Pain Location: back, abdomen Pain Descriptors / Indicators: Grimacing;Moaning Pain Intervention(s): Limited activity within patient's tolerance;Monitored during session;Repositioned;Patient requesting pain meds-RN notified    Home Living Family/patient expects to be discharged to:: Skilled nursing facility                 Additional Comments: At baseline, lives alone in single-story home with rampted entrance; sister provides check in/assist as needed    Prior Function Level of Independence: Independent with assistive device(s)         Comments: Mod indep with at baseline; progressive decline due to worsening back pain in recent days     Hand Dominance        Extremity/Trunk Assessment   Upper Extremity Assessment Upper  Extremity Assessment: Overall WFL for tasks assessed    Lower Extremity Assessment Lower Extremity Assessment: Generalized weakness(grossly 4-/5 throughout; denies sensory deficit or focal weakness)       Communication   Communication: No difficulties  Cognition   Behavior  During Therapy: WFL for tasks assessed/performed Overall Cognitive Status: Within Functional Limits for tasks assessed                                        General Comments      Exercises Other Exercises Other Exercises: Toilet transfer, SPT wiht RW, min assist; generally restless and uncomfortable, constantly dry heaving throughout session.  RN informed/aware and at bedside end of session for suppository.   Assessment/Plan    PT Assessment Patient needs continued PT services  PT Problem List Decreased strength;Decreased range of motion;Decreased activity tolerance;Decreased balance;Decreased mobility;Decreased knowledge of use of DME;Decreased safety awareness;Pain;Decreased knowledge of precautions;Decreased cognition       PT Treatment Interventions Gait training;DME instruction;Functional mobility training;Therapeutic activities;Therapeutic exercise;Balance training;Neuromuscular re-education;Patient/family education    PT Goals (Current goals can be found in the Care Plan section)  Acute Rehab PT Goals Patient Stated Goal: to make pain better PT Goal Formulation: With patient Time For Goal Achievement: 05/28/19 Potential to Achieve Goals: Fair    Frequency 7X/week   Barriers to discharge Decreased caregiver support      Co-evaluation               AM-PAC PT "6 Clicks" Mobility  Outcome Measure Help needed turning from your back to your side while in a flat bed without using bedrails?: A Little Help needed moving from lying on your back to sitting on the side of a flat bed without using bedrails?: A Little Help needed moving to and from a bed to a chair (including a wheelchair)?: A Little Help needed standing up from a chair using your arms (e.g., wheelchair or bedside chair)?: A Little Help needed to walk in hospital room?: A Lot Help needed climbing 3-5 steps with a railing? : A Lot 6 Click Score: 16    End of Session Equipment Utilized  During Treatment: Gait belt Activity Tolerance: Patient tolerated treatment well Patient left: in bed;with call bell/phone within reach;with bed alarm set Nurse Communication: Mobility status PT Visit Diagnosis: Muscle weakness (generalized) (M62.81);Difficulty in walking, not elsewhere classified (R26.2);Pain    Time: 8502-7741 PT Time Calculation (min) (ACUTE ONLY): 18 min   Charges:   PT Evaluation $PT Eval Moderate Complexity: 1 Mod PT Treatments $Therapeutic Activity: 8-22 mins        Katrin Grabel H. Owens Shark, PT, DPT, NCS 05/14/19, 4:43 PM [phone]

## 2019-05-15 NOTE — Progress Notes (Signed)
Physical Therapy Treatment Patient Details Name: Veronica Hubbard MRN: [id number] DOB: 1935/04/23 Today's Date: 05/15/2019    History of Present Illness presented to ER from home due to worsening back pain; admitted for management of L1-L2 compression fracture, s/p kyphoplasty 7/7.  Of note, previous L3-4 kyphoplasty 6/16; brief stay at [facility] upon discharge with return home.    PT Comments    Pt in bed, agrees to session.  To edge of bed with min a x 1.  Reports nausea and requests emesis bag.  Stood and was able to ambulate to recliner with min a x 1.  Reports L hip pain today and hesitant to WB.  Remained up in recliner but session limited by nausea.     Follow Up Recommendations  SNF     Equipment Recommendations  Rolling walker with 5" wheels    Recommendations for Other Services       Precautions / Restrictions Precautions Precautions: Fall;Back Precaution Comments: s/p kyphoplasty Restrictions Weight Bearing Restrictions: No RLE Weight Bearing: Weight bearing as tolerated LLE Weight Bearing: Weight bearing as tolerated    Mobility  Bed Mobility Overal bed mobility: Needs Assistance Bed Mobility: Supine to Sit Rolling: Min assist Sidelying to sit: Min assist Supine to sit: Min assist        Transfers Overall transfer level: Needs assistance Equipment used: Rolling walker (2 wheeled) Transfers: Sit to/from Stand Sit to Stand: Min assist            Ambulation/Gait Ambulation/Gait assistance: Herbalist (Feet): 5 Feet Assistive device: Rolling walker (2 wheeled) Gait Pattern/deviations: Step-through pattern;Step-to pattern Gait velocity: decreased   General Gait Details: irregular pattern, hesitant to WB LLE due to pain.   Stairs             Wheelchair Mobility    Modified Rankin (Stroke Patients Only)       Balance Overall balance assessment: Needs assistance Sitting-balance support: Feet supported Sitting  balance-Leahy Scale: Fair     Standing balance support: Bilateral upper extremity supported Standing balance-Leahy Scale: Poor                              Cognition Arousal/Alertness: Awake/alert Behavior During Therapy: WFL for tasks assessed/performed Overall Cognitive Status: Within Functional Limits for tasks assessed                                        Exercises      General Comments        Pertinent Vitals/Pain Pain Assessment: Faces Faces Pain Scale: Hurts even more Pain Location: Left hip Pain Descriptors / Indicators: Grimacing;Moaning Pain Intervention(s): Limited activity within patient's tolerance;Monitored during session;Repositioned    Home Living                      Prior Function            PT Goals (current goals can now be found in the care plan section) Progress towards PT goals: Progressing toward goals    Frequency    7X/week      PT Plan Current plan remains appropriate    Co-evaluation              AM-PAC PT "6 Clicks" Mobility   Outcome Measure  Help needed turning from your back to your side  while in a flat bed without using bedrails?: A Little Help needed moving from lying on your back to sitting on the side of a flat bed without using bedrails?: A Little Help needed moving to and from a bed to a chair (including a wheelchair)?: A Little Help needed standing up from a chair using your arms (e.g., wheelchair or bedside chair)?: A Little Help needed to walk in hospital room?: A Little Help needed climbing 3-5 steps with a railing? : A Lot 6 Click Score: 17    End of Session Equipment Utilized During Treatment: Gait belt Activity Tolerance: Patient tolerated treatment well Patient left: in chair;with call bell/phone within reach;with chair alarm set         Time: 9150-4136 PT Time Calculation (min) (ACUTE ONLY): 15 min  Charges:  $Therapeutic Activity: 8-22 mins                     Chesley Noon, PTA 05/15/19, 10:51 AM

## 2019-05-15 NOTE — Plan of Care (Signed)
  Problem: Pain Managment: Goal: General experience of comfort will improve Outcome: Progressing   Problem: Safety: Goal: Ability to remain free from injury will improve Outcome: Progressing   Problem: Education: Goal: Knowledge of General Education information will improve Description: Including pain rating scale, medication(s)/side effects and non-pharmacologic comfort measures Outcome: Progressing   Problem: Health Behavior/Discharge Planning: Goal: Ability to manage health-related needs will improve Outcome: Progressing   Problem: Clinical Measurements: Goal: Ability to maintain clinical measurements within normal limits will improve Outcome: Progressing Goal: Will remain free from infection Outcome: Progressing Goal: Diagnostic test results will improve Outcome: Progressing Goal: Respiratory complications will improve Outcome: Progressing Goal: Cardiovascular complication will be avoided Outcome: Progressing   Problem: Activity: Goal: Risk for activity intolerance will decrease Outcome: Progressing   Problem: Nutrition: Goal: Adequate nutrition will be maintained Outcome: Progressing   Problem: Coping: Goal: Level of anxiety will decrease Outcome: Progressing   Problem: Elimination: Goal: Will not experience complications related to bowel motility Outcome: Progressing Goal: Will not experience complications related to urinary retention Outcome: Progressing   Problem: Skin Integrity: Goal: Risk for impaired skin integrity will decrease Outcome: Progressing

## 2019-05-15 NOTE — Progress Notes (Signed)
Foley discontinued per order.

## 2019-05-15 NOTE — Progress Notes (Signed)
[place] at [place] NAME: Veronica Hubbard    MR#:  [id number]  DATE OF BIRTH:  05-17-1935  SUBJECTIVE:  CHIEF COMPLAINT:   Chief Complaint  Patient presents with  . Back Pain   Recent surgery on lumbar spinal fractures with vertebral fusion.  Came again with significant back pain and noted to have L1 and L2 compression fractures. Status post kyphoplasty 05/13/19. She had bowel movement in response to stool softeners and nausea and abdominal pain is resolved now.  REVIEW OF SYSTEMS:  CONSTITUTIONAL: No fever, fatigue or weakness.  EYES: No blurred or double vision.  EARS, NOSE, AND THROAT: No tinnitus or ear pain.  RESPIRATORY: No cough, shortness of breath, wheezing or hemoptysis.  CARDIOVASCULAR: No chest pain, orthopnea, edema.  GASTROINTESTINAL: No nausea, vomiting, no diarrhea, no abdominal pain.  GENITOURINARY: No dysuria, hematuria.  ENDOCRINE: No polyuria, nocturia,  HEMATOLOGY: No anemia, easy bruising or bleeding SKIN: No rash or lesion. MUSCULOSKELETAL: No joint pain or arthritis.   NEUROLOGIC: No tingling, numbness, weakness.  PSYCHIATRY: No anxiety or depression.   ROS  DRUG ALLERGIES:   Allergies  Allergen Reactions  . Aspirin Shortness Of Breath  . Celecoxib Shortness Of Breath  . Iodinated Diagnostic Agents Other (See Comments)    Reaction:  Decreased kidney function  . Losartan Itching  . Morphine Nausea And Vomiting  . Verapamil Itching    VITALS:  Blood pressure (!) 145/99, pulse 66, temperature (!) 97.4 F (36.3 C), temperature source Oral, resp. rate 19, height 5\' 3"  (1.6 m), weight 59 kg, SpO2 98 %.  PHYSICAL EXAMINATION:  GENERAL:  84 y.o.-year-old patient lying in the bed with  acute distress- due to pain in back .  EYES: Pupils equal, round, reactive to light and accommodation. No scleral icterus. Extraocular muscles intact.  HEENT: Head atraumatic, normocephalic. Oropharynx and nasopharynx clear.   NECK:  Supple, no jugular venous distention. No thyroid enlargement, no tenderness.  LUNGS: Normal breath sounds bilaterally, no wheezing, rales,rhonchi or crepitation. No use of accessory muscles of respiration.  CARDIOVASCULAR: S1, S2 normal. No murmurs, rubs, or gallops.  ABDOMEN: Soft, tender, nondistended. Bowel sounds present. No organomegaly or mass.  Tender in the vertebral area around lumbar region. EXTREMITIES: No pedal edema, cyanosis, or clubbing.  NEUROLOGIC: Cranial nerves II through XII are intact. Muscle strength 4/5 in all extremities. Sensation intact. Gait not checked.  PSYCHIATRIC: The patient is alert and oriented x 2.  SKIN: No obvious rash, lesion, or ulcer.   Physical Exam LABORATORY PANEL:   CBC Recent Labs  Lab 05/14/19 0413  WBC 22.7*  HGB 12.5  HCT 39.3  PLT 278   ------------------------------------------------------------------------------------------------------------------  Chemistries  Recent Labs  Lab 05/11/19 0654 05/12/19 0458 05/14/19 0413  NA 137 138 136  K 3.6 4.0 3.3*  CL 103 102 104  CO2 27 30 24   GLUCOSE 96 87 116*  BUN 24* 21 16  CREATININE 1.21* 1.14* 1.01*  CALCIUM 10.6* 10.6* 10.2  MG  --  2.1  --   AST 17  --   --   ALT 6  --   --   ALKPHOS 75  --   --   BILITOT 0.6  --   --    ------------------------------------------------------------------------------------------------------------------  Cardiac Enzymes No results for input(s): TROPONINI in the last 168 hours. ------------------------------------------------------------------------------------------------------------------  RADIOLOGY:  Dg Abd 1 View  Result Date: 05/14/2019 CLINICAL DATA:  84 year old female with nausea and vomiting. EXAM: ABDOMEN -  1 VIEW COMPARISON:  Abdominal radiograph dated 05/13/2019 FINDINGS: There is moderate amount of stool throughout the colon. No bowel dilatation or evidence of obstruction. No free air. Right renal inferior pole  calculus again noted. There is osteopenia with multilevel vertebroplasty changes. Interval vertebroplasty of L1 and L2 since the prior radiograph. Bilateral hip osteoarthritic changes. No acute osseous pathology. Atherosclerotic calcification of the aorta. IMPRESSION: 1. Constipation. No bowel obstruction. 2. Right renal calculus. Electronically Signed   By: Anner Crete M.D.   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  Presented back to the emergency room with complaints of worsening back pain.    1.  Back pain secondary to compression fracture. Patient recently discharged from the hospital following kyphoplasty for L4 compression fracture.  Was discharged to skilled nursing facility and subsequently home.  Back with worsening back pains. MRI of the lumbar spine revealed Patient had MRI of the lumbar spine done today which revealed new acute minimal L1 superior endplate compression fracture withapproximately 10% height loss and trace retropulsion. Unchanged mild L3 and L4 compression deformity status post Kyphoplasty.  No urinary incontinence or retention.  No fecal incontinence.  Patient reports ambulating with assistance at home prior to admission.  L1-L2 kyphoplasty done 05/13/19.  2.  Hypertension; uncontrolled Likely due to pains. Resumed home blood pressure meds.  Placed on PRN IV hydralazine with parameters.  3.  Hypothyroidism. Resume Synthroid.  TSH level was in therapeutic range.  4.  History of depression Stable.  Continue Celexa.  5.  History of  asthma Stable.  6.  Abdominal pain complaint-x-ray KUB to rule out obstruction. Give stool softeners. Dulcolax suppository.  7. UTI- cx shows E fecalis   Amoxicilline  All the records are reviewed and case discussed with Care Management/Social Workerr. Management plans discussed with the patient, family and they are in agreement.  CODE STATUS: Full.  TOTAL TIME TAKING CARE OF THIS PATIENT: 35 minutes.    POSSIBLE D/C IN 1-2 DAYS, DEPENDING ON CLINICAL CONDITION.   Vaughan Basta M.D on 05/15/2019   Between 7am to 6pm - Pager - [phone]  After 6pm go to [email or web address] - password [email or web address] [place] [facility]  Office  [phone]  CC: Primary care physician; Zhou-Talbert, Elwyn Lade, MD  Note: This dictation was prepared with Dragon dictation along with smaller phrase technology. Any transcriptional errors that result from this process are unintentional.

## 2019-05-15 NOTE — Care Management Important Message (Signed)
Important Message  Patient Details  Name: Veronica Hubbard MRN: [id number] Date of Birth: 06/28/1935   Medicare Important Message Given:  Yes     Su Hilt, RN 05/15/2019, 9:27 AM

## 2019-05-16 ENCOUNTER — Inpatient Hospital Stay: Payer: Medicare HMO

## 2019-05-16 LAB — BASIC METABOLIC PANEL
Anion gap: 7 (ref 5–15)
BUN: 23 mg/dL (ref 8–23)
CO2: 25 mmol/L (ref 22–32)
Calcium: 10.6 mg/dL — ABNORMAL HIGH (ref 8.9–10.3)
Chloride: 102 mmol/L (ref 98–111)
Creatinine, Ser: 1.24 mg/dL — ABNORMAL HIGH (ref 0.44–1.00)
GFR calc Af Amer: 47 mL/min — ABNORMAL LOW (ref >60)
GFR calc non Af Amer: 40 mL/min — ABNORMAL LOW (ref >60)
Glucose, Bld: 104 mg/dL — ABNORMAL HIGH (ref 70–99)
Potassium: 4.2 mmol/L (ref 3.5–5.1)
Sodium: 134 mmol/L — ABNORMAL LOW (ref 135–145)

## 2019-05-16 LAB — CBC
HCT: 40.3 % (ref 36.0–46.0)
Hemoglobin: 12.7 g/dL (ref 12.0–15.0)
MCH: 27.2 pg (ref 26.0–34.0)
MCHC: 31.5 g/dL (ref 30.0–36.0)
MCV: 86.3 fL (ref 80.0–100.0)
Platelets: 324 10*3/uL (ref 150–400)
RBC: 4.67 MIL/uL (ref 3.87–5.11)
RDW: 21 % — ABNORMAL HIGH (ref 11.5–15.5)
WBC: 27.3 10*3/uL — ABNORMAL HIGH (ref 4.0–10.5)
nRBC: 0.1 % (ref 0.0–0.2)

## 2019-05-16 MED ORDER — SODIUM CHLORIDE 0.9 % IV SOLN
INTRAVENOUS | Status: DC
  Administered 2019-05-16 – 2019-05-19 (×5): via INTRAVENOUS

## 2019-05-16 MED ORDER — ENSURE ENLIVE PO LIQD
237.0000 mL | Freq: Two times a day (BID) | ORAL | Status: DC
  Administered 2019-05-17 – 2019-05-18 (×3): 237 mL via ORAL

## 2019-05-16 MED ORDER — SERTRALINE HCL 50 MG PO TABS
25.0000 mg | ORAL_TABLET | Freq: Every day | ORAL | Status: DC
  Administered 2019-05-16 – 2019-05-19 (×4): 25 mg via ORAL
  Filled 2019-05-16 (×4): qty 1

## 2019-05-16 NOTE — Progress Notes (Signed)
Sister of patient has been updated twice this shift regarding patient's condition/comfort level.

## 2019-05-16 NOTE — Progress Notes (Signed)
[place] at [place] NAME: Veronica Hubbard    MR#:  [id number]  DATE OF BIRTH:  07-23-1935  SUBJECTIVE:  CHIEF COMPLAINT:   Chief Complaint  Patient presents with  . Back Pain   Recent surgery on lumbar spinal fractures with vertebral fusion.  Came again with significant back pain and noted to have L1 and L2 compression fractures. Status post kyphoplasty 05/13/19. She had bowel movement in response to stool softeners and nausea  is resolved now. Today she again had pain in her left hip area and her white blood cell count is high.  REVIEW OF SYSTEMS:  CONSTITUTIONAL: No fever, fatigue or weakness.  EYES: No blurred or double vision.  EARS, NOSE, AND THROAT: No tinnitus or ear pain.  RESPIRATORY: No cough, shortness of breath, wheezing or hemoptysis.  CARDIOVASCULAR: No chest pain, orthopnea, edema.  GASTROINTESTINAL: No nausea, vomiting, no diarrhea, no abdominal pain.  GENITOURINARY: No dysuria, hematuria.  ENDOCRINE: No polyuria, nocturia,  HEMATOLOGY: No anemia, easy bruising or bleeding SKIN: No rash or lesion. MUSCULOSKELETAL: No joint pain or arthritis.   NEUROLOGIC: No tingling, numbness, weakness.  PSYCHIATRY: No anxiety or depression.   ROS  DRUG ALLERGIES:   Allergies  Allergen Reactions  . Aspirin Shortness Of Breath  . Celecoxib Shortness Of Breath  . Iodinated Diagnostic Agents Other (See Comments)    Reaction:  Decreased kidney function  . Losartan Itching  . Morphine Nausea And Vomiting  . Verapamil Itching    VITALS:  Blood pressure 137/66, pulse (!) 58, temperature 98.2 F (36.8 C), temperature source Oral, resp. rate 17, height 5\' 3"  (1.6 m), weight 59 kg, SpO2 97 %.  PHYSICAL EXAMINATION:  GENERAL:  84 y.o.-year-old patient lying in the bed with  acute distress- due to pain in back .  EYES: Pupils equal, round, reactive to light and accommodation. No scleral icterus. Extraocular muscles intact.  HEENT:  Head atraumatic, normocephalic. Oropharynx and nasopharynx clear.  NECK:  Supple, no jugular venous distention. No thyroid enlargement, no tenderness.  LUNGS: Normal breath sounds bilaterally, no wheezing, rales,rhonchi or crepitation. No use of accessory muscles of respiration.  CARDIOVASCULAR: S1, S2 normal. No murmurs, rubs, or gallops.  ABDOMEN: Soft, tender, nondistended. Bowel sounds present. No organomegaly or mass.  Tender in the vertebral area around lumbar region. EXTREMITIES: No pedal edema, cyanosis, or clubbing.  Tender in left hip area on local pressure. NEUROLOGIC: Cranial nerves II through XII are intact. Muscle strength 4/5 in all extremities. Sensation intact. Gait not checked.  PSYCHIATRIC: The patient is alert and oriented x 2.  SKIN: No obvious rash, lesion, or ulcer.   Physical Exam LABORATORY PANEL:   CBC Recent Labs  Lab 05/16/19 0447  WBC 27.3*  HGB 12.7  HCT 40.3  PLT 324   ------------------------------------------------------------------------------------------------------------------  Chemistries  Recent Labs  Lab 05/11/19 0654 05/12/19 0458  05/16/19 0447  NA 137 138   < > 134*  K 3.6 4.0   < > 4.2  CL 103 102   < > 102  CO2 27 30   < > 25  GLUCOSE 96 87   < > 104*  BUN 24* 21   < > 23  CREATININE 1.21* 1.14*   < > 1.24*  CALCIUM 10.6* 10.6*   < > 10.6*  MG  --  2.1  --   --   AST 17  --   --   --   ALT 6  --   --   --  ALKPHOS 75  --   --   --   BILITOT 0.6  --   --   --    < > = values in this interval not displayed.   ------------------------------------------------------------------------------------------------------------------  Cardiac Enzymes No results for input(s): TROPONINI in the last 168 hours. ------------------------------------------------------------------------------------------------------------------  RADIOLOGY:  Ct Abdomen Pelvis Wo Contrast  Result Date: 05/16/2019 CLINICAL DATA:  Nontraumatic compression  fracture of lumbar spine. History of hypertension, hypothyroidism chronic leukocytosis. Recent kyphoplasty for compression fracture. Now with worsening back pain. EXAM: CT ABDOMEN AND PELVIS WITHOUT CONTRAST TECHNIQUE: Multidetector CT imaging of the abdomen and pelvis was performed following the standard protocol without IV contrast. COMPARISON:  MRI lumbar spine dated 05/11/2019. CT abdomen dated 04/24/2019. FINDINGS: Lower chest: No acute abnormality. Hepatobiliary: No focal liver abnormality is seen. No gallstones, gallbladder wall thickening, or biliary dilatation. Pancreas: Unremarkable. No pancreatic ductal dilatation or surrounding inflammatory changes. Spleen: Splenomegaly. Adrenals/Urinary Tract: Adrenal glands are unremarkable. Bilateral renal cysts. 8 mm nonobstructing RIGHT renal stone. No ureteral or bladder calculi identified. Small amount of air within the bladder, presumably related to recent catheterization. Bladder otherwise unremarkable. Stomach/Bowel: No dilated large or small bowel loops. No evidence of bowel wall inflammation. Scattered diverticulosis of the sigmoid and descending colon but no focal inflammatory change to suggest acute diverticulitis. Moderate amount of stool throughout the nondistended colon. Stomach is unremarkable, partially decompressed. Vascular/Lymphatic: Fairly extensive atherosclerosis of the ectatic abdominal aorta and branch vessels. Peri renal abdominal aortic aneurysm measures 3.9 x 3.8 cm, only slightly increased in size compared to an earlier CT abdomen of 02/23/2016, stable compared to more recent studies. No periaortic fluid or inflammation. No enlarged lymph nodes seen in the abdomen or pelvis. Reproductive: Presumed hysterectomy. No adnexal mass or free fluid. Other: No free fluid or abscess collection. No free intraperitoneal air. Musculoskeletal: Interval kyphoplasties at the L1-L2 levels, without evidence of significant procedural complicating feature.  Incidental note made of a small amount of methylmethacrylate within ventral epidural veins at the L1 and L2 levels. No acute or suspicious osseous finding. IMPRESSION: 1. No acute intra-abdominal or intrapelvic findings. 2. Stable infrarenal abdominal aortic aneurysm, measuring 3.9 x 3.8 cm, only slightly increased in size compared to an earlier CT abdomen of 02/23/2016. 3. Diverticulosis without evidence of diverticulitis. 4. Small amount of air within the bladder, presumably related to recent catheterization. If there is concern for infection, recommend correlation with urinalysis. 5. Interval kyphoplasties at the L1-L2 levels, without evidence of significant procedural complicating feature. 6. 8 mm nonobstructing RIGHT renal stone. 7. Splenomegaly. This is not significantly changed compared to multiple prior exams. 8.  Aortic Atherosclerosis (ICD10-I70.0). Electronically Signed   By: Franki Cabot M.D.   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  Presented back to the emergency room with complaints of worsening back pain.    1.  Back pain secondary to compression fracture. Patient recently discharged from the hospital following kyphoplasty for L4 compression fracture.  Was discharged to skilled nursing facility and subsequently home.  Back with worsening back pains. MRI of the lumbar spine revealed Patient had MRI of the  lumbar spine done today which revealed new acute minimal L1 superior endplate compression fracture withapproximately 10% height loss and trace retropulsion.  Unchanged mild L3 and L4 compression deformity status post Kyphoplasty.  No urinary incontinence or retention.  No fecal incontinence.  Patient reports ambulating with assistance at home prior to admission.  L1-L2 kyphoplasty done 05/13/19.  2.  Hypertension; uncontrolled Likely due to pains. Resumed home blood pressure meds.  Placed on PRN IV hydralazine with parameters.  3.  Hypothyroidism. Resume Synthroid.  TSH level was in therapeutic range.  4.  History of depression Stable.  Continue Celexa.  5.  History of asthma Stable.  6.  Abdominal pain complaint-x-ray KUB to rule out obstruction. Give stool softeners. Dulcolax suppository.  7. UTI- cx shows E fecalis   Amoxicilline  8.  Left hip pain We will get CT abdomen and pelvis.  All the records are reviewed and case discussed with Care Management/Social Workerr. Management plans discussed with the patient, family and they are in agreement.  CODE STATUS: Full.  TOTAL TIME TAKING CARE OF THIS PATIENT: 35 minutes.    POSSIBLE D/C IN 1-2 DAYS, DEPENDING ON CLINICAL CONDITION.   Vaughan Basta M.D on 05/16/2019   Between 7am to 6pm - Pager - [phone]  After 6pm go to [email or web address] - password [email or web address] [place] [facility]  Office  [phone]  CC: Primary care physician; Zhou-Talbert, Elwyn Lade, MD  Note: This dictation was prepared with Dragon dictation along with smaller phrase technology. Any transcriptional errors that result from this process are unintentional.

## 2019-05-16 NOTE — Progress Notes (Signed)
Physical Therapy Treatment Patient Details Name: Veronica Hubbard MRN: [id number] DOB: Sep 28, 1935 Today's Date: 05/16/2019    History of Present Illness presented to ER from home due to worsening back pain; admitted for management of L1-L2 compression fracture, s/p kyphoplasty 7/7.  Of note, previous L3-4 kyphoplasty 6/16; brief stay at [facility] upon discharge with return home.    PT Comments    Pt is making limited progress towards goals secondary to unrelenting pain. Pt very tearful upon arrival and has little motivation to work with therapy. Assisted pt with cleaning/hygiene on [facility] and then assisted back to bed. Will continue to progress if pt able to tolerate therapy intensity.   Follow Up Recommendations  SNF     Equipment Recommendations  Rolling walker with 5" wheels    Recommendations for Other Services       Precautions / Restrictions Precautions Precautions: Fall;Back Precaution Booklet Issued: No Precaution Comments: s/p kyphoplasty Required Braces or Orthoses: Spinal Brace Spinal Brace: Lumbar corset Restrictions Weight Bearing Restrictions: No RLE Weight Bearing: Weight bearing as tolerated LLE Weight Bearing: Weight bearing as tolerated    Mobility  Bed Mobility Overal bed mobility: Needs Assistance Bed Mobility: Sit to Supine       Sit to supine: Mod assist   General bed mobility comments: guarded. Needs assist for B LEs. Once in bed, prefers to be in sidelying position with pillow between legs  Transfers Overall transfer level: Needs assistance Equipment used: Rolling walker (2 wheeled) Transfers: Sit to/from Stand Sit to Stand: Min assist         General transfer comment: very weak and takes extended time for transfer. Forward flexed posture. Received on [facility] without brace on.  Ambulation/Gait Ambulation/Gait assistance: Min assist Gait Distance (Feet): 2 Feet Assistive device: Rolling walker (2 wheeled) Gait Pattern/deviations: Step-to  pattern     General Gait Details: slow steps from [facility] to bed. Needs cues for RW management   Stairs             Wheelchair Mobility    Modified Rankin (Stroke Patients Only)       Balance Overall balance assessment: Needs assistance Sitting-balance support: Feet supported Sitting balance-Leahy Scale: Fair     Standing balance support: Bilateral upper extremity supported Standing balance-Leahy Scale: Fair                              Cognition Arousal/Alertness: Awake/alert Behavior During Therapy: (tearful) Overall Cognitive Status: Within Functional Limits for tasks assessed                                        Exercises Other Exercises Other Exercises: toilet transfer using RW. Min assist for transfer and supervision for hygiene. Pt very tearful as her stomach hurts    General Comments        Pertinent Vitals/Pain Pain Assessment: 0-10 Pain Score: 10-Worst pain ever Pain Location: hip/back/stomach Pain Descriptors / Indicators: Grimacing;Moaning Pain Intervention(s): Limited activity within patient's tolerance;Repositioned    Home Living                      Prior Function            PT Goals (current goals can now be found in the care plan section) Acute Rehab PT Goals Patient Stated Goal: to make pain better  PT Goal Formulation: With patient Time For Goal Achievement: 05/28/19 Potential to Achieve Goals: Fair Progress towards PT goals: Progressing toward goals    Frequency    7X/week      PT Plan Current plan remains appropriate    Co-evaluation              AM-PAC PT "6 Clicks" Mobility   Outcome Measure  Help needed turning from your back to your side while in a flat bed without using bedrails?: A Little Help needed moving from lying on your back to sitting on the side of a flat bed without using bedrails?: A Lot Help needed moving to and from a bed to a chair (including a  wheelchair)?: A Little Help needed standing up from a chair using your arms (e.g., wheelchair or bedside chair)?: A Little Help needed to walk in hospital room?: A Lot Help needed climbing 3-5 steps with a railing? : Total 6 Click Score: 14    End of Session   Activity Tolerance: Patient limited by pain Patient left: in bed;with bed alarm set Nurse Communication: Mobility status PT Visit Diagnosis: Muscle weakness (generalized) (M62.81);Difficulty in walking, not elsewhere classified (R26.2);Pain Pain - Right/Left: Left Pain - part of body: Leg     Time: 8381-8403 PT Time Calculation (min) (ACUTE ONLY): 14 min  Charges:  $Therapeutic Activity: 8-22 mins                     Greggory Stallion, PT, DPT [phone]    Ronan Dion 05/16/2019, 4:51 PM

## 2019-05-16 NOTE — Progress Notes (Signed)
PT Cancellation Note  Patient Details Name: Veronica Hubbard MRN: [id number] DOB: [birth year]   Cancelled Treatment:    Reason Eval/Treat Not Completed: Other (comment). Pt currently pending CT imaging for possible L hip fx. Of note, pt having pain and difficulty with WBing last session. Will hold for results prior to initiating therapy. Also spoke to MD who said to dc current isolation precautions.   Gamaliel Charney 05/16/2019, 1:36 PM  Greggory Stallion, PT, DPT [phone]

## 2019-05-16 NOTE — Progress Notes (Signed)
MD notified that patient has been sleeping most of day. Has not taken any meal today. Limited fluids taken PO with meds.  No obvious sign of pain, no verbalization of pain.   Recent labs reported to MD as elevated from labs on 05/14/2019.   Will continue to observe patient.

## 2019-05-16 NOTE — TOC Progression Note (Signed)
Transition of Care [facility]) - Progression Note    Patient Details  Name: Veronica Hubbard MRN: [id number] Date of Birth: 1935/04/14  Transition of Care [facility]) CM/SW Contact  Su Hilt, RN Phone Number: 05/16/2019, 8:34 AM  Clinical Narrative:     [facility] and gave auth approval number [id number] ref number [id number] next review 7/13 start of care 7/10, Reviewer Nunzio Cory.  I called [facility] and provided her with the information and she will call me back with a room number  Expected Discharge Plan: [place] Barriers to Discharge: Continued Medical Work up  Expected Discharge Plan and Services Expected Discharge Plan: [place]   Discharge Planning Services: CM Consult Post Acute Care Choice: [place] arrangements for the past 2 months: Single Family Home                 DME Arranged: N/A         HH Arranged: PT HH Agency: [place] Date [place]: 05/12/19 Time [place]: 1133 Representative spoke with at [place]: [place] ([place]) Interventions    Readmission Risk Interventions No flowsheet data found.

## 2019-05-17 DIAGNOSIS — D469 Myelodysplastic syndrome, unspecified: Secondary | ICD-10-CM | POA: Diagnosis not present

## 2019-05-17 LAB — BASIC METABOLIC PANEL
Anion gap: 10 (ref 5–15)
BUN: 23 mg/dL (ref 8–23)
CO2: 22 mmol/L (ref 22–32)
Calcium: 10.6 mg/dL — ABNORMAL HIGH (ref 8.9–10.3)
Chloride: 103 mmol/L (ref 98–111)
Creatinine, Ser: 1.41 mg/dL — ABNORMAL HIGH (ref 0.44–1.00)
GFR calc Af Amer: 40 mL/min — ABNORMAL LOW (ref >60)
GFR calc non Af Amer: 35 mL/min — ABNORMAL LOW (ref >60)
Glucose, Bld: 89 mg/dL (ref 70–99)
Potassium: 4.4 mmol/L (ref 3.5–5.1)
Sodium: 135 mmol/L (ref 135–145)

## 2019-05-17 LAB — CBC
HCT: 38.4 % (ref 36.0–46.0)
Hemoglobin: 12.1 g/dL (ref 12.0–15.0)
MCH: 27.4 pg (ref 26.0–34.0)
MCHC: 31.5 g/dL (ref 30.0–36.0)
MCV: 86.9 fL (ref 80.0–100.0)
Platelets: 361 10*3/uL (ref 150–400)
RBC: 4.42 MIL/uL (ref 3.87–5.11)
RDW: 21 % — ABNORMAL HIGH (ref 11.5–15.5)
WBC: 32.8 10*3/uL — ABNORMAL HIGH (ref 4.0–10.5)
nRBC: 0.2 % (ref 0.0–0.2)

## 2019-05-17 MED ORDER — ENOXAPARIN SODIUM 30 MG/0.3ML ~~LOC~~ SOLN
30.0000 mg | SUBCUTANEOUS | Status: DC
  Administered 2019-05-17 – 2019-05-19 (×3): 30 mg via SUBCUTANEOUS
  Filled 2019-05-17 (×3): qty 0.3

## 2019-05-17 MED ORDER — AMOXICILLIN 500 MG PO CAPS
500.0000 mg | ORAL_CAPSULE | Freq: Two times a day (BID) | ORAL | Status: DC
  Administered 2019-05-17 – 2019-05-18 (×2): 500 mg via ORAL
  Filled 2019-05-17 (×3): qty 1

## 2019-05-17 NOTE — Progress Notes (Signed)
[facility] [place] NAME: Veronica Hubbard    MR#:  [id number]  DATE OF BIRTH:  08/10/1935  SUBJECTIVE:  CHIEF COMPLAINT:   Chief Complaint  Patient presents with  . Back Pain   Recent surgery on lumbar spinal fractures with vertebral fusion.  Came again with significant back pain and noted to have L1 and L2 compression fractures. Status post kyphoplasty 05/13/19. She had bowel movement in response to stool softeners and nausea  is resolved now.  her white blood cell count is high.  Her hip pain is resolved.  REVIEW OF SYSTEMS:  CONSTITUTIONAL: No fever, fatigue or weakness.  EYES: No blurred or double vision.  EARS, NOSE, AND THROAT: No tinnitus or ear pain.  RESPIRATORY: No cough, shortness of breath, wheezing or hemoptysis.  CARDIOVASCULAR: No chest pain, orthopnea, edema.  GASTROINTESTINAL: No nausea, vomiting, no diarrhea, no abdominal pain.  GENITOURINARY: No dysuria, hematuria.  ENDOCRINE: No polyuria, nocturia,  HEMATOLOGY: No anemia, easy bruising or bleeding SKIN: No rash or lesion. MUSCULOSKELETAL: No joint pain or arthritis.   NEUROLOGIC: No tingling, numbness, weakness.  PSYCHIATRY: No anxiety or depression.   ROS  DRUG ALLERGIES:   Allergies  Allergen Reactions  . Aspirin Shortness Of Breath  . Celecoxib Shortness Of Breath  . Iodinated Diagnostic Agents Other (See Comments)    Reaction:  Decreased kidney function  . Losartan Itching  . Morphine Nausea And Vomiting  . Verapamil Itching    VITALS:  Blood pressure 128/71, pulse 68, temperature 98.8 F (37.1 C), resp. rate 16, height 5\' 3"  (1.6 m), weight 59 kg, SpO2 95 %.  PHYSICAL EXAMINATION:  GENERAL:  84 y.o.-year-old patient lying in the bed with  acute distress- due to pain in back .  EYES: Pupils equal, round, reactive to light and accommodation. No scleral icterus. Extraocular muscles intact.  HEENT: Head atraumatic, normocephalic. Oropharynx and  nasopharynx clear.  NECK:  Supple, no jugular venous distention. No thyroid enlargement, no tenderness.  LUNGS: Normal breath sounds bilaterally, no wheezing, rales,rhonchi or crepitation. No use of accessory muscles of respiration.  CARDIOVASCULAR: S1, S2 normal. No murmurs, rubs, or gallops.  ABDOMEN: Soft, tender, nondistended. Bowel sounds present. No organomegaly or mass.  Tender in the vertebral area around lumbar region. EXTREMITIES: No pedal edema, cyanosis, or clubbing.  Tender in left hip area on local pressure. NEUROLOGIC: Cranial nerves II through XII are intact. Muscle strength 4/5 in all extremities. Sensation intact. Gait not checked.  PSYCHIATRIC: The patient is alert and oriented x 2.  SKIN: No obvious rash, lesion, or ulcer.   Physical Exam LABORATORY PANEL:   CBC Recent Labs  Lab 05/17/19 0422  WBC 32.8*  HGB 12.1  HCT 38.4  PLT 361   ------------------------------------------------------------------------------------------------------------------  Chemistries  Recent Labs  Lab 05/11/19 0654 05/12/19 0458  05/17/19 0422  NA 137 138   < > 135  K 3.6 4.0   < > 4.4  CL 103 102   < > 103  CO2 27 30   < > 22  GLUCOSE 96 87   < > 89  BUN 24* 21   < > 23  CREATININE 1.21* 1.14*   < > 1.41*  CALCIUM 10.6* 10.6*   < > 10.6*  MG  --  2.1  --   --   AST 17  --   --   --   ALT 6  --   --   --  ALKPHOS 75  --   --   --   BILITOT 0.6  --   --   --    < > = values in this interval not displayed.   ------------------------------------------------------------------------------------------------------------------  Cardiac Enzymes No results for input(s): TROPONINI in the last 168 hours. ------------------------------------------------------------------------------------------------------------------  RADIOLOGY:  Ct Abdomen Pelvis Wo Contrast  Result Date: 05/16/2019 CLINICAL DATA:  Nontraumatic compression fracture of lumbar spine. History of hypertension,  hypothyroidism chronic leukocytosis. Recent kyphoplasty for compression fracture. Now with worsening back pain. EXAM: CT ABDOMEN AND PELVIS WITHOUT CONTRAST TECHNIQUE: Multidetector CT imaging of the abdomen and pelvis was performed following the standard protocol without IV contrast. COMPARISON:  MRI lumbar spine dated 05/11/2019. CT abdomen dated 04/24/2019. FINDINGS: Lower chest: No acute abnormality. Hepatobiliary: No focal liver abnormality is seen. No gallstones, gallbladder wall thickening, or biliary dilatation. Pancreas: Unremarkable. No pancreatic ductal dilatation or surrounding inflammatory changes. Spleen: Splenomegaly. Adrenals/Urinary Tract: Adrenal glands are unremarkable. Bilateral renal cysts. 8 mm nonobstructing RIGHT renal stone. No ureteral or bladder calculi identified. Small amount of air within the bladder, presumably related to recent catheterization. Bladder otherwise unremarkable. Stomach/Bowel: No dilated large or small bowel loops. No evidence of bowel wall inflammation. Scattered diverticulosis of the sigmoid and descending colon but no focal inflammatory change to suggest acute diverticulitis. Moderate amount of stool throughout the nondistended colon. Stomach is unremarkable, partially decompressed. Vascular/Lymphatic: Fairly extensive atherosclerosis of the ectatic abdominal aorta and branch vessels. Peri renal abdominal aortic aneurysm measures 3.9 x 3.8 cm, only slightly increased in size compared to an earlier CT abdomen of 02/23/2016, stable compared to more recent studies. No periaortic fluid or inflammation. No enlarged lymph nodes seen in the abdomen or pelvis. Reproductive: Presumed hysterectomy. No adnexal mass or free fluid. Other: No free fluid or abscess collection. No free intraperitoneal air. Musculoskeletal: Interval kyphoplasties at the L1-L2 levels, without evidence of significant procedural complicating feature. Incidental note made of a small amount of  methylmethacrylate within ventral epidural veins at the L1 and L2 levels. No acute or suspicious osseous finding. IMPRESSION: 1. No acute intra-abdominal or intrapelvic findings. 2. Stable infrarenal abdominal aortic aneurysm, measuring 3.9 x 3.8 cm, only slightly increased in size compared to an earlier CT abdomen of 02/23/2016. 3. Diverticulosis without evidence of diverticulitis. 4. Small amount of air within the bladder, presumably related to recent catheterization. If there is concern for infection, recommend correlation with urinalysis. 5. Interval kyphoplasties at the L1-L2 levels, without evidence of significant procedural complicating feature. 6. 8 mm nonobstructing RIGHT renal stone. 7. Splenomegaly. This is not significantly changed compared to multiple prior exams. 8.  Aortic Atherosclerosis (ICD10-I70.0). Electronically Signed   By: Franki Cabot M.D.   On: 05/16/2019 14:46    ASSESSMENT AND PLAN:   Active Problems:   Compression fracture of lumbar spine, non-traumatic [facility])  Patient is an 84 year old female with a known history of hypertension, hypothyroidism, chronic leukocytosis secondary to history of MDS/MPN and was on Jakafi in the past and recent discharge from the hospital following kyphoplasty for L4 compression fracture who was subsequently discharged to skilled nursing facility and subsequently went home.  Presented back to the emergency room with complaints of worsening back pain.    1.  Back pain secondary to compression fracture. Patient recently discharged from the hospital following kyphoplasty for L4 compression fracture.  Was discharged to skilled nursing facility and subsequently home.  Back with worsening back pains. MRI of the lumbar spine revealed Patient had MRI of the  lumbar spine done today which revealed new acute minimal L1 superior endplate compression fracture withapproximately 10% height loss and trace retropulsion. Unchanged mild L3 and L4 compression  deformity status post Kyphoplasty.  No urinary incontinence or retention.  No fecal incontinence.  Patient reports ambulating with assistance at home prior to admission.  L1-L2 kyphoplasty done 05/13/19.  2.  Hypertension; uncontrolled Likely due to pains. Resumed home blood pressure meds.  Placed on PRN IV hydralazine with parameters.  3.  Hypothyroidism. Resume Synthroid.  TSH level was in therapeutic range.  4.  History of depression Stable.  Continue Celexa.  5.  History of asthma Stable.  6.  Abdominal pain complaint-x-ray KUB to rule out obstruction. Give stool softeners. Dulcolax suppository.  7. UTI- cx shows E fecalis   Amoxicilline  8.  Left hip pain get CT abdomen and pelvis. No active findings.  All the records are reviewed and case discussed with Care Management/Social Workerr. Management plans discussed with the patient, family and they are in agreement.  CODE STATUS: Full.  TOTAL TIME TAKING CARE OF THIS PATIENT: 35 minutes.    POSSIBLE D/C IN 1-2 DAYS, DEPENDING ON CLINICAL CONDITION.   Veronica Hubbard M.D on 05/17/2019   Between 7am to 6pm - Pager - [phone]  After 6pm go to [email or web address] - password [email or web address] [facility]  Office  [phone]  CC: Primary care physician; Zhou-Talbert, Elwyn Lade, MD  Note: This dictation was prepared with Dragon dictation along with smaller phrase technology. Any transcriptional errors that result from this process are unintentional.

## 2019-05-17 NOTE — Progress Notes (Signed)
Physical Therapy Treatment Patient Details Name: Veronica Hubbard MRN: [id number] DOB: [birth year] Today's Date: 05/17/2019    History of Present Illness presented to ER from home due to worsening back pain; admitted for management of L1-L2 compression fracture, s/p kyphoplasty 7/7.  Of note, previous L3-4 kyphoplasty 6/16; brief stay at [facility] upon discharge with return home.    PT Comments    Pt soundly sleeping; awoken through voice and agreeable to PT. Pt denies pain initially; however with some LE movement and attempted bed mobility, pt moans and yells out with back pain. Pt remains very lethargic throughout session requiring constant re awakening during session. Pt does follow instruction for exercises demonstrating small range with movement and tolerating some increased movement with assist; however, increased heel slide and SLR range causes pain. Pt educated on abdominal bracing and is able to demonstrate as an exercise with verbal and tactile cues; no carryover during LE exercises. Continue PT to progress participation, strength and functional mobility.    Follow Up Recommendations  SNF     Equipment Recommendations  Rolling walker with 5" wheels    Recommendations for Other Services       Precautions / Restrictions Precautions Precautions: Fall;Back Precaution Comments: s/p kyphoplasty Required Braces or Orthoses: Spinal Brace Spinal Brace: Lumbar corset(Not wearing currently; unable to find) Restrictions Weight Bearing Restrictions: Yes RLE Weight Bearing: Weight bearing as tolerated LLE Weight Bearing: Weight bearing as tolerated    Mobility  Bed Mobility               General bed mobility comments: Attempted; yells out with pain for repositioning  Transfers                    Ambulation/Gait                 Stairs             Wheelchair Mobility    Modified Rankin (Stroke Patients Only)       Balance                                            Cognition Arousal/Alertness: Lethargic Behavior During Therapy: WFL for tasks assessed/performed Overall Cognitive Status: Within Functional Limits for tasks assessed                                 General Comments: Continuous need to re awaken pt with session.       Exercises General Exercises - Lower Extremity Ankle Circles/Pumps: AROM;Both;20 reps Quad Sets: Strengthening;Both;10 reps Gluteal Sets: Strengthening;Both;10 reps Short Arc Quad: AROM;Both;10 reps Heel Slides: AROM;Both;10 reps(small range; with AA for more range, has pain) Hip ABduction/ADduction: AAROM;Both;10 reps Straight Leg Raises: AAROM;Both;10 reps;Other (comment)(limited range) Other Exercises Other Exercises: abdominal bracing 10x    General Comments        Pertinent Vitals/Pain Pain Assessment: Faces Faces Pain Scale: Hurts whole lot Pain Location: back Pain Descriptors / Indicators: Grimacing;Moaning(cries out with some movement in bed) Pain Intervention(s): Limited activity within patient's tolerance;Monitored during session    Home Living                      Prior Function            PT Goals (current goals can now  be found in the care plan section) Progress towards PT goals: Not progressing toward goals - comment    Frequency    7X/week      PT Plan Current plan remains appropriate    Co-evaluation              AM-PAC PT "6 Clicks" Mobility   Outcome Measure  Help needed turning from your back to your side while in a flat bed without using bedrails?: A Lot Help needed moving from lying on your back to sitting on the side of a flat bed without using bedrails?: A Lot Help needed moving to and from a bed to a chair (including a wheelchair)?: A Lot Help needed standing up from a chair using your arms (e.g., wheelchair or bedside chair)?: A Lot Help needed to walk in hospital room?: A Lot Help needed climbing  3-5 steps with a railing? : Total 6 Click Score: 11    End of Session   Activity Tolerance: Patient limited by lethargy;Patient limited by pain Patient left: in bed;with call bell/phone within reach;with bed alarm set   PT Visit Diagnosis: Muscle weakness (generalized) (M62.81);Difficulty in walking, not elsewhere classified (R26.2);Pain Pain - Right/Left: Left Pain - part of body: Leg     Time: 9166-0600 PT Time Calculation (min) (ACUTE ONLY): 23 min  Charges:  $Therapeutic Exercise: 23-37 mins                      Larae Grooms, PTA 05/17/2019, 11:33 AM

## 2019-05-17 NOTE — Progress Notes (Signed)
   Subjective: 4 Days Post-Op Procedure(s) (LRB): KYPHOPLASTY L1 L2 (N/A) Patient reports pain as mild.  Patient states a lot of pain yesterday afternoon but by last night no longer having any pain or discomfort.  She is currently doing well this morning states she seen much improvement from recent kyphoplasty.  PT notes yesterday showed patient with slow progress in moderate discomfort. Denies any CP, SOB.   Objective: Vital signs in last 24 hours: Temp:  [98.2 F (36.8 C)-98.3 F (36.8 C)] 98.3 F (36.8 C) (07/10 2216) Pulse Rate:  [58-67] 67 (07/10 2216) Resp:  [17-18] 18 (07/10 2216) BP: (137-154)/(66-74) 154/74 (07/10 2216) SpO2:  [96 %-98 %] 98 % (07/10 2216)  Intake/Output from previous day: 07/10 0701 - 07/11 0700 In: 300 [P.O.:150; I.V.:150] Out: -  Intake/Output this shift: No intake/output data recorded.  Recent Labs    05/16/19 0447 05/17/19 0422  HGB 12.7 12.1   Recent Labs    05/16/19 0447 05/17/19 0422  WBC 27.3* 32.8*  RBC 4.67 4.42  HCT 40.3 38.4  PLT 324 361   Recent Labs    05/16/19 0447 05/17/19 0422  NA 134* 135  K 4.2 4.4  CL 102 103  CO2 25 22  BUN 23 23  CREATININE 1.24* 1.41*  GLUCOSE 104* 89  CALCIUM 10.6* 10.6*   No results for input(s): LABPT, INR in the last 72 hours.  EXAM General - Patient is Alert, Appropriate and Oriented Lumbar spine: Lumbar spine shows no tenderness along spinous process.  No paravertebral muscle tenderness.   Sensation intact in lower extremities.  Past Medical History:  Diagnosis Date  . Asthma   . Cancer ([place]) 02/2018   bone marrow cancer  . Depression   . GERD (gastroesophageal reflux disease)   . Hypertension   . Hypothyroidism   . Personal history of chemotherapy   . Spinal stenosis     Assessment/Plan:   4 Days Post-Op Procedure(s) (LRB): KYPHOPLASTY L1 L2 (N/A) Active Problems:   Compression fracture of lumbar spine, non-traumatic (HCC)  Estimated body mass index is 23.03 kg/m  as calculated from the following:   Height as of this encounter: 5\' 3"  (1.6 m).   Weight as of this encounter: 59 kg. Advance diet Up with therapy, weightbearing as tolerated.  Patient seems to be doing very well this morning.  Moderate pain yesterday afternoon but last night she states her pain improved significantly.  She is doing very well this morning.  We will see how she progresses with PT.  Patient will need to follow-up with [facility] orthopedics in 2 weeks postop for x-rays of the lumbar spine.   Ronney Asters, PA-C [place] [facility] 05/17/2019, 8:09 AM

## 2019-05-17 NOTE — Progress Notes (Signed)
[facility] [name] update.

## 2019-05-17 NOTE — Consult Note (Signed)
[place]  [phone]) [phone] Fax:(336) [phone]  ID: Charolotte Capuchin OB: 11-14-1934  MR#: [id number]  FUX#:[id number]  Patient Care Team: Zhou-Talbert, Elwyn Lade, MD as PCP - General (Family Medicine)  CHIEF COMPLAINT: MDS  INTERVAL HISTORY: Patient is an 84 year old female postop day 4 of an L1/L2 kyphoplasty.  She continues to have increased pain, but otherwise feels well.  She was diagnosed with MDS in approximately March 2019 and had initial consultation of [employer].  She was placed on Jakifi, but this appears to have been discontinued approximately 1 year ago.  Patient reports she has not had follow-up since July 2019.  She has no neurologic complaints.  She denies any recent fevers or illnesses.  She has a fair appetite.  She denies any other pain.  She denies any chest pain, shortness of breath, cough, or hemoptysis.  She has no nausea, vomiting, constipation, or diarrhea.  She has no urinary complaints.  Patient otherwise feels well and offers no further specific complaints today.  REVIEW OF SYSTEMS:   Review of Systems  Constitutional: Positive for malaise/fatigue. Negative for fever and weight loss.  Respiratory: Negative.  Negative for cough, hemoptysis and shortness of breath.   Cardiovascular: Negative.  Negative for chest pain and leg swelling.  Gastrointestinal: Negative.  Negative for abdominal pain.  Genitourinary: Negative.  Negative for dysuria.  Musculoskeletal: Positive for back pain.  Skin: Negative.  Negative for rash.  Neurological: Positive for weakness. Negative for dizziness, focal weakness and headaches.  Endo/Heme/Allergies: Bruises/bleeds easily.  Psychiatric/Behavioral: Negative.  The patient is not nervous/anxious.     As per HPI. Otherwise, a complete review of systems is negative.  PAST MEDICAL HISTORY: Past Medical History:  Diagnosis Date   Asthma    Cancer ([place]) 02/2018   bone marrow cancer   Depression     GERD (gastroesophageal reflux disease)    Hypertension    Hypothyroidism    Personal history of chemotherapy    Spinal stenosis     PAST SURGICAL HISTORY: Past Surgical History:  Procedure Laterality Date   ABDOMINAL HYSTERECTOMY     BACK SURGERY     BREAST BIOPSY Left 2003   neg   cataracts     KYPHOPLASTY N/A 04/22/2019   Procedure: KYPHOPLASTY;  Surgeon: Hessie Knows, MD;  Location: [facility] ORS;  Service: Orthopedics;  Laterality: N/A;   KYPHOPLASTY N/A 04/10/2019   Procedure: KYPHOPLASTY L3;  Surgeon: Hessie Knows, MD;  Location: [facility];  Service: Orthopedics;  Laterality: N/A;   KYPHOPLASTY N/A 05/13/2019   Procedure: KYPHOPLASTY L1 L2;  Surgeon: Hessie Knows, MD;  Location: [facility] OR[facility];  Service: Orthopedics;  Laterality: N/A;   lipoma removal     SHOULDER SURGERY      FAMILY HISTORY: Family History  Problem Relation Age of Onset   Leukemia Mother    CAD Father    Diabetes Father    Breast cancer Sister 46    ADVANCED DIRECTIVES (Y/N):  @ADVDIR @  HEALTH MAINTENANCE: Social History   Tobacco Use   Smoking status: Former Smoker    Types: Cigarettes   Smokeless tobacco: Never Used  Substance Use Topics   Alcohol use: No   Drug use: No     Colonoscopy:  PAP:  Bone density:  Lipid panel:  Allergies  Allergen Reactions   Aspirin Shortness Of Breath   Celecoxib Shortness Of Breath   Iodinated Diagnostic Agents Other (See Comments)    Reaction:  Decreased kidney function   Losartan  Itching   Morphine Nausea And Vomiting   Verapamil Itching    Current Facility-Administered Medications  Medication Dose Route Frequency Provider Last Rate Last Dose   0.9 %  sodium chloride infusion   Intravenous PRN Hessie Knows, MD       0.9 %  sodium chloride infusion   Intravenous Continuous Vaughan Basta, MD 100 mL/hr at 05/17/19 0806     acetaminophen (TYLENOL) tablet 650 mg  650 mg Oral Q6H PRN Hessie Knows, MD   650 mg at  05/15/19 0617   allopurinol (ZYLOPRIM) tablet 200 mg  200 mg Oral Daily Hessie Knows, MD   200 mg at 05/16/19 1316   amoxicillin (AMOXIL) capsule 500 mg  500 mg Oral Q8H Vaughan Basta, MD   500 mg at 05/17/19 0537   bisacodyl (DULCOLAX) suppository 10 mg  10 mg Rectal Daily Vaughan Basta, MD   10 mg at 05/16/19 0844   budesonide (PULMICORT) nebulizer solution 0.5 mg  0.5 mg Nebulization BID Hessie Knows, MD   0.5 mg at 05/17/19 0756   ceFAZolin (ANCEF) IVPB 1 g/50 mL premix  1 g Intravenous Once Hessie Knows, MD       cholecalciferol (VITAMIN D) tablet 1,000 Units  1,000 Units Oral Daily Hessie Knows, MD   1,000 Units at 05/16/19 0844   citalopram (CELEXA) tablet 20 mg  20 mg Oral Daily Hessie Knows, MD   20 mg at 05/16/19 0845   cyclobenzaprine (FLEXERIL) tablet 5-10 mg  5-10 mg Oral QHS PRN Hessie Knows, MD   10 mg at 05/14/19 2147   enoxaparin (LOVENOX) injection 30 mg  30 mg Subcutaneous Q24H Hallaji, Sheema M, RPH   30 mg at 05/17/19 0820   ezetimibe (ZETIA) tablet 10 mg  10 mg Oral QHS Hessie Knows, MD   10 mg at 05/16/19 2126   feeding supplement (ENSURE ENLIVE) (ENSURE ENLIVE) liquid 237 mL  237 mL Oral BID BM Vaughan Basta, MD       fluticasone furoate-vilanterol (BREO ELLIPTA) 100-25 MCG/INH 1 puff  1 puff Inhalation Daily Hessie Knows, MD   1 puff at 05/17/19 0819   And   umeclidinium bromide (INCRUSE ELLIPTA) 62.5 MCG/INH 1 puff  1 puff Inhalation Daily Hessie Knows, MD   1 puff at 05/17/19 0819   hydrALAZINE (APRESOLINE) injection 10 mg  10 mg Intravenous Q6H PRN Hessie Knows, MD   10 mg at 05/14/19 1545   hydrALAZINE (APRESOLINE) tablet 50 mg  50 mg Oral Q8H Hessie Knows, MD   50 mg at 05/17/19 0537   HYDROmorphone (DILAUDID) injection 0.5 mg  0.5 mg Intravenous Q4H PRN Hessie Knows, MD       ipratropium-albuterol (DUONEB) 0.5-2.5 (3) MG/3ML nebulizer solution 3 mL  3 mL Nebulization QID PRN Hessie Knows, MD   3 mL at 05/16/19  0750   isosorbide mononitrate (IMDUR) 24 hr tablet 30 mg  30 mg Oral Daily Hessie Knows, MD   30 mg at 05/16/19 0844   levothyroxine (SYNTHROID) tablet 150 mcg  150 mcg Oral Q0600 Hessie Knows, MD   150 mcg at 05/17/19 0537   Melatonin TABS 5 mg  5 mg Oral QHS Hessie Knows, MD   5 mg at 05/16/19 2126   metoprolol tartrate (LOPRESSOR) tablet 25 mg  25 mg Oral BID Vaughan Basta, MD   25 mg at 05/16/19 2126   ondansetron (ZOFRAN) injection 4 mg  4 mg Intravenous Q6H PRN Vaughan Basta, MD   4 mg at 05/16/19 1417  oxyCODONE-acetaminophen (PERCOCET) 7.5-325 MG per tablet 1-2 tablet  1-2 tablet Oral Q6H PRN Duanne Guess, PA-C   2 tablet at 05/17/19 0030   pantoprazole (PROTONIX) EC tablet 40 mg  40 mg Oral BID Hessie Knows, MD   40 mg at 05/16/19 2126   polyethylene glycol (MIRALAX / GLYCOLAX) packet 17 g  17 g Oral Daily PRN Hessie Knows, MD   17 g at 05/15/19 1343   potassium chloride SA (K-DUR) CR tablet 20 mEq  20 mEq Oral BID Vaughan Basta, MD   20 mEq at 05/16/19 2126   senna-docusate (Senokot-S) tablet 1 tablet  1 tablet Oral BID Hessie Knows, MD   1 tablet at 05/16/19 2126   sertraline (ZOLOFT) tablet 25 mg  25 mg Oral Daily Vaughan Basta, MD   25 mg at 05/16/19 2135   triamcinolone cream (KENALOG) 0.5 % 1 application  1 application Topical TID Hessie Knows, MD   1 application at 64/33/29 2128    OBJECTIVE: Vitals:   05/16/19 2216 05/17/19 0812  BP: (!) 154/74 128/71  Pulse: 67 68  Resp: 18 16  Temp: 98.3 F (36.8 C) 98.8 F (37.1 C)  SpO2: 98% 95%     Body mass index is 23.03 kg/m.    ECOG FS:2 - Symptomatic, <50% confined to bed  General: Well-developed, well-nourished, no acute distress. Eyes: Pink conjunctiva, anicteric sclera. HEENT: Normocephalic, moist mucous membranes, clear oropharnyx. Lungs: Clear to auscultation bilaterally. Heart: Regular rate and rhythm. No rubs, murmurs, or gallops. Abdomen: Soft, nontender,  nondistended. No organomegaly noted, normoactive bowel sounds. Musculoskeletal: No edema, cyanosis, or clubbing. Neuro: Alert, answering all questions appropriately. Cranial nerves grossly intact. Skin: No rashes or petechiae noted. Psych: Normal affect. Lymphatics: No cervical, calvicular, axillary or inguinal LAD.   LAB RESULTS:  Lab Results  Component Value Date   NA 135 05/17/2019   K 4.4 05/17/2019   CL 103 05/17/2019   CO2 22 05/17/2019   GLUCOSE 89 05/17/2019   BUN 23 05/17/2019   CREATININE 1.41 (H) 05/17/2019   CALCIUM 10.6 (H) 05/17/2019   PROT 6.6 05/11/2019   ALBUMIN 3.8 05/11/2019   AST 17 05/11/2019   ALT 6 05/11/2019   ALKPHOS 75 05/11/2019   BILITOT 0.6 05/11/2019   GFRNONAA 35 (L) 05/17/2019   GFRAA 40 (L) 05/17/2019    Lab Results  Component Value Date   WBC 32.8 (H) 05/17/2019   NEUTROABS 17.8 (H) 05/11/2019   HGB 12.1 05/17/2019   HCT 38.4 05/17/2019   MCV 86.9 05/17/2019   PLT 361 05/17/2019     STUDIES: Ct Abdomen Pelvis Wo Contrast  Result Date: 05/16/2019 CLINICAL DATA:  Nontraumatic compression fracture of lumbar spine. History of hypertension, hypothyroidism chronic leukocytosis. Recent kyphoplasty for compression fracture. Now with worsening back pain. EXAM: CT ABDOMEN AND PELVIS WITHOUT CONTRAST TECHNIQUE: Multidetector CT imaging of the abdomen and pelvis was performed following the standard protocol without IV contrast. COMPARISON:  MRI lumbar spine dated 05/11/2019. CT abdomen dated 04/24/2019. FINDINGS: Lower chest: No acute abnormality. Hepatobiliary: No focal liver abnormality is seen. No gallstones, gallbladder wall thickening, or biliary dilatation. Pancreas: Unremarkable. No pancreatic ductal dilatation or surrounding inflammatory changes. Spleen: Splenomegaly. Adrenals/Urinary Tract: Adrenal glands are unremarkable. Bilateral renal cysts. 8 mm nonobstructing RIGHT renal stone. No ureteral or bladder calculi identified. Small amount of  air within the bladder, presumably related to recent catheterization. Bladder otherwise unremarkable. Stomach/Bowel: No dilated large or small bowel loops. No evidence of bowel wall inflammation. Scattered  diverticulosis of the sigmoid and descending colon but no focal inflammatory change to suggest acute diverticulitis. Moderate amount of stool throughout the nondistended colon. Stomach is unremarkable, partially decompressed. Vascular/Lymphatic: Fairly extensive atherosclerosis of the ectatic abdominal aorta and branch vessels. Peri renal abdominal aortic aneurysm measures 3.9 x 3.8 cm, only slightly increased in size compared to an earlier CT abdomen of 02/23/2016, stable compared to more recent studies. No periaortic fluid or inflammation. No enlarged lymph nodes seen in the abdomen or pelvis. Reproductive: Presumed hysterectomy. No adnexal mass or free fluid. Other: No free fluid or abscess collection. No free intraperitoneal air. Musculoskeletal: Interval kyphoplasties at the L1-L2 levels, without evidence of significant procedural complicating feature. Incidental note made of a small amount of methylmethacrylate within ventral epidural veins at the L1 and L2 levels. No acute or suspicious osseous finding. IMPRESSION: 1. No acute intra-abdominal or intrapelvic findings. 2. Stable infrarenal abdominal aortic aneurysm, measuring 3.9 x 3.8 cm, only slightly increased in size compared to an earlier CT abdomen of 02/23/2016. 3. Diverticulosis without evidence of diverticulitis. 4. Small amount of air within the bladder, presumably related to recent catheterization. If there is concern for infection, recommend correlation with urinalysis. 5. Interval kyphoplasties at the L1-L2 levels, without evidence of significant procedural complicating feature. 6. 8 mm nonobstructing RIGHT renal stone. 7. Splenomegaly. This is not significantly changed compared to multiple prior exams. 8.  Aortic Atherosclerosis (ICD10-I70.0).  Electronically Signed   By: Franki Cabot M.D.   On: 05/16/2019 14:46   Ct Abdomen Pelvis Wo Contrast  Result Date: 04/24/2019 CLINICAL DATA:  84 year old female with acute abdominal and pelvic pain. EXAM: CT ABDOMEN AND PELVIS WITHOUT CONTRAST TECHNIQUE: Multidetector CT imaging of the abdomen and pelvis was performed following the standard protocol without IV contrast. COMPARISON:  04/19/2019 and prior CTs FINDINGS: Please note that parenchymal abnormalities may be missed without intravenous contrast. Lower chest: Mild RIGHT basilar atelectasis noted. Hepatobiliary: The liver and gallbladder are unremarkable. No biliary dilatation. Pancreas: Unremarkable Spleen: Splenomegaly again noted. Adrenals/Urinary Tract: Nonobstructing bilateral renal calculi again noted, the largest measuring 8 mm in RIGHT UPPER pole. Probable RIGHT renal cysts again noted. The adrenal glands and bladder are unremarkable. Stomach/Bowel: Stomach is within normal limits. No evidence of bowel wall thickening, distention, or inflammatory changes. Vascular/Lymphatic: A unchanged 3.9 cm infrarenal abdominal aortic aneurysm is again noted. Aortic and branch vessel atherosclerotic calcifications are noted. No enlarged lymph nodes identified. Reproductive: Status post hysterectomy. No adnexal masses. Other: No focal collection, ascites or pneumoperitoneum. Musculoskeletal: L4 vertebral augmentation changes now noted. L3 vertebral augmentation changes are again identified. No acute fracture identified. No suspicious bony abnormalities are present. Degenerative changes in the lumbar spine and hips again identified. IMPRESSION: 1. No acute abnormality within the abdomen or pelvis. 2. Unchanged 3.9 cm abdominal aortic aneurysm. Recommend followup by ultrasound in 2 years. This recommendation follows ACR consensus guidelines: White Paper of the ACR Incidental Findings Committee II on Vascular Findings. J Am Coll Radiol 2013; 10:789-794. Aortic  aneurysm NOS (ICD10-I71.9) 3. L3 and L4 vertebral augmentation changes. No definite acute bony abnormality. 4. Unchanged splenomegaly 5. Mild RIGHT basilar atelectasis 6.  Aortic Atherosclerosis (ICD10-I70.0). Electronically Signed   By: Margarette Canada M.D.   On: 04/24/2019 11:19   Dg Lumbar Spine 2-3 Views  Result Date: 05/13/2019 CLINICAL DATA:  L1 and L2 kyphoplasty. EXAM: LUMBAR SPINE - 2-3 VIEW COMPARISON:  05/11/2019 FINDINGS: C-arm images show vertebral augmentation at the L1 and L2 levels. No complicating features on  these limited images. IMPRESSION: L1 and L2 vertebral augmentation. Electronically Signed   By: Nelson Chimes M.D.   On: 05/13/2019 14:14   Dg Lumbar Spine 2-3 Views  Result Date: 05/11/2019 CLINICAL DATA:  Unresolved low back pain. Unable to walk or stand. Kyphoplasty 04/22/2019 EXAM: LUMBAR SPINE - 2-3 VIEW COMPARISON:  05/02/2019.  CT 04/24/2019.  MRI 04/19/2019 FINDINGS: Previously augmented fractures at L3 and L4 as seen previously. No evidence of progression at those 2 levels. Chronic anterolisthesis at L3-4 of 7 mm. I think there is a new fracture at L1 with loss of height of about 10%. No other change. IMPRESSION: Suspicion of a new fracture at L1 with loss of height of about 10%. Electronically Signed   By: Nelson Chimes M.D.   On: 05/11/2019 07:55   Dg Lumbar Spine 2-3 Views  Result Date: 05/02/2019 CLINICAL DATA:  Chronic low back pain. No recent injury. History of prior kyphoplasty. EXAM: LUMBAR SPINE - 2-3 VIEW COMPARISON:  April 19, 2019 FINDINGS: The patient is status post L3 and L4 kyphoplasty. There is no significant interval height loss of either vertebral body. There are multilevel degenerative changes throughout the visualized lumbar spine. There is osteopenia. Again noted is an abdominal aortic aneurysm with heavy calcifications throughout the abdominal aorta. There is grade 1 anterolisthesis of L3 on L4 and L4 on L5. there is a probable stone in the lower pole the right  kidney. IMPRESSION: 1. No new acute displaced fracture. If there is high clinical suspicion for an occult fracture follow-up with MRI is recommended. 2. Status post kyphoplasty at the L3 and L4 levels. No additional further height loss of either vertebral body is noted. 3. Multilevel degenerative changes of the lumbar spine as detailed above. 4. Abdominal aortic aneurysm better visualized on prior CT. Electronically Signed   By: Constance Holster M.D.   On: 05/02/2019 19:01   Dg Lumbar Spine 2-3 Views  Result Date: 04/22/2019 CLINICAL DATA:  Vertebral augmentation EXAM: DG C-ARM 61-120 MIN; LUMBAR SPINE - 2-3 VIEW COMPARISON:  None. FINDINGS: Four fluoroscopic images are providing showing vertebral augmentation at the lumbar spine. IMPRESSION: Intraoperative fluoroscopy Electronically Signed   By: Ulyses Jarred M.D.   On: 04/22/2019 19:39   Dg Lumbar Spine 2-3 Views  Result Date: 04/19/2019 CLINICAL DATA:  Pain status post fall. EXAM: LUMBAR SPINE - 2-3 VIEW COMPARISON:  None. FINDINGS: The patient has undergone L3 kyphoplasty. Aortic calcifications are noted. There is a probable abdominal aortic aneurysm measuring approximately 4.5 cm. There is endplate sclerosis involving the L4 vertebral body. There is some sclerosis involving the inferior endplate of the L 2 vertebral body which is favored to be degenerative in etiology. There is grade 1 anterolisthesis of L3 on L4, likely degenerative. There are multilevel degenerative changes throughout the lumbar spine including advanced facet arthrosis at the lower lumbar segments. IMPRESSION: 1. There is mild endplate sclerosis involving the superior endplate of the L4 vertebral body. This is age indeterminate. Correlation with point tenderness and physical exam is recommended. If there is clinical concern for an acute fracture, follow-up with cross-sectional imaging is recommended. 2. Status post L3 kyphoplasty. 3. Probable abdominal aortic aneurysm measuring 4.5  cm. Follow-up with a nonemergent outpatient ultrasound is recommended for further evaluation. 4. Advanced degenerative changes throughout the lumbar spine. Electronically Signed   By: Constance Holster M.D.   On: 04/19/2019 01:32   Dg Abd 1 View  Result Date: 05/14/2019 CLINICAL DATA:  84 year old female with nausea  and vomiting. EXAM: ABDOMEN - 1 VIEW COMPARISON:  Abdominal radiograph dated 05/13/2019 FINDINGS: There is moderate amount of stool throughout the colon. No bowel dilatation or evidence of obstruction. No free air. Right renal inferior pole calculus again noted. There is osteopenia with multilevel vertebroplasty changes. Interval vertebroplasty of L1 and L2 since the prior radiograph. Bilateral hip osteoarthritic changes. No acute osseous pathology. Atherosclerotic calcification of the aorta. IMPRESSION: 1. Constipation. No bowel obstruction. 2. Right renal calculus. Electronically Signed   By: Anner Crete M.D.   On: 05/14/2019 12:42   Dg Abd 1 View  Result Date: 05/13/2019 CLINICAL DATA:  Abdominal pain EXAM: ABDOMEN - 1 VIEW COMPARISON:  04/26/2019 FINDINGS: There is a moderate amount of stool throughout the colon. There is no bowel dilatation to suggest obstruction. There is no evidence of pneumoperitoneum, portal venous gas or pneumatosis. There are no pathologic calcifications along the expected course of the ureters. There is a right lower pole renal calculus. There is prior vertebral body augmentation at L3 and L4. Moderate-severe osteoarthritis of bilateral hips. IMPRESSION: No evidence of bowel obstruction. Moderate amount of stool throughout the colon. Electronically Signed   By: Kathreen Devoid   On: 05/13/2019 11:02   Dg Abd 1 View  Result Date: 04/26/2019 CLINICAL DATA:  Kyphoplasty performed 04/22/2019 now with worsening back pain inability to void. EXAM: ABDOMEN - 1 VIEW COMPARISON:  CT abdomen pelvis-04/24/2019; intraoperative images during kyphoplasty-04/22/2019 FINDINGS:  Paucity of bowel gas without evidence of enteric obstruction. No pneumoperitoneum, pneumatosis or portal venous gas. The approximately 0.9 cm calcification overlying the right mid hemiabdomen correlates with the known nonobstructing right-sided renal stone demonstrated on recent abdominal CT. Punctate phleboliths overlie the left hemipelvis. Post L3 and L4 cement augmentation, incompletely evaluated. Degenerative change of the lower lumbar spine is suspected. Moderate degenerative change the bilateral hips is suspected. IMPRESSION: 1. Paucity of bowel gas without evidence of enteric obstruction. 2. Post L3 and L4 cement augmentation, incompletely evaluated. 3. Right-sided nephrolithiasis as demonstrated on recent abdominal CT. Electronically Signed   By: Sandi Mariscal M.D.   On: 04/26/2019 17:31   Mr Lumbar Spine Wo Contrast  Result Date: 05/11/2019 CLINICAL DATA:  Recent L3 and L4 kyphoplasties. Continued back pain. Suspected new L1 compression fracture on x-ray. EXAM: MRI LUMBAR SPINE WITHOUT CONTRAST TECHNIQUE: Multiplanar, multisequence MR imaging of the lumbar spine was performed. No intravenous contrast was administered. COMPARISON:  Lumbar spine x-rays from same day. MRI lumbar spine dated April 19, 2019. FINDINGS: Segmentation:  Standard. Alignment: Unchanged 6 mm anterolisthesis at L3-L4 and 5 mm anterolisthesis at L4-L5. Vertebrae: New minimal L1 superior endplate compression fracture with approximately 10% height loss and trace retropulsion. Unchanged mild L3 and L4 compression deformity status post cement augmentation. No evidence of discitis or suspicious bone lesion. Similar degenerative endplate marrow edema at L2-L3. Conus medullaris and cauda equina: Conus extends to the L2 level. Conus and cauda equina appear normal. Paraspinal and other soft tissues: Unchanged bilateral renal cysts. Unchanged 3.9 cm infrarenal abdominal aortic aneurysm. Disc levels: T12-L1: Negative disc. Unchanged mild bilateral  facet arthropathy. No stenosis. L1-L2: Negative disc. Unchanged mild bilateral facet arthropathy. No stenosis. L2-L3: Unchanged disc bulging, endplate spurring, and moderate to severe bilateral facet arthropathy. Unchanged moderate spinal canal stenosis. Unchanged severe left and moderate right lateral recess stenosis. Unchanged moderate bilateral neuroforaminal stenosis. L3-L4: Prior bilateral laminectomies. Unchanged disc uncovering without significant bulge or herniation. Unchanged severe bilateral facet arthropathy. Unchanged moderate spinal canal and left greater than right  lateral recess stenosis. Unchanged mild right neuroforaminal stenosis. No left neuroforaminal stenosis. L4-L5: Unchanged disc uncovering with right-sided disc osteophyte complex. Unchanged moderate bilateral facet arthropathy. Unchanged mild right greater than left lateral recess stenosis. No spinal canal or neuroforaminal stenosis. L5-S1: Negative disc. Unchanged mild bilateral facet arthropathy. Unchanged mild right neuroforaminal stenosis due to endplate spurring. No spinal canal or left neuroforaminal stenosis. IMPRESSION: 1. New acute minimal L1 superior endplate compression fracture with approximately 10% height loss and trace retropulsion. 2. Unchanged mild L3 and L4 compression deformity status post kyphoplasty. 3. Similar appearing multilevel spondylosis as described above with moderate stenosis at L2-L3 and L3-L4. Electronically Signed   By: Titus Dubin M.D.   On: 05/11/2019 10:29   Mr Lumbar Spine Wo Contrast  Result Date: 04/19/2019 CLINICAL DATA:  Back pain.  Kyphoplasty at L3 on 04/10/2019. EXAM: MRI LUMBAR SPINE WITHOUT CONTRAST TECHNIQUE: Multiplanar, multisequence MR imaging of the lumbar spine was performed. No intravenous contrast was administered. COMPARISON:  04/19/2019 CT scan FINDINGS: Segmentation: The lowest lumbar type non-rib-bearing vertebra is labeled as L5. Alignment: 6 mm anterolisthesis L3 on L4  associated with a chronic right L3 pars defect. 5 mm degenerative anterolisthesis at L4-5. Vertebrae: Oblique fracture plane in the L3 vertebral body as seen on the CT scan, with methacrylate partially tracking along the transverse fracture plane. Overall there is about 15% loss of height of the L3 vertebral body. Type 2 degenerative endplate findings along the inferior endplate of L2. There is an oblique band of edema extending in the right anterior L4 vertebral body the probably related to a right anterior superior compression of the endplate. Disc desiccation throughout the lumbar spine with loss of disc height especially at L5-S1. Conus medullaris and cauda equina: Conus extends to the L1-2 level. Conus and cauda equina appear normal. Paraspinal and other soft tissues: Numerous bilateral renal cysts of varying complexity are incompletely assessed on today's exam. Some of these have low T2 and high T1 signal characteristics. There is paravertebral edema at L3 and L4, right greater than left. This is best appreciated on the sagittal inversion recovery weighted images. Disc levels: L1-2: Unremarkable. L2-3: Moderate to prominent left and moderate right foraminal stenosis and moderate central narrowing of the thecal sac due to facet and intervertebral spurring along with diffuse disc bulge. Suspected prior past laminectomies. L3-4: Moderate central narrowing of the thecal sac related to disc uncovering and facet arthropathy. Prior laminectomies. L4-5: Borderline bilateral subarticular lateral recess stenosis due to facet arthropathy and subluxation. L5-S1: No impingement. Intervertebral spurring extends inferiorly in the right neural foramen. IMPRESSION: 1. Primarily transverse L3 fracture, with some partial filling in the fracture plane with methacrylate and about 15% loss of height. This is roughly stable compared to 04/19/2019 CT scan. 2. There is superior endplate concavity and edema eccentric to the right in  the L4 vertebral body suggesting a mild superior endplate compression fracture. 3. Continued anterolisthesis at L3 and L4; at L3 a chronic right pars defect is likely contributory. 4. Lumbar spondylosis and degenerative disc disease cause moderate impingement at L2-3 and L3-4, as noted above. 5. Postoperative findings including laminectomies at L2-3 and L3-4. Electronically Signed   By: Van Clines M.D.   On: 04/19/2019 15:15   Ct L-spine No Charge  Result Date: 04/19/2019 CLINICAL DATA:  Recent fall with fracture and kyphoplasty performed 04/10/2019. Increasing back pain. EXAM: CT LUMBAR SPINE WITHOUT CONTRAST TECHNIQUE: Multidetector CT imaging of the lumbar spine was performed without intravenous contrast administration.  Multiplanar CT image reconstructions were also generated. COMPARISON:  Abdominal CT performed today. Plain films 04/10/2019. CT 02/23/2016 FINDINGS: Segmentation: 5 lumbar type vertebral bodies. Alignment: Slight anterolisthesis at L3-4 related to facet disease. Vertebrae: Fracture noted through the L3 vertebral body with fracture lines remain evident. Vertebral augmentation changes noted in the L3 vertebral body. There is superior endplate cortical irregularity at L4, best seen along the anterior superior corner. No significant loss in vertebral body height. Paraspinal and other soft tissues: Negative. Disc levels: Diffuse degenerative disc disease and facet disease. IMPRESSION: L3 vertebral body fracture status post vertebral augmentation. Fracture lines remain evident. Fracture through the superior endplate of L4, best seen along the anterior superior corner. No loss in vertebral body height. Diffuse degenerative disc and facet disease. Electronically Signed   By: Rolm Baptise M.D.   On: 04/19/2019 03:50   Dg Chest Port 1 View  Result Date: 05/02/2019 CLINICAL DATA:  Shortness of breath EXAM: PORTABLE CHEST 1 VIEW COMPARISON:  April 22, 2019 chest radiograph and chest CT June 07, 2018 FINDINGS: There is no edema or consolidation. There is mild right base atelectasis. Heart is upper normal in size with pulmonary vascularity normal. Aneurysmal dilatation in the descending aorta appears stable by radiography. There is aortic atherosclerotic calcification. No adenopathy. No bone lesions. IMPRESSION: Aortic atherosclerosis with apparent descending thoracic aortic aneurysmal dilatation, grossly stable by radiography. No edema or consolidation. Mild right base atelectasis noted. Stable cardiac silhouette. Electronically Signed   By: Lowella Grip III M.D.   On: 05/02/2019 14:21   Dg Chest Port 1 View  Result Date: 04/22/2019 CLINICAL DATA:  Shortness of breath. EXAM: PORTABLE CHEST 1 VIEW COMPARISON:  Chest x-ray dated 02/23/2016 and chest CT dated 06/07/2018 and CT scan of the abdomen dated 04/19/2019 FINDINGS: There is tortuosity and aneurysmal dilatation of the descending thoracic aorta as demonstrated on multiple prior exams. Overall heart size is normal. Slight linear atelectasis at the right lung base laterally. No infiltrates. No acute bone abnormality. Aortic atherosclerosis. IMPRESSION: 1. Slight atelectasis at the right lung base. This is essentially unchanged since the prior CT scan of 04/19/2019. 2. Chronic aneurysmal dilatation of descending thoracic aorta. 3.  Aortic Atherosclerosis (ICD10-I70.0). Electronically Signed   By: Lorriane Shire M.D.   On: 04/22/2019 12:47   Dg C-arm 1-60 Min  Result Date: 05/13/2019 CLINICAL DATA:  L1 and L2 kyphoplasty. EXAM: LUMBAR SPINE - 2-3 VIEW COMPARISON:  05/11/2019 FINDINGS: C-arm images show vertebral augmentation at the L1 and L2 levels. No complicating features on these limited images. IMPRESSION: L1 and L2 vertebral augmentation. Electronically Signed   By: Nelson Chimes M.D.   On: 05/13/2019 14:14   Dg C-arm 1-60 Min  Result Date: 04/22/2019 CLINICAL DATA:  Vertebral augmentation EXAM: DG C-ARM 61-120 MIN; LUMBAR SPINE - 2-3  VIEW COMPARISON:  None. FINDINGS: Four fluoroscopic images are providing showing vertebral augmentation at the lumbar spine. IMPRESSION: Intraoperative fluoroscopy Electronically Signed   By: Ulyses Jarred M.D.   On: 04/22/2019 19:39   Ct Renal Stone Study  Result Date: 04/19/2019 CLINICAL DATA:  Fall last night. L4 fracture. Increasing back pain. EXAM: CT ABDOMEN AND PELVIS WITHOUT CONTRAST TECHNIQUE: Multidetector CT imaging of the abdomen and pelvis was performed following the standard protocol without IV contrast. COMPARISON:  Plain film 04/19/2019 and 04/10/2019.  CT 02/23/2016. FINDINGS: Lower chest: Mild cardiomegaly. Aortic atherosclerosis. No acute abnormality. Hepatobiliary: No focal hepatic abnormality. Gallbladder unremarkable. Pancreas: No focal abnormality or ductal dilatation. Spleen: Splenomegaly  with a craniocaudal length of the spleen measuring 18 cm. This is stable when compared to prior CT from 2017. Adrenals/Urinary Tract: Multiple right renal cysts. 6 mm nonobstructing stone in the lower pole of the right kidney. Renovascular calcifications. Punctate nonobstructing stone in the midpole of the left kidney. No hydronephrosis. Urinary bladder and adrenal glands unremarkable. Stomach/Bowel: Stomach, large and small bowel grossly unremarkable. Vascular/Lymphatic: Aortic atherosclerosis. 3.9 cm abdominal aortic aneurysm. No adenopathy. Reproductive: Prior hysterectomy.  No adnexal masses. Other: No free fluid or free air. Musculoskeletal: Fracture involving the L3 vertebral body with changes of vertebroplasty. Fracture lines remain evident. Probable fracture through the superior endplate of L4 without loss of vertebral body height. Degenerative facet disease throughout the lumbar spine. Slight anterolisthesis at L3-4, stable. IMPRESSION: L3 fracture, status post vertebroplasty. Fracture lines remain evident. Slight irregularity along the superior endplate of L4 may reflect mild fracture without  significant loss of vertebral body height. No retropulsed fracture fragments. Bilateral nephrolithiasis.  No hydronephrosis. Splenomegaly, stable since 2017. Diffuse aortoiliac atherosclerosis. 3.9 cm abdominal aortic aneurysm. Recommend followup by ultrasound in 2 years. This recommendation follows ACR consensus guidelines: White Paper of the ACR Incidental Findings Committee II on Vascular Findings. J Am Coll Radiol 2013; 10:789-794. Aortic aneurysm NOS (ICD10-I71.9) Electronically Signed   By: Rolm Baptise M.D.   On: 04/19/2019 03:47    ASSESSMENT: MDS  PLAN:    1.  MDS: On January 10, 2018 peripheral blood was positive for JAK-2 V617F mutation.  FISH panel was reported as negative.  Peripheral blood flow cytometry in April 2019 was suggestive of a myeloproliferative disorder.  Patient subsequently had a bone marrow biopsy on February 15, 2018 that was reported as hypercellular with atypical megakaryocytes, approximately 20% ring sideroblasts without increased blasts.  Cytogenetics revealed a normal female karyotype NGS panel was significant for 20 Jak 2 mutation as well as SF3B1 mutation.  She was initially started on hydroxyurea with improvement of her white blood cell count, but that this was subsequently discontinued.  She was transitioned to [place] 5 mg twice daily in June 2019, but was tapered off approximately 1 month later.  Patient has not been on treatment since.  CBC reveals persistent leukocytosis, but otherwise within normal limits.  Patient does not require treatment for her MDS at this time.  Patient reports she has follow-up with a hematologist in [place], [name].  She lives in [place], [place].  If she requires hematology/oncology follow-up within [facility] she can see Dr. Mike Gip in the [facility]. 2.  Kyphoplasty: Continue monitoring and treatment per orthopedics.  Appreciate consult, call with questions.   Lloyd Huger, MD   05/17/2019 9:01 AM

## 2019-05-17 NOTE — Progress Notes (Signed)
PHARMACIST - PHYSICIAN COMMUNICATION  CONCERNING:  Enoxaparin (Lovenox) for DVT Prophylaxis   RECOMMENDATION: Patient was prescribed enoxaprin 40mg  q24 hours for VTE prophylaxis.   Filed Weights   05/11/19 0652 05/13/19 1247  Weight: 130 lb (59 kg) 130 lb (59 kg)    Body mass index is 23.03 kg/m.  Estimated Creatinine Clearance: 25.4 mL/min (A) (by C-G formula based on SCr of 1.41 mg/dL (H)).  Patient is candidate for enoxaparin 30mg  every 24 hours based on CrCl <67ml/min   DESCRIPTION: Pharmacy has adjusted enoxaparin dose per [facility]  policy.  Patient is now receiving enoxaparin 30mg  every 24 hours.  Pernell Dupre, PharmD, BCPS Clinical Pharmacist 05/17/2019 6:58 AM

## 2019-05-18 LAB — CBC
HCT: 34.4 % — ABNORMAL LOW (ref 36.0–46.0)
Hemoglobin: 11 g/dL — ABNORMAL LOW (ref 12.0–15.0)
MCH: 27.3 pg (ref 26.0–34.0)
MCHC: 32 g/dL (ref 30.0–36.0)
MCV: 85.4 fL (ref 80.0–100.0)
Platelets: 315 10*3/uL (ref 150–400)
RBC: 4.03 MIL/uL (ref 3.87–5.11)
RDW: 20.5 % — ABNORMAL HIGH (ref 11.5–15.5)
WBC: 23.4 10*3/uL — ABNORMAL HIGH (ref 4.0–10.5)
nRBC: 0.2 % (ref 0.0–0.2)

## 2019-05-18 LAB — BASIC METABOLIC PANEL
Anion gap: 6 (ref 5–15)
BUN: 16 mg/dL (ref 8–23)
CO2: 22 mmol/L (ref 22–32)
Calcium: 10.1 mg/dL (ref 8.9–10.3)
Chloride: 108 mmol/L (ref 98–111)
Creatinine, Ser: 1.15 mg/dL — ABNORMAL HIGH (ref 0.44–1.00)
GFR calc Af Amer: 51 mL/min — ABNORMAL LOW (ref >60)
GFR calc non Af Amer: 44 mL/min — ABNORMAL LOW (ref >60)
Glucose, Bld: 100 mg/dL — ABNORMAL HIGH (ref 70–99)
Potassium: 4 mmol/L (ref 3.5–5.1)
Sodium: 136 mmol/L (ref 135–145)

## 2019-05-18 MED ORDER — AMOXICILLIN 500 MG PO CAPS
500.0000 mg | ORAL_CAPSULE | Freq: Three times a day (TID) | ORAL | Status: DC
  Administered 2019-05-18 – 2019-05-19 (×2): 500 mg via ORAL
  Filled 2019-05-18 (×4): qty 1

## 2019-05-18 NOTE — Progress Notes (Signed)
[place] [facility] [place] NAME: Veronica Hubbard    MR#:  [id number]  DATE OF BIRTH:  1935/07/27  SUBJECTIVE:  CHIEF COMPLAINT:   Chief Complaint  Patient presents with  . Back Pain   Recent surgery on lumbar spinal fractures with vertebral fusion.  Came again with significant back pain and noted to have L1 and L2 compression fractures. Status post kyphoplasty 05/13/19. She had bowel movement in response to stool softeners and nausea  is resolved now.  her white blood cell count is high.  Her hip pain is resolved.  REVIEW OF SYSTEMS:  CONSTITUTIONAL: No fever, fatigue or weakness.  EYES: No blurred or double vision.  EARS, NOSE, AND THROAT: No tinnitus or ear pain.  RESPIRATORY: No cough, shortness of breath, wheezing or hemoptysis.  CARDIOVASCULAR: No chest pain, orthopnea, edema.  GASTROINTESTINAL: No nausea, vomiting, no diarrhea, no abdominal pain.  GENITOURINARY: No dysuria, hematuria.  ENDOCRINE: No polyuria, nocturia,  HEMATOLOGY: No anemia, easy bruising or bleeding SKIN: No rash or lesion. MUSCULOSKELETAL: No joint pain or arthritis.   NEUROLOGIC: No tingling, numbness, weakness.  PSYCHIATRY: No anxiety or depression.   ROS  DRUG ALLERGIES:   Allergies  Allergen Reactions  . Aspirin Shortness Of Breath  . Celecoxib Shortness Of Breath  . Iodinated Diagnostic Agents Other (See Comments)    Reaction:  Decreased kidney function  . Losartan Itching  . Morphine Nausea And Vomiting  . Verapamil Itching    VITALS:  Blood pressure (!) 138/55, pulse 60, temperature 98.4 F (36.9 C), temperature source Oral, resp. rate 18, height 5\' 3"  (1.6 m), weight 59 kg, SpO2 96 %.  PHYSICAL EXAMINATION:  GENERAL:  84 y.o.-year-old patient lying in the bed with  acute distress- due to pain in back .  EYES: Pupils equal, round, reactive to light and accommodation. No scleral icterus. Extraocular muscles intact.  HEENT: Head atraumatic,  normocephalic. Oropharynx and nasopharynx clear.  NECK:  Supple, no jugular venous distention. No thyroid enlargement, no tenderness.  LUNGS: Normal breath sounds bilaterally, no wheezing, rales,rhonchi or crepitation. No use of accessory muscles of respiration.  CARDIOVASCULAR: S1, S2 normal. No murmurs, rubs, or gallops.  ABDOMEN: Soft,non tender, nondistended. Bowel sounds present. No organomegaly or mass.   EXTREMITIES: No pedal edema, cyanosis, or clubbing.  Tender in left hip area on local pressure. NEUROLOGIC: Cranial nerves II through XII are intact. Muscle strength 4/5 in all extremities. Sensation intact. Gait not checked.  PSYCHIATRIC: The patient is alert and oriented x 3.  SKIN: No obvious rash, lesion, or ulcer.   Physical Exam LABORATORY PANEL:   CBC Recent Labs  Lab 05/18/19 0452  WBC 23.4*  HGB 11.0*  HCT 34.4*  PLT 315   ------------------------------------------------------------------------------------------------------------------  Chemistries  Recent Labs  Lab 05/12/19 0458  05/18/19 0452  NA 138   < > 136  K 4.0   < > 4.0  CL 102   < > 108  CO2 30   < > 22  GLUCOSE 87   < > 100*  BUN 21   < > 16  CREATININE 1.14*   < > 1.15*  CALCIUM 10.6*   < > 10.1  MG 2.1  --   --    < > = values in this interval not displayed.   ------------------------------------------------------------------------------------------------------------------  Cardiac Enzymes No results for input(s): TROPONINI in the last 168 hours. ------------------------------------------------------------------------------------------------------------------  RADIOLOGY:  No results found.  ASSESSMENT AND PLAN:   Active  Problems:   Compression fracture of lumbar spine, non-traumatic [facility])  Patient is an 84 year old female with a known history of hypertension, hypothyroidism, chronic leukocytosis secondary to history of MDS/MPN and was on Jakafi in the past and recent discharge from  the hospital following kyphoplasty for L4 compression fracture who was subsequently discharged to skilled nursing facility and subsequently went home.  Presented back to the emergency room with complaints of worsening back pain.    1.  Back pain secondary to compression fracture. Patient recently discharged from the hospital following kyphoplasty for L4 compression fracture.  Was discharged to skilled nursing facility and subsequently home.  Back with worsening back pains. MRI of the lumbar spine revealed Patient had MRI of the lumbar spine done today which revealed new acute minimal L1 superior endplate compression fracture withapproximately 10% height loss and trace retropulsion. Unchanged mild L3 and L4 compression deformity status post Kyphoplasty.  No urinary incontinence or retention.  No fecal incontinence.  Patient reports ambulating with assistance at home prior to admission.  L1-L2 kyphoplasty done 05/13/19.  2.  Hypertension; uncontrolled Likely due to pains. Resumed home blood pressure meds.  Placed on PRN IV hydralazine with parameters.  3.  Hypothyroidism. Resume Synthroid.  TSH level was in therapeutic range.  4.  History of depression Stable.  Continue Celexa.  5.  History of asthma Stable.  6.  Abdominal pain complaint-x-ray KUB to rule out obstruction. Give stool softeners. Dulcolax suppository.  7. UTI- cx shows E fecalis   Amoxicilline  8.  Left hip pain get CT abdomen and pelvis. No active findings.  All the records are reviewed and case discussed with Care Management/Social Workerr. Management plans discussed with the patient, family and they are in agreement.  CODE STATUS: Full.  TOTAL TIME TAKING CARE OF THIS PATIENT: 35 minutes.  Patient is requesting if it would be okay for her to go home instead of going to rehab.  Physical therapy still suggesting SNF but if she decides to go home she should need a wheelchair.  POSSIBLE D/C IN 1-2 DAYS,  DEPENDING ON CLINICAL CONDITION.   Vaughan Basta M.D on 05/18/2019   Between 7am to 6pm - Pager - [phone]  After 6pm go to [email or web address] - password [email or web address] [facility]  Office  [phone]  CC: Primary care physician; Zhou-Talbert, Elwyn Lade, MD  Note: This dictation was prepared with Dragon dictation along with smaller phrase technology. Any transcriptional errors that result from this process are unintentional.

## 2019-05-18 NOTE — Progress Notes (Addendum)
Physical Therapy Treatment Patient Details Name: Veronica Hubbard MRN: [id number] DOB: 1934/11/14 Today's Date: 05/18/2019    History of Present Illness presented to ER from home due to worsening back pain; admitted for management of L1-L2 compression fracture, s/p kyphoplasty 7/7.  Of note, previous L3-4 kyphoplasty 6/16; brief stay at [facility] upon discharge with return home.    PT Comments    Pt in bed, reports feeling better with less pain today.  To edge of bed with min a and verbal/tactlie cues for rolling to protect back.  Once sitting, sits with supervision but does c/o some inc L hip pain.  Stood with min guard and was able to step in place x 3.  Took 3 good steps forward with walker and min guard before stopping due to L hip pain.  She was able to step backwards with improved comfort and sat in recliner.  Pt c/o bilateral hip pain at this point.  RN in room and pain medications being addressed.  Gait improved but remains limited by pain.  Pt stated she plans to return home with her sister upon discharge but gait is limited at this time and unable to ambulate household distances.  If she discharges home will benefit from a wheelchair for household mobility.  She is able to transfer with min guard/assist..   Patient suffers from compression fractures  which impairs his/her ability to perform daily activities like toileting, feeding, dressing, grooming, bathing in the home. A cane, walker, crutch will not resolve the patient's issue with performing activities of daily living. A lightweight wheelchair is required/recommended and will allow patient to safely perform daily activities.   Patient can safely propel the wheelchair in the home or has a caregiver who can provide assistance.    Follow Up Recommendations  SNF     Equipment Recommendations  Rolling walker with 5" wheels 3-in-1 commode wheelchair and cushion if discharging home before SNF along with HHPT   Recommendations for  Other Services       Precautions / Restrictions Precautions Precautions: Fall;Back Precaution Comments: s/p kyphoplasty Required Braces or Orthoses: Spinal Brace Spinal Brace: Lumbar corset Restrictions Weight Bearing Restrictions: Yes LLE Weight Bearing: Weight bearing as tolerated Other Position/Activity Restrictions: "WBAT"    Mobility  Bed Mobility Overal bed mobility: Needs Assistance Bed Mobility: Sit to Supine Rolling: Min assist Sidelying to sit: Min assist          Transfers Overall transfer level: Needs assistance Equipment used: Rolling walker (2 wheeled) Transfers: Sit to/from Stand Sit to Stand: Min assist         General transfer comment: stood with ease today but limited due to pain L hip  Ambulation/Gait Ambulation/Gait assistance: Min Web designer (Feet): 6 Feet Assistive device: Standard walker Gait Pattern/deviations: Step-through pattern;Decreased step length - right;Decreased step length - left;Decreased stance time - left Gait velocity: decreased   General Gait Details: able to take 3 good steps forward before being limtied by L hip pain.  less pain noted with backwards gait.   Stairs             Wheelchair Mobility    Modified Rankin (Stroke Patients Only)       Balance Overall balance assessment: Needs assistance Sitting-balance support: Feet supported Sitting balance-Leahy Scale: Fair     Standing balance support: Bilateral upper extremity supported Standing balance-Leahy Scale: Fair  Cognition Arousal/Alertness: Awake/alert Behavior During Therapy: WFL for tasks assessed/performed Overall Cognitive Status: Within Functional Limits for tasks assessed                                        Exercises      General Comments        Pertinent Vitals/Pain Pain Assessment: Faces Faces Pain Scale: Hurts even more Pain Location: L hip pain with attempt  at gait.  Once sitting, c/o B hip pain. Pain Descriptors / Indicators: Grimacing;Moaning Pain Intervention(s): Limited activity within patient's tolerance;Monitored during session;Patient requesting pain meds-RN notified;RN gave pain meds during session    Home Living                      Prior Function            PT Goals (current goals can now be found in the care plan section) Progress towards PT goals: Progressing toward goals    Frequency    7X/week      PT Plan Current plan remains appropriate    Co-evaluation              AM-PAC PT "6 Clicks" Mobility   Outcome Measure  Help needed turning from your back to your side while in a flat bed without using bedrails?: A Little Help needed moving from lying on your back to sitting on the side of a flat bed without using bedrails?: A Little Help needed moving to and from a bed to a chair (including a wheelchair)?: A Little Help needed standing up from a chair using your arms (e.g., wheelchair or bedside chair)?: A Little Help needed to walk in hospital room?: A Lot Help needed climbing 3-5 steps with a railing? : Total 6 Click Score: 15    End of Session Equipment Utilized During Treatment: Gait belt Activity Tolerance: Patient tolerated treatment well;Patient limited by pain Patient left: in chair;with call bell/phone within reach;with chair alarm set Nurse Communication: Patient requests pain meds;Mobility status Pain - Right/Left: Left Pain - part of body: Leg     Time: 3536-1443 PT Time Calculation (min) (ACUTE ONLY): 19 min  Charges:  $Gait Training: 8-22 mins                    Chesley Noon, PTA 05/18/19, 9:19 AM

## 2019-05-19 LAB — GLUCOSE, CAPILLARY
Glucose-Capillary: 97 mg/dL (ref 70–99)
Glucose-Capillary: 104 mg/dL — ABNORMAL HIGH (ref 70–99)

## 2019-05-19 MED ORDER — SERTRALINE HCL 25 MG PO TABS: 25.0000 mg | ORAL_TABLET | Freq: Every day | ORAL | 0 refills | Status: DC

## 2019-05-19 MED ORDER — SENNOSIDES-DOCUSATE SODIUM 8.6-50 MG PO TABS: 1.0000 | ORAL_TABLET | Freq: Two times a day (BID) | ORAL | 0 refills | Status: DC

## 2019-05-19 MED ORDER — UMECLIDINIUM BROMIDE 62.5 MCG/INH IN AEPB: 1.0000 | INHALATION_SPRAY | Freq: Every day | RESPIRATORY_TRACT | 0 refills | Status: DC

## 2019-05-19 MED ORDER — ENOXAPARIN SODIUM 30 MG/0.3ML ~~LOC~~ SOLN: 30.0000 mg | SUBCUTANEOUS | 0 refills | Status: DC

## 2019-05-19 MED ORDER — METOPROLOL TARTRATE 25 MG PO TABS: 25.0000 mg | ORAL_TABLET | Freq: Two times a day (BID) | ORAL | 0 refills | Status: AC

## 2019-05-19 MED ORDER — ENSURE ENLIVE PO LIQD: 237.0000 mL | Freq: Two times a day (BID) | ORAL | 12 refills | Status: DC

## 2019-05-19 MED ORDER — OXYCODONE-ACETAMINOPHEN 7.5-325 MG PO TABS: 1.0000 | ORAL_TABLET | Freq: Four times a day (QID) | ORAL | 0 refills | Status: DC | PRN

## 2019-05-19 NOTE — Discharge Summary (Signed)
[facility] [place] NAME: Veronica Hubbard    MR#:  [id number]  DATE OF BIRTH:  [birth year]  DATE OF ADMISSION:  05/11/2019 ADMITTING PHYSICIAN: Otila Back, MD  DATE OF DISCHARGE: 05/19/2019   PRIMARY CARE PHYSICIAN: Zhou-Talbert, Elwyn Lade, MD    ADMISSION DIAGNOSIS:  Intractable back pain [M54.9] Closed compression fracture of body of L1 vertebra (HCC) [S32.010A]  DISCHARGE DIAGNOSIS:  Active Problems:   Compression fracture of lumbar spine, non-traumatic (HCC)   SECONDARY DIAGNOSIS:   Past Medical History:  Diagnosis Date  . Asthma   . Cancer ([place]) 02/2018   bone marrow cancer  . Depression   . GERD (gastroesophageal reflux disease)   . Hypertension   . Hypothyroidism   . Personal history of chemotherapy   . Spinal stenosis     HOSPITAL COURSE:    Compression fracture of lumbar spine, non-traumatic [facility])  Patient is an 84 year old female witha known history of hypertension,hypothyroidism,chronic leukocytosis secondary tohistory of MDS/MPN and was on Jakafi in the pastand recent discharge from the hospital following kyphoplasty for L4 compression fracture who was subsequently discharged to skilled nursing facility and subsequently went home. Presented back to the emergency room with complaints of worsening back pain.    1.Back pain secondary to compression fracture. Patient recently discharged from the hospital following kyphoplasty for L4 compression fracture. Was discharged to skilled nursing facility and subsequently home. Back with worsening back pains. MRI of the lumbar spine revealed Patient had MRI of the lumbar spine done today which revealednew acute minimal L1 superior endplate compression fracture withapproximately 10% height loss and trace retropulsion. Unchanged mild L3 and L4 compression deformity status post Kyphoplasty.  No urinary incontinence or retention. No fecal incontinence. Patient  reports ambulating with assistance at home prior to admission.  L1-L2 kyphoplasty done 05/13/19.  2.Hypertension;uncontrolled Likely due to pains. Resumed home blood pressure meds. Placed on PRN IV hydralazine with parameters.  3.Hypothyroidism. Resume Synthroid. TSH level was in therapeutic range.  4.History of depression Stable. Continue Celexa.  5.History of asthma Stable.  6.  Abdominal pain complaint-x-ray KUB to rule out obstruction. Give stool softeners. Dulcolax suppository.  7. UTI- cx shows E fecalis   Amoxicilline  8.  Left hip pain get CT abdomen and pelvis. No active findings.  Patient was suggested by physical therapy to go to rehab but she was persistent that she would like to go home with heat treatments and her sister lives with her and can help her in day-to-day activities.  I spoke to sister and she initially was trying to explain the patient to go to rehab but patient did not agree to that hand so we all finally made the plan to discharge patient home with arrangements. DISCHARGE CONDITIONS:   Stable.  CONSULTS OBTAINED:  Treatment Team:  Hessie Knows, MD Vaughan Basta, MD  DRUG ALLERGIES:   Allergies  Allergen Reactions  . Aspirin Shortness Of Breath  . Celecoxib Shortness Of Breath  . Iodinated Diagnostic Agents Other (See Comments)    Reaction:  Decreased kidney function  . Losartan Itching  . Morphine Nausea And Vomiting  . Verapamil Itching    DISCHARGE MEDICATIONS:   Allergies as of 05/19/2019      Reactions   Aspirin Shortness Of Breath   Celecoxib Shortness Of Breath   Iodinated Diagnostic Agents Other (See Comments)   Reaction:  Decreased kidney function   Losartan Itching   Morphine Nausea And Vomiting  Verapamil Itching      Medication List    STOP taking these medications   oxyCODONE 15 MG immediate release tablet Commonly known as: ROXICODONE     TAKE these medications   acetaminophen 325  MG tablet Commonly known as: TYLENOL Take 2 tablets (650 mg total) by mouth every 6 (six) hours as needed for mild pain (or Fever >/= 101).   allopurinol 100 MG tablet Commonly known as: ZYLOPRIM Take 200 mg by mouth daily.   budesonide 0.5 MG/2ML nebulizer solution Commonly known as: PULMICORT Take 0.5 mg by nebulization 2 (two) times daily.   citalopram 20 MG tablet Commonly known as: CELEXA Take 20 mg by mouth daily.   cyclobenzaprine 5 MG tablet Commonly known as: FLEXERIL Take 5-10 mg by mouth at bedtime as needed for muscle spasms.   enoxaparin 30 MG/0.3ML injection Commonly known as: LOVENOX Inject 0.3 mLs (30 mg total) into the skin daily for 15 days. Start taking on: May 20, 2019   ezetimibe 10 MG tablet Commonly known as: ZETIA Take 10 mg by mouth at bedtime.   feeding supplement (ENSURE ENLIVE) Liqd Take 237 mLs by mouth 2 (two) times daily between meals.   Fluticasone-Umeclidin-Vilant 100-62.5-25 MCG/INH Aepb Inhale 1 puff into the lungs daily.   hydrALAZINE 50 MG tablet Commonly known as: APRESOLINE Take 1 tablet (50 mg total) by mouth every 8 (eight) hours.   ipratropium-albuterol 0.5-2.5 (3) MG/3ML Soln Commonly known as: DUONEB Take 3 mLs by nebulization 4 (four) times daily as needed.   isosorbide mononitrate 30 MG 24 hr tablet Commonly known as: IMDUR Take 30 mg by mouth daily.   levothyroxine 150 MCG tablet Commonly known as: SYNTHROID Take 1 tablet (150 mcg total) by mouth daily before breakfast.   Melatonin 5 MG Tabs Take 5 mg by mouth at bedtime.   metoprolol tartrate 25 MG tablet Commonly known as: LOPRESSOR Take 1 tablet (25 mg total) by mouth 2 (two) times daily.   oxyCODONE-acetaminophen 7.5-325 MG tablet Commonly known as: PERCOCET Take 1 tablet by mouth every 6 (six) hours as needed for moderate pain or severe pain. What changed: reasons to take this   pantoprazole 40 MG tablet Commonly known as: PROTONIX Take 40 mg by  mouth 2 (two) times daily.   polyethylene glycol 17 g packet Commonly known as: MIRALAX / GLYCOLAX Take 17 g by mouth daily as needed.   senna-docusate 8.6-50 MG tablet Commonly known as: Senokot-S Take 1 tablet by mouth 2 (two) times daily.   sertraline 25 MG tablet Commonly known as: ZOLOFT Take 1 tablet (25 mg total) by mouth daily. Start taking on: May 20, 2019   triamcinolone cream 0.5 % Commonly known as: KENALOG Apply 1 application topically 3 (three) times daily.   umeclidinium bromide 62.5 MCG/INH Aepb Commonly known as: INCRUSE ELLIPTA Inhale 1 puff into the lungs daily. Start taking on: May 20, 2019   Vitamin D3 25 MCG (1000 UT) Caps Take 1,000 Units by mouth daily.        DISCHARGE INSTRUCTIONS:    Follow with Orthopedic clinic in 2 weeks.  If you experience worsening of your admission symptoms, develop shortness of breath, life threatening emergency, suicidal or homicidal thoughts you must seek medical attention immediately by calling 911 or calling your MD immediately  if symptoms less severe.  You Must read complete instructions/literature along with all the possible adverse reactions/side effects for all the Medicines you take and that have been prescribed to you. Take  any new Medicines after you have completely understood and accept all the possible adverse reactions/side effects.   Please note  You were cared for by a hospitalist during your hospital stay. If you have any questions about your discharge medications or the care you received while you were in the hospital after you are discharged, you can call the unit and asked to speak with the hospitalist on call if the hospitalist that took care of you is not available. Once you are discharged, your primary care physician will handle any further medical issues. Please note that NO REFILLS for any discharge medications will be authorized once you are discharged, as it is imperative that you return to your  primary care physician (or establish a relationship with a primary care physician if you do not have one) for your aftercare needs so that they can reassess your need for medications and monitor your lab values.    Today   CHIEF COMPLAINT:   Chief Complaint  Patient presents with  . Back Pain    HISTORY OF PRESENT ILLNESS:  Veronica Hubbard  is a 84 y.o. 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  Denied having any fall.  Claims to be following all instructions given.  States the pain never completely went away but got worse and had to be brought back to the emergency room for further evaluation today.  Patient was evaluated in the emergency room.  Laboratory studies unremarkable.  COVID test done but results still pending.  Patient had MRI of the lumbar spine done today which revealed new acute minimal L1 superior endplate compression fracture withapproximately 10% height loss and trace retropulsion. Unchanged mild L3 and L4 compression deformity status post kyphoplasty.  Emergency room provider discussed case with orthopedic physician on-call Dr. Posey Pronto who recommended having patient admitted to medical service.  Orthopedic service will see patient in consultation with anticipation of possible surgery tomorrow.   VITAL SIGNS:  Blood pressure (!) 144/63, pulse 63, temperature 98.1 F (36.7 C), temperature source Oral, resp. rate 19, height 5\' 3"  (1.6 m), weight 59 kg, SpO2 97 %.  I/O:    Intake/Output Summary (Last 24 hours) at 05/19/2019 1235 Last data filed at 05/19/2019 0741 Gross per 24 hour  Intake 5159.68 ml  Output -  Net 5159.68 ml    PHYSICAL EXAMINATION:   GENERAL:  84 y.o.-year-old patient lying in  the bed with  acute distress- due to pain in back .  EYES: Pupils equal, round, reactive to light and accommodation. No scleral icterus. Extraocular muscles intact.  HEENT: Head atraumatic, normocephalic. Oropharynx and nasopharynx clear.  NECK:  Supple, no jugular venous distention. No thyroid enlargement, no tenderness.  LUNGS: Normal breath sounds bilaterally, no wheezing, rales,rhonchi or crepitation. No use of accessory muscles of respiration.  CARDIOVASCULAR: S1, S2 normal. No murmurs, rubs, or gallops.  ABDOMEN: Soft,non tender, nondistended. Bowel sounds present. No organomegaly or mass.   EXTREMITIES: No pedal edema, cyanosis, or clubbing.  Tender in left hip area on local pressure. NEUROLOGIC: Cranial nerves II through XII are intact. Muscle strength 4/5 in all extremities. Sensation intact. Gait not checked.  PSYCHIATRIC: The patient is alert and oriented x 3.  SKIN: No obvious rash, lesion, or ulcer.  DATA REVIEW:   CBC  Recent Labs  Lab 05/18/19 0452  WBC 23.4*  HGB 11.0*  HCT 34.4*  PLT 315    Chemistries  Recent Labs  Lab 05/18/19 0452  NA 136  K 4.0  CL 108  CO2 22  GLUCOSE 100*  BUN 16  CREATININE 1.15*  CALCIUM 10.1    Cardiac Enzymes No results for input(s): TROPONINI in the last 168 hours.  Microbiology Results  Results for orders placed or performed during the hospital encounter of 05/11/19  Urine culture     Status: Abnormal   Collection Time: 05/11/19 11:14 AM   Specimen: Urine, Random  Result Value Ref Range Status   Specimen Description   Final    URINE, RANDOM Performed at [facility], [address] [place], [place] [address]    Special Requests   Final    NONE Performed at [facility], [address] [place], [place] [address]    Culture >=100,000 COLONIES/mL ENTEROCOCCUS FAECALIS (A)  Final   Report Status 05/13/2019 FINAL  Final   Organism ID, Bacteria ENTEROCOCCUS FAECALIS (A)  Final      Susceptibility    Enterococcus faecalis - MIC*    AMPICILLIN <=2 SENSITIVE Sensitive     LEVOFLOXACIN >=8 RESISTANT Resistant     NITROFURANTOIN <=16 SENSITIVE Sensitive     VANCOMYCIN 1 SENSITIVE Sensitive     * >=100,000 COLONIES/mL ENTEROCOCCUS FAECALIS  SARS Coronavirus 2 (CEPHEID - Performed in [place] hospital lab), Hosp Order     Status: None   Collection Time: 05/11/19 11:18 AM   Specimen: Nasopharyngeal Swab  Result Value Ref Range Status   SARS Coronavirus 2 NEGATIVE NEGATIVE Final    Comment: (NOTE) If result is NEGATIVE SARS-CoV-2 target nucleic acids are NOT DETECTED. The SARS-CoV-2 RNA is generally detectable in upper and lower  respiratory specimens during the acute phase of infection. The lowest  concentration of SARS-CoV-2 viral copies this assay can detect is 250  copies / mL. A negative result does not preclude SARS-CoV-2 infection  and should not be used as the sole basis for treatment or other  patient management decisions.  A negative result may occur with  improper specimen collection / handling, submission of specimen other  than nasopharyngeal swab, presence of viral mutation(s) within the  areas targeted by this assay, and inadequate number of viral copies  (<250 copies / mL). A negative result must be combined with clinical  observations, patient history, and epidemiological information. If result is POSITIVE SARS-CoV-2 target nucleic acids are DETECTED. The SARS-CoV-2 RNA is generally detectable in upper and lower  respiratory specimens dur ing the acute phase of infection.  Positive  results are indicative of active infection with SARS-CoV-2.  Clinical  correlation with patient history and other diagnostic information is  necessary to determine patient infection status.  Positive results do  not rule out bacterial infection or co-infection with other viruses. If result is PRESUMPTIVE POSTIVE SARS-CoV-2 nucleic acids MAY BE PRESENT.   A presumptive positive result  was obtained on the submitted specimen  and confirmed on repeat testing.  While 2019 novel coronavirus  (SARS-CoV-2) nucleic acids may be present in the submitted sample  additional confirmatory testing may be necessary for epidemiological  and / or clinical management purposes  to differentiate between  SARS-CoV-2 and other Sarbecovirus currently known to infect humans.  If clinically indicated additional testing with an alternate test  methodology [phone]) is advised. The SARS-CoV-2 RNA is generally  detectable in upper  and lower respiratory sp ecimens during the acute  phase of infection. The expected result is Negative. Fact Sheet for Patients:  StrictlyIdeas.no Fact Sheet for Healthcare Providers: BankingDealers.co.za This test is not yet approved or cleared by the [place] FDA and has been authorized for detection and/or diagnosis of SARS-CoV-2 by FDA under an Emergency Use Authorization (EUA).  This EUA will remain in effect (meaning this test can be used) for the duration of the COVID-19 declaration under Section 564(b)(1) of the Act, 21 U.S.C. section 360bbb-3(b)(1), unless the authorization is terminated or revoked sooner. Performed at [facility], [address] [place], [place] [address]   MRSA PCR Screening     Status: Abnormal   Collection Time: 05/13/19  9:46 AM   Specimen: Nasal Mucosa; Nasopharyngeal  Result Value Ref Range Status   MRSA by PCR POSITIVE (A) NEGATIVE Final    Comment:        The GeneXpert MRSA Assay (FDA approved for NASAL specimens only), is one component of a comprehensive MRSA colonization surveillance program. It is not intended to diagnose MRSA infection nor to guide or monitor treatment for MRSA infections. RESULT CALLED TO, READ BACK BY AND VERIFIED WITH:  Yves Dill AT 1112 05/13/2019 [place] Performed at [facility], [address], [place], [place] [address]      RADIOLOGY:  No results found.  EKG:   Orders placed or performed during the hospital encounter of 05/02/19  . ED EKG  . ED EKG  . EKG 12-Lead  . EKG 12-Lead      Management plans discussed with the patient, family and they are in agreement.  CODE STATUS:     Code Status Orders  (From admission, onward)         Start     Ordered   05/11/19 1219  Full code  Continuous     05/11/19 1219        Code Status History    Date Active Date Inactive Code Status Order ID Comments User Context   04/19/2019 0539 04/27/2019 1712 Full Code [id number]  Harrie Foreman, MD Inpatient   02/24/2016 0024 02/24/2016 2023 Full Code [id number]  Henreitta Leber, MD Inpatient   02/09/2016 1042 02/10/2016 2023 Full Code [id number]  Loletha Grayer, MD ED   Advance Care Planning Activity    Advance Directive Documentation     Most Recent Value  Type of Advance Directive  Healthcare Power of Attorney  Pre-existing out of facility DNR order (yellow form or pink MOST form)  -  "MOST" Form in Place?  -      TOTAL TIME TAKING CARE OF THIS PATIENT: 35 minutes.    Vaughan Basta M.D on 05/19/2019 at 12:35 PM  Between 7am to 6pm - Pager - [phone]  After 6pm go to [email or web address] - password [email or web address] [facility]  Office  [phone]  CC: Primary care physician; Zhou-Talbert, Elwyn Lade, MD   Note: This dictation was prepared with Dragon dictation along with smaller phrase technology. Any transcriptional errors that result from this process are unintentional.

## 2019-05-19 NOTE — Care Management Important Message (Signed)
Important Message  Patient Details  Name: Veronica Hubbard MRN: [id number] Date of Birth: August 18, 1935   Medicare Important Message Given:  Yes     Su Hilt, RN 05/19/2019, 2:30 PM

## 2019-05-19 NOTE — TOC Progression Note (Signed)
Transition of Care [facility]) - Progression Note    Patient Details  Name: Veronica Hubbard MRN: [id number] Date of Birth: 20-Aug-1935  [facility] of [facility] CM/SW Contact  Su Hilt, RN Phone Number: 05/19/2019, 10:31 AM  Clinical Narrative:    Spoke to her sister [name], she stated that she is aware the patient wants to go home at DC and is going to provide the transportation, she said the patient does have all DME at home including a Wheelchair and BSC, Alvis Lemmings is set up for [facility] services   Expected Discharge Plan: [place] Barriers to Discharge: Continued Medical Work up  Expected Discharge Plan and Services Expected Discharge Plan: [place]   Discharge Planning Services: CM Consult Post Acute Care Choice: [place] arrangements for the past 2 months: Single Family Home                 DME Arranged: N/A         HH Arranged: PT HH Agency: [place] Date [place]: 05/12/19 Time [place]: 1133 Representative spoke with at [place]: [place] ([place]) Interventions    Readmission Risk Interventions No flowsheet data found.

## 2019-05-19 NOTE — Progress Notes (Signed)
Physical Therapy Treatment Patient Details Name: Veronica Hubbard MRN: [id number] DOB: [birth year] Today's Date: 05/19/2019    History of Present Illness presented to ER from home due to worsening back pain; admitted for management of L1-L2 compression fracture, s/p kyphoplasty 7/7.  Of note, previous L3-4 kyphoplasty 6/16; brief stay at [facility] upon discharge with return home.    PT Comments    Pt agreeable to PT; reports pain is severe across LB, but does not actively display pain responses with session. Pt performs supine and seated exercises well today with assist as needed. Pt demonstrates Mod I with bed mobility with use of rails to edge of bed, but later shows trunk recline and elevation without use of rails. Pt slightly impulsive with need for cues for hand placement and awareness of lines/objects around for safety. Pt ambulates to chair with Min guard (5 ft); pt notes that is enough for now. Pt has w/c for home use and educated on use for safety as pain/fatigue/function require; pt agrees. Continue PT to progress strength, endurance and safety to improve function and prevent falls at home.  Follow Up Recommendations  Home health PT     Equipment Recommendations  Rolling walker with 5" wheels;Other (comment)(has w/c; encouraged use as pain/function require for safety)    Recommendations for Other Services       Precautions / Restrictions Precautions Precautions: Fall;Back Precaution Comments: s/p kyphoplasty Required Braces or Orthoses: Spinal Brace Spinal Brace: Lumbar corset Restrictions Weight Bearing Restrictions: Yes RLE Weight Bearing: Weight bearing as tolerated LLE Weight Bearing: Weight bearing as tolerated    Mobility  Bed Mobility Overal bed mobility: Modified Independent Bed Mobility: Supine to Sit(controlled recline/incline of trunk)           General bed mobility comments: Demonstrates no need for assist this date  Transfers Overall transfer level:  Needs assistance Equipment used: Rolling walker (2 wheeled) Transfers: Sit to/from Stand Sit to Stand: Min guard         General transfer comment: cues for safe hand placement and awareness of IV line/nearby objects  Ambulation/Gait Ambulation/Gait assistance: Min guard Gait Distance (Feet): 5 Feet Assistive device: Rolling walker (2 wheeled)   Gait velocity: decreased   General Gait Details: able to take small decreased clearance steps from bed to nearby chair   Stairs             Wheelchair Mobility    Modified Rankin (Stroke Patients Only)       Balance Overall balance assessment: Needs assistance Sitting-balance support: Feet supported Sitting balance-Leahy Scale: Good     Standing balance support: Bilateral upper extremity supported Standing balance-Leahy Scale: Fair                              Cognition Arousal/Alertness: Awake/alert Behavior During Therapy: WFL for tasks assessed/performed;Impulsive(cues to wait for therapist for safety assist/line mgmt) Overall Cognitive Status: Within Functional Limits for tasks assessed                                 General Comments: Pt reports pain is severe; however, pt demonstrates Mod I to edge of bed with then ability to recline back with eccentric core control onto L elbow and reach for cell charger and then elevate trunk back to sitting position.       Exercises General Exercises - Lower Extremity Ankle Circles/Pumps:  AROM;Both;20 reps Quad Sets: Strengthening;Both;10 reps Gluteal Sets: Strengthening;Both;10 reps Long Arc Quad: AROM;Both;10 reps;Seated Hip ABduction/ADduction: AAROM;Both;10 reps;Supine Straight Leg Raises: AAROM;Both;10 reps;Other (comment) Hip Flexion/Marching: AROM;Both;10 reps Other Exercises Other Exercises: Educated/encouraged core brace with exercise/activity    General Comments        Pertinent Vitals/Pain Pain Assessment: Faces Faces Pain  Scale: Hurts little more(States pain in awful) Pain Location: low back Pain Descriptors / Indicators: Constant(does not display pain response throughout session)    Home Living                      Prior Function            PT Goals (current goals can now be found in the care plan section) Progress towards PT goals: Progressing toward goals    Frequency    7X/week      PT Plan Discharge plan needs to be updated    Co-evaluation              AM-PAC PT "6 Clicks" Mobility   Outcome Measure  Help needed turning from your back to your side while in a flat bed without using bedrails?: A Little Help needed moving from lying on your back to sitting on the side of a flat bed without using bedrails?: A Little Help needed moving to and from a bed to a chair (including a wheelchair)?: A Little Help needed standing up from a chair using your arms (e.g., wheelchair or bedside chair)?: A Little Help needed to walk in hospital room?: A Little Help needed climbing 3-5 steps with a railing? : A Lot 6 Click Score: 17    End of Session Equipment Utilized During Treatment: Gait belt Activity Tolerance: Patient tolerated treatment well;Patient limited by pain Patient left: in chair;with call bell/phone within reach;with chair alarm set   PT Visit Diagnosis: Muscle weakness (generalized) (M62.81);Difficulty in walking, not elsewhere classified (R26.2);Pain Pain - Right/Left: Left Pain - part of body: Leg     Time: 1610-9604 PT Time Calculation (min) (ACUTE ONLY): 34 min  Charges:  $Gait Training: 8-22 mins $Therapeutic Exercise: 8-22 mins                      Larae Grooms, PTA 05/19/2019, 12:16 PM

## 2019-05-19 NOTE — Progress Notes (Signed)
Patient states that she "has a lump from the shots I been getting in my belly". Patient would like doctor to look at her abdomen.

## 2019-05-19 NOTE — Progress Notes (Signed)
Patient discharged from unit today to home with family providing transportation. All personal belongings with patient. Discharge instructions reviewed with patient. Follow up appts and med compliance reviewed.  Patient medicated for back discomfort prior to discharge.  No acute distress noted.  Patient has chronic back pain.

## 2019-05-19 NOTE — Plan of Care (Signed)
  Problem: Pain Managment: Goal: General experience of comfort will improve Outcome: Not Progressing   Problem: Safety: Goal: Ability to remain free from injury will improve Outcome: Not Progressing   Problem: Education: Goal: Knowledge of General Education information will improve Description: Including pain rating scale, medication(s)/side effects and non-pharmacologic comfort measures Outcome: Not Progressing   Problem: Health Behavior/Discharge Planning: Goal: Ability to manage health-related needs will improve Outcome: Not Progressing   Problem: Clinical Measurements: Goal: Ability to maintain clinical measurements within normal limits will improve Outcome: Not Progressing Goal: Will remain free from infection Outcome: Not Progressing Goal: Diagnostic test results will improve Outcome: Not Progressing Goal: Respiratory complications will improve Outcome: Not Progressing Goal: Cardiovascular complication will be avoided Outcome: Not Progressing   Problem: Activity: Goal: Risk for activity intolerance will decrease Outcome: Not Progressing   Problem: Nutrition: Goal: Adequate nutrition will be maintained Outcome: Not Progressing   Problem: Coping: Goal: Level of anxiety will decrease Outcome: Not Progressing   Problem: Elimination: Goal: Will not experience complications related to bowel motility Outcome: Not Progressing Goal: Will not experience complications related to urinary retention Outcome: Not Progressing   Problem: Skin Integrity: Goal: Risk for impaired skin integrity will decrease Outcome: Not Progressing

## 2019-05-20 ENCOUNTER — Observation Stay: Payer: Medicare HMO

## 2019-05-20 ENCOUNTER — Inpatient Hospital Stay (HOSPITAL_COMMUNITY)
Admission: EM | Admit: 2019-05-20 | Discharge: 2019-05-26 | Disposition: A | Discharge status: Skilled Nursing Facility | Payer: Medicare HMO | Source: Home / Self Care | Attending: Internal Medicine | Admitting: Internal Medicine

## 2019-05-20 ENCOUNTER — Other Ambulatory Visit: Payer: Self-pay

## 2019-05-20 ENCOUNTER — Emergency Department: Payer: Medicare HMO

## 2019-05-20 ENCOUNTER — Encounter: Payer: Self-pay | Admitting: Emergency Medicine

## 2019-05-20 DIAGNOSIS — Z8249 Family history of ischemic heart disease and other diseases of the circulatory system: Secondary | ICD-10-CM

## 2019-05-20 DIAGNOSIS — Z1159 Encounter for screening for other viral diseases: Secondary | ICD-10-CM

## 2019-05-20 DIAGNOSIS — R040 Epistaxis: Secondary | ICD-10-CM | POA: Diagnosis not present

## 2019-05-20 DIAGNOSIS — Z7951 Long term (current) use of inhaled steroids: Secondary | ICD-10-CM

## 2019-05-20 DIAGNOSIS — M4800 Spinal stenosis, site unspecified: Secondary | ICD-10-CM | POA: Diagnosis present

## 2019-05-20 DIAGNOSIS — Z79890 Hormone replacement therapy (postmenopausal): Secondary | ICD-10-CM

## 2019-05-20 DIAGNOSIS — Z79899 Other long term (current) drug therapy: Secondary | ICD-10-CM

## 2019-05-20 DIAGNOSIS — R6883 Chills (without fever): Secondary | ICD-10-CM | POA: Diagnosis not present

## 2019-05-20 DIAGNOSIS — I10 Essential (primary) hypertension: Secondary | ICD-10-CM | POA: Diagnosis present

## 2019-05-20 DIAGNOSIS — Z993 Dependence on wheelchair: Secondary | ICD-10-CM

## 2019-05-20 DIAGNOSIS — E039 Hypothyroidism, unspecified: Secondary | ICD-10-CM | POA: Diagnosis present

## 2019-05-20 DIAGNOSIS — M4856XD Collapsed vertebra, not elsewhere classified, lumbar region, subsequent encounter for fracture with routine healing: Principal | ICD-10-CM | POA: Diagnosis present

## 2019-05-20 DIAGNOSIS — R112 Nausea with vomiting, unspecified: Secondary | ICD-10-CM | POA: Diagnosis not present

## 2019-05-20 DIAGNOSIS — M549 Dorsalgia, unspecified: Secondary | ICD-10-CM

## 2019-05-20 DIAGNOSIS — Z8781 Personal history of (healed) traumatic fracture: Secondary | ICD-10-CM

## 2019-05-20 DIAGNOSIS — Z806 Family history of leukemia: Secondary | ICD-10-CM

## 2019-05-20 DIAGNOSIS — Z888 Allergy status to other drugs, medicaments and biological substances status: Secondary | ICD-10-CM

## 2019-05-20 DIAGNOSIS — D469 Myelodysplastic syndrome, unspecified: Secondary | ICD-10-CM | POA: Diagnosis present

## 2019-05-20 DIAGNOSIS — Z90710 Acquired absence of both cervix and uterus: Secondary | ICD-10-CM

## 2019-05-20 DIAGNOSIS — J45909 Unspecified asthma, uncomplicated: Secondary | ICD-10-CM | POA: Diagnosis present

## 2019-05-20 DIAGNOSIS — K219 Gastro-esophageal reflux disease without esophagitis: Secondary | ICD-10-CM | POA: Diagnosis present

## 2019-05-20 DIAGNOSIS — F329 Major depressive disorder, single episode, unspecified: Secondary | ICD-10-CM | POA: Diagnosis present

## 2019-05-20 DIAGNOSIS — Z515 Encounter for palliative care: Secondary | ICD-10-CM

## 2019-05-20 DIAGNOSIS — Z885 Allergy status to narcotic agent status: Secondary | ICD-10-CM

## 2019-05-20 DIAGNOSIS — Z9221 Personal history of antineoplastic chemotherapy: Secondary | ICD-10-CM

## 2019-05-20 DIAGNOSIS — R109 Unspecified abdominal pain: Secondary | ICD-10-CM | POA: Diagnosis not present

## 2019-05-20 DIAGNOSIS — Z91041 Radiographic dye allergy status: Secondary | ICD-10-CM

## 2019-05-20 DIAGNOSIS — Z803 Family history of malignant neoplasm of breast: Secondary | ICD-10-CM

## 2019-05-20 DIAGNOSIS — Z87891 Personal history of nicotine dependence: Secondary | ICD-10-CM

## 2019-05-20 LAB — CBC WITH DIFFERENTIAL/PLATELET
Abs Immature Granulocytes: 2.16 10*3/uL — ABNORMAL HIGH (ref 0.00–0.07)
Basophils Absolute: 0.5 10*3/uL — ABNORMAL HIGH (ref 0.0–0.1)
Basophils Relative: 2 %
Eosinophils Absolute: 1.8 10*3/uL — ABNORMAL HIGH (ref 0.0–0.5)
Eosinophils Relative: 7 %
HCT: 37.4 % (ref 36.0–46.0)
Hemoglobin: 12 g/dL (ref 12.0–15.0)
Immature Granulocytes: 9 %
Lymphocytes Relative: 10 %
Lymphs Abs: 2.4 10*3/uL (ref 0.7–4.0)
MCH: 27.5 pg (ref 26.0–34.0)
MCHC: 32.1 g/dL (ref 30.0–36.0)
MCV: 85.8 fL (ref 80.0–100.0)
Monocytes Absolute: 1.1 10*3/uL — ABNORMAL HIGH (ref 0.1–1.0)
Monocytes Relative: 5 %
Neutro Abs: 16 10*3/uL — ABNORMAL HIGH (ref 1.7–7.7)
Neutrophils Relative %: 67 %
Platelets: 353 10*3/uL (ref 150–400)
RBC: 4.36 MIL/uL (ref 3.87–5.11)
RDW: 20.8 % — ABNORMAL HIGH (ref 11.5–15.5)
Smear Review: NORMAL
WBC: 23.8 10*3/uL — ABNORMAL HIGH (ref 4.0–10.5)
nRBC: 0.3 % — ABNORMAL HIGH (ref 0.0–0.2)

## 2019-05-20 LAB — BASIC METABOLIC PANEL
Anion gap: 9 (ref 5–15)
BUN: 13 mg/dL (ref 8–23)
CO2: 22 mmol/L (ref 22–32)
Calcium: 10.7 mg/dL — ABNORMAL HIGH (ref 8.9–10.3)
Chloride: 106 mmol/L (ref 98–111)
Creatinine, Ser: 0.97 mg/dL (ref 0.44–1.00)
GFR calc Af Amer: >60 mL/min (ref >60)
GFR calc non Af Amer: 54 mL/min — ABNORMAL LOW (ref >60)
Glucose, Bld: 120 mg/dL — ABNORMAL HIGH (ref 70–99)
Potassium: 3.7 mmol/L (ref 3.5–5.1)
Sodium: 137 mmol/L (ref 135–145)

## 2019-05-20 LAB — C-REACTIVE PROTEIN: CRP: <0.8 mg/dL (ref <1.0)

## 2019-05-20 LAB — SARS CORONAVIRUS 2 BY RT PCR (HOSPITAL ORDER, PERFORMED IN ~~LOC~~ HOSPITAL LAB): SARS Coronavirus 2: NEGATIVE

## 2019-05-20 LAB — SEDIMENTATION RATE: Sed Rate: 16 mm/hr (ref 0–30)

## 2019-05-20 MED ORDER — SODIUM CHLORIDE 0.9% FLUSH: 3.0000 mL | INTRAVENOUS | Status: DC | PRN

## 2019-05-20 MED ORDER — EZETIMIBE 10 MG PO TABS
10.0000 mg | ORAL_TABLET | Freq: Every day | ORAL | Status: DC
  Administered 2019-05-20 – 2019-05-25 (×6): 10 mg via ORAL
  Filled 2019-05-20 (×7): qty 1

## 2019-05-20 MED ORDER — FENTANYL CITRATE (PF) 100 MCG/2ML IJ SOLN
50.0000 ug | Freq: Once | INTRAMUSCULAR | Status: AC
  Administered 2019-05-20: 12:00:00 50 ug via INTRAVENOUS
  Filled 2019-05-20: qty 2

## 2019-05-20 MED ORDER — ACETAMINOPHEN 325 MG PO TABS: 650.0000 mg | ORAL_TABLET | Freq: Four times a day (QID) | ORAL | Status: DC | PRN

## 2019-05-20 MED ORDER — ONDANSETRON HCL 4 MG/2ML IJ SOLN
4.0000 mg | Freq: Once | INTRAMUSCULAR | Status: AC
  Administered 2019-05-20: 12:00:00 4 mg via INTRAVENOUS
  Filled 2019-05-20: qty 2

## 2019-05-20 MED ORDER — SENNOSIDES-DOCUSATE SODIUM 8.6-50 MG PO TABS
1.0000 | ORAL_TABLET | Freq: Two times a day (BID) | ORAL | Status: DC
  Administered 2019-05-21 – 2019-05-26 (×11): 1 via ORAL
  Filled 2019-05-20 (×11): qty 1

## 2019-05-20 MED ORDER — ARFORMOTEROL TARTRATE 15 MCG/2ML IN NEBU
15.0000 ug | INHALATION_SOLUTION | Freq: Two times a day (BID) | RESPIRATORY_TRACT | Status: DC
  Administered 2019-05-21 – 2019-05-26 (×10): 15 ug via RESPIRATORY_TRACT
  Filled 2019-05-20 (×14): qty 2

## 2019-05-20 MED ORDER — SERTRALINE HCL 50 MG PO TABS
25.0000 mg | ORAL_TABLET | Freq: Every day | ORAL | Status: DC
  Administered 2019-05-21: 10:00:00 25 mg via ORAL
  Filled 2019-05-20: qty 1

## 2019-05-20 MED ORDER — ACETAMINOPHEN 650 MG RE SUPP: 650.0000 mg | Freq: Four times a day (QID) | RECTAL | Status: DC | PRN

## 2019-05-20 MED ORDER — SODIUM CHLORIDE 0.9 % IV SOLN: 250.0000 mL | INTRAVENOUS | Status: DC | PRN

## 2019-05-20 MED ORDER — ACETAMINOPHEN 325 MG PO TABS
650.0000 mg | ORAL_TABLET | Freq: Four times a day (QID) | ORAL | Status: DC | PRN
  Administered 2019-05-21 – 2019-05-25 (×2): 650 mg via ORAL
  Filled 2019-05-20 (×2): qty 2

## 2019-05-20 MED ORDER — ENOXAPARIN SODIUM 40 MG/0.4ML ~~LOC~~ SOLN
40.0000 mg | SUBCUTANEOUS | Status: DC
  Administered 2019-05-20: 40 mg via SUBCUTANEOUS
  Filled 2019-05-20: qty 0.4

## 2019-05-20 MED ORDER — PANTOPRAZOLE SODIUM 40 MG PO TBEC
40.0000 mg | DELAYED_RELEASE_TABLET | Freq: Two times a day (BID) | ORAL | Status: DC
  Administered 2019-05-20 – 2019-05-26 (×12): 40 mg via ORAL
  Filled 2019-05-20 (×12): qty 1

## 2019-05-20 MED ORDER — ENSURE ENLIVE PO LIQD
237.0000 mL | Freq: Two times a day (BID) | ORAL | Status: DC
  Administered 2019-05-21: 237 mL via ORAL

## 2019-05-20 MED ORDER — FLUTICASONE-UMECLIDIN-VILANT 100-62.5-25 MCG/INH IN AEPB: 1.0000 | INHALATION_SPRAY | Freq: Every day | RESPIRATORY_TRACT | Status: DC

## 2019-05-20 MED ORDER — MELATONIN 5 MG PO TABS
5.0000 mg | ORAL_TABLET | Freq: Every day | ORAL | Status: DC
  Administered 2019-05-20 – 2019-05-25 (×6): 5 mg via ORAL
  Filled 2019-05-20 (×7): qty 1

## 2019-05-20 MED ORDER — METOPROLOL TARTRATE 25 MG PO TABS
25.0000 mg | ORAL_TABLET | Freq: Two times a day (BID) | ORAL | Status: DC
  Administered 2019-05-20 – 2019-05-26 (×10): 25 mg via ORAL
  Filled 2019-05-20 (×12): qty 1

## 2019-05-20 MED ORDER — OXYCODONE-ACETAMINOPHEN 7.5-325 MG PO TABS
1.0000 | ORAL_TABLET | Freq: Four times a day (QID) | ORAL | Status: DC | PRN
  Administered 2019-05-20 – 2019-05-21 (×2): 1 via ORAL
  Filled 2019-05-20 (×2): qty 1

## 2019-05-20 MED ORDER — LEVOTHYROXINE SODIUM 50 MCG PO TABS
150.0000 ug | ORAL_TABLET | Freq: Every day | ORAL | Status: DC
  Administered 2019-05-21 – 2019-05-26 (×6): 150 ug via ORAL
  Filled 2019-05-20 (×7): qty 1

## 2019-05-20 MED ORDER — SODIUM CHLORIDE 0.9% FLUSH
3.0000 mL | Freq: Two times a day (BID) | INTRAVENOUS | Status: DC
  Administered 2019-05-20 – 2019-05-26 (×12): 3 mL via INTRAVENOUS

## 2019-05-20 MED ORDER — ISOSORBIDE MONONITRATE ER 30 MG PO TB24
30.0000 mg | ORAL_TABLET | Freq: Every day | ORAL | Status: DC
  Administered 2019-05-21 – 2019-05-26 (×5): 30 mg via ORAL
  Filled 2019-05-20 (×6): qty 1

## 2019-05-20 MED ORDER — UMECLIDINIUM BROMIDE 62.5 MCG/INH IN AEPB
1.0000 | INHALATION_SPRAY | Freq: Every day | RESPIRATORY_TRACT | Status: DC
  Administered 2019-05-21 – 2019-05-26 (×6): 1 via RESPIRATORY_TRACT
  Filled 2019-05-20: qty 7

## 2019-05-20 MED ORDER — HYDRALAZINE HCL 50 MG PO TABS
50.0000 mg | ORAL_TABLET | Freq: Three times a day (TID) | ORAL | Status: DC
  Administered 2019-05-20 – 2019-05-25 (×13): 50 mg via ORAL
  Filled 2019-05-20 (×16): qty 1

## 2019-05-20 MED ORDER — CYCLOBENZAPRINE HCL 10 MG PO TABS
5.0000 mg | ORAL_TABLET | Freq: Every evening | ORAL | Status: DC | PRN
  Administered 2019-05-20 – 2019-05-25 (×4): 10 mg via ORAL
  Filled 2019-05-20 (×4): qty 1

## 2019-05-20 MED ORDER — BISACODYL 5 MG PO TBEC
5.0000 mg | DELAYED_RELEASE_TABLET | Freq: Every day | ORAL | Status: DC | PRN
  Administered 2019-05-21 – 2019-05-24 (×2): 5 mg via ORAL
  Filled 2019-05-20 (×2): qty 1

## 2019-05-20 MED ORDER — ONDANSETRON HCL 4 MG PO TABS
4.0000 mg | ORAL_TABLET | Freq: Four times a day (QID) | ORAL | Status: DC | PRN
  Administered 2019-05-20 – 2019-05-25 (×3): 4 mg via ORAL
  Filled 2019-05-20 (×3): qty 1

## 2019-05-20 MED ORDER — ONDANSETRON HCL 4 MG/2ML IJ SOLN
4.0000 mg | Freq: Four times a day (QID) | INTRAMUSCULAR | Status: DC | PRN
  Administered 2019-05-21 – 2019-05-26 (×5): 4 mg via INTRAVENOUS
  Filled 2019-05-20 (×6): qty 2

## 2019-05-20 MED ORDER — DOCUSATE SODIUM 100 MG PO CAPS
100.0000 mg | ORAL_CAPSULE | Freq: Two times a day (BID) | ORAL | Status: DC
  Administered 2019-05-20 – 2019-05-26 (×12): 100 mg via ORAL
  Filled 2019-05-20 (×12): qty 1

## 2019-05-20 MED ORDER — CITALOPRAM HYDROBROMIDE 20 MG PO TABS
20.0000 mg | ORAL_TABLET | Freq: Every day | ORAL | Status: DC
  Administered 2019-05-21 – 2019-05-26 (×6): 20 mg via ORAL
  Filled 2019-05-20 (×6): qty 1

## 2019-05-20 MED ORDER — VITAMIN D 25 MCG (1000 UNIT) PO TABS
1000.0000 [IU] | ORAL_TABLET | Freq: Every day | ORAL | Status: DC
  Administered 2019-05-21 – 2019-05-26 (×6): 1000 [IU] via ORAL
  Filled 2019-05-20 (×6): qty 1

## 2019-05-20 MED ORDER — ALLOPURINOL 100 MG PO TABS
200.0000 mg | ORAL_TABLET | Freq: Every day | ORAL | Status: DC
  Administered 2019-05-21 – 2019-05-26 (×6): 200 mg via ORAL
  Filled 2019-05-20 (×6): qty 2

## 2019-05-20 MED ORDER — GADOBUTROL 1 MMOL/ML IV SOLN
6.0000 mL | Freq: Once | INTRAVENOUS | Status: AC | PRN
  Administered 2019-05-20: 19:00:00 6 mL via INTRAVENOUS

## 2019-05-20 MED ORDER — TRAZODONE HCL 50 MG PO TABS
25.0000 mg | ORAL_TABLET | Freq: Every evening | ORAL | Status: DC | PRN
  Administered 2019-05-21 – 2019-05-24 (×4): 25 mg via ORAL
  Filled 2019-05-20 (×4): qty 1

## 2019-05-20 MED ORDER — BUDESONIDE 0.5 MG/2ML IN SUSP
0.5000 mg | Freq: Two times a day (BID) | RESPIRATORY_TRACT | Status: DC
  Administered 2019-05-20 – 2019-05-26 (×11): 0.5 mg via RESPIRATORY_TRACT
  Filled 2019-05-20 (×12): qty 2

## 2019-05-20 MED ORDER — POLYETHYLENE GLYCOL 3350 17 G PO PACK
17.0000 g | PACK | Freq: Every day | ORAL | Status: DC | PRN
  Administered 2019-05-21: 16:00:00 17 g via ORAL
  Filled 2019-05-20: qty 1

## 2019-05-20 NOTE — Progress Notes (Signed)
OT Cancellation Note  Patient Details Name: Veronica Hubbard MRN: [id number] DOB: 1935/08/10   Cancelled Treatment:    Reason Eval/Treat Not Completed: Other (comment). Consult received, chart reviewed. Pt presented to ED with significant back pain. MRI of lumbar spine and pelvis pending. Will hold OT evaluation this date and re-attempt next date as medically appropriate pending updated plan of care based on imaging.   Jeni Salles, MPH, MS, OTR/L ascom [phone] 05/20/19, 4:25 PM

## 2019-05-20 NOTE — ED Provider Notes (Signed)
[facility] Emergency Department Provider Note  ____________________________________________   First MD Initiated Contact with Patient 05/20/19 1057     (approximate)  I have reviewed the triage vital signs and the nursing notes.   HISTORY  Chief Complaint Back Pain    HPI Veronica Hubbard is a 84 y.o. female Patient has had multiple compression fractures, S/P L3 Kypho on 6/4, L4 Kypho on 6/16, and L1-L2 Kypho on 7/7. Her sister [name] ([place]) says that pain has gotten much worse.  Patient says over the past 2 days she has had worsening bilateral pelvic pain that radiates into her abdomen.  The pain is severe, intermittent, worse with trying to move around, nothing makes it better.  She does endorses pain in her lower lumbar region.  She says that prior to surgery she was ambulating with a walker and wheelchair however recently she has been more confined to a wheelchair.  She denies any vomiting or fevers. Pain is severe, constant, worse with moving, better with rest.  No fevers.            Past Medical History:  Diagnosis Date   Asthma    Cancer ([place]) 02/2018   bone marrow cancer   Depression    GERD (gastroesophageal reflux disease)    Hypertension    Hypothyroidism    Personal history of chemotherapy    Spinal stenosis     Patient Active Problem List   Diagnosis Date Noted   Compression fracture of lumbar spine, non-traumatic ([place]) 05/11/2019   Intractable pain 04/19/2019   AAA (abdominal aortic aneurysm) without rupture ([place]) 04/09/2017   Bilateral carotid artery stenosis 04/09/2017   Thoracic aortic aneurysm without rupture ([place]) 04/09/2017   Hyponatremia 02/23/2016   Chest pain 02/23/2016   Gastroenteritis due to norovirus 02/09/2016    Past Surgical History:  Procedure Laterality Date   ABDOMINAL HYSTERECTOMY     BACK SURGERY     BREAST BIOPSY Left 2003   neg   cataracts     KYPHOPLASTY N/A  04/22/2019   Procedure: KYPHOPLASTY;  Surgeon: Hessie Knows, MD;  Location: [facility] ORS;  Service: Orthopedics;  Laterality: N/A;   KYPHOPLASTY N/A 04/10/2019   Procedure: KYPHOPLASTY L3;  Surgeon: Hessie Knows, MD;  Location: [facility] ORS;  Service: Orthopedics;  Laterality: N/A;   KYPHOPLASTY N/A 05/13/2019   Procedure: KYPHOPLASTY L1 L2;  Surgeon: Hessie Knows, MD;  Location: [facility] ORS;  Service: Orthopedics;  Laterality: N/A;   lipoma removal     SHOULDER SURGERY      Prior to Admission medications   Medication Sig Start Date End Date Taking? Authorizing Provider  acetaminophen (TYLENOL) 325 MG tablet Take 2 tablets (650 mg total) by mouth every 6 (six) hours as needed for mild pain (or Fever >/= 101). 04/27/19   Mayo, Pete Pelt, MD  allopurinol (ZYLOPRIM) 100 MG tablet Take 200 mg by mouth daily. 05/04/18   [provider]  budesonide (PULMICORT) 0.5 MG/2ML nebulizer solution Take 0.5 mg by nebulization 2 (two) times daily.    [provider]  Cholecalciferol (VITAMIN D3) 25 MCG (1000 UT) CAPS Take 1,000 Units by mouth daily.    [provider]  citalopram (CELEXA) 20 MG tablet Take 20 mg by mouth daily.    [provider]  cyclobenzaprine (FLEXERIL) 5 MG tablet Take 5-10 mg by mouth at bedtime as needed for muscle spasms.    [provider]  enoxaparin (LOVENOX) 30 MG/0.3ML injection Inject 0.3 mLs (30  mg total) into the skin daily for 15 days. 05/20/19 06/04/19  Vaughan Basta, MD  ezetimibe (ZETIA) 10 MG tablet Take 10 mg by mouth at bedtime.     [provider]  feeding supplement, ENSURE ENLIVE, (ENSURE ENLIVE) LIQD Take 237 mLs by mouth 2 (two) times daily between meals. 05/19/19   Vaughan Basta, MD  Fluticasone-Umeclidin-Vilant 100-62.5-25 MCG/INH AEPB Inhale 1 puff into the lungs daily.    [provider]  hydrALAZINE (APRESOLINE) 50 MG tablet Take 1 tablet (50 mg total) by mouth every 8 (eight) hours. 04/27/19    Mayo, Pete Pelt, MD  ipratropium-albuterol (DUONEB) 0.5-2.5 (3) MG/3ML SOLN Take 3 mLs by nebulization 4 (four) times daily as needed.    [provider]  isosorbide mononitrate (IMDUR) 30 MG 24 hr tablet Take 30 mg by mouth daily.    [provider]  levothyroxine (SYNTHROID) 150 MCG tablet Take 1 tablet (150 mcg total) by mouth daily before breakfast. 04/28/19   Mayo, Pete Pelt, MD  Melatonin 5 MG TABS Take 5 mg by mouth at bedtime.    [provider]  metoprolol tartrate (LOPRESSOR) 25 MG tablet Take 1 tablet (25 mg total) by mouth 2 (two) times daily. 05/19/19   Vaughan Basta, MD  oxyCODONE-acetaminophen (PERCOCET) 7.5-325 MG tablet Take 1 tablet by mouth every 6 (six) hours as needed for moderate pain or severe pain. 05/19/19   Vaughan Basta, MD  pantoprazole (PROTONIX) 40 MG tablet Take 40 mg by mouth 2 (two) times daily.    [provider]  polyethylene glycol (MIRALAX / GLYCOLAX) 17 g packet Take 17 g by mouth daily as needed. 04/27/19   Mayo, Pete Pelt, MD  senna-docusate (SENOKOT-S) 8.6-50 MG tablet Take 1 tablet by mouth 2 (two) times daily. 05/19/19   Vaughan Basta, MD  sertraline (ZOLOFT) 25 MG tablet Take 1 tablet (25 mg total) by mouth daily. 05/20/19   Vaughan Basta, MD  triamcinolone cream (KENALOG) 0.5 % Apply 1 application topically 3 (three) times daily.    [provider]  umeclidinium bromide (INCRUSE ELLIPTA) 62.5 MCG/INH AEPB Inhale 1 puff into the lungs daily. 05/20/19   Vaughan Basta, MD    Allergies Aspirin, Celecoxib, Iodinated diagnostic agents, Losartan, Morphine, and Verapamil  Family History  Problem Relation Age of Onset   Leukemia Mother    CAD Father    Diabetes Father    Breast cancer Sister 51    Social History Social History   Tobacco Use   Smoking status: Former Smoker    Types: Cigarettes   Smokeless tobacco: Never Used  Substance Use Topics   Alcohol  use: No   Drug use: No      Review of Systems Constitutional: No fever/chills Eyes: No visual changes. ENT: No sore throat. Cardiovascular: Denies chest pain. Respiratory: Denies shortness of breath. Gastrointestinal: No abdominal pain.  No nausea, no vomiting.  No diarrhea.  No constipation. Genitourinary: Negative for dysuria. Musculoskeletal: positive back pain.  Skin: Negative for rash. Neurological: Negative for headaches, focal weakness or numbness. All other ROS negative ____________________________________________   PHYSICAL EXAM:  VITAL SIGNS: ED Triage Vitals [05/20/19 1035]  Enc Vitals Group     BP (!) 123/43     Pulse Rate 75     Resp 16     Temp 98.2 F (36.8 C)     Temp Source Oral     SpO2 97 %     Weight 130 lb (59 kg)  Height 5\' 3"  (1.6 m)     Head Circumference      Peak Flow      Pain Score 9     Pain Loc      Pain Edu?      Excl. in [place]?     Constitutional: Alert and oriented. Well appearing and in no acute distress. Eyes: Conjunctivae are normal. EOMI. Head: Atraumatic. Nose: No congestion/rhinnorhea. Mouth/Throat: Mucous membranes are moist.   Neck: No stridor. Trachea Midline. FROM Cardiovascular: Normal rate, regular rhythm. Grossly normal heart sounds.  Good peripheral circulation. Respiratory: Normal respiratory effort.  No retractions. Lungs CTAB. Gastrointestinal: Soft and tender in the lower abdomen. No distention. No abdominal bruits.  Musculoskeletal: No lower extremity tenderness nor edema.  No joint effusions. Neurologic:  Normal speech and language. No gross focal neurologic deficits are appreciated.  Skin:  Skin is warm, dry and intact. No rash noted. Well healing scars. Old bruising noted.  Psychiatric: Mood and affect are normal. Speech and behavior are normal. Back: Lower lumbar tenderness and bilateral hip tenderness  ____________________________________________   LABS (all labs ordered are listed, but only  abnormal results are displayed)  Labs Reviewed  CBC WITH DIFFERENTIAL/PLATELET - Abnormal; Notable for the following components:      Result Value   WBC 23.8 (*)    RDW 20.8 (*)    nRBC 0.3 (*)    Neutro Abs 16.0 (*)    Monocytes Absolute 1.1 (*)    Eosinophils Absolute 1.8 (*)    Basophils Absolute 0.5 (*)    Abs Immature Granulocytes 2.16 (*)    All other components within normal limits  BASIC METABOLIC PANEL - Abnormal; Notable for the following components:   Glucose, Bld 120 (*)    Calcium 10.7 (*)    GFR calc non Af Amer 54 (*)    All other components within normal limits  SARS CORONAVIRUS 2 (HOSPITAL ORDER, [place] [facility])  SEDIMENTATION RATE  C-REACTIVE PROTEIN   ____________________________________________   ____________________________________________  RADIOLOGY  Official radiology report(s): Ct Abdomen Pelvis Wo Contrast  Result Date: 05/20/2019 CLINICAL DATA:  Low back pain.  Abdominal pain and distension. EXAM: CT ABDOMEN AND PELVIS WITHOUT CONTRAST TECHNIQUE: Multidetector CT imaging of the abdomen and pelvis was performed following the standard protocol without IV contrast. COMPARISON:  CT scan 05/16/2019 FINDINGS: Lower chest: The lung bases are clear. The heart is normal in size. No pericardial effusion. Stable coronary artery calcifications and aortic calcifications. Hepatobiliary: No focal hepatic lesions or intrahepatic biliary dilatation. The gallbladder is grossly normal. No common bile duct dilatation. Pancreas: No mass, inflammation or ductal dilatation. Spleen: Stable fairly massive splenomegaly. The spleen measures 18.5 x 13.0 x 10.5 cm. No focal splenic lesions. Adrenals/Urinary Tract: The adrenal glands are unremarkable and stable. There are bilateral renal cysts and a stable lower pole right renal calculus measuring 6.5 mm. The bladder is mildly distended and contains a small amount of non dependent air which may be due to recent  instrumentation or catheterization. Stomach/Bowel: The stomach, duodenum, small bowel and colon are grossly normal without oral contrast. No inflammatory changes, mass lesions or obstructive findings. The appendix is normal. Vascular/Lymphatic: Stable marked tortuosity, ectasia and heavy calcification of the abdominal aorta. Stable infrarenal abdominal aortic aneurysm measuring a maximum of 3.9 cm. Fairly extensive branch vessel calcifications also appears stable. No retroperitoneal mass or adenopathy. Reproductive: Surgically absent. Other: No pelvic mass or pelvic adenopathy. No free pelvic fluid collections. No  inguinal mass or adenopathy. Musculoskeletal: No acute bony findings. Stable vertebral augmentation changes at L1, L2, L3 and L4. IMPRESSION: 1. No acute abdominal/pelvic findings, mass lesions or adenopathy. No interval change from CT scan 4 days ago. 2. Stable splenomegaly. 3. Stable advanced vascular disease with 3.9 cm infrarenal abdominal aortic aneurysm. 4. Stable renal cysts and right renal calculus. 5. Small amount of air noted in the bladder, possibly later to recent instrumentation or catheterization. Electronically Signed   By: Marijo Sanes M.D.   On: 05/20/2019 12:19   Ct L-spine No Charge  Result Date: 05/20/2019 CLINICAL DATA:  Back pain. EXAM: CT LUMBAR SPINE WITHOUT CONTRAST TECHNIQUE: Multidetector CT imaging of the lumbar spine was performed without intravenous contrast administration. Multiplanar CT image reconstructions were also generated. COMPARISON:  MRI 05/11/2019 FINDINGS: Segmentation: There are five lumbar type vertebral bodies. The last full intervertebral disc space is labeled L5-S1. This correlates with the prior MRI. Alignment: Stable degenerative anterolisthesis of L3 and L4. Vertebrae: Vertebral augmentation changes noted at L1, L2, L3 and L4. Bone cement cement noted in the epidural spaces at L1, L2 and L4 but no significant canal stenosis. No acute fractures are  identified. Stable advanced facet disease with facet fusions most notably at L3-4 and L4-5 Paraspinal and other soft tissues: No significant paraspinal or retroperitoneal findings. Stable advanced aortic calcifications. 3.7 cm infrarenal abdominal aortic aneurysm. A right renal cyst is stable. Disc levels: No large disc protrusions are identified. There is stable moderate to moderately severe spinal and bilateral lateral recess stenosis at L3-4. IMPRESSION: 1. Vertebral augmentation changes at L1, L2, L3 and L4. Epidural bone cement is noted at multiple levels but no significant canal stenosis. 2. No acute fractures. 3. Stable advanced degenerative lumbar spondylosis with multilevel disc disease and facet disease. Stable appearing moderate to moderately severe spinal and bilateral lateral recess stenosis at L3-4. Electronically Signed   By: Marijo Sanes M.D.   On: 05/20/2019 12:13    ____________________________________________   PROCEDURES  Procedure(s) performed (including Critical Care):  Procedures   ____________________________________________   INITIAL IMPRESSION / ASSESSMENT AND PLAN / ED COURSE  Lerline Valdivia was evaluated in Emergency Department on 05/20/2019 for the symptoms described in the history of present illness. She was evaluated in the context of the global COVID-19 pandemic, which necessitated consideration that the patient might be at risk for infection with the SARS-CoV-2 virus that causes COVID-19. Institutional protocols and algorithms that pertain to the evaluation of patients at risk for COVID-19 are in a state of rapid change based on information released by regulatory bodies including the CDC and federal and state organizations. These policies and algorithms were followed during the patient's care in the ED.     Given patient also has abdominal pain will get CT imaging to rule out issues with her aortic aneurysm.  Will also get to evaluate for diverticulitis,  SBO.  We also do some CT imaging of her lumbar region.  If this is reassuring patient may need admission for further work-up with MRI to look for epidural abscess/occult fracture and pain control.  She denies any symptoms to suggest UTI.  Clinical Course as of May 19 1429  Tue May 20, 2019  1414 CT imaging was stable.  However white count noted to be 23.8.   [MF]    Clinical Course User Index [MF] Vanessa [place], MD     CT imaging is reassuring.  Discussed with Dr. Dorathy Daft with the plan for admission  and MRI. Messaged Dr. Rudene Christians who is aware.   Discussed with Dr. Posey Pronto from hospital team and plan to admit patient.   for MRI, PT/OT, Pain control.  ____________________________________________   FINAL CLINICAL IMPRESSION(S) / ED DIAGNOSES   Final diagnoses:  Back pain      MEDICATIONS GIVEN DURING THIS VISIT:  Medications  fentaNYL (SUBLIMAZE) injection 50 mcg (50 mcg Intravenous Given 05/20/19 1135)  ondansetron (ZOFRAN) injection 4 mg (4 mg Intravenous Given 05/20/19 1135)     ED Discharge Orders    None       Note:  This document was prepared using Dragon voice recognition software and may include unintentional dictation errors.   Vanessa [place], MD 05/20/19 [phone]

## 2019-05-20 NOTE — ED Notes (Signed)
Pt assisted to bathroom

## 2019-05-20 NOTE — ED Notes (Signed)
Pt given phone for MRI screening.

## 2019-05-20 NOTE — TOC Initial Note (Addendum)
Transition of Care [facility]) - Initial/Assessment Note    Patient Details  Name: Veronica Hubbard MRN: [id number] Date of Birth: [birth year]  [facility] CM/SW Contact:    Veronica Garfinkel, RN Phone Number: 05/20/2019, 3:57 PM  Clinical Narrative:                 Patient being brought in under Observation status. She states she is from home alone. She states her sister Veronica Hubbard stays with her sometimes. Patient walks with a walker as needed. She has a wheelchair, walker, and bedside commode at home. PCP Dr. Angelia Mould. Sister Veronica Hubbard provides transportation. She states she is being followed by [facility] home health for physical therapy.  Veronica Hubbard with [facility] updated by emailed. MOON letter delivered to patient.Sister Veronica Hubbard [phone] updated per patient request that patient is being placed in observation bed and to call [phone] for bed status.   Expected Discharge Plan: [place]     Patient Goals and CMS Choice Patient states their goals for this hospitalization and ongoing recovery are:: "I"m planning on going home" CMS Medicare.gov Compare Post Acute Care list provided to:: Patient Choice offered to / list presented to : Patient  Expected Discharge Plan and Services Expected Discharge Plan: [place] Choice: [place]: PT [place]: [place] Date [place]: 05/20/19 Time HH Agency Contacted: 1430 Representative spoke with at [place]: Veronica Hubbard  Prior Living Arrangements/Services   Lives with:: Self   Do you feel safe going back to the place where you live?: Yes          Current home services: Home PT, DME(wheelchair, front-wheeled walker,bedside commode)    Activities of Daily Living      Permission Sought/Granted                  Emotional Assessment              Admission diagnosis:  back  pain ems Patient Active Problem List   Diagnosis Date Noted  . Severe back pain 05/20/2019  . Compression fracture of lumbar spine, non-traumatic ([place]) 05/11/2019  . Intractable pain 04/19/2019  . AAA (abdominal aortic aneurysm) without rupture ([place]) 04/09/2017  . Bilateral carotid artery stenosis 04/09/2017  . Thoracic aortic aneurysm without rupture ([place]) 04/09/2017  . Hyponatremia 02/23/2016  . Chest pain 02/23/2016  . Gastroenteritis due to norovirus 02/09/2016   PCP:  Zhou-Talbert, Veronica Lade, MD Pharmacy:   [facility] [address] [place] [address] Phone: [phone] Fax: [phone]     Social Determinants of Health (SDOH) Interventions    Readmission Risk Interventions No flowsheet data found.

## 2019-05-20 NOTE — ED Notes (Signed)
Patient transported to CT 

## 2019-05-20 NOTE — ED Triage Notes (Signed)
Pt c/o lower back pain.  Pain improved some with surgery but still having low back pain.  Her ortho team told her to come get her back checked and get pain control. No numbness or tingling. No loss of bowel or bladder. No fever.

## 2019-05-20 NOTE — ED Notes (Signed)
ED TO INPATIENT HANDOFF REPORT  ED Nurse Name and Phone #: Kaitlinn Iversen [phone]  S Name/Age/Gender Charolotte Capuchin 84 y.o. female Room/Bed: ED26A/ED26A  Code Status   Code Status: Prior  Home/SNF/Other Home Patient oriented to: self, place, time and situation Is this baseline? Yes   Triage Complete: Triage complete  Chief Complaint back pain ems  Triage Note Pt c/o lower back pain.  Pain improved some with surgery but still having low back pain.  Her ortho team told her to come get her back checked and get pain control. No numbness or tingling. No loss of bowel or bladder. No fever.   Allergies Allergies  Allergen Reactions  . Aspirin Shortness Of Breath  . Celecoxib Shortness Of Breath  . Iodinated Diagnostic Agents Other (See Comments)    Reaction:  Decreased kidney function  . Losartan Itching  . Morphine Nausea And Vomiting  . Verapamil Itching    Level of Care/Admitting Diagnosis ED Disposition    ED Disposition Condition [facility] Area: [place] [[id number]]  Level of Care: Med-Surg [16]  Covid Evaluation: Confirmed COVID Negative  Diagnosis: Severe back pain [250539]  Admitting Physician: Max Sane [[id number]]  Attending Physician: Max Sane [[id number]]  PT Class (Do Not Modify): Observation [104]  PT Acc Code (Do Not Modify): Observation [10022]       B Medical/Surgery History Past Medical History:  Diagnosis Date  . Asthma   . Cancer ([place]) 02/2018   bone marrow cancer  . Depression   . GERD (gastroesophageal reflux disease)   . Hypertension   . Hypothyroidism   . Personal history of chemotherapy   . Spinal stenosis    Past Surgical History:  Procedure Laterality Date  . ABDOMINAL HYSTERECTOMY    . BACK SURGERY    . BREAST BIOPSY Left 2003   neg  . cataracts    . KYPHOPLASTY N/A 04/22/2019   Procedure: KYPHOPLASTY;  Surgeon: Hessie Knows, MD;  Location: [facility] ORS;  Service: Orthopedics;  Laterality:  N/A;  . KYPHOPLASTY N/A 04/10/2019   Procedure: KYPHOPLASTY L3;  Surgeon: Hessie Knows, MD;  Location: [facility] ORS;  Service: Orthopedics;  Laterality: N/A;  . KYPHOPLASTY N/A 05/13/2019   Procedure: KYPHOPLASTY L1 L2;  Surgeon: Hessie Knows, MD;  Location: [facility] ORS;  Service: Orthopedics;  Laterality: N/A;  . lipoma removal    . SHOULDER SURGERY       A IV Location/Drains/Wounds Patient Lines/Drains/Airways Status   Active Line/Drains/Airways    Name:   Placement date:   Placement time:   Site:   Days:   Peripheral IV 05/20/19 Left Wrist   05/20/19    1138    Wrist   less than 1   Incision (Closed) 05/13/19 Back Other (Comment)   05/13/19    1342     7          Intake/Output Last 24 hours No intake or output data in the 24 hours ending 05/20/19 1523  Labs/Imaging Results for orders placed or performed during the hospital encounter of 05/20/19 (from the past 48 hour(s))  CBC with Differential     Status: Abnormal   Collection Time: 05/20/19 11:19 AM  Result Value Ref Range   WBC 23.8 (H) 4.0 - 10.5 K/uL   RBC 4.36 3.87 - 5.11 MIL/uL   Hemoglobin 12.0 12.0 - 15.0 g/dL   HCT 37.4 36.0 - 46.0 %   MCV 85.8 80.0 - 100.0 fL   MCH 27.5  26.0 - 34.0 pg   MCHC 32.1 30.0 - 36.0 g/dL   RDW 20.8 (H) 11.5 - 15.5 %   Platelets 353 150 - 400 K/uL   nRBC 0.3 (H) 0.0 - 0.2 %   Neutrophils Relative % 67 %   Neutro Abs 16.0 (H) 1.7 - 7.7 K/uL   Lymphocytes Relative 10 %   Lymphs Abs 2.4 0.7 - 4.0 K/uL   Monocytes Relative 5 %   Monocytes Absolute 1.1 (H) 0.1 - 1.0 K/uL   Eosinophils Relative 7 %   Eosinophils Absolute 1.8 (H) 0.0 - 0.5 K/uL   Basophils Relative 2 %   Basophils Absolute 0.5 (H) 0.0 - 0.1 K/uL   WBC Morphology MILD LEFT SHIFT (1-5% METAS, OCC MYELO, OCC BANDS)    Smear Review Normal platelet morphology    Immature Granulocytes 9 %   Abs Immature Granulocytes 2.16 (H) 0.00 - 0.07 K/uL   Spherocytes PRESENT     Comment: Performed at [facility], [address] [place], [place] [address]  Basic metabolic panel     Status: Abnormal   Collection Time: 05/20/19 11:19 AM  Result Value Ref Range   Sodium 137 135 - 145 mmol/L   Potassium 3.7 3.5 - 5.1 mmol/L   Chloride 106 98 - 111 mmol/L   CO2 22 22 - 32 mmol/L   Glucose, Bld 120 (H) 70 - 99 mg/dL   BUN 13 8 - 23 mg/dL   Creatinine, Ser 0.97 0.44 - 1.00 mg/dL   Calcium 10.7 (H) 8.9 - 10.3 mg/dL   GFR calc non Af Amer 54 (L) >60 mL/min   GFR calc Af Amer >60 >60 mL/min   Anion gap 9 5 - 15    Comment: Performed at [facility], [address] [place], [place] [address]   Ct Abdomen Pelvis Wo Contrast  Result Date: 05/20/2019 CLINICAL DATA:  Low back pain.  Abdominal pain and distension. EXAM: CT ABDOMEN AND PELVIS WITHOUT CONTRAST TECHNIQUE: Multidetector CT imaging of the abdomen and pelvis was performed following the standard protocol without IV contrast. COMPARISON:  CT scan 05/16/2019 FINDINGS: Lower chest: The lung bases are clear. The heart is normal in size. No pericardial effusion. Stable coronary artery calcifications and aortic calcifications. Hepatobiliary: No focal hepatic lesions or intrahepatic biliary dilatation. The gallbladder is grossly normal. No common bile duct dilatation. Pancreas: No mass, inflammation or ductal dilatation. Spleen: Stable fairly massive splenomegaly. The spleen measures 18.5 x 13.0 x 10.5 cm. No focal splenic lesions. Adrenals/Urinary Tract: The adrenal glands are unremarkable and stable. There are bilateral renal cysts and a stable lower pole right renal calculus measuring 6.5 mm. The bladder is mildly distended and contains a small amount of non dependent air which may be due to recent instrumentation or catheterization. Stomach/Bowel: The stomach, duodenum, small bowel and colon are grossly normal without oral contrast. No inflammatory changes, mass lesions or obstructive findings. The appendix is normal. Vascular/Lymphatic: Stable marked tortuosity, ectasia  and heavy calcification of the abdominal aorta. Stable infrarenal abdominal aortic aneurysm measuring a maximum of 3.9 cm. Fairly extensive branch vessel calcifications also appears stable. No retroperitoneal mass or adenopathy. Reproductive: Surgically absent. Other: No pelvic mass or pelvic adenopathy. No free pelvic fluid collections. No inguinal mass or adenopathy. Musculoskeletal: No acute bony findings. Stable vertebral augmentation changes at L1, L2, L3 and L4. IMPRESSION: 1. No acute abdominal/pelvic findings, mass lesions or adenopathy. No interval change from CT scan 4 days ago. 2. Stable  splenomegaly. 3. Stable advanced vascular disease with 3.9 cm infrarenal abdominal aortic aneurysm. 4. Stable renal cysts and right renal calculus. 5. Small amount of air noted in the bladder, possibly later to recent instrumentation or catheterization. Electronically Signed   By: Marijo Sanes M.D.   On: 05/20/2019 12:19   Ct L-spine No Charge  Result Date: 05/20/2019 CLINICAL DATA:  Back pain. EXAM: CT LUMBAR SPINE WITHOUT CONTRAST TECHNIQUE: Multidetector CT imaging of the lumbar spine was performed without intravenous contrast administration. Multiplanar CT image reconstructions were also generated. COMPARISON:  MRI 05/11/2019 FINDINGS: Segmentation: There are five lumbar type vertebral bodies. The last full intervertebral disc space is labeled L5-S1. This correlates with the prior MRI. Alignment: Stable degenerative anterolisthesis of L3 and L4. Vertebrae: Vertebral augmentation changes noted at L1, L2, L3 and L4. Bone cement cement noted in the epidural spaces at L1, L2 and L4 but no significant canal stenosis. No acute fractures are identified. Stable advanced facet disease with facet fusions most notably at L3-4 and L4-5 Paraspinal and other soft tissues: No significant paraspinal or retroperitoneal findings. Stable advanced aortic calcifications. 3.7 cm infrarenal abdominal aortic aneurysm. A right renal cyst  is stable. Disc levels: No large disc protrusions are identified. There is stable moderate to moderately severe spinal and bilateral lateral recess stenosis at L3-4. IMPRESSION: 1. Vertebral augmentation changes at L1, L2, L3 and L4. Epidural bone cement is noted at multiple levels but no significant canal stenosis. 2. No acute fractures. 3. Stable advanced degenerative lumbar spondylosis with multilevel disc disease and facet disease. Stable appearing moderate to moderately severe spinal and bilateral lateral recess stenosis at L3-4. Electronically Signed   By: Marijo Sanes M.D.   On: 05/20/2019 12:13    Pending Labs Unresulted Labs (From admission, onward)    Start     Ordered   05/20/19 1443  C-reactive protein  Add-on,   AD     05/20/19 1442   05/20/19 1443  Sedimentation rate  Add-on,   AD     05/20/19 1442   Signed and Held  Basic metabolic panel  Tomorrow morning,   R     Signed and Held   Signed and Held  CBC  Tomorrow morning,   R     Signed and Held   Signed and Held  CBC  (enoxaparin (LOVENOX)    CrCl >/= 30 ml/min)  Once,   R    Comments: Baseline for enoxaparin therapy IF NOT ALREADY DRAWN.  Notify MD if PLT < 100 K.    Signed and Held   Signed and Held  Creatinine, serum  (enoxaparin (LOVENOX)    CrCl >/= 30 ml/min)  Once,   R    Comments: Baseline for enoxaparin therapy IF NOT ALREADY DRAWN.    Signed and Held   Signed and Held  Creatinine, serum  (enoxaparin (LOVENOX)    CrCl >/= 30 ml/min)  Weekly,   R    Comments: while on enoxaparin therapy    Signed and Held          Vitals/Pain Today's Vitals   05/20/19 1231 05/20/19 1233 05/20/19 1330 05/20/19 1400  BP:   (!) 118/58 97/65  Pulse:  (!) 59 (!) 57 (!) 56  Resp:  16 16 14   Temp:      TempSrc:      SpO2:  95% 96% 94%  Weight:      Height:      PainSc: 6  Isolation Precautions No active isolations  Medications Medications  fentaNYL (SUBLIMAZE) injection 50 mcg (50 mcg Intravenous Given 05/20/19  1135)  ondansetron (ZOFRAN) injection 4 mg (4 mg Intravenous Given 05/20/19 1135)    Mobility walks with device Low fall risk   Focused Assessments low back since surgery for it 7/5. no fever lives alone. no incontinence. no numbness/tingling.    R Recommendations: See Admitting Provider Note  Report given to:   Additional Notes:

## 2019-05-20 NOTE — H&P (Signed)
[place] [facility] [place] NAME: Amiaya Mcneeley    MR#:  [id number]  DATE OF BIRTH:  08/03/1935  DATE OF ADMISSION:  05/20/2019  PRIMARY CARE PHYSICIAN: Zhou-Talbert, Elwyn Lade, MD   REQUESTING/REFERRING PHYSICIAN: Vanessa Wauregan, MD  CHIEF COMPLAINT:   Chief Complaint  Patient presents with  . Back Pain    HISTORY OF PRESENT ILLNESS:  Raimi Guillermo  is a 84 y.o. female with a known history of multiple compression fractures, S/P L3 Kypho on 6/4, L4 Kypho on 6/16, and L1-L2 Kypho on 7/7. Her sister [name] ([place]) says that pain has gotten much worse.  Patient says since discharge she has had worsening bilateral pelvic pain that radiates into her abdomen.  The pain is severe, intermittent, worse with trying to move around, nothing makes it better.  She does endorses pain in her lower lumbar region.  She says that prior to surgery she was ambulating with a walker and wheelchair however recently she has been more confined to a wheelchair. No bladder/bowel incontinence. PAST MEDICAL HISTORY:   Past Medical History:  Diagnosis Date  . Asthma   . Cancer ([place]) 02/2018   bone marrow cancer  . Depression   . GERD (gastroesophageal reflux disease)   . Hypertension   . Hypothyroidism   . Personal history of chemotherapy   . Spinal stenosis     PAST SURGICAL HISTORY:   Past Surgical History:  Procedure Laterality Date  . ABDOMINAL HYSTERECTOMY    . BACK SURGERY    . BREAST BIOPSY Left 2003   neg  . cataracts    . KYPHOPLASTY N/A 04/22/2019   Procedure: KYPHOPLASTY;  Surgeon: Hessie Knows, MD;  Location: [facility] ORS;  Service: Orthopedics;  Laterality: N/A;  . KYPHOPLASTY N/A 04/10/2019   Procedure: KYPHOPLASTY L3;  Surgeon: Hessie Knows, MD;  Location: [facility] ORS;  Service: Orthopedics;  Laterality: N/A;  . KYPHOPLASTY N/A 05/13/2019   Procedure: KYPHOPLASTY L1 L2;  Surgeon: Hessie Knows, MD;  Location: [facility] ORS;  Service: Orthopedics;   Laterality: N/A;  . lipoma removal    . SHOULDER SURGERY      SOCIAL HISTORY:   Social History   Tobacco Use  . Smoking status: Former Smoker    Types: Cigarettes  . Smokeless tobacco: Never Used  Substance Use Topics  . Alcohol use: No    FAMILY HISTORY:   Family History  Problem Relation Age of Onset  . Leukemia Mother   . CAD Father   . Diabetes Father   . Breast cancer Sister 76    DRUG ALLERGIES:   Allergies  Allergen Reactions  . Aspirin Shortness Of Breath  . Celecoxib Shortness Of Breath  . Iodinated Diagnostic Agents Other (See Comments)    Reaction:  Decreased kidney function  . Losartan Itching  . Morphine Nausea And Vomiting  . Verapamil Itching    REVIEW OF SYSTEMS:   Review of Systems  Constitutional: Negative for chills, fever and weight loss.  HENT: Negative for nosebleeds and sore throat.   Eyes: Negative for blurred vision.  Respiratory: Negative for cough, shortness of breath and wheezing.   Cardiovascular: Negative for chest pain, orthopnea, leg swelling and PND.  Gastrointestinal: Negative for abdominal pain, constipation, diarrhea, heartburn, nausea and vomiting.  Genitourinary: Negative for dysuria and urgency.  Musculoskeletal: Positive for back pain.  Skin: Negative for rash.  Neurological: Negative for dizziness, speech change, focal weakness and headaches.  Endo/Heme/Allergies: Does not bruise/bleed  easily.  Psychiatric/Behavioral: Negative for depression.   MEDICATIONS AT HOME:   Prior to Admission medications   Medication Sig Start Date End Date Taking? Authorizing Provider  acetaminophen (TYLENOL) 325 MG tablet Take 2 tablets (650 mg total) by mouth every 6 (six) hours as needed for mild pain (or Fever >/= 101). 04/27/19  Yes Mayo, Pete Pelt, MD  allopurinol (ZYLOPRIM) 100 MG tablet Take 200 mg by mouth daily. 05/04/18  Yes [provider]  budesonide (PULMICORT) 0.5 MG/2ML nebulizer solution Take 0.5 mg by  nebulization 2 (two) times daily.   Yes [provider]  Cholecalciferol (VITAMIN D3) 25 MCG (1000 UT) CAPS Take 1,000 Units by mouth daily.   Yes [provider]  citalopram (CELEXA) 20 MG tablet Take 20 mg by mouth daily.   Yes [provider]  cyclobenzaprine (FLEXERIL) 5 MG tablet Take 5-10 mg by mouth at bedtime as needed for muscle spasms.   Yes [provider]  enoxaparin (LOVENOX) 30 MG/0.3ML injection Inject 0.3 mLs (30 mg total) into the skin daily for 15 days. 05/20/19 06/04/19 Yes Vaughan Basta, MD  ezetimibe (ZETIA) 10 MG tablet Take 10 mg by mouth at bedtime.    Yes [provider]  feeding supplement, ENSURE ENLIVE, (ENSURE ENLIVE) LIQD Take 237 mLs by mouth 2 (two) times daily between meals. 05/19/19  Yes Vaughan Basta, MD  Fluticasone-Umeclidin-Vilant 100-62.5-25 MCG/INH AEPB Inhale 1 puff into the lungs daily.   Yes [provider]  hydrALAZINE (APRESOLINE) 50 MG tablet Take 1 tablet (50 mg total) by mouth every 8 (eight) hours. 04/27/19  Yes Mayo, Pete Pelt, MD  ipratropium-albuterol (DUONEB) 0.5-2.5 (3) MG/3ML SOLN Take 3 mLs by nebulization 4 (four) times daily as needed.   Yes [provider]  isosorbide mononitrate (IMDUR) 30 MG 24 hr tablet Take 30 mg by mouth daily.   Yes [provider]  levothyroxine (SYNTHROID) 150 MCG tablet Take 1 tablet (150 mcg total) by mouth daily before breakfast. 04/28/19  Yes Mayo, Pete Pelt, MD  Melatonin 5 MG TABS Take 5 mg by mouth at bedtime.   Yes [provider]  metoprolol tartrate (LOPRESSOR) 25 MG tablet Take 1 tablet (25 mg total) by mouth 2 (two) times daily. 05/19/19  Yes Vaughan Basta, MD  oxyCODONE-acetaminophen (PERCOCET) 7.5-325 MG tablet Take 1 tablet by mouth every 6 (six) hours as needed for moderate pain or severe pain. 05/19/19  Yes Vaughan Basta, MD  pantoprazole (PROTONIX) 40 MG tablet Take 40 mg by mouth 2 (two)  times daily.   Yes [provider]  polyethylene glycol (MIRALAX / GLYCOLAX) 17 g packet Take 17 g by mouth daily as needed. 04/27/19  Yes Mayo, Pete Pelt, MD  senna-docusate (SENOKOT-S) 8.6-50 MG tablet Take 1 tablet by mouth 2 (two) times daily. 05/19/19  Yes Vaughan Basta, MD  sertraline (ZOLOFT) 25 MG tablet Take 1 tablet (25 mg total) by mouth daily. 05/20/19  Yes Vaughan Basta, MD  triamcinolone cream (KENALOG) 0.5 % Apply 1 application topically 3 (three) times daily.   Yes [provider]  umeclidinium bromide (INCRUSE ELLIPTA) 62.5 MCG/INH AEPB Inhale 1 puff into the lungs daily. 05/20/19  Yes Vaughan Basta, MD      VITAL SIGNS:  Blood pressure 97/65, pulse (!) 56, temperature 98.2 F (36.8 C), temperature source Oral, resp. rate 14, height 5\' 3"  (1.6 m), weight 59 kg, SpO2 94 %.  PHYSICAL EXAMINATION:  Physical Exam  GENERAL:  84 y.o.-year-old patient lying in the  bed with no acute distress.  EYES: Pupils equal, round, reactive to light and accommodation. No scleral icterus. Extraocular muscles intact.  HEENT: Head atraumatic, normocephalic. Oropharynx and nasopharynx clear.  NECK:  Supple, no jugular venous distention. No thyroid enlargement, no tenderness.  LUNGS: Normal breath sounds bilaterally, no wheezing, rales,rhonchi or crepitation. No use of accessory muscles of respiration.  CARDIOVASCULAR: S1, S2 normal. No murmurs, rubs, or gallops.  ABDOMEN: Soft, nontender, nondistended. Bowel sounds present. No organomegaly or mass.  EXTREMITIES: No pedal edema, cyanosis, or clubbing.  NEUROLOGIC: Cranial nerves II through XII are intact. Muscle strength 5/5 in all extremities. Sensation intact. Gait not checked.  PSYCHIATRIC: The patient is alert and oriented x 3.  SKIN: No obvious rash, lesion, or ulcer.   LABORATORY PANEL:   CBC Recent Labs  Lab 05/20/19 1119  WBC 23.8*  HGB 12.0  HCT 37.4  PLT 353    ------------------------------------------------------------------------------------------------------------------  Chemistries  Recent Labs  Lab 05/20/19 1119  NA 137  K 3.7  CL 106  CO2 22  GLUCOSE 120*  BUN 13  CREATININE 0.97  CALCIUM 10.7*   ------------------------------------------------------------------------------------------------------------------  Cardiac Enzymes No results for input(s): TROPONINI in the last 168 hours. ------------------------------------------------------------------------------------------------------------------  RADIOLOGY:  Ct Abdomen Pelvis Wo Contrast  Result Date: 05/20/2019 CLINICAL DATA:  Low back pain.  Abdominal pain and distension. EXAM: CT ABDOMEN AND PELVIS WITHOUT CONTRAST TECHNIQUE: Multidetector CT imaging of the abdomen and pelvis was performed following the standard protocol without IV contrast. COMPARISON:  CT scan 05/16/2019 FINDINGS: Lower chest: The lung bases are clear. The heart is normal in size. No pericardial effusion. Stable coronary artery calcifications and aortic calcifications. Hepatobiliary: No focal hepatic lesions or intrahepatic biliary dilatation. The gallbladder is grossly normal. No common bile duct dilatation. Pancreas: No mass, inflammation or ductal dilatation. Spleen: Stable fairly massive splenomegaly. The spleen measures 18.5 x 13.0 x 10.5 cm. No focal splenic lesions. Adrenals/Urinary Tract: The adrenal glands are unremarkable and stable. There are bilateral renal cysts and a stable lower pole right renal calculus measuring 6.5 mm. The bladder is mildly distended and contains a small amount of non dependent air which may be due to recent instrumentation or catheterization. Stomach/Bowel: The stomach, duodenum, small bowel and colon are grossly normal without oral contrast. No inflammatory changes, mass lesions or obstructive findings. The appendix is normal. Vascular/Lymphatic: Stable marked tortuosity, ectasia and  heavy calcification of the abdominal aorta. Stable infrarenal abdominal aortic aneurysm measuring a maximum of 3.9 cm. Fairly extensive branch vessel calcifications also appears stable. No retroperitoneal mass or adenopathy. Reproductive: Surgically absent. Other: No pelvic mass or pelvic adenopathy. No free pelvic fluid collections. No inguinal mass or adenopathy. Musculoskeletal: No acute bony findings. Stable vertebral augmentation changes at L1, L2, L3 and L4. IMPRESSION: 1. No acute abdominal/pelvic findings, mass lesions or adenopathy. No interval change from CT scan 4 days ago. 2. Stable splenomegaly. 3. Stable advanced vascular disease with 3.9 cm infrarenal abdominal aortic aneurysm. 4. Stable renal cysts and right renal calculus. 5. Small amount of air noted in the bladder, possibly later to recent instrumentation or catheterization. Electronically Signed   By: Marijo Sanes M.D.   On: 05/20/2019 12:19   Ct L-spine No Charge  Result Date: 05/20/2019 CLINICAL DATA:  Back pain. EXAM: CT LUMBAR SPINE WITHOUT CONTRAST TECHNIQUE: Multidetector CT imaging of the lumbar spine was performed without intravenous contrast administration. Multiplanar CT image reconstructions were also generated. COMPARISON:  MRI 05/11/2019 FINDINGS: Segmentation: There are five lumbar type  vertebral bodies. The last full intervertebral disc space is labeled L5-S1. This correlates with the prior MRI. Alignment: Stable degenerative anterolisthesis of L3 and L4. Vertebrae: Vertebral augmentation changes noted at L1, L2, L3 and L4. Bone cement cement noted in the epidural spaces at L1, L2 and L4 but no significant canal stenosis. No acute fractures are identified. Stable advanced facet disease with facet fusions most notably at L3-4 and L4-5 Paraspinal and other soft tissues: No significant paraspinal or retroperitoneal findings. Stable advanced aortic calcifications. 3.7 cm infrarenal abdominal aortic aneurysm. A right renal cyst is  stable. Disc levels: No large disc protrusions are identified. There is stable moderate to moderately severe spinal and bilateral lateral recess stenosis at L3-4. IMPRESSION: 1. Vertebral augmentation changes at L1, L2, L3 and L4. Epidural bone cement is noted at multiple levels but no significant canal stenosis. 2. No acute fractures. 3. Stable advanced degenerative lumbar spondylosis with multilevel disc disease and facet disease. Stable appearing moderate to moderately severe spinal and bilateral lateral recess stenosis at L3-4. Electronically Signed   By: Marijo Sanes M.D.   On: 05/20/2019 12:13   IMPRESSION AND PLAN:  Patient is an 84 year old female witha known history of hypertension,hypothyroidism,chronic leukocytosis secondary tohistory of MDS/MPN and was on Jakafi in the pastand recent discharge from the hospital following kyphoplasty for L4 compression fracture begin readmitted for worsening back pain.   1.Back pain  - s/p kyphoplasty for L4 compression fracture. Back with worsening back pains. - CT lumbar spine and abd-pelvis not showing any acute patho - pending MRI of the lumbar spine  -L1-L2 kyphoplasty done 05/13/19.  2.Hypertension - monitor  3.Hypothyroidism. Resume Synthroid. TSH level was in therapeutic range last time  4.History of depression Stable. Continue Celexa.  5.History of asthma Stable.    Patient was suggested by physical therapy to go to rehab last admission but she was persistent that she would like to go home with heat treatments  I d/w her sister Thersa Salt over phone and she feels patient may have to go to STR/SNF this time.  I've requested palliative care c/s   All the records are reviewed and case discussed with ED provider. Management plans discussed with the patient, family (sister over phone) and they are in agreement.  CODE STATUS: FULL CODE   TOTAL TIME TAKING CARE OF THIS PATIENT: 45 minutes.    Max Sane M.D  on 05/20/2019 at 3:28 PM  Between 7am to 6pm - Pager - [phone]  After 6pm go to [email or web address] - Proofreader  [facility]  Office  [phone]  CC: Primary care physician; Zhou-Talbert, Elwyn Lade, MD   Note: This dictation was prepared with Dragon dictation along with smaller phrase technology. Any transcriptional errors that result from this process are unintentional.

## 2019-05-20 NOTE — ED Notes (Signed)
Informed chris RN pt will go to MRI then to floor.

## 2019-05-20 NOTE — Care Management Obs Status (Signed)
[place] NOTIFICATION   Patient Details  Name: Veronica Hubbard MRN: [id number] Date of Birth: 29-Jun-1935   Medicare Observation Status Notification Given:  Yes    Marshell Garfinkel, RN 05/20/2019, 4:06 PM

## 2019-05-21 DIAGNOSIS — D469 Myelodysplastic syndrome, unspecified: Secondary | ICD-10-CM

## 2019-05-21 DIAGNOSIS — Z8781 Personal history of (healed) traumatic fracture: Secondary | ICD-10-CM

## 2019-05-21 DIAGNOSIS — Z515 Encounter for palliative care: Secondary | ICD-10-CM | POA: Diagnosis not present

## 2019-05-21 LAB — CBC
HCT: 36.2 % (ref 36.0–46.0)
Hemoglobin: 11.2 g/dL — ABNORMAL LOW (ref 12.0–15.0)
MCH: 26.9 pg (ref 26.0–34.0)
MCHC: 30.9 g/dL (ref 30.0–36.0)
MCV: 86.8 fL (ref 80.0–100.0)
Platelets: 331 10*3/uL (ref 150–400)
RBC: 4.17 MIL/uL (ref 3.87–5.11)
RDW: 20.9 % — ABNORMAL HIGH (ref 11.5–15.5)
WBC: 20.3 10*3/uL — ABNORMAL HIGH (ref 4.0–10.5)
nRBC: 0.2 % (ref 0.0–0.2)

## 2019-05-21 LAB — BASIC METABOLIC PANEL
Anion gap: 5 (ref 5–15)
BUN: 15 mg/dL (ref 8–23)
CO2: 25 mmol/L (ref 22–32)
Calcium: 10.6 mg/dL — ABNORMAL HIGH (ref 8.9–10.3)
Chloride: 107 mmol/L (ref 98–111)
Creatinine, Ser: 1.18 mg/dL — ABNORMAL HIGH (ref 0.44–1.00)
GFR calc Af Amer: 50 mL/min — ABNORMAL LOW (ref >60)
GFR calc non Af Amer: 43 mL/min — ABNORMAL LOW (ref >60)
Glucose, Bld: 97 mg/dL (ref 70–99)
Potassium: 3.8 mmol/L (ref 3.5–5.1)
Sodium: 137 mmol/L (ref 135–145)

## 2019-05-21 LAB — GLUCOSE, CAPILLARY: Glucose-Capillary: 91 mg/dL (ref 70–99)

## 2019-05-21 MED ORDER — LIDOCAINE 5 % EX PTCH
2.0000 | MEDICATED_PATCH | Freq: Every day | CUTANEOUS | Status: DC
  Administered 2019-05-21 – 2019-05-26 (×6): 2 via TRANSDERMAL
  Filled 2019-05-21 (×6): qty 2

## 2019-05-21 MED ORDER — ACETAMINOPHEN 325 MG PO TABS
650.0000 mg | ORAL_TABLET | Freq: Four times a day (QID) | ORAL | Status: DC
  Administered 2019-05-21 – 2019-05-24 (×9): 650 mg via ORAL
  Filled 2019-05-21 (×10): qty 2

## 2019-05-21 MED ORDER — PHENYLEPHRINE HCL 0.5 % NA SOLN
1.0000 [drp] | Freq: Four times a day (QID) | NASAL | Status: AC | PRN
  Administered 2019-05-21 (×3): 1 [drp] via NASAL
  Filled 2019-05-21: qty 15

## 2019-05-21 MED ORDER — OXYCODONE-ACETAMINOPHEN 7.5-325 MG PO TABS
1.0000 | ORAL_TABLET | Freq: Once | ORAL | Status: AC
  Administered 2019-05-21: 23:00:00 1 via ORAL
  Filled 2019-05-21: qty 1

## 2019-05-21 MED ORDER — CALCITONIN (SALMON) 200 UNIT/ACT NA SOLN
1.0000 | Freq: Every day | NASAL | Status: DC
  Administered 2019-05-21 – 2019-05-26 (×6): 1 via NASAL
  Filled 2019-05-21: qty 3.7

## 2019-05-21 NOTE — Progress Notes (Signed)
OT Cancellation Note  Patient Details Name: Veronica Hubbard MRN: [id number] DOB: 07/28/1935   Cancelled Treatment:    Reason Eval/Treat Not Completed: Pain limiting ability to participate;Other (comment);Medical issues which prohibited therapy. Thank you for the OT consult. Order received and chart reviewed. Upon arrival to pt room, pt seated on BSC attempting BM. Pt stated she was in 10/10 pain throughout her abdomen, back, and sides.. OT offered to assist pt back to chair and pt declined, stating she would just have an accident once back to chair or bed. OT encouraged pt to limit extended periods of time on [facility] as it is not good for skin integrity or positioning. Pt verbalized understanding but declined to move to chair. Pt experienced 3 episodes of emesis while this author was in room. RN aware. Pt also noted to have bloody tissue and rags in front of her on room tray. Per pt and RN pt has had bloody nose. Will hold OT eval until pt able to participate in session. Will follow acutely and re-attempt at a later time/date as available and pt medically appropriate for OT.  Shara Blazing, M.S., OTR/L Ascom: [phone] 05/21/19, 1:47 PM

## 2019-05-21 NOTE — Consult Note (Addendum)
Consultation Note Date: 05/21/2019   Patient Name: Veronica Hubbard  DOB: 09-30-1935  MRN: [id number]  Age / Sex: 84 y.o., female  PCP: Zhou-Talbert, Elwyn Lade, MD Referring Physician: Max Sane, MD  Reason for Consultation: Establishing goals of care  HPI/Patient Profile: 84 y.o. female  with past medical history of compression fractures s/p kyphoplasty 6/4, 6/16, 7/7 with recent stay in rehab, discharged home with her sister, MDS (per Dr. Grayland Ormond consult 05/17/19- previously treated with Rosana Berger with persistent leucocytosis, not requiring treatment at this time, plan for f/u with hematologist in [place], [place]), asthma, HTN admitted on 05/20/2019 with increasing back pain. MRI and CT scan show no acute process. Per ortho no findings for acute cause of pain. Ortho and anesthesia have been consulted for possible epidural injection. Palliative medicine consulted for symptom management and GOC.   Clinical Assessment and Goals of Care: Chart was reviewed thoroughly and report received from RN. Patient currently uncomfortable due to nosebleed and vomiting.  Evaluated patient. She was in bed, with acute nose bleeding (CBC shows stable platelets), vomiting. Had just received dose of ondansetron.  Chart review shows patient was recommended to d/c to SNF at last discharge, however, she declined and shows to go home and was readmitted less than 24 hours. Unfortunately, patient was too uncomfortable for Independence discussion at this time.    Primary Decision Maker PATIENT   SUMMARY OF RECOMMENDATIONS -Lidocaine patches to back for pain- leave on for 12 hours, remove for 12 hours, repeat daily -Scheduled tylenol 650mg  q6hr po -Recommend ENT consult for nosebleed- per RN note patient has had sore in her nose for over one year with bleeding -Calcitonin salmon spray alternating nares 1 spray daily for compression fracture pain  -PMT will visit patient at a later time for primary advanced care planning and support    Code Status/Advance Care Planning:  Full code  Palliative Prophylaxis:   Frequent Pain Assessment  Additional Recommendations (Limitations, Scope, Preferences):  Full Scope Treatment  Prognosis:    Unable to determine  Discharge Planning: To Be Determined  Primary Diagnoses: Present on Admission: . Severe back pain   I have reviewed the medical record, interviewed the patient and family, and examined the patient. The following aspects are pertinent.  Past Medical History:  Diagnosis Date  . Asthma   . Cancer ([place]) 02/2018   bone marrow cancer  . Depression   . GERD (gastroesophageal reflux disease)   . Hypertension   . Hypothyroidism   . Personal history of chemotherapy   . Spinal stenosis    Social History   Socioeconomic History  . Marital status: Single    Spouse name: Not on file  . Number of children: Not on file  . Years of education: Not on file  . Highest education level: Not on file  Occupational History  . Not on file  Social Needs  . Financial resource strain: Not on file  . Food insecurity    Worry: Not on file    Inability: Not  on file  . Transportation needs    Medical: Not on file    Non-medical: Not on file  Tobacco Use  . Smoking status: Former Smoker    Types: Cigarettes  . Smokeless tobacco: Never Used  Substance and Sexual Activity  . Alcohol use: No  . Drug use: No  . Sexual activity: Not on file  Lifestyle  . Physical activity    Days per week: Not on file    Minutes per session: Not on file  . Stress: Not on file  Relationships  . Social Herbalist on phone: Not on file    Gets together: Not on file    Attends religious service: Not on file    Active member of club or organization: Not on file    Attends meetings of clubs or organizations: Not on file    Relationship status: Not on file  Other Topics Concern  . Not  on file  Social History Narrative  . Not on file   Family History  Problem Relation Age of Onset  . Leukemia Mother   . CAD Father   . Diabetes Father   . Breast cancer Sister 66   Scheduled Meds: . allopurinol  200 mg Oral Daily  . arformoterol  15 mcg Nebulization BID  . budesonide  0.5 mg Nebulization BID  . cholecalciferol  1,000 Units Oral Daily  . citalopram  20 mg Oral Daily  . docusate sodium  100 mg Oral BID  . ezetimibe  10 mg Oral QHS  . feeding supplement (ENSURE ENLIVE)  237 mL Oral BID BM  . hydrALAZINE  50 mg Oral Q8H  . isosorbide mononitrate  30 mg Oral Daily  . levothyroxine  150 mcg Oral QAC breakfast  . Melatonin  5 mg Oral QHS  . metoprolol tartrate  25 mg Oral BID  . pantoprazole  40 mg Oral BID  . senna-docusate  1 tablet Oral BID  . sertraline  25 mg Oral Daily  . sodium chloride flush  3 mL Intravenous Q12H  . umeclidinium bromide  1 puff Inhalation Daily   Continuous Infusions: . sodium chloride     PRN Meds:.sodium chloride, acetaminophen **OR** acetaminophen, acetaminophen, bisacodyl, cyclobenzaprine, ondansetron **OR** ondansetron (ZOFRAN) IV, phenylephrine, polyethylene glycol, sodium chloride flush, traZODone Medications Prior to Admission:  Prior to Admission medications   Medication Sig Start Date End Date Taking? Authorizing Provider  acetaminophen (TYLENOL) 325 MG tablet Take 2 tablets (650 mg total) by mouth every 6 (six) hours as needed for mild pain (or Fever >/= 101). 04/27/19  Yes Mayo, Pete Pelt, MD  allopurinol (ZYLOPRIM) 100 MG tablet Take 200 mg by mouth daily. 05/04/18  Yes [provider]  budesonide (PULMICORT) 0.5 MG/2ML nebulizer solution Take 0.5 mg by nebulization 2 (two) times daily.   Yes [provider]  Cholecalciferol (VITAMIN D3) 25 MCG (1000 UT) CAPS Take 1,000 Units by mouth daily.   Yes [provider]  citalopram (CELEXA) 20 MG tablet Take 20 mg by mouth daily.   Yes [provider]  cyclobenzaprine (FLEXERIL) 5 MG tablet Take 5-10 mg by mouth at bedtime as needed for muscle spasms.   Yes [provider]  enoxaparin (LOVENOX) 30 MG/0.3ML injection Inject 0.3 mLs (30 mg total) into the skin daily for 15 days. 05/20/19 06/04/19 Yes Vaughan Basta, MD  ezetimibe (ZETIA) 10 MG tablet Take 10 mg by mouth at bedtime.    Yes [provider]  feeding supplement, ENSURE ENLIVE, (ENSURE ENLIVE) LIQD Take 237 mLs by mouth 2 (two) times daily between meals. 05/19/19  Yes Vaughan Basta, MD  Fluticasone-Umeclidin-Vilant 100-62.5-25 MCG/INH AEPB Inhale 1 puff into the lungs daily.   Yes [provider]  hydrALAZINE (APRESOLINE) 50 MG tablet Take 1 tablet (50 mg total) by mouth every 8 (eight) hours. 04/27/19  Yes Mayo, Pete Pelt, MD  ipratropium-albuterol (DUONEB) 0.5-2.5 (3) MG/3ML SOLN Take 3 mLs by nebulization 4 (four) times daily as needed.   Yes [provider]  isosorbide mononitrate (IMDUR) 30 MG 24 hr tablet Take 30 mg by mouth daily.   Yes [provider]  levothyroxine (SYNTHROID) 150 MCG tablet Take 1 tablet (150 mcg total) by mouth daily before breakfast. 04/28/19  Yes Mayo, Pete Pelt, MD  Melatonin 5 MG TABS Take 5 mg by mouth at bedtime.   Yes [provider]  metoprolol tartrate (LOPRESSOR) 25 MG tablet Take 1 tablet (25 mg total) by mouth 2 (two) times daily. 05/19/19  Yes Vaughan Basta, MD  oxyCODONE-acetaminophen (PERCOCET) 7.5-325 MG tablet Take 1 tablet by mouth every 6 (six) hours as needed for moderate pain or severe pain. 05/19/19  Yes Vaughan Basta, MD  pantoprazole (PROTONIX) 40 MG tablet Take 40 mg by mouth 2 (two) times daily.   Yes [provider]  polyethylene glycol (MIRALAX / GLYCOLAX) 17 g packet Take 17 g by mouth daily as needed. 04/27/19  Yes Mayo, Pete Pelt, MD  senna-docusate (SENOKOT-S) 8.6-50 MG tablet Take 1 tablet by mouth 2 (two) times daily. 05/19/19  Yes  Vaughan Basta, MD  sertraline (ZOLOFT) 25 MG tablet Take 1 tablet (25 mg total) by mouth daily. 05/20/19  Yes Vaughan Basta, MD  triamcinolone cream (KENALOG) 0.5 % Apply 1 application topically 3 (three) times daily.   Yes [provider]  umeclidinium bromide (INCRUSE ELLIPTA) 62.5 MCG/INH AEPB Inhale 1 puff into the lungs daily. 05/20/19  Yes Vaughan Basta, MD   Allergies  Allergen Reactions  . Aspirin Shortness Of Breath  . Celecoxib Shortness Of Breath  . Iodinated Diagnostic Agents Other (See Comments)    Reaction:  Decreased kidney function  . Losartan Itching  . Morphine Nausea And Vomiting  . Verapamil Itching   Review of Systems  Gastrointestinal: Positive for vomiting.    Physical Exam Vitals signs and nursing note reviewed.  HENT:     Nose:     Comments: Bleeding dark red blood Skin:    General: Skin is warm and dry.  Neurological:     Mental Status: She is alert and oriented to person, place, and time.     Vital Signs: BP (!) 115/101 (BP Location: Left Arm)   Pulse 68   Temp 98 F (36.7 C) (Oral)   Resp 15   Ht 5\' 3"  (1.6 m)   Wt 62.1 kg   SpO2 97%   BMI 24.27 kg/m  Pain Scale: 0-10 POSS *See Group Information*: S-Acceptable,Sleep, easy to arouse Pain Score: 10-Worst pain ever   SpO2: SpO2: 97 % O2 Device:SpO2: 97 % O2 Flow Rate: .   IO: Intake/output summary: No intake or output data in the 24 hours ending 05/21/19 1317  LBM: Last BM Date: 05/19/19 Baseline Weight: Weight: 59 kg Most recent weight: Weight: 62.1 kg     Palliative Assessment/Data: PPS: 50%     Thank you for this consult. Palliative medicine will continue to follow and assist as needed.   Time In: 1210 Time Out: 1320  Time Total:  70 mins Greater than 50%  of this time was spent counseling and coordinating care related to the above assessment and plan.  Signed by: Mariana Kaufman, AGNP-C Palliative Medicine    Please contact Palliative  Medicine Team phone at [phone] for questions and concerns.  For individual provider: See Shea Evans

## 2019-05-21 NOTE — Evaluation (Addendum)
Physical Therapy Evaluation Patient Details Name: Veronica Hubbard MRN: [id number] DOB: 05-May-1935 Today's Date: 05/21/2019   History of Present Illness  Pt admitted for severe back pain. History includes recent compression fracture with kyphos on 6/4 (L3), 6/16 (L3/L4), 7/7 (L1-L2). Since discharge, pt reports increased pain with all mobility and has been utilized the [facility] for primary mobility. Pt with ortho reporting possible steriod injection for pain relief. Of note, pt reports she didn't bring her back brace from home.  Clinical Impression  Pt is a pleasant 84 year old female who was admitted for severe back pain. No deformity noted on imaging. Pt performs bed mobility, transfers, and ambulation with min assist and RW. Of note, no brace in room. Reminded pt of back precautions. During eval, pt keeps saying, "hold me, hold me! I can't do this".  Very anxious for mobility and limited by pain. Of note, pt has nose bleed this date and coughed up blood. Pt demonstrates deficits with strength/mobility/pain. Currently doesn't have enough support at home to live independently, not at baseline level. Would benefit from skilled PT to address above deficits and promote optimal return to PLOF; recommend transition to STR upon discharge from acute hospitalization.     Follow Up Recommendations SNF    Equipment Recommendations       Recommendations for Other Services       Precautions / Restrictions Precautions Precautions: Fall;Back Required Braces or Orthoses: Spinal Brace Spinal Brace: Lumbar corset Restrictions Weight Bearing Restrictions: Yes RLE Weight Bearing: Weight bearing as tolerated LLE Weight Bearing: Weight bearing as tolerated      Mobility  Bed Mobility Overal bed mobility: Needs Assistance Bed Mobility: Supine to Sit     Supine to sit: Min assist     General bed mobility comments: needs assist for lowering B LEs to floor. Once seated, able to demonstrate upright  posture  Transfers Overall transfer level: Needs assistance Equipment used: Rolling walker (2 wheeled) Transfers: Sit to/from Stand Sit to Stand: Min assist         General transfer comment: needs assist for standing and cues for pushing from seated position.   Ambulation/Gait Ambulation/Gait assistance: Min assist Gait Distance (Feet): 40 Feet Assistive device: Rolling walker (2 wheeled) Gait Pattern/deviations: Step-to pattern;Decreased step length - right;Decreased step length - left     General Gait Details: very slow cautious steps in room. Forward flexed posture and needs 2 standing rest breaks. Cues for RW management  Stairs            Wheelchair Mobility    Modified Rankin (Stroke Patients Only)       Balance Overall balance assessment: Needs assistance Sitting-balance support: Feet supported Sitting balance-Leahy Scale: Good     Standing balance support: Bilateral upper extremity supported Standing balance-Leahy Scale: Fair                               Pertinent Vitals/Pain Pain Assessment: 0-10 Pain Score: 7  Pain Location: low back Pain Descriptors / Indicators: Constant Pain Intervention(s): Limited activity within patient's tolerance    Home Living Family/patient expects to be discharged to:: Private residence Living Arrangements: Alone Available Help at Discharge: Family Type of Home: House Home Access: Ramped entrance     Home Layout: One level Home Equipment: Environmental consultant - 2 wheels;Cane - single point;Bedside commode;Shower seat;Grab bars - toilet;Hand held shower head;Transport chair;Wheelchair - manual Additional Comments: At baseline, lives alone in  single-story home with rampted entrance; sister provides check in/assist as needed    Prior Function Level of Independence: Independent with assistive device(s)         Comments: has been using WC for all mobility needs recently     Hand Dominance         Extremity/Trunk Assessment   Upper Extremity Assessment Upper Extremity Assessment: Overall WFL for tasks assessed    Lower Extremity Assessment Lower Extremity Assessment: Generalized weakness(B LE grossly 4/5)       Communication   Communication: No difficulties  Cognition Arousal/Alertness: Awake/alert Behavior During Therapy: WFL for tasks assessed/performed;Impulsive Overall Cognitive Status: Within Functional Limits for tasks assessed                                        General Comments      Exercises Other Exercises Other Exercises: ambulated to bathroom. Needs min assist for set up and hygiene. Unsteady. Constantly leans forward onto RW due to complaints of abdominal pain. Other Exercises: seated ther-ex performed including LAQ, hip abd/add, AP, alt. marching. 10 reps with cga and safe technique. Reports hip pain with ther-ex.   Assessment/Plan    PT Assessment Patient needs continued PT services  PT Problem List Decreased strength;Decreased range of motion;Decreased activity tolerance;Decreased balance;Decreased mobility;Decreased knowledge of use of DME;Decreased safety awareness;Pain;Decreased knowledge of precautions;Decreased cognition       PT Treatment Interventions Gait training;DME instruction;Functional mobility training;Therapeutic activities;Therapeutic exercise;Balance training;Neuromuscular re-education;Patient/family education    PT Goals (Current goals can be found in the Care Plan section)  Acute Rehab PT Goals Patient Stated Goal: to make pain better PT Goal Formulation: With patient Time For Goal Achievement: 06/04/19 Potential to Achieve Goals: Fair    Frequency Min 2X/week   Barriers to discharge Decreased caregiver support      Co-evaluation               AM-PAC PT "6 Clicks" Mobility  Outcome Measure Help needed turning from your back to your side while in a flat bed without using bedrails?: A Little Help  needed moving from lying on your back to sitting on the side of a flat bed without using bedrails?: A Little Help needed moving to and from a bed to a chair (including a wheelchair)?: A Little Help needed standing up from a chair using your arms (e.g., wheelchair or bedside chair)?: A Little Help needed to walk in hospital room?: A Lot Help needed climbing 3-5 steps with a railing? : A Lot 6 Click Score: 16    End of Session Equipment Utilized During Treatment: Gait belt Activity Tolerance: Patient limited by pain Patient left: in chair;with call bell/phone within reach;with chair alarm set Nurse Communication: Mobility status(pt requesting stool softner) PT Visit Diagnosis: Muscle weakness (generalized) (M62.81);Difficulty in walking, not elsewhere classified (R26.2);Pain Pain - Right/Left: Left Pain - part of body: Hip    Time: 3557-3220 PT Time Calculation (min) (ACUTE ONLY): 36 min   Charges:     PT Treatments $Therapeutic Exercise: 8-22 mins $Therapeutic Activity: 8-22 mins        Greggory Stallion, PT, DPT [phone]   Aceton Kinnear 05/21/2019, 2:20 PM

## 2019-05-21 NOTE — Progress Notes (Signed)
Patient has had for levels of kyphoplasty recently for compression fractures not having severe low back pain pain to towards the left hip.  I reviewed her new MRI and other than stenosis in its anything that would be causing this pain other than her prior fractures in the stenosis with facet arthritis degenerative disc disease but no acute process.  On her MRI did not see anything that would be causing her pain but final report is not yet done.  Patient points to the lumbosacral area as the area of most intense pain.  If MRI of the pelvis is essentially normal would recommend seeing if she could have an epidural steroid shot for presumed stenosis.

## 2019-05-21 NOTE — Progress Notes (Signed)
Pt c/o nose bleed. States sore in nare x 1 year. MD. Pressure maintained. Bleeding stopped. MD made aware, provided order for nasal spray PRN.

## 2019-05-21 NOTE — Progress Notes (Signed)
[facility] [place] NAME: Veronica Hubbard    MR#:  [id number]  DATE OF BIRTH:  09-Apr-1935  SUBJECTIVE:  CHIEF COMPLAINT:   Chief Complaint  Patient presents with   Back Pain  Not having that severe back pain today, wants to eat REVIEW OF SYSTEMS:  Review of Systems  Constitutional: Negative for diaphoresis, fever, malaise/fatigue and weight loss.  HENT: Negative for ear discharge, ear pain, hearing loss, nosebleeds, sore throat and tinnitus.   Eyes: Negative for blurred vision and pain.  Respiratory: Negative for cough, hemoptysis, shortness of breath and wheezing.   Cardiovascular: Negative for chest pain, palpitations, orthopnea and leg swelling.  Gastrointestinal: Negative for abdominal pain, blood in stool, constipation, diarrhea, heartburn, nausea and vomiting.  Genitourinary: Negative for dysuria, frequency and urgency.  Musculoskeletal: Positive for back pain. Negative for myalgias.  Skin: Negative for itching and rash.  Neurological: Negative for dizziness, tingling, tremors, focal weakness, seizures, weakness and headaches.  Psychiatric/Behavioral: Negative for depression. The patient is not nervous/anxious.     DRUG ALLERGIES:   Allergies  Allergen Reactions   Aspirin Shortness Of Breath   Celecoxib Shortness Of Breath   Iodinated Diagnostic Agents Other (See Comments)    Reaction:  Decreased kidney function   Losartan Itching   Morphine Nausea And Vomiting   Verapamil Itching   VITALS:  Blood pressure (!) 115/101, pulse 68, temperature 98 F (36.7 C), temperature source Oral, resp. rate 15, height 5\' 3"  (1.6 m), weight 62.1 kg, SpO2 97 %. PHYSICAL EXAMINATION:  Physical Exam HENT:     Head: Normocephalic and atraumatic.  Eyes:     Conjunctiva/sclera: Conjunctivae normal.     Pupils: Pupils are equal, round, and reactive to light.  Neck:     Musculoskeletal: Normal range of motion and neck  supple.     Thyroid: No thyromegaly.     Trachea: No tracheal deviation.  Cardiovascular:     Rate and Rhythm: Normal rate and regular rhythm.     Heart sounds: Normal heart sounds.  Pulmonary:     Effort: Pulmonary effort is normal. No respiratory distress.     Breath sounds: Normal breath sounds. No wheezing.  Chest:     Chest wall: No tenderness.  Abdominal:     General: Bowel sounds are normal. There is no distension.     Palpations: Abdomen is soft.     Tenderness: There is no abdominal tenderness.  Musculoskeletal: Normal range of motion.  Skin:    General: Skin is warm and dry.     Findings: No rash.  Neurological:     Mental Status: She is alert and oriented to person, place, and time.     Cranial Nerves: No cranial nerve deficit.    LABORATORY PANEL:  Female CBC Recent Labs  Lab 05/21/19 0615  WBC 20.3*  HGB 11.2*  HCT 36.2  PLT 331   ------------------------------------------------------------------------------------------------------------------ Chemistries  Recent Labs  Lab 05/21/19 0615  NA 137  K 3.8  CL 107  CO2 25  GLUCOSE 97  BUN 15  CREATININE 1.18*  CALCIUM 10.6*   RADIOLOGY:  Ct Abdomen Pelvis Wo Contrast  Result Date: 05/20/2019 CLINICAL DATA:  Low back pain.  Abdominal pain and distension. EXAM: CT ABDOMEN AND PELVIS WITHOUT CONTRAST TECHNIQUE: Multidetector CT imaging of the abdomen and pelvis was performed following the standard protocol without IV contrast. COMPARISON:  CT scan 05/16/2019 FINDINGS: Lower  chest: The lung bases are clear. The heart is normal in size. No pericardial effusion. Stable coronary artery calcifications and aortic calcifications. Hepatobiliary: No focal hepatic lesions or intrahepatic biliary dilatation. The gallbladder is grossly normal. No common bile duct dilatation. Pancreas: No mass, inflammation or ductal dilatation. Spleen: Stable fairly massive splenomegaly. The spleen measures 18.5 x 13.0 x 10.5 cm. No focal  splenic lesions. Adrenals/Urinary Tract: The adrenal glands are unremarkable and stable. There are bilateral renal cysts and a stable lower pole right renal calculus measuring 6.5 mm. The bladder is mildly distended and contains a small amount of non dependent air which may be due to recent instrumentation or catheterization. Stomach/Bowel: The stomach, duodenum, small bowel and colon are grossly normal without oral contrast. No inflammatory changes, mass lesions or obstructive findings. The appendix is normal. Vascular/Lymphatic: Stable marked tortuosity, ectasia and heavy calcification of the abdominal aorta. Stable infrarenal abdominal aortic aneurysm measuring a maximum of 3.9 cm. Fairly extensive branch vessel calcifications also appears stable. No retroperitoneal mass or adenopathy. Reproductive: Surgically absent. Other: No pelvic mass or pelvic adenopathy. No free pelvic fluid collections. No inguinal mass or adenopathy. Musculoskeletal: No acute bony findings. Stable vertebral augmentation changes at L1, L2, L3 and L4. IMPRESSION: 1. No acute abdominal/pelvic findings, mass lesions or adenopathy. No interval change from CT scan 4 days ago. 2. Stable splenomegaly. 3. Stable advanced vascular disease with 3.9 cm infrarenal abdominal aortic aneurysm. 4. Stable renal cysts and right renal calculus. 5. Small amount of air noted in the bladder, possibly later to recent instrumentation or catheterization. Electronically Signed   By: Marijo Sanes M.D.   On: 05/20/2019 12:19   Mr Lumbar Spine W Wo Contrast  Result Date: 05/20/2019 CLINICAL DATA:  84 y/o F; recent surgery, worsening back pain, rule out epidural hematoma or infection. EXAM: MRI LUMBAR SPINE WITHOUT AND WITH CONTRAST TECHNIQUE: Multiplanar and multiecho pulse sequences of the lumbar spine were obtained without and with intravenous contrast. CONTRAST:  6 cc Gadavist COMPARISON:  05/20/2019 CT abdomen and pelvis. 05/11/2019 lumbar spine MRI.  FINDINGS: Segmentation:  Standard. Alignment:  Stable L3-4 and L4-5 grade 1 anterolisthesis. Vertebrae: Interval kyphoplasty of L1 and L2 vertebral bodies from prior lumbar spine MRI. Chronic L3 and L4 kyphoplasty changes. There is mild edema within the L1, L2, L3 vertebral bodies as well as linear tracks extending into the pedicles compatible with recent intervention and compression deformity, however, there is no new loss of vertebral body height. No abnormal intervertebral disc signal. Conus medullaris and cauda equina: Conus extends to the L2 level. Conus and cauda equina appear normal. Paraspinal and other soft tissues: There are linear tracks with mild edema and enhancement corresponding to the kyphoplasty needle path within the posterior soft tissues. Otherwise no edema, hematoma, or fluid collection is noted. Disc levels: T12-L1: Mild disc bulge and facet hypertrophy. No foraminal or spinal canal stenosis. L1-2: No significant disc displacement, foraminal stenosis, or canal stenosis. L2-3: Disc bulge with endplate marginal osteophytes, left-greater-than-right facet hypertrophy, and ligamentum flavum hypertrophy. Small left central disc protrusion. Moderate spinal canal stenosis. Mild right and severe left neural foraminal stenosis. L3-4: Grade 1 anterolisthesis with small uncovered disc bulge, endplate marginal osteophytes, and bilateral facet hypertrophy. Moderate spinal canal stenosis. No significant foraminal stenosis. L4-5: Grade 1 anterolisthesis with small uncovered disc bulge eccentric to the right as well as right greater than left facet hypertrophy. Mild narrowing of the right lateral recess. No foraminal or spinal canal stenosis. L5-S1: Small central protrusion.  Right foraminal and extraforaminal endplate marginal osteophytes. No significant foraminal or spinal canal stenosis. The exiting right L5 nerve root is contacted in the right extraforaminal zone by endplate osteophytes. IMPRESSION: 1. L1-L4  kyphoplasty changes. Mild edema in the L1-L3 vertebral bodies as well as linear tracks extending into pedicles and posterior soft tissues compatible with recent intervention and/or injury, however, vertebral body height is stable. No findings of discitis. 2. Stable spondylosis of the lumbar spine with moderate spinal canal stenosis at L2-3 and L3-4. Electronically Signed   By: Kristine Garbe M.D.   On: 05/20/2019 19:36   Ct L-spine No Charge  Result Date: 05/20/2019 CLINICAL DATA:  Back pain. EXAM: CT LUMBAR SPINE WITHOUT CONTRAST TECHNIQUE: Multidetector CT imaging of the lumbar spine was performed without intravenous contrast administration. Multiplanar CT image reconstructions were also generated. COMPARISON:  MRI 05/11/2019 FINDINGS: Segmentation: There are five lumbar type vertebral bodies. The last full intervertebral disc space is labeled L5-S1. This correlates with the prior MRI. Alignment: Stable degenerative anterolisthesis of L3 and L4. Vertebrae: Vertebral augmentation changes noted at L1, L2, L3 and L4. Bone cement cement noted in the epidural spaces at L1, L2 and L4 but no significant canal stenosis. No acute fractures are identified. Stable advanced facet disease with facet fusions most notably at L3-4 and L4-5 Paraspinal and other soft tissues: No significant paraspinal or retroperitoneal findings. Stable advanced aortic calcifications. 3.7 cm infrarenal abdominal aortic aneurysm. A right renal cyst is stable. Disc levels: No large disc protrusions are identified. There is stable moderate to moderately severe spinal and bilateral lateral recess stenosis at L3-4. IMPRESSION: 1. Vertebral augmentation changes at L1, L2, L3 and L4. Epidural bone cement is noted at multiple levels but no significant canal stenosis. 2. No acute fractures. 3. Stable advanced degenerative lumbar spondylosis with multilevel disc disease and facet disease. Stable appearing moderate to moderately severe spinal  and bilateral lateral recess stenosis at L3-4. Electronically Signed   By: Marijo Sanes M.D.   On: 05/20/2019 12:13   ASSESSMENT AND PLAN:  Patient is an 84 year old female witha known history of hypertension,hypothyroidism,chronic leukocytosis secondary tohistory of MDS/MPN and was on Jakafi in the pastand recent discharge from the hospital following kyphoplasty for L4 compression fracture begin readmitted for worsening back pain.   1.Back pain  - s/p kyphoplasty for L4 compression fracture. Back with worsening back pains. - CT lumbar spine and abd-pelvis not showing any acute patho - MRI of the lumbar spine  showing any other acute pathology either -L1-L2 kyphoplasty done 05/13/19. -Discussed with orthopedic Dr. Rudene Christians who recommends anesthesia evaluation for possible epidural injection while she is here. I have requested consultation  2.Hypertension - monitor  3.Hypothyroidism. Resume Synthroid. TSH level was in therapeutic range last time  4.History of depression Stable. Continue Celexa.  5.History of asthma Stable.      All the records are reviewed and case discussed with Care Management/Social Worker. Management plans discussed with the patient, orthopedics and they are in agreement.  CODE STATUS: Full Code  TOTAL TIME TAKING CARE OF THIS PATIENT: 35 minutes.   More than 50% of the time was spent in counseling/coordination of care: YES  POSSIBLE D/C IN 1-2 DAYS, DEPENDING ON CLINICAL CONDITION.   Max Sane M.D on 05/21/2019 at 11:32 AM  Between 7am to 6pm - Pager - [phone]  After 6pm go to [email or web address] - [facility]  Office  [phone]  CC: Primary care physician; Alfonse Flavors,  MD  Note: This dictation was prepared with Dragon dictation along with smaller phrase technology. Any transcriptional errors that result from this process are unintentional.

## 2019-05-22 DIAGNOSIS — R109 Unspecified abdominal pain: Secondary | ICD-10-CM | POA: Diagnosis not present

## 2019-05-22 DIAGNOSIS — M549 Dorsalgia, unspecified: Secondary | ICD-10-CM | POA: Diagnosis present

## 2019-05-22 DIAGNOSIS — Z515 Encounter for palliative care: Secondary | ICD-10-CM | POA: Diagnosis not present

## 2019-05-22 DIAGNOSIS — Z1159 Encounter for screening for other viral diseases: Secondary | ICD-10-CM | POA: Diagnosis not present

## 2019-05-22 DIAGNOSIS — Z79890 Hormone replacement therapy (postmenopausal): Secondary | ICD-10-CM | POA: Diagnosis not present

## 2019-05-22 DIAGNOSIS — Z888 Allergy status to other drugs, medicaments and biological substances status: Secondary | ICD-10-CM | POA: Diagnosis not present

## 2019-05-22 DIAGNOSIS — R112 Nausea with vomiting, unspecified: Secondary | ICD-10-CM | POA: Diagnosis not present

## 2019-05-22 DIAGNOSIS — Z885 Allergy status to narcotic agent status: Secondary | ICD-10-CM | POA: Diagnosis not present

## 2019-05-22 DIAGNOSIS — D469 Myelodysplastic syndrome, unspecified: Secondary | ICD-10-CM | POA: Diagnosis not present

## 2019-05-22 DIAGNOSIS — M4800 Spinal stenosis, site unspecified: Secondary | ICD-10-CM | POA: Diagnosis present

## 2019-05-22 DIAGNOSIS — Z8781 Personal history of (healed) traumatic fracture: Secondary | ICD-10-CM | POA: Diagnosis not present

## 2019-05-22 DIAGNOSIS — Z91041 Radiographic dye allergy status: Secondary | ICD-10-CM | POA: Diagnosis not present

## 2019-05-22 DIAGNOSIS — F329 Major depressive disorder, single episode, unspecified: Secondary | ICD-10-CM | POA: Diagnosis present

## 2019-05-22 DIAGNOSIS — Z7951 Long term (current) use of inhaled steroids: Secondary | ICD-10-CM | POA: Diagnosis not present

## 2019-05-22 DIAGNOSIS — Z993 Dependence on wheelchair: Secondary | ICD-10-CM | POA: Diagnosis not present

## 2019-05-22 DIAGNOSIS — Z9221 Personal history of antineoplastic chemotherapy: Secondary | ICD-10-CM | POA: Diagnosis not present

## 2019-05-22 DIAGNOSIS — J45909 Unspecified asthma, uncomplicated: Secondary | ICD-10-CM | POA: Diagnosis present

## 2019-05-22 DIAGNOSIS — M4856XD Collapsed vertebra, not elsewhere classified, lumbar region, subsequent encounter for fracture with routine healing: Secondary | ICD-10-CM | POA: Diagnosis present

## 2019-05-22 DIAGNOSIS — R040 Epistaxis: Secondary | ICD-10-CM | POA: Diagnosis not present

## 2019-05-22 DIAGNOSIS — E039 Hypothyroidism, unspecified: Secondary | ICD-10-CM | POA: Diagnosis present

## 2019-05-22 DIAGNOSIS — Z87891 Personal history of nicotine dependence: Secondary | ICD-10-CM | POA: Diagnosis not present

## 2019-05-22 DIAGNOSIS — R6883 Chills (without fever): Secondary | ICD-10-CM | POA: Diagnosis not present

## 2019-05-22 DIAGNOSIS — Z79899 Other long term (current) drug therapy: Secondary | ICD-10-CM | POA: Diagnosis not present

## 2019-05-22 DIAGNOSIS — I10 Essential (primary) hypertension: Secondary | ICD-10-CM | POA: Diagnosis present

## 2019-05-22 DIAGNOSIS — K219 Gastro-esophageal reflux disease without esophagitis: Secondary | ICD-10-CM | POA: Diagnosis present

## 2019-05-22 DIAGNOSIS — Z90710 Acquired absence of both cervix and uterus: Secondary | ICD-10-CM | POA: Diagnosis not present

## 2019-05-22 LAB — CBC
HCT: 34.5 % — ABNORMAL LOW (ref 36.0–46.0)
Hemoglobin: 11 g/dL — ABNORMAL LOW (ref 12.0–15.0)
MCH: 27.5 pg (ref 26.0–34.0)
MCHC: 31.9 g/dL (ref 30.0–36.0)
MCV: 86.3 fL (ref 80.0–100.0)
Platelets: 340 10*3/uL (ref 150–400)
RBC: 4 MIL/uL (ref 3.87–5.11)
RDW: 20.9 % — ABNORMAL HIGH (ref 11.5–15.5)
WBC: 18.4 10*3/uL — ABNORMAL HIGH (ref 4.0–10.5)
nRBC: 0.3 % — ABNORMAL HIGH (ref 0.0–0.2)

## 2019-05-22 LAB — BASIC METABOLIC PANEL
Anion gap: 8 (ref 5–15)
BUN: 17 mg/dL (ref 8–23)
CO2: 24 mmol/L (ref 22–32)
Calcium: 10.7 mg/dL — ABNORMAL HIGH (ref 8.9–10.3)
Chloride: 104 mmol/L (ref 98–111)
Creatinine, Ser: 1.29 mg/dL — ABNORMAL HIGH (ref 0.44–1.00)
GFR calc Af Amer: 45 mL/min — ABNORMAL LOW (ref >60)
GFR calc non Af Amer: 39 mL/min — ABNORMAL LOW (ref >60)
Glucose, Bld: 102 mg/dL — ABNORMAL HIGH (ref 70–99)
Potassium: 3.6 mmol/L (ref 3.5–5.1)
Sodium: 136 mmol/L (ref 135–145)

## 2019-05-22 LAB — TSH: TSH: 3.756 u[IU]/mL (ref 0.350–4.500)

## 2019-05-22 MED ORDER — ENSURE ENLIVE PO LIQD
237.0000 mL | Freq: Three times a day (TID) | ORAL | Status: DC
  Administered 2019-05-23 – 2019-05-26 (×5): 237 mL via ORAL

## 2019-05-22 MED ORDER — OXYCODONE-ACETAMINOPHEN 7.5-325 MG PO TABS
1.0000 | ORAL_TABLET | Freq: Four times a day (QID) | ORAL | Status: DC | PRN
  Administered 2019-05-22 – 2019-05-26 (×9): 1 via ORAL
  Filled 2019-05-22 (×10): qty 1

## 2019-05-22 NOTE — Progress Notes (Signed)
[facility] [place] NAME: Carmelina Balducci    MR#:  [id number]  DATE OF BIRTH:  1934-11-27  SUBJECTIVE:  CHIEF COMPLAINT:   Chief Complaint  Patient presents with  . Back Pain  back pain has worsened today REVIEW OF SYSTEMS:  Review of Systems  Constitutional: Negative for diaphoresis, fever, malaise/fatigue and weight loss.  HENT: Negative for ear discharge, ear pain, hearing loss, nosebleeds, sore throat and tinnitus.   Eyes: Negative for blurred vision and pain.  Respiratory: Negative for cough, hemoptysis, shortness of breath and wheezing.   Cardiovascular: Negative for chest pain, palpitations, orthopnea and leg swelling.  Gastrointestinal: Negative for abdominal pain, blood in stool, constipation, diarrhea, heartburn, nausea and vomiting.  Genitourinary: Negative for dysuria, frequency and urgency.  Musculoskeletal: Positive for back pain. Negative for myalgias.  Skin: Negative for itching and rash.  Neurological: Negative for dizziness, tingling, tremors, focal weakness, seizures, weakness and headaches.  Psychiatric/Behavioral: Negative for depression. The patient is not nervous/anxious.    DRUG ALLERGIES:   Allergies  Allergen Reactions  . Aspirin Shortness Of Breath  . Celecoxib Shortness Of Breath  . Iodinated Diagnostic Agents Other (See Comments)    Reaction:  Decreased kidney function  . Losartan Itching  . Morphine Nausea And Vomiting  . Verapamil Itching   VITALS:  Blood pressure 121/66, pulse (!) 52, temperature 98.5 F (36.9 C), resp. rate 19, height 5\' 3"  (1.6 m), weight 57.3 kg, SpO2 93 %. PHYSICAL EXAMINATION:  Physical Exam HENT:     Head: Normocephalic and atraumatic.  Eyes:     Conjunctiva/sclera: Conjunctivae normal.     Pupils: Pupils are equal, round, and reactive to light.  Neck:     Musculoskeletal: Normal range of motion and neck supple.     Thyroid: No thyromegaly.     Trachea: No  tracheal deviation.  Cardiovascular:     Rate and Rhythm: Normal rate and regular rhythm.     Heart sounds: Normal heart sounds.  Pulmonary:     Effort: Pulmonary effort is normal. No respiratory distress.     Breath sounds: Normal breath sounds. No wheezing.  Chest:     Chest wall: No tenderness.  Abdominal:     General: Bowel sounds are normal. There is no distension.     Palpations: Abdomen is soft.     Tenderness: There is no abdominal tenderness.  Musculoskeletal: Normal range of motion.  Skin:    General: Skin is warm and dry.     Findings: No rash.  Neurological:     Mental Status: She is alert and oriented to person, place, and time.     Cranial Nerves: No cranial nerve deficit.    LABORATORY PANEL:  Female CBC Recent Labs  Lab 05/22/19 0444  WBC 18.4*  HGB 11.0*  HCT 34.5*  PLT 340   ------------------------------------------------------------------------------------------------------------------ Chemistries  Recent Labs  Lab 05/22/19 0444  NA 136  K 3.6  CL 104  CO2 24  GLUCOSE 102*  BUN 17  CREATININE 1.29*  CALCIUM 10.7*   RADIOLOGY:  No results found. ASSESSMENT AND PLAN:  Patient is an 84 year old female witha known history of hypertension,hypothyroidism,chronic leukocytosis secondary tohistory of MDS/MPN and was on Jakafi in the pastand recent discharge from the hospital following kyphoplasty for L4 compression fracture begin readmitted for worsening back pain.   1.Back pain  - s/p kyphoplasty for L4 compression fracture. Back with worsening  back pains. - CT lumbar spine and abd-pelvis not showing any acute patho - MRI of the lumbar spine  showing any other acute pathology either -L1-L2 kyphoplasty done 05/13/19. -Discussed with orthopedic Dr. Rudene Christians who recommends anesthesia evaluation for possible epidural injection while she is here. I have requested consultation - d/w Dr Andree Elk who will d/w his partner to get epidural injection while  here, possibly today  2.Hypertension - monitor  3.Hypothyroidism. Resume Synthroid. TSH level was in therapeutic range last time  4.History of depression Stable. Continue Celexa.  5.History of asthma Stable.      All the records are reviewed and case discussed with Care Management/Social Worker. Management plans discussed with the patient, orthopedics and they are in agreement.  CODE STATUS: Full Code  TOTAL TIME TAKING CARE OF THIS PATIENT: 35 minutes.   More than 50% of the time was spent in counseling/coordination of care: YES  POSSIBLE D/C IN 1-2 DAYS, DEPENDING ON CLINICAL CONDITION.   Max Sane M.D on 05/22/2019 at 12:30 PM  Between 7am to 6pm - Pager - [phone]  After 6pm go to [email or web address] - [employer]  [facility]  Office  [phone]  CC: Primary care physician; Zhou-Talbert, Elwyn Lade, MD  Note: This dictation was prepared with Dragon dictation along with smaller phrase technology. Any transcriptional errors that result from this process are unintentional.

## 2019-05-22 NOTE — TOC Progression Note (Signed)
Transition of Care [facility]) - Progression Note    Patient Details  Name: Veronica Hubbard MRN: [id number] Date of Birth: Nov 04, 1935  Transition of Care [facility] CM/SW Contact  Shelbie Hutching, RN Phone Number: 05/22/2019, 12:22 PM  Clinical Narrative:    Physical therapy has recommended SNF for rehab.  Patient reports that she would rather go home.  Patient reports that she does better at home.  RNCM will cont to follow, pain has been limiting the patient's ability to work with OT and PT.    Expected Discharge Plan: [place]    Expected Discharge Plan and Services Expected Discharge Plan: [place] Choice: Home Health                             HH Arranged: PT [place]: [place] Date [facility] Agency Contacted: 05/20/19 Time HH Agency Contacted: 62 Representative spoke with at [place]: [place] (SDOH) Interventions    Readmission Risk Interventions No flowsheet data found.

## 2019-05-22 NOTE — Progress Notes (Signed)
Initial Nutrition Assessment  RD working remotely.  DOCUMENTATION CODES:   Not applicable, suspect some degree of malnutrition but unable to confirm at this time  INTERVENTION:   - Recommend liberalizing diet to Regular  - Increase Ensure Enlive po to TID, each supplement provides 350 kcal and 20 grams of protein (chocolate flavor)  NUTRITION DIAGNOSIS:   Inadequate oral intake related to poor appetite as evidenced by per patient/family report.  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  Palliative medicine following for symptom management and GOC.  Spoke with pt via phone call to room. Pt states she is doing better today than she was yesterday. Pt reports that her appetite is improving slowly. Pt does not believe that she has had anything yet to eat today but states she plans to eat something at dinner. Pt also reports she hasn't had any Ensures yet today but plans to drink one at dinner. Pt reports she typically does well with the Ensure and enjoys the chocolate flavor.  Pt reports that PTA, she had a poor appetite and didn't eat well. Difficult to obtain more specific information about pt's PO intake PTA.  Pt endorses experiencing weight loss over the last several years. She is unsure what her UBW is. Pt states she does not know why she is losing weight other than "not eating well."  Reviewed weight history in chart. Pt with progressive weight loss over the last month. Overall, pt has lost 10.7 kg in less than 1 month. This is a 15.7% weight loss  which is significant for timeframe. Strongly suspect pt with some degree of malnutrition but unable to confirm at this time.  RD discussed the importance of adequate PO intake in maintaining lean muscle mass and preventing further weight loss. Pt reports she will definitely try to eat something at dinnertime and will ask for an Ensure Enlive at that time. Pt expresses understanding regarding the importance of adequate nutrition.  Medications reviewed and include: cholecalciferol, Colace, Ensure Enlive BID (pt refusing), Protonix, Senna  Labs reviewed.  NUTRITION - FOCUSED PHYSICAL EXAM:  Unable to complete at this time. RD working remotely.  Diet Order:   Diet Order            Diet 2 gram sodium Room service appropriate? Yes; Fluid consistency: Thin  Diet effective now              EDUCATION NEEDS:   Education needs have been addressed  Skin:  Skin Assessment: Skin Integrity Issues: Incisions: back  Last BM:  05/21/19 large type 4  Height:   Ht Readings from Last 1 Encounters:  05/20/19 5\' 3"  (1.6 m)    Weight:   Wt Readings from Last 1 Encounters:  05/22/19 57.3 kg    Ideal Body Weight:  52.3 kg  BMI:  Body mass index is 22.38 kg/m.  Estimated Nutritional Needs:   Kcal:  1350-1550  Protein:  60-75 rams  Fluid:  1.4-1.6 L    Gaynell Face, MS, RD, LDN Inpatient Clinical Dietitian Pager: [phone] Weekend/After Hours: [phone]

## 2019-05-22 NOTE — Progress Notes (Signed)
Daily Progress Note   Patient Name: Veronica Hubbard       Date: 05/22/2019 DOB: 05-15-1935  Age: 84 y.o. MRN#: [id number] Attending Physician: Max Sane, MD Primary Care Physician: Alfonse Flavors, MD Admit Date: 05/20/2019  Reason for Consultation/Follow-up: Establishing goals of care and Pain control  Subjective: Per RN patient reporting pain a little better today. She has televisit scheduled with pain management clinic.  Patient is asleep on my evaluation. Given her difficulty finding comfort, will not awake her for primary advanced care planning.   ROS  Length of Stay: 0  Current Medications: Scheduled Meds:  . acetaminophen  650 mg Oral Q6H  . allopurinol  200 mg Oral Daily  . arformoterol  15 mcg Nebulization BID  . budesonide  0.5 mg Nebulization BID  . calcitonin (salmon)  1 spray Alternating Nares Daily  . cholecalciferol  1,000 Units Oral Daily  . citalopram  20 mg Oral Daily  . docusate sodium  100 mg Oral BID  . ezetimibe  10 mg Oral QHS  . feeding supplement (ENSURE ENLIVE)  237 mL Oral BID BM  . hydrALAZINE  50 mg Oral Q8H  . isosorbide mononitrate  30 mg Oral Daily  . levothyroxine  150 mcg Oral QAC breakfast  . lidocaine  2 patch Transdermal Daily  . Melatonin  5 mg Oral QHS  . metoprolol tartrate  25 mg Oral BID  . pantoprazole  40 mg Oral BID  . senna-docusate  1 tablet Oral BID  . sodium chloride flush  3 mL Intravenous Q12H  . umeclidinium bromide  1 puff Inhalation Daily    Continuous Infusions: . sodium chloride      PRN Meds: sodium chloride, acetaminophen **OR** acetaminophen, bisacodyl, cyclobenzaprine, ondansetron **OR** ondansetron (ZOFRAN) IV, oxyCODONE-acetaminophen, polyethylene glycol, sodium chloride flush, traZODone   Physical Exam          Vital Signs: BP 121/66   Pulse (!) 52   Temp 98.5 F (36.9 C)   Resp 19   Ht 5\' 3"  (1.6 m)   Wt 57.3 kg   SpO2 93%   BMI 22.38 kg/m  SpO2: SpO2: 93 % O2 Device: O2 Device: Room Air O2 Flow Rate:    Intake/output summary:   Intake/Output Summary (Last 24 hours) at 05/22/2019 1230 Last data filed at  05/21/2019 1808 Gross per 24 hour  Intake -  Output 300 ml  Net -300 ml   LBM: Last BM Date: 05/21/19 Baseline Weight: Weight: 59 kg Most recent weight: Weight: 57.3 kg       Palliative Assessment/Data: PPS: unable to assess      Patient Active Problem List   Diagnosis Date Noted  . MDS (myelodysplastic syndrome) ([place])   . History of compression fracture of spine   . Palliative care by specialist   . Severe back pain 05/20/2019  . Compression fracture of lumbar spine, non-traumatic ([place]) 05/11/2019  . Intractable pain 04/19/2019  . AAA (abdominal aortic aneurysm) without rupture ([place]) 04/09/2017  . Bilateral carotid artery stenosis 04/09/2017  . Thoracic aortic aneurysm without rupture ([place]) 04/09/2017  . Hyponatremia 02/23/2016  . Chest pain 02/23/2016  . Gastroenteritis due to norovirus 02/09/2016    Palliative Care Assessment & Plan   Patient Profile:  84 y.o. female  with past medical history of compression fractures s/p kyphoplasty 6/4, 6/16, 7/7 with recent stay in rehab, discharged home with her sister, MDS (per Dr. Grayland Ormond consult 05/17/19- previously treated with Rosana Berger with persistent leucocytosis, not requiring treatment at this time, plan for f/u with hematologist in [place], [place]), asthma, HTN admitted on 05/20/2019 with increasing back pain. MRI and CT scan show no acute process. Per ortho no findings for acute cause of pain. Ortho and anesthesia have been consulted for possible epidural injection. Palliative medicine consulted for symptom management and GOC.   Assessment/Recommendations/Plan   Will defer further pain mgmt to  pain mgmt clinic- continue previous orders  Recommend further primary advanced care planning by outpatient palliative or patient's primary care provider  Prognosis:   Unable to determine  Discharge Planning:  To Be Determined  Care plan was discussed with patient's RN  Thank you for allowing the Palliative Medicine Team to assist in the care of this patient.   Time In: 1100 Time Out: 1115 Total Time 15 mins Prolonged Time Billed no      Greater than 50%  of this time was spent counseling and coordinating care related to the above assessment and plan.  Mariana Kaufman, AGNP-C Palliative Medicine   Please contact Palliative Medicine Team phone at [phone] for questions and concerns.

## 2019-05-22 NOTE — Progress Notes (Signed)
OT Cancellation Note  Patient Details Name: Veronica Hubbard MRN: [id number] DOB: Sep 06, 1935   Cancelled Treatment:    Reason Eval/Treat Not Completed: Pain limiting ability to participate;Patient declined, no reason specified. Upon attempt, pt in bed with eyes closed, head against L side rail of bed with feet dangling off R side of bed. Pt reports being okay and refuses therapist's assist to reposition. Pt reporting 10/10 R hip pain with any movement and winces/moans with any movement. Will re-attempt OT evaluation at later date/time as pt is able to participate and medically appropriate.   Jeni Salles, MPH, MS, OTR/L ascom [phone] 05/22/19, 10:16 AM

## 2019-05-23 LAB — CBC
HCT: 37.1 % (ref 36.0–46.0)
Hemoglobin: 11.5 g/dL — ABNORMAL LOW (ref 12.0–15.0)
MCH: 27.5 pg (ref 26.0–34.0)
MCHC: 31 g/dL (ref 30.0–36.0)
MCV: 88.8 fL (ref 80.0–100.0)
Platelets: 357 10*3/uL (ref 150–400)
RBC: 4.18 MIL/uL (ref 3.87–5.11)
RDW: 21.2 % — ABNORMAL HIGH (ref 11.5–15.5)
WBC: 17.7 10*3/uL — ABNORMAL HIGH (ref 4.0–10.5)
nRBC: 0.3 % — ABNORMAL HIGH (ref 0.0–0.2)

## 2019-05-23 LAB — BASIC METABOLIC PANEL
Anion gap: 9 (ref 5–15)
BUN: 17 mg/dL (ref 8–23)
CO2: 26 mmol/L (ref 22–32)
Calcium: 10.9 mg/dL — ABNORMAL HIGH (ref 8.9–10.3)
Chloride: 104 mmol/L (ref 98–111)
Creatinine, Ser: 1.43 mg/dL — ABNORMAL HIGH (ref 0.44–1.00)
GFR calc Af Amer: 39 mL/min — ABNORMAL LOW (ref >60)
GFR calc non Af Amer: 34 mL/min — ABNORMAL LOW (ref >60)
Glucose, Bld: 103 mg/dL — ABNORMAL HIGH (ref 70–99)
Potassium: 3.9 mmol/L (ref 3.5–5.1)
Sodium: 139 mmol/L (ref 135–145)

## 2019-05-23 LAB — GLUCOSE, CAPILLARY: Glucose-Capillary: 98 mg/dL (ref 70–99)

## 2019-05-23 NOTE — Evaluation (Signed)
Occupational Therapy Evaluation Patient Details Name: Veronica Hubbard MRN: [id number] DOB: Nov 10, 1934 Today's Date: 05/23/2019    History of Present Illness Pt admitted for severe back pain. History includes recent compression fracture with kyphos on 6/4 (L3), 6/16 (L3/L4), 7/7 (L1-L2). Since discharge, pt reports increased pain with all mobility and has been utilized the [facility] for primary mobility. Pt with ortho reporting possible steriod injection for pain relief. Of note, pt reports she didn't bring her back brace from home.   Clinical Impression   Pt seen for OT evaluation this date. Prior to hospital admission, pt required assistance form relatives for ADL and IADL tasks. Pt lives alone but has a sister who assists her regularly. Currently pt demonstrates impairments in activity tolerance, pain mgt, and safety awareness requiring moderate to max assist for LB ADL with AE to support adherence to back precautions and minimize pain. Pt would benefit from skilled OT to address noted impairments and functional limitations (see below for any additional details) in order to maximize safety and independence while minimizing falls risk and caregiver burden.  Upon hospital discharge, recommend pt discharge to SNF.     Follow Up Recommendations  SNF    Equipment Recommendations  None recommended by OT    Recommendations for Other Services       Precautions / Restrictions Precautions Precautions: Fall;Back Precaution Booklet Issued: No Precaution Comments: s/p kyphoplasty Required Braces or Orthoses: Spinal Brace Spinal Brace: Lumbar corset Restrictions Weight Bearing Restrictions: Yes RLE Weight Bearing: Weight bearing as tolerated LLE Weight Bearing: Weight bearing as tolerated      Mobility Bed Mobility Overal bed mobility: Needs Assistance Bed Mobility: Sidelying to Sit;Rolling;Sit to Sidelying Rolling: Min guard Sidelying to sit: Min assist Supine to sit: Min assist   Sit  to sidelying: Min assist General bed mobility comments: needs assist for LE mgt as well as consistent cueing for log-roll technique. Pt impulsive with movements, and tended to disregard back precautions. Education provided on importance of maintaining back precautions for safety as well as pain mgt on this date.  Transfers Overall transfer level: Needs assistance Equipment used: Rolling walker (2 wheeled) Transfers: Sit to/from Stand Sit to Stand: Min assist         General transfer comment: Deferred. Pt declined OOB activity on this date, stating she was "just ready to go home".    Balance Overall balance assessment: Needs assistance Sitting-balance support: Feet supported;Single extremity supported Sitting balance-Leahy Scale: Fair Sitting balance - Comments: Noted left lateral lean onto elbow consistently on this date. Pt required prompting in order to maintain upright posture.   Standing balance support: Bilateral upper extremity supported Standing balance-Leahy Scale: Fair                             ADL either performed or assessed with clinical judgement   ADL Overall ADL's : Needs assistance/impaired Eating/Feeding: Sitting;Set up   Grooming: Sitting;Set up   Upper Body Bathing: Sitting;Minimal assistance   Lower Body Bathing: Sitting/lateral leans;Moderate assistance;Set up;Cueing for back precautions   Upper Body Dressing : Set up;Sitting   Lower Body Dressing: Maximal assistance;Moderate assistance;Sit to/from stand;Cueing for back precautions Lower Body Dressing Details (indicate cue type and reason): Pt req. max assist to don socks on this date. Pt stated she has been unable to reach feet for "quite a while" and requires assistance at baseline. Further limited by low back pain. Would benefit from further education in  AE for LBD. Unable to demo/trial on this date. Toilet Transfer: RW;Minimal assistance;Cueing for safety;Cueing for Art therapist Details (indicate cue type and reason): Pt requires consistent cueing in order to maintain back precautions during mobility. Toileting- Clothing Manipulation and Hygiene: Sit to/from stand;Minimal assistance;Cueing for back precautions;Moderate assistance       Functional mobility during ADLs: Rolling walker;Minimal assistance;Moderate assistance General ADL Comments: Pt generally non-complian with back precautions. Required consistent cueing and review during bed mobility on this date. Overall decreased safety awareness with ADL tasks.     Vision Baseline Vision/History: Wears glasses Wears Glasses: Reading only Patient Visual Report: No change from baseline       Perception     Praxis      Pertinent Vitals/Pain Faces Pain Scale: Hurts a little bit Pain Location: denies pain at rest- with movement c/o low back/sides Pain Descriptors / Indicators: Constant;Sore;Grimacing Pain Intervention(s): Limited activity within patient's tolerance;Monitored during session;Premedicated before session;Repositioned     Hand Dominance Right   Extremity/Trunk Assessment Upper Extremity Assessment Upper Extremity Assessment: Overall WFL for tasks assessed(Grossly 4/5 throughout. Grip WFL.)   Lower Extremity Assessment Lower Extremity Assessment: Generalized weakness;Defer to PT evaluation       Communication Communication Communication: No difficulties   Cognition Arousal/Alertness: Lethargic;Suspect due to medications Behavior During Therapy: [facility] for tasks assessed/performed;Impulsive Overall Cognitive Status: Within Functional Limits for tasks assessed                                 General Comments: Pt stated she was feeling better on this date, however c/o of pain during bed mobility. At times closing eyes while sitting EOB and ultimately laying back down after brief time. Suspect lethargy due to medications.   General Comments       Exercises Other  Exercises Other Exercises: Pt educated in falls prevention strategies as well as strategies for maintaining back precautions during bed mobility and ADL tasks in order to maximize safety and minimize pain.   Shoulder Instructions      Home Living Family/patient expects to be discharged to:: Private residence Living Arrangements: Alone Available Help at Discharge: Family Type of Home: House Home Access: [place]: One [place]: Environmental consultant - 2 wheels;Cane - single point;Bedside commode;Shower seat;Grab bars - toilet;Hand held shower head;Transport chair;Wheelchair - manual   Additional Comments: At baseline, lives alone in [place] home with rampted entrance; sister provides check in/assist as needed      Prior Functioning/Environment Level of Independence: Independent with assistive device(s)        Comments: has been using WC for all mobility needs recently        OT Problem List: Decreased strength;Pain;Decreased activity tolerance;Decreased safety awareness;Decreased knowledge of use of DME or AE;Decreased knowledge of precautions;Impaired balance (sitting and/or standing)      OT Treatment/Interventions: Self-care/ADL training;Therapeutic exercise;Therapeutic activities;DME and/or AE instruction;Patient/family education    OT Goals(Current goals can be found in the care plan section) Acute Rehab OT Goals Patient Stated Goal: to make pain better OT Goal Formulation: With patient Time For Goal Achievement: 06/06/19 Potential to Achieve Goals: Fair ADL Goals Pt Will Perform Upper Body Dressing: with set-up;with min assist;sitting(With LRAD for safety and improved functional independence) Pt Will Perform Lower Body Dressing: sit to/from stand;with min assist;with adaptive equipment(With LRAD for  safety and improved functional independence) Pt Will Transfer to Toilet: ambulating;bedside commode;with supervision(With LRAD  for safety and improved functional independence)  OT Frequency: Min 2X/week   Barriers to D/C: Decreased caregiver support          Co-evaluation              AM-PAC OT "6 Clicks" Daily Activity     Outcome Measure Help from another person eating meals?: None Help from another person taking care of personal grooming?: A Little Help from another person toileting, which includes using toliet, bedpan, or urinal?: A Little Help from another person bathing (including washing, rinsing, drying)?: A Lot Help from another person to put on and taking off regular upper body clothing?: A Little Help from another person to put on and taking off regular lower body clothing?: A Lot 6 Click Score: 17   End of Session Nurse Communication: Mobility status  Activity Tolerance: Patient limited by lethargy;Patient limited by pain Patient left: in bed;with bed alarm set;with call bell/phone within reach  OT Visit Diagnosis: Other abnormalities of gait and mobility (R26.89);Muscle weakness (generalized) (M62.81);Pain Pain - Right/Left: (Back) Pain - part of body: (back)                Time: 5852-7782 OT Time Calculation (min): 14 min Charges:  OT General Charges $OT Visit: 1 Visit OT Evaluation $OT Eval Moderate Complexity: 1 Mod  Shara Blazing, M.S., OTR/L Ascom: [phone] 05/23/19, 9:31 AM

## 2019-05-23 NOTE — Progress Notes (Signed)
Physical Therapy Treatment Patient Details Name: Veronica Hubbard MRN: [id number] DOB: 1935-06-15 Today's Date: 05/23/2019    History of Present Illness Pt admitted for severe back pain. History includes recent compression fracture with kyphos on 6/4 (L3), 6/16 (L3/L4), 7/7 (L1-L2). Since discharge, pt reports increased pain with all mobility and has been utilized the [facility] for primary mobility. Pt with ortho reporting possible steriod injection for pain relief. Of note, pt reports she didn't bring her back brace from home.    PT Comments    Pt is making gradual progress towards goals secondary to pain with WBing on L hip. Motivated to try ambulation. Mindful of spinal precautions as she doesn't have brace. Ambulation worsens pain, pt requesting to return back to bed. Unable to further ambulation and moaning in pain. Will continue to progress.   Follow Up Recommendations  SNF     Equipment Recommendations       Recommendations for Other Services       Precautions / Restrictions Precautions Precautions: Fall;Back Precaution Booklet Issued: No Precaution Comments: s/p kyphoplasty Required Braces or Orthoses: Spinal Brace Spinal Brace: Lumbar corset Restrictions Weight Bearing Restrictions: Yes RLE Weight Bearing: Weight bearing as tolerated LLE Weight Bearing: Weight bearing as tolerated    Mobility  Bed Mobility Overal bed mobility: Needs Assistance Bed Mobility: Supine to Sit     Supine to sit: Min assist     General bed mobility comments: needs assist for B LEs and trunk support. Able to sit at EOB without pain. Once supine in bed, prefers to be in sidelying position  Transfers Overall transfer level: Needs assistance Equipment used: Rolling walker (2 wheeled) Transfers: Sit to/from Stand Sit to Stand: Min assist         General transfer comment: Pt with safe technique. RW used  Ambulation/Gait Ambulation/Gait assistance: Herbalist (Feet):  40 Feet Assistive device: Rolling walker (2 wheeled) Gait Pattern/deviations: Step-to pattern;Decreased step length - right;Decreased step length - left     General Gait Details: Has difficulty with WBing on L LE due to hip pain. Unable to further ambulate   Stairs             Wheelchair Mobility    Modified Rankin (Stroke Patients Only)       Balance Overall balance assessment: Needs assistance Sitting-balance support: Feet supported;Single extremity supported Sitting balance-Leahy Scale: Good     Standing balance support: Bilateral upper extremity supported Standing balance-Leahy Scale: Fair                              Cognition Arousal/Alertness: Awake/alert Behavior During Therapy: WFL for tasks assessed/performed;Impulsive Overall Cognitive Status: Within Functional Limits for tasks assessed                                        Exercises Other Exercises Other Exercises: deferred due to intense pain    General Comments        Pertinent Vitals/Pain Pain Assessment: 0-10 Pain Score: 9  Pain Location: L hip and back Pain Descriptors / Indicators: Constant;Sore;Grimacing Pain Intervention(s): Limited activity within patient's tolerance;Repositioned    Home Living                      Prior Function  PT Goals (current goals can now be found in the care plan section) Acute Rehab PT Goals Patient Stated Goal: to make pain better PT Goal Formulation: With patient Time For Goal Achievement: 06/04/19 Potential to Achieve Goals: Fair Progress towards PT goals: Progressing toward goals    Frequency    Min 2X/week      PT Plan Current plan remains appropriate    Co-evaluation              AM-PAC PT "6 Clicks" Mobility   Outcome Measure  Help needed turning from your back to your side while in a flat bed without using bedrails?: A Little Help needed moving from lying on your back to  sitting on the side of a flat bed without using bedrails?: A Little Help needed moving to and from a bed to a chair (including a wheelchair)?: A Little Help needed standing up from a chair using your arms (e.g., wheelchair or bedside chair)?: A Little Help needed to walk in hospital room?: A Lot Help needed climbing 3-5 steps with a railing? : A Lot 6 Click Score: 16    End of Session   Activity Tolerance: Patient limited by pain Patient left: in bed;with bed alarm set Nurse Communication: Mobility status PT Visit Diagnosis: Muscle weakness (generalized) (M62.81);Difficulty in walking, not elsewhere classified (R26.2);Pain Pain - Right/Left: Left Pain - part of body: Hip     Time: 1535-1600 PT Time Calculation (min) (ACUTE ONLY): 25 min  Charges:  $Gait Training: 23-37 mins                     Greggory Stallion, [place], DPT [phone]    Veronica Hubbard 05/23/2019, 4:29 PM

## 2019-05-23 NOTE — NC FL2 (Signed)
[place] LEVEL OF CARE SCREENING TOOL     IDENTIFICATION  Patient Name: Veronica Hubbard Birthdate: 09/10/1935 Sex: female Admission Date (Current Location): 05/20/2019  [facility] and [place] Number:      Facility and Address:         Provider Number:    Attending Physician Name and Address:  Max Sane, MD  Relative Name and Phone Number:       Current Level of Care:   Recommended Level of Care:   Prior Approval Number:    Date Approved/Denied:   PASRR Number:    Discharge Plan:      Current Diagnoses: Patient Active Problem List   Diagnosis Date Noted  . MDS (myelodysplastic syndrome) ([place])   . History of compression fracture of spine   . Palliative care by specialist   . Severe back pain 05/20/2019  . Compression fracture of lumbar spine, non-traumatic ([place]) 05/11/2019  . Intractable pain 04/19/2019  . AAA (abdominal aortic aneurysm) without rupture ([place]) 04/09/2017  . Bilateral carotid artery stenosis 04/09/2017  . Thoracic aortic aneurysm without rupture ([place]) 04/09/2017  . Hyponatremia 02/23/2016  . Chest pain 02/23/2016  . Gastroenteritis due to norovirus 02/09/2016    Orientation RESPIRATION BLADDER Height & Weight            Weight: 57.3 kg Height:  5\' 3"  (160 cm)  BEHAVIORAL SYMPTOMS/MOOD NEUROLOGICAL BOWEL NUTRITION STATUS           AMBULATORY STATUS COMMUNICATION OF NEEDS Skin                               Personal Care Assistance Level of Assistance              Functional Limitations Info             SPECIAL CARE FACTORS FREQUENCY                       Contractures      Additional Factors Info                  Current Medications (05/23/2019):  This is the current hospital active medication list Current Facility-Administered Medications  Medication Dose Route Frequency Provider Last Rate Last Dose  . 0.9 %  sodium chloride infusion  250 mL Intravenous PRN Max Sane, MD      .  acetaminophen (TYLENOL) tablet 650 mg  650 mg Oral Q6H PRN Max Sane, MD   650 mg at 05/21/19 1242   Or  . acetaminophen (TYLENOL) suppository 650 mg  650 mg Rectal Q6H PRN Max Sane, MD      . acetaminophen (TYLENOL) tablet 650 mg  650 mg Oral Q6H Mahan, Wayna Chalet, NP   650 mg at 05/23/19 0951  . allopurinol (ZYLOPRIM) tablet 200 mg  200 mg Oral Daily Max Sane, MD   200 mg at 05/23/19 0951  . arformoterol (BROVANA) nebulizer solution 15 mcg  15 mcg Nebulization BID Max Sane, MD   15 mcg at 05/23/19 0733  . bisacodyl (DULCOLAX) EC tablet 5 mg  5 mg Oral Daily PRN Max Sane, MD   5 mg at 05/21/19 1201  . budesonide (PULMICORT) nebulizer solution 0.5 mg  0.5 mg Nebulization BID Max Sane, MD   0.5 mg at 05/23/19 0739  . calcitonin (salmon) (MIACALCIN/FORTICAL) nasal spray 1 spray  1 spray Alternating Nares Daily Earlie Counts,  NP   1 spray at 05/23/19 0953  . cholecalciferol (VITAMIN D3) tablet 1,000 Units  1,000 Units Oral Daily Max Sane, MD   1,000 Units at 05/23/19 0952  . citalopram (CELEXA) tablet 20 mg  20 mg Oral Daily Max Sane, MD   20 mg at 05/23/19 0952  . cyclobenzaprine (FLEXERIL) tablet 5-10 mg  5-10 mg Oral QHS PRN Max Sane, MD   10 mg at 05/21/19 2238  . docusate sodium (COLACE) capsule 100 mg  100 mg Oral BID Max Sane, MD   100 mg at 05/23/19 0952  . ezetimibe (ZETIA) tablet 10 mg  10 mg Oral QHS Max Sane, MD   10 mg at 05/22/19 2036  . feeding supplement (ENSURE ENLIVE) (ENSURE ENLIVE) liquid 237 mL  237 mL Oral TID BM Manuella Ghazi, [place], MD      . hydrALAZINE (APRESOLINE) tablet 50 mg  50 mg Oral Q8H Max Sane, MD   50 mg at 05/23/19 9449  . isosorbide mononitrate (IMDUR) 24 hr tablet 30 mg  30 mg Oral Daily Max Sane, MD   30 mg at 05/22/19 0809  . levothyroxine (SYNTHROID) tablet 150 mcg  150 mcg Oral QAC breakfast Max Sane, MD   150 mcg at 05/23/19 6759  . lidocaine (LIDODERM) 5 % 2 patch  2 patch Transdermal Daily Earlie Counts, NP   2 patch at  05/23/19 0951  . Melatonin TABS 5 mg  5 mg Oral QHS Max Sane, MD   5 mg at 05/22/19 2036  . metoprolol tartrate (LOPRESSOR) tablet 25 mg  25 mg Oral BID Max Sane, MD   25 mg at 05/22/19 2037  . ondansetron (ZOFRAN) tablet 4 mg  4 mg Oral Q6H PRN Max Sane, MD   4 mg at 05/22/19 2037   Or  . ondansetron (ZOFRAN) injection 4 mg  4 mg Intravenous Q6H PRN Max Sane, MD   4 mg at 05/21/19 1243  . oxyCODONE-acetaminophen (PERCOCET) 7.5-325 MG per tablet 1 tablet  1 tablet Oral Q6H PRN Max Sane, MD   1 tablet at 05/23/19 1229  . pantoprazole (PROTONIX) EC tablet 40 mg  40 mg Oral BID Max Sane, MD   40 mg at 05/23/19 0952  . polyethylene glycol (MIRALAX / GLYCOLAX) packet 17 g  17 g Oral Daily PRN Max Sane, MD   17 g at 05/21/19 1619  . senna-docusate (Senokot-S) tablet 1 tablet  1 tablet Oral BID Max Sane, MD   1 tablet at 05/23/19 0952  . sodium chloride flush (NS) 0.9 % injection 3 mL  3 mL Intravenous Q12H Max Sane, MD   3 mL at 05/23/19 0954  . sodium chloride flush (NS) 0.9 % injection 3 mL  3 mL Intravenous PRN Max Sane, MD      . traZODone (DESYREL) tablet 25 mg  25 mg Oral QHS PRN Max Sane, MD   25 mg at 05/22/19 2037  . umeclidinium bromide (INCRUSE ELLIPTA) 62.5 MCG/INH 1 puff  1 puff Inhalation Daily Max Sane, MD   1 puff at 05/23/19 1638     Discharge Medications: Please see discharge summary for a list of discharge medications.  Relevant Imaging Results:  Relevant Lab Results:   Additional Information    Shelbie Hutching, RN

## 2019-05-23 NOTE — NC FL2 (Signed)
[place] LEVEL OF CARE SCREENING TOOL     IDENTIFICATION  Patient Name: Veronica Hubbard Birthdate: [birth year] Sex: female Admission Date (Current Location): 05/20/2019  [place] and [place] Number:  Engineering geologist and Address:  [facility], [address] [place], [place] [address]      Provider Number: [id number]  Attending Physician Name and Address:  Max Sane, MD  Relative Name and Phone Number:  [name]- sister  [phone]    Current Level of Care: Hospital Recommended Level of Care: [place] Prior Approval Number:    Date Approved/Denied:   PASRR Number: [id number]  Discharge Plan: SNF    Current Diagnoses: Patient Active Problem List   Diagnosis Date Noted  . MDS (myelodysplastic syndrome) ([place])   . History of compression fracture of spine   . Palliative care by specialist   . Severe back pain 05/20/2019  . Compression fracture of lumbar spine, non-traumatic ([place]) 05/11/2019  . Intractable pain 04/19/2019  . AAA (abdominal aortic aneurysm) without rupture ([place]) 04/09/2017  . Bilateral carotid artery stenosis 04/09/2017  . Thoracic aortic aneurysm without rupture ([place]) 04/09/2017  . Hyponatremia 02/23/2016  . Chest pain 02/23/2016  . Gastroenteritis due to norovirus 02/09/2016    Orientation RESPIRATION BLADDER Height & Weight     Self, Time, Situation, Place  Normal Continent Weight: 57.3 kg Height:  5\' 3"  (160 cm)  BEHAVIORAL SYMPTOMS/MOOD NEUROLOGICAL BOWEL NUTRITION STATUS      Continent Diet  AMBULATORY STATUS COMMUNICATION OF NEEDS Skin   Extensive Assist Verbally Normal                       Personal Care Assistance Level of Assistance    Bathing Assistance: Limited assistance Feeding assistance: Independent Dressing Assistance: Limited assistance     Functional Limitations Info             SPECIAL CARE FACTORS FREQUENCY  PT (By licensed PT), OT (By  licensed OT)     PT Frequency: 5 times per week OT Frequency: 5 times per week            Contractures Contractures Info: Not present    Additional Factors Info  Code Status, Allergies Code Status Info: Full Allergies Info: Aspirin, celecoxib, iodinated diagnositic agents, losartan, morphine, verapamil           Current Medications (05/23/2019):  This is the current hospital active medication list Current Facility-Administered Medications  Medication Dose Route Frequency Provider Last Rate Last Dose  . 0.9 %  sodium chloride infusion  250 mL Intravenous PRN Max Sane, MD      . acetaminophen (TYLENOL) tablet 650 mg  650 mg Oral Q6H PRN Max Sane, MD   650 mg at 05/21/19 1242   Or  . acetaminophen (TYLENOL) suppository 650 mg  650 mg Rectal Q6H PRN Max Sane, MD      . acetaminophen (TYLENOL) tablet 650 mg  650 mg Oral Q6H Mahan, Wayna Chalet, NP   650 mg at 05/23/19 0951  . allopurinol (ZYLOPRIM) tablet 200 mg  200 mg Oral Daily Max Sane, MD   200 mg at 05/23/19 0951  . arformoterol (BROVANA) nebulizer solution 15 mcg  15 mcg Nebulization BID Max Sane, MD   15 mcg at 05/23/19 0733  . bisacodyl (DULCOLAX) EC tablet 5 mg  5 mg Oral Daily PRN Max Sane, MD   5 mg at 05/21/19 1201  . budesonide (PULMICORT) nebulizer solution  0.5 mg  0.5 mg Nebulization BID Max Sane, MD   0.5 mg at 05/23/19 0739  . calcitonin (salmon) (MIACALCIN/FORTICAL) nasal spray 1 spray  1 spray Alternating Nares Daily Earlie Counts, NP   1 spray at 05/23/19 0953  . cholecalciferol (VITAMIN D3) tablet 1,000 Units  1,000 Units Oral Daily Max Sane, MD   1,000 Units at 05/23/19 0952  . citalopram (CELEXA) tablet 20 mg  20 mg Oral Daily Max Sane, MD   20 mg at 05/23/19 0952  . cyclobenzaprine (FLEXERIL) tablet 5-10 mg  5-10 mg Oral QHS PRN Max Sane, MD   10 mg at 05/21/19 2238  . docusate sodium (COLACE) capsule 100 mg  100 mg Oral BID Max Sane, MD   100 mg at 05/23/19 0952  . ezetimibe  (ZETIA) tablet 10 mg  10 mg Oral QHS Max Sane, MD   10 mg at 05/22/19 2036  . feeding supplement (ENSURE ENLIVE) (ENSURE ENLIVE) liquid 237 mL  237 mL Oral TID BM Manuella Ghazi, Vipul, MD      . hydrALAZINE (APRESOLINE) tablet 50 mg  50 mg Oral Q8H Max Sane, MD   50 mg at 05/23/19 3016  . isosorbide mononitrate (IMDUR) 24 hr tablet 30 mg  30 mg Oral Daily Max Sane, MD   30 mg at 05/22/19 0809  . levothyroxine (SYNTHROID) tablet 150 mcg  150 mcg Oral QAC breakfast Max Sane, MD   150 mcg at 05/23/19 0109  . lidocaine (LIDODERM) 5 % 2 patch  2 patch Transdermal Daily Earlie Counts, NP   2 patch at 05/23/19 0951  . Melatonin TABS 5 mg  5 mg Oral QHS Max Sane, MD   5 mg at 05/22/19 2036  . metoprolol tartrate (LOPRESSOR) tablet 25 mg  25 mg Oral BID Max Sane, MD   25 mg at 05/22/19 2037  . ondansetron (ZOFRAN) tablet 4 mg  4 mg Oral Q6H PRN Max Sane, MD   4 mg at 05/22/19 2037   Or  . ondansetron (ZOFRAN) injection 4 mg  4 mg Intravenous Q6H PRN Max Sane, MD   4 mg at 05/21/19 1243  . oxyCODONE-acetaminophen (PERCOCET) 7.5-325 MG per tablet 1 tablet  1 tablet Oral Q6H PRN Max Sane, MD   1 tablet at 05/23/19 1229  . pantoprazole (PROTONIX) EC tablet 40 mg  40 mg Oral BID Max Sane, MD   40 mg at 05/23/19 0952  . polyethylene glycol (MIRALAX / GLYCOLAX) packet 17 g  17 g Oral Daily PRN Max Sane, MD   17 g at 05/21/19 1619  . senna-docusate (Senokot-S) tablet 1 tablet  1 tablet Oral BID Max Sane, MD   1 tablet at 05/23/19 0952  . sodium chloride flush (NS) 0.9 % injection 3 mL  3 mL Intravenous Q12H Max Sane, MD   3 mL at 05/23/19 0954  . sodium chloride flush (NS) 0.9 % injection 3 mL  3 mL Intravenous PRN Max Sane, MD      . traZODone (DESYREL) tablet 25 mg  25 mg Oral QHS PRN Max Sane, MD   25 mg at 05/22/19 2037  . umeclidinium bromide (INCRUSE ELLIPTA) 62.5 MCG/INH 1 puff  1 puff Inhalation Daily Max Sane, MD   1 puff at 05/23/19 3235     Discharge  Medications: Please see discharge summary for a list of discharge medications.  Relevant Imaging Results:  Relevant Lab Results:   Additional Information [id number]  Shelbie Hutching, RN

## 2019-05-23 NOTE — Discharge Instructions (Signed)
Back Injury Prevention Back injuries can be very painful. They can also be difficult to heal. After having one back injury, you are more likely to have another one again. It is important to learn how to avoid injuring or re-injuring your back. The following tips can help you to prevent a back injury. What actions can I take to prevent back injuries? Changes in your diet Talk with your doctor about what to eat. Some foods can make the bones strong.  Talk with your doctor about how much calcium and vitamin D you need each day. These nutrients help to prevent weakening of the bones (osteoporosis).  Eat foods that have calcium. These include: ? Dairy products. ? Green leafy vegetables. ? Food and drinks that have had calcium added to them (fortified).  Eat foods that have vitamin D. These include: ? Milk. ? Food and drinks that have had vitamin D added to them.  Take other supplements and vitamins only as told by your doctor. Physical fitness Physical fitness makes your bones and muscles strong. It also improves your balance and strength.  Exercise for 30 minutes per day on most days of the week, or as told by your doctor. Make sure to: ? Do aerobic exercises, such as walking, jogging, biking, or swimming. ? Do exercises that increase balance and strength, such as tai chi and yoga. ? Do stretching exercises. This helps with flexibility. ? Develop strong belly (abdominal) muscles. Your belly muscles help to support your back.  Stay at a healthy weight. This lowers your risk of a back injury. Good posture        Prevent back injuries by developing and maintaining a good posture. To do this:  Sit up straight and stand up straight. Avoid leaning forward when you sit or hunching over when you stand.  Choose chairs that have good low-back (lumbar) support.  If you work at a desk: ? Sit close to it so you do not need to lean over. ? Keep your chin tucked in. ? Keep your neck drawn  back. ? Keep your elbows bent so that your arms make a corner (right angle).  When you drive: ? Sit high and close to the steering wheel. Add a low-back support to your car seat, if needed. ? Take breaks every hour if you are driving for long periods of time.  Avoid sitting or standing in one position for very long. Take breaks to get up, stretch, and walk around at least once every hour.  Sleep on your side with your knees slightly bent, or sleep on your back with a pillow under your knees.  Lifting, twisting, and reaching   Heavy lifting ? Avoid heavy lifting, especially lifting over and over again. If you must do heavy lifting: ? Stretch before lifting. ? Work slowly. ? Rest between lifts. ? Use a tool such as a cart or a dolly to move objects if one is available. ? Make several small trips instead of carrying one heavy load. ? Ask for help when you need it, especially when moving big objects. ? Follow these steps when lifting: ? Stand with your feet shoulder-width apart. ? Get as close to the object as you can. Do not pick up a heavy object that is far from your body. ? Use handles or lifting straps if they are available. ? Bend at your knees. Squat down, but keep your heels off the floor. ? Keep your shoulders back. Keep your chin tucked in. Keep  your back straight. ? Lift the object slowly while you tighten the muscles in your legs, belly, and bottom. Keep the object as close to the center of your body as possible. ? Follow these steps when putting down a heavy load: ? Stand with your feet shoulder-width apart. ? Lower the object slowly while you tighten the muscles in your legs, belly, and bottom. Keep the object as close to the center of your body as possible. ? Keep your shoulders back. Keep your chin tucked in. Keep your back straight. ? Bend at your knees. Squat down, but keep your heels off the floor. ? Use handles or lifting straps if they are available.  Twisting and  reaching ? Avoid lifting heavy objects above your waist. ? Do not twist at your waist while you are lifting or carrying a load. If you need to turn, move your feet. ? Do not bend over without bending at your knees. ? Avoid reaching over your head, across a table, or for an object on a high surface. Other things to do   Avoid wet floors and icy ground. Keep sidewalks clear of ice to prevent falls.  Do not sleep on a mattress that is too soft or too hard.  Store heavier objects on shelves at waist level.  Store lighter objects on lower or higher shelves.  Find ways to lower your stress, such as: ? Exercise. ? Massage. ? Relaxation techniques.  Talk with your doctor if you feel anxious or depressed. These conditions can make back pain worse.  Wear flat heel shoes with cushioned soles.  Use both shoulder straps when carrying a backpack.  Do not use any products that contain nicotine or tobacco, such as cigarettes and e-cigarettes. If you need help quitting, ask your doctor. Summary  Back injuries can be very painful and difficult to heal.  You can keep your back healthy by making certain changes. These include eating foods that make bones strong, working on being physically fit, developing a good posture, and lifting heavy objects in a safe way. This information is not intended to replace advice given to you by your health care provider. Make sure you discuss any questions you have with your health care provider. Document Released: 04/10/2008 Document Revised: 12/14/2017 Document Reviewed: 12/14/2017 Elsevier Patient Education  2020 Elsevier Inc.  

## 2019-05-23 NOTE — TOC Progression Note (Signed)
Transition of Care [facility]) - Progression Note    Patient Details  Name: Veronica Hubbard MRN: [id number] Date of Birth: [birth year]  Transition of Care [facility] CM/SW Contact  Shelbie Hutching, RN Phone Number: 05/23/2019, 3:18 PM  Clinical Narrative:    [name] reports that they will have a bed on Monday.  [facility] authorization started.     Expected Discharge Plan: [place]    Expected Discharge Plan and Services Expected Discharge Plan: [place] Choice: Home Health   Expected Discharge Date: 05/23/19                         HH Arranged: PT HH Agency: [place] Date [place]: 05/20/19 Time HH Agency Contacted: 27 Representative spoke with at [place]: Merrily Pew   Social Determinants of Health (SDOH) Interventions    Readmission Risk Interventions No flowsheet data found.

## 2019-05-24 LAB — CBC WITH DIFFERENTIAL/PLATELET
Abs Immature Granulocytes: 2.05 10*3/uL — ABNORMAL HIGH (ref 0.00–0.07)
Basophils Absolute: 0.6 10*3/uL — ABNORMAL HIGH (ref 0.0–0.1)
Basophils Relative: 3 %
Eosinophils Absolute: 1.5 10*3/uL — ABNORMAL HIGH (ref 0.0–0.5)
Eosinophils Relative: 7 %
HCT: 37.7 % (ref 36.0–46.0)
Hemoglobin: 11.9 g/dL — ABNORMAL LOW (ref 12.0–15.0)
Immature Granulocytes: 10 %
Lymphocytes Relative: 15 %
Lymphs Abs: 3.1 10*3/uL (ref 0.7–4.0)
MCH: 27.3 pg (ref 26.0–34.0)
MCHC: 31.6 g/dL (ref 30.0–36.0)
MCV: 86.5 fL (ref 80.0–100.0)
Monocytes Absolute: 0.9 10*3/uL (ref 0.1–1.0)
Monocytes Relative: 5 %
Neutro Abs: 11.9 10*3/uL — ABNORMAL HIGH (ref 1.7–7.7)
Neutrophils Relative %: 60 %
Platelets: 393 10*3/uL (ref 150–400)
RBC: 4.36 MIL/uL (ref 3.87–5.11)
RDW: 21.2 % — ABNORMAL HIGH (ref 11.5–15.5)
Smear Review: NORMAL
WBC: 20 10*3/uL — ABNORMAL HIGH (ref 4.0–10.5)
nRBC: 0.3 % — ABNORMAL HIGH (ref 0.0–0.2)

## 2019-05-24 LAB — BASIC METABOLIC PANEL
Anion gap: 10 (ref 5–15)
BUN: 19 mg/dL (ref 8–23)
CO2: 24 mmol/L (ref 22–32)
Calcium: 10.7 mg/dL — ABNORMAL HIGH (ref 8.9–10.3)
Chloride: 103 mmol/L (ref 98–111)
Creatinine, Ser: 1.18 mg/dL — ABNORMAL HIGH (ref 0.44–1.00)
GFR calc Af Amer: 50 mL/min — ABNORMAL LOW (ref >60)
GFR calc non Af Amer: 43 mL/min — ABNORMAL LOW (ref >60)
Glucose, Bld: 97 mg/dL (ref 70–99)
Potassium: 3.6 mmol/L (ref 3.5–5.1)
Sodium: 137 mmol/L (ref 135–145)

## 2019-05-24 LAB — GLUCOSE, CAPILLARY: Glucose-Capillary: 98 mg/dL (ref 70–99)

## 2019-05-24 MED ORDER — MORPHINE SULFATE (PF) 2 MG/ML IV SOLN
1.0000 mg | INTRAVENOUS | Status: DC | PRN
  Administered 2019-05-24 (×2): 2 mg via INTRAVENOUS
  Administered 2019-05-24: 1 mg via INTRAVENOUS
  Administered 2019-05-25 – 2019-05-26 (×2): 2 mg via INTRAVENOUS
  Filled 2019-05-24 (×5): qty 1

## 2019-05-24 NOTE — Progress Notes (Signed)
[place] [facility] [place] NAME: Veronica Hubbard    MR#:  [id number]  DATE OF BIRTH:  07/13/1935  SUBJECTIVE:  CHIEF COMPLAINT:   Chief Complaint  Patient presents with  . Back Pain  Patient seen at the bedside. Still complaining of back pain worse with movement. She continues to work with PT pending placement at [facility].  REVIEW OF SYSTEMS:  Review of Systems  Constitutional: Negative for diaphoresis, fever, malaise/fatigue and weight loss.  HENT: Negative for ear discharge, ear pain, hearing loss, nosebleeds, sore throat and tinnitus.   Eyes: Negative for blurred vision and pain.  Respiratory: Negative for cough, hemoptysis, shortness of breath and wheezing.   Cardiovascular: Negative for chest pain, palpitations, orthopnea and leg swelling.  Gastrointestinal: Negative for abdominal pain, blood in stool, constipation, diarrhea, heartburn, nausea and vomiting.  Genitourinary: Negative for dysuria, frequency and urgency.  Musculoskeletal: Positive for back pain. Negative for myalgias.  Skin: Negative for itching and rash.  Neurological: Negative for dizziness, tingling, tremors, focal weakness, seizures, weakness and headaches.  Psychiatric/Behavioral: Negative for depression. The patient is not nervous/anxious.    DRUG ALLERGIES:   Allergies  Allergen Reactions  . Aspirin Shortness Of Breath  . Celecoxib Shortness Of Breath  . Iodinated Diagnostic Agents Other (See Comments)    Reaction:  Decreased kidney function  . Losartan Itching  . Morphine Nausea And Vomiting  . Verapamil Itching   VITALS:  Blood pressure 133/60, pulse (!) 55, temperature 98.2 F (36.8 C), temperature source Oral, resp. rate 16, height 5\' 3"  (1.6 m), weight 59.1 kg, SpO2 95 %. PHYSICAL EXAMINATION:  Physical Exam HENT:     Head: Normocephalic and atraumatic.  Eyes:     Conjunctiva/sclera: Conjunctivae normal.     Pupils: Pupils are equal,  round, and reactive to light.  Neck:     Musculoskeletal: Normal range of motion and neck supple.     Thyroid: No thyromegaly.     Trachea: No tracheal deviation.  Cardiovascular:     Rate and Rhythm: Normal rate and regular rhythm.     Heart sounds: Normal heart sounds.  Pulmonary:     Effort: Pulmonary effort is normal. No respiratory distress.     Breath sounds: Normal breath sounds. No wheezing.  Chest:     Chest wall: No tenderness.  Abdominal:     General: Bowel sounds are normal. There is no distension.     Palpations: Abdomen is soft.     Tenderness: There is no abdominal tenderness.  Musculoskeletal: Normal range of motion.  Skin:    General: Skin is warm and dry.     Findings: No rash.  Neurological:     Mental Status: She is alert and oriented to person, place, and time.     Cranial Nerves: No cranial nerve deficit.    LABORATORY PANEL:  Female CBC Recent Labs  Lab 05/24/19 0834  WBC 20.0*  HGB 11.9*  HCT 37.7  PLT 393   ------------------------------------------------------------------------------------------------------------------ Chemistries  Recent Labs  Lab 05/24/19 0834  NA 137  K 3.6  CL 103  CO2 24  GLUCOSE 97  BUN 19  CREATININE 1.18*  CALCIUM 10.7*   RADIOLOGY:  No results found. ASSESSMENT AND PLAN:  Patient is an 84 year old female witha known history of hypertension,hypothyroidism,chronic leukocytosis secondary tohistory of MDS/MPN and was on Jakafi in the pastand recent discharge from the hospital following kyphoplasty for L4  compression fracture begin readmitted for worsening back pain.   1.Worsening back pain  - s/p L1-L2 kyphoplasty done 05/13/19. - recent  L3 kyphoplasty on 04/10/2019 And L3 and L4 kyphoplasty on 04/22/2019, both by Dr. Rudene Christians - CT lumbar spine and abd-pelvis not showing any acute patho - MRI of the lumbar spine  showing any other acute pathology either - Continue Flexeril - PT following - Per orthopedic Dr.  Rudene Christians, recommends anesthesia evaluation for possible epidural injection while she is here. - Consult placed to Dr. Andree Elk for possible epidural injection while here  2.Hypertension- stable - continue metoprolol, Hydralazine and Imdur  3.Hypothyroidism. - Continue Synthroid  4.History of depression-Stable. -Continue Celexa.  5.History of asthma- Stable.   All the records are reviewed and case discussed with Care Management/Social Worker. Management plans discussed with the patient, orthopedics and they are in agreement.  CODE STATUS: Full Code  TOTAL TIME TAKING CARE OF THIS PATIENT: 35 minutes.   More than 50% of the time was spent in counseling/coordination of care: YES  POSSIBLE D/C IN 1-2 DAYS, DEPENDING ON CLINICAL CONDITION.  Rufina Falco, DNP, FNP-BC [facility] Nurse Practitioner Between 7am to 6pm - Pager - [phone]  After 6pm go to [email or web address] - password [email or web address] [facility]  Office  [phone]  CC: Primary care physician; Zhou-Talbert, Elwyn Lade, MD  Note: This dictation was prepared with Dragon dictation along with smaller phrase technology. Any transcriptional errors that result from this process are unintentional.

## 2019-05-25 ENCOUNTER — Inpatient Hospital Stay: Payer: Medicare HMO

## 2019-05-25 LAB — CBC WITH DIFFERENTIAL/PLATELET
Abs Immature Granulocytes: 2.61 10*3/uL — ABNORMAL HIGH (ref 0.00–0.07)
Basophils Absolute: 0.9 10*3/uL — ABNORMAL HIGH (ref 0.0–0.1)
Basophils Relative: 3 %
Eosinophils Absolute: 1.7 10*3/uL — ABNORMAL HIGH (ref 0.0–0.5)
Eosinophils Relative: 7 %
HCT: 42.1 % (ref 36.0–46.0)
Hemoglobin: 13.2 g/dL (ref 12.0–15.0)
Immature Granulocytes: 10 %
Lymphocytes Relative: 13 %
Lymphs Abs: 3.2 10*3/uL (ref 0.7–4.0)
MCH: 27.6 pg (ref 26.0–34.0)
MCHC: 31.4 g/dL (ref 30.0–36.0)
MCV: 87.9 fL (ref 80.0–100.0)
Monocytes Absolute: 1.2 10*3/uL — ABNORMAL HIGH (ref 0.1–1.0)
Monocytes Relative: 5 %
Neutro Abs: 15.8 10*3/uL — ABNORMAL HIGH (ref 1.7–7.7)
Neutrophils Relative %: 62 %
Platelets: 485 10*3/uL — ABNORMAL HIGH (ref 150–400)
RBC: 4.79 MIL/uL (ref 3.87–5.11)
RDW: 21.5 % — ABNORMAL HIGH (ref 11.5–15.5)
Smear Review: NORMAL
WBC: 25.4 10*3/uL — ABNORMAL HIGH (ref 4.0–10.5)
nRBC: 0.3 % — ABNORMAL HIGH (ref 0.0–0.2)

## 2019-05-25 LAB — COMPREHENSIVE METABOLIC PANEL
ALT: 8 U/L (ref 0–44)
AST: 20 U/L (ref 15–41)
Albumin: 4.2 g/dL (ref 3.5–5.0)
Alkaline Phosphatase: 81 U/L (ref 38–126)
Anion gap: 9 (ref 5–15)
BUN: 21 mg/dL (ref 8–23)
CO2: 24 mmol/L (ref 22–32)
Calcium: 11.1 mg/dL — ABNORMAL HIGH (ref 8.9–10.3)
Chloride: 102 mmol/L (ref 98–111)
Creatinine, Ser: 1.37 mg/dL — ABNORMAL HIGH (ref 0.44–1.00)
GFR calc Af Amer: 42 mL/min — ABNORMAL LOW (ref >60)
GFR calc non Af Amer: 36 mL/min — ABNORMAL LOW (ref >60)
Glucose, Bld: 115 mg/dL — ABNORMAL HIGH (ref 70–99)
Potassium: 3.8 mmol/L (ref 3.5–5.1)
Sodium: 135 mmol/L (ref 135–145)
Total Bilirubin: 1.2 mg/dL (ref 0.3–1.2)
Total Protein: 6.8 g/dL (ref 6.5–8.1)

## 2019-05-25 LAB — URINALYSIS, COMPLETE (UACMP) WITH MICROSCOPIC
Bacteria, UA: NONE SEEN
Bilirubin Urine: NEGATIVE
Glucose, UA: NEGATIVE mg/dL
Hgb urine dipstick: NEGATIVE
Ketones, ur: NEGATIVE mg/dL
Nitrite: NEGATIVE
Protein, ur: NEGATIVE mg/dL
Specific Gravity, Urine: 1.014 (ref 1.005–1.030)
Squamous Epithelial / HPF: NONE SEEN (ref 0–5)
pH: 6 (ref 5.0–8.0)

## 2019-05-25 MED ORDER — METOCLOPRAMIDE HCL 5 MG/ML IJ SOLN
5.0000 mg | Freq: Once | INTRAMUSCULAR | Status: AC
  Administered 2019-05-25: 5 mg via INTRAVENOUS
  Filled 2019-05-25: qty 2

## 2019-05-25 MED ORDER — SODIUM CHLORIDE 0.9 % IV SOLN
INTRAVENOUS | Status: DC
  Administered 2019-05-25 (×2): via INTRAVENOUS

## 2019-05-25 NOTE — Progress Notes (Addendum)
[facility] [place] NAME: Veronica Hubbard    MR#:  [id number]  DATE OF BIRTH:  08/27/1935  SUBJECTIVE:  CHIEF COMPLAINT:   Chief Complaint  Patient presents with  . Back Pain   Patient seen at the bedside. She is complaining of not feeling well overall. She is sitting up in bed complaining of abdominal pain, chills and nausea. Denies vomiting, diarrhea, chest pain, shortness of breath or any other associated symptoms. Patient report symptoms started this morning.  Back pain unchanged but not getting worse.  REVIEW OF SYSTEMS:  Review of Systems  Constitutional: Negative for diaphoresis, fever, malaise/fatigue and weight loss.  HENT: Negative for ear discharge, ear pain, hearing loss, nosebleeds, sore throat and tinnitus.   Eyes: Negative for blurred vision and pain.  Respiratory: Negative for cough, hemoptysis, shortness of breath and wheezing.   Cardiovascular: Negative for chest pain, palpitations, orthopnea and leg swelling.  Gastrointestinal: Positive for abdominal pain and nausea. Negative for blood in stool, constipation, diarrhea, heartburn and vomiting.  Genitourinary: Negative for dysuria, frequency and urgency.  Musculoskeletal: Positive for back pain. Negative for myalgias.  Skin: Negative for itching and rash.  Neurological: Negative for dizziness, tingling, tremors, focal weakness, seizures, weakness and headaches.  Psychiatric/Behavioral: Negative for depression. The patient is not nervous/anxious.    DRUG ALLERGIES:   Allergies  Allergen Reactions  . Aspirin Shortness Of Breath  . Celecoxib Shortness Of Breath  . Iodinated Diagnostic Agents Other (See Comments)    Reaction:  Decreased kidney function  . Losartan Itching  . Morphine Nausea And Vomiting  . Verapamil Itching   VITALS:  Blood pressure (!) 143/52, pulse (!) 55, temperature 98.4 F (36.9 C), temperature source Oral, resp. rate 17, height 5' 3"   (1.6 m), weight 57.6 kg, SpO2 92 %. PHYSICAL EXAMINATION:  Physical Exam HENT:     Head: Normocephalic and atraumatic.  Eyes:     Conjunctiva/sclera: Conjunctivae normal.     Pupils: Pupils are equal, round, and reactive to light.  Neck:     Musculoskeletal: Normal range of motion and neck supple.     Thyroid: No thyromegaly.     Trachea: No tracheal deviation.  Cardiovascular:     Rate and Rhythm: Normal rate and regular rhythm.     Heart sounds: Normal heart sounds.  Pulmonary:     Effort: Pulmonary effort is normal. No respiratory distress.     Breath sounds: Normal breath sounds. No wheezing.  Chest:     Chest wall: No tenderness.  Abdominal:     General: Bowel sounds are normal. There is no distension.     Palpations: Abdomen is soft.     Tenderness: There is no abdominal tenderness.  Musculoskeletal: Normal range of motion.  Skin:    General: Skin is warm and dry.     Findings: No rash.  Neurological:     Mental Status: She is alert and oriented to person, place, and time.     Cranial Nerves: No cranial nerve deficit.    LABORATORY PANEL:  Female CBC Recent Labs  Lab 05/25/19 0755  WBC 25.4*  HGB 13.2  HCT 42.1  PLT 485*   ------------------------------------------------------------------------------------------------------------------ Chemistries  Recent Labs  Lab 05/25/19 0755  NA 135  K 3.8  CL 102  CO2 24  GLUCOSE 115*  BUN 21  CREATININE 1.37*  CALCIUM 11.1*  AST 20  ALT 8  ALKPHOS 81  BILITOT 1.2  RADIOLOGY:  Ct Abdomen Pelvis Wo Contrast  Result Date: 05/25/2019 CLINICAL DATA:  Generalized abdominal pain. EXAM: CT ABDOMEN AND PELVIS WITHOUT CONTRAST TECHNIQUE: Multidetector CT imaging of the abdomen and pelvis was performed following the standard protocol without IV contrast. COMPARISON:  CT scan of May 20, 2019. FINDINGS: Lower chest: No acute abnormality. Hepatobiliary: No focal liver abnormality is seen. No gallstones, gallbladder wall  thickening, or biliary dilatation. Pancreas: Unremarkable. No pancreatic ductal dilatation or surrounding inflammatory changes. Spleen: Stable splenomegaly is noted. Adrenals/Urinary Tract: Adrenal glands appear normal. Stable 1 cm nonobstructive calculus seen in lower pole collecting system of right kidney. Stable right renal cyst. No hydronephrosis or renal obstruction is noted. Urinary bladder is unremarkable. Stomach/Bowel: The stomach appears normal. There is no evidence of bowel obstruction or inflammation. Sigmoid diverticulosis is noted. The appendix is not clearly visualized. Vascular/Lymphatic: 3.9 cm infrarenal abdominal aortic aneurysm is noted. No adenopathy is noted. Atherosclerosis of abdominal aorta is noted. Reproductive: Status post hysterectomy. No adnexal masses. Other: No abdominal wall hernia or abnormality. No abdominopelvic ascites. Musculoskeletal: No acute or significant osseous findings. IMPRESSION: Stable splenomegaly. Stable 3.9 cm infrarenal abdominal aortic aneurysm. Recommend followup by [place] in 2 years. This recommendation follows ACR consensus guidelines: White Paper of the ACR Incidental Findings Committee II on Vascular Findings. J Am Coll Radiol 2013; 10:789-794. Stable nonobstructive right renal calculus. No hydronephrosis or renal obstruction is noted. No acute abnormality is noted in the abdomen or pelvis. Aortic Atherosclerosis (ICD10-I70.0). Electronically Signed   By: Marijo Conception M.D.   On: 05/25/2019 09:35   ASSESSMENT AND PLAN:  Patient is an 84 year old female witha known history of hypertension,hypothyroidism,chronic leukocytosis secondary tohistory of MDS/MPN and was on Jakafi in the pastand recent discharge from the hospital following kyphoplasty for L4 compression fracture begin readmitted for worsening back pain.   1.Acute onset abdominal pain - patient has hx of gastroenteritis due to norovirus, denies diarrhea or vomiting, WBC elevated this am  however she is also has hx of chronic leukocytosis due to MDS/MPN - CT abdomen and pelvis obtained and shows no acute abnormality - Blood cultures UA pending - Will continue tomonitor - PRN pain meds - Zofran for nausea - Gentle IVFs  2. Back pain  - s/p L1-L2 kyphoplasty done 05/13/19. - recent  L3 kyphoplasty on 04/10/2019 And L3 and L4 kyphoplasty on 04/22/2019, both by Dr. Rudene Christians - CT lumbar spine and abd-pelvis not showing any acute patho - MRI of the lumbar spine  showing any other acute pathology either - Continue Flexeril - PT following, plan to dc on Monday is stable to [facility] - Per orthopedic Dr. Rudene Christians, recommends anesthesia evaluation for possible epidural injection. Will probably get this on an outpatient basis.  3. Acute on Chronic Leukocytosis- Has a history of MDS/MPN and was on Jakafi in the past. - Follows with [facility] as an outpatient.  Underwent bone marrow biopsy in July 2019.   - Work-up per heme-onc - Needs to follow-up with hematologist at [facility] as an outpatient  4.Hypertension- stable - continue metoprolol, Hydralazine and Imdur  5.Hypothyroidism. - Continue Synthroid  6.History of depression-Stable. -Continue Celexa.  7.History of asthma- Stable.   All the records are reviewed and case discussed with Care Management/Social Worker. Management plans discussed with the patient, orthopedics and they are in agreement.  CODE STATUS: Full Code  TOTAL TIME TAKING CARE OF THIS PATIENT: 35 minutes.   More than 50% of the time was spent in counseling/coordination of  care: YES  POSSIBLE D/C IN 1-2 DAYS, DEPENDING ON CLINICAL CONDITION.  Rufina Falco, DNP, FNP-BC [facility] Nurse Practitioner Between 7am to 6pm - Pager - [phone]  After 6pm go to [email or web address] - password [email or web address] [facility]  Office  [phone]  CC: Primary care physician; Zhou-Talbert, Elwyn Lade, MD  Note: This dictation was prepared with  Dragon dictation along with smaller phrase technology. Any transcriptional errors that result from this process are unintentional.

## 2019-05-26 LAB — SARS CORONAVIRUS 2 BY RT PCR (HOSPITAL ORDER, PERFORMED IN ~~LOC~~ HOSPITAL LAB): SARS Coronavirus 2: NEGATIVE

## 2019-05-26 LAB — PATHOLOGIST SMEAR REVIEW

## 2019-05-26 MED ORDER — METOCLOPRAMIDE HCL 5 MG PO TABS: 5.0000 mg | ORAL_TABLET | Freq: Three times a day (TID) | ORAL | 0 refills | Status: AC | PRN

## 2019-05-26 MED ORDER — TRAZODONE HCL 50 MG PO TABS: 25.0000 mg | ORAL_TABLET | Freq: Every evening | ORAL | 0 refills | Status: DC | PRN

## 2019-05-26 NOTE — NC FL2 (Signed)
[place] LEVEL OF CARE SCREENING TOOL     IDENTIFICATION  Patient Name: Veronica Hubbard Birthdate: 1935/10/05 Sex: female Admission Date (Current Location): 05/20/2019  [place] and [place] Number:  Engineering geologist and Address:  [facility], [address], [place], [place] [address]      Provider Number: [id number]  Attending Physician Name and Address:  Lang Snow,*  Relative Name and Phone Number:  [name]- sister  [phone]    Current Level of Care: Hospital Recommended Level of Care: [place] Prior Approval Number:    Date Approved/Denied:   PASRR Number: [id number]  Discharge Plan: SNF    Current Diagnoses: Patient Active Problem List   Diagnosis Date Noted  . MDS (myelodysplastic syndrome) ([place])   . History of compression fracture of spine   . Palliative care by specialist   . Severe back pain 05/20/2019  . Compression fracture of lumbar spine, non-traumatic ([place]) 05/11/2019  . Intractable pain 04/19/2019  . AAA (abdominal aortic aneurysm) without rupture ([place]) 04/09/2017  . Bilateral carotid artery stenosis 04/09/2017  . Thoracic aortic aneurysm without rupture ([place]) 04/09/2017  . Hyponatremia 02/23/2016  . Chest pain 02/23/2016  . Gastroenteritis due to norovirus 02/09/2016    Orientation RESPIRATION BLADDER Height & Weight     Self, Time, Situation, Place  Normal Continent Weight: 57.7 kg Height:  5\' 3"  (160 cm)  BEHAVIORAL SYMPTOMS/MOOD NEUROLOGICAL BOWEL NUTRITION STATUS      Continent Diet  AMBULATORY STATUS COMMUNICATION OF NEEDS Skin   Extensive Assist Verbally Normal                       Personal Care Assistance Level of Assistance    Bathing Assistance: Limited assistance Feeding assistance: Independent Dressing Assistance: Limited assistance     Functional Limitations Info             SPECIAL CARE FACTORS FREQUENCY  PT (By licensed PT), OT  (By licensed OT)     PT Frequency: 5 times per week OT Frequency: 5 times per week            Contractures Contractures Info: Not present    Additional Factors Info  Code Status, Allergies Code Status Info: Full Allergies Info: Aspirin, celecoxib, iodinated diagnositic agents, losartan, morphine, verapamil           STOP taking these medications   budesonide 0.5 MG/2ML nebulizer solution Commonly known as: PULMICORT   enoxaparin 30 MG/0.3ML injection Commonly known as: LOVENOX   ipratropium-albuterol 0.5-2.5 (3) MG/3ML Soln Commonly known as: DUONEB     TAKE these medications   acetaminophen 325 MG tablet Commonly known as: TYLENOL Take 2 tablets (650 mg total) by mouth every 6 (six) hours as needed for mild pain (or Fever >/= 101).   allopurinol 100 MG tablet Commonly known as: ZYLOPRIM Take 200 mg by mouth daily.   citalopram 20 MG tablet Commonly known as: CELEXA Take 20 mg by mouth daily.   cyclobenzaprine 5 MG tablet Commonly known as: FLEXERIL Take 5-10 mg by mouth at bedtime as needed for muscle spasms.   ezetimibe 10 MG tablet Commonly known as: ZETIA Take 10 mg by mouth at bedtime.   feeding supplement (ENSURE ENLIVE) Liqd Take 237 mLs by mouth 2 (two) times daily between meals.   Fluticasone-Umeclidin-Vilant 100-62.5-25 MCG/INH Aepb Inhale 1 puff into the lungs daily.   hydrALAZINE 50 MG tablet Commonly known as: APRESOLINE Take 1 tablet (  50 mg total) by mouth every 8 (eight) hours.   isosorbide mononitrate 30 MG 24 hr tablet Commonly known as: IMDUR Take 30 mg by mouth daily.   levothyroxine 150 MCG tablet Commonly known as: SYNTHROID Take 1 tablet (150 mcg total) by mouth daily before breakfast.   Melatonin 5 MG Tabs Take 5 mg by mouth at bedtime.   metoCLOPramide 5 MG tablet Commonly known as: Reglan Take 1 tablet (5 mg total) by mouth every 8 (eight) hours as needed for nausea or refractory nausea / vomiting.    metoprolol tartrate 25 MG tablet Commonly known as: LOPRESSOR Take 1 tablet (25 mg total) by mouth 2 (two) times daily.   oxyCODONE-acetaminophen 7.5-325 MG tablet Commonly known as: PERCOCET Take 1 tablet by mouth every 6 (six) hours as needed for moderate pain or severe pain.   pantoprazole 40 MG tablet Commonly known as: PROTONIX Take 40 mg by mouth 2 (two) times daily.   polyethylene glycol 17 g packet Commonly known as: MIRALAX / GLYCOLAX Take 17 g by mouth daily as needed.   senna-docusate 8.6-50 MG tablet Commonly known as: Senokot-S Take 1 tablet by mouth 2 (two) times daily.   sertraline 25 MG tablet Commonly known as: ZOLOFT Take 1 tablet (25 mg total) by mouth daily.   traZODone 50 MG tablet Commonly known as: DESYREL Take 0.5 tablets (25 mg total) by mouth at bedtime as needed for sleep.   triamcinolone cream 0.5 % Commonly known as: KENALOG Apply 1 application topically 3 (three) times daily.   umeclidinium bromide 62.5 MCG/INH Aepb Commonly known as: INCRUSE ELLIPTA Inhale 1 puff into the lungs daily.   Vitamin D3 25 MCG (1000 UT) Caps Take 1,000 Units by mouth daily.    Discharge Medications: Please see discharge summary for a list of discharge medications.  Relevant Imaging Results:  Relevant Lab Results:   Additional Information [id number]  Beverly Sessions, RN

## 2019-05-26 NOTE — Discharge Summary (Addendum)
[facility] NAME: Veronica Hubbard    MR#:  [id number]  DATE OF BIRTH:  12/07/1934  DATE OF ADMISSION:  05/20/2019   ADMITTING PHYSICIAN: Max Sane, MD  DATE OF DISCHARGE: 05/26/19  PRIMARY CARE PHYSICIAN: Zhou-Talbert, Elwyn Lade, MD   ADMISSION DIAGNOSIS:   Back pain [M54.9]  DISCHARGE DIAGNOSIS:   Active Problems:   Severe back pain   MDS (myelodysplastic syndrome) (HCC)   History of compression fracture of spine   Palliative care by specialist   SECONDARY DIAGNOSIS:   Past Medical History:  Diagnosis Date  . Asthma   . Cancer ([place]) 02/2018   bone marrow cancer  . Depression   . GERD (gastroesophageal reflux disease)   . Hypertension   . Hypothyroidism   . Personal history of chemotherapy   . Spinal stenosis     HOSPITAL COURSE:   Patient is an 84 year old female witha known history of hypertension,hypothyroidism,chronic leukocytosis secondary tohistory of MDS/MPN and was on Jakafi in the pastand recent discharge from the hospital following kyphoplasty for L4 compression fracturebegin readmitted forworsening back pain.   1. Back pain - s/p L1-L2 kyphoplasty done 05/13/19. - recent L3 kyphoplasty on 04/10/2019 And L3 and L4 kyphoplasty on 04/22/2019, both by Dr. Rudene Christians - CT lumbar spine and abd-pelvis not showing any acute pathology - MRI of the lumbar spine showing any other acute pathology either - Continue Flexeril - PT followed during this hospitalization. Discharge to [facility] - Per orthopedic Dr. Rudene Christians, recommends anesthesia evaluation for possible epidural injection. Will continue to follow with them on outpatient basis. - Follow up with Dr. Quita Skye for epidural injection on outpatient basis as scheduled.  2. Acute onset abdominal pain - patient has hx of gastroenteritis due to norovirus, denies diarrhea or vomiting, WBC elevated this am however she is also has hx of chronic leukocytosis due  to MDS/MPN - Symptoms now improved. Patient denies any further episodes - CT abdomen and pelvis obtained and shows no acute abnormality - Blood cultures pending - UA negative - Symptoms of nausea better with Reglan will give Rx at discharge   3. Acute on Chronic Leukocytosis- Has a history of MDS/MPN and was on Jakafi in the past. - Follows with [facility] as an outpatient. Underwent bone marrow biopsy in July 2019.  - Work-up per heme-onc - Needs to follow-up with hematologist at [facility] as an outpatient  4.Hypertension- stable - continue metoprolol, Hydralazine and Imdur  5.Hypothyroidism. - Continue Synthroid  6.History of depression-Stable. -Continue Celexa.  7.History of asthma- Stable.  DISCHARGE CONDITIONS:   stable CONSULTS OBTAINED:   Treatment Team:  Hessie Knows, MD  DRUG ALLERGIES:   Allergies  Allergen Reactions  . Aspirin Shortness Of Breath  . Celecoxib Shortness Of Breath  . Iodinated Diagnostic Agents Other (See Comments)    Reaction:  Decreased kidney function  . Losartan Itching  . Morphine Nausea And Vomiting  . Verapamil Itching   DISCHARGE MEDICATIONS:   Allergies as of 05/26/2019      Reactions   Aspirin Shortness Of Breath   Celecoxib Shortness Of Breath   Iodinated Diagnostic Agents Other (See Comments)   Reaction:  Decreased kidney function   Losartan Itching   Morphine Nausea And Vomiting   Verapamil Itching      Medication List    STOP taking these medications   budesonide 0.5 MG/2ML nebulizer solution Commonly known as: PULMICORT   enoxaparin 30 MG/0.3ML injection Commonly known  as: LOVENOX   ipratropium-albuterol 0.5-2.5 (3) MG/3ML Soln Commonly known as: DUONEB     TAKE these medications   acetaminophen 325 MG tablet Commonly known as: TYLENOL Take 2 tablets (650 mg total) by mouth every 6 (six) hours as needed for mild pain (or Fever >/= 101).   allopurinol 100 MG tablet Commonly known as: ZYLOPRIM Take 200  mg by mouth daily.   citalopram 20 MG tablet Commonly known as: CELEXA Take 20 mg by mouth daily.   cyclobenzaprine 5 MG tablet Commonly known as: FLEXERIL Take 5-10 mg by mouth at bedtime as needed for muscle spasms.   ezetimibe 10 MG tablet Commonly known as: ZETIA Take 10 mg by mouth at bedtime.   feeding supplement (ENSURE ENLIVE) Liqd Take 237 mLs by mouth 2 (two) times daily between meals.   Fluticasone-Umeclidin-Vilant 100-62.5-25 MCG/INH Aepb Inhale 1 puff into the lungs daily.   hydrALAZINE 50 MG tablet Commonly known as: APRESOLINE Take 1 tablet (50 mg total) by mouth every 8 (eight) hours.   isosorbide mononitrate 30 MG 24 hr tablet Commonly known as: IMDUR Take 30 mg by mouth daily.   levothyroxine 150 MCG tablet Commonly known as: SYNTHROID Take 1 tablet (150 mcg total) by mouth daily before breakfast.   Melatonin 5 MG Tabs Take 5 mg by mouth at bedtime.   metoCLOPramide 5 MG tablet Commonly known as: Reglan Take 1 tablet (5 mg total) by mouth every 8 (eight) hours as needed for nausea or refractory nausea / vomiting.   metoprolol tartrate 25 MG tablet Commonly known as: LOPRESSOR Take 1 tablet (25 mg total) by mouth 2 (two) times daily.   oxyCODONE-acetaminophen 7.5-325 MG tablet Commonly known as: PERCOCET Take 1 tablet by mouth every 6 (six) hours as needed for moderate pain or severe pain.   pantoprazole 40 MG tablet Commonly known as: PROTONIX Take 40 mg by mouth 2 (two) times daily.   polyethylene glycol 17 g packet Commonly known as: MIRALAX / GLYCOLAX Take 17 g by mouth daily as needed.   senna-docusate 8.6-50 MG tablet Commonly known as: Senokot-S Take 1 tablet by mouth 2 (two) times daily.   sertraline 25 MG tablet Commonly known as: ZOLOFT Take 1 tablet (25 mg total) by mouth daily.   traZODone 50 MG tablet Commonly known as: DESYREL Take 0.5 tablets (25 mg total) by mouth at bedtime as needed for sleep.   triamcinolone cream  0.5 % Commonly known as: KENALOG Apply 1 application topically 3 (three) times daily.   umeclidinium bromide 62.5 MCG/INH Aepb Commonly known as: INCRUSE ELLIPTA Inhale 1 puff into the lungs daily.   Vitamin D3 25 MCG (1000 UT) Caps Take 1,000 Units by mouth daily.      DISCHARGE INSTRUCTIONS:   DIET:   Low fat, Low cholesterol diet  ACTIVITY:   Activity as tolerated  OXYGEN:   Home Oxygen: No.  Oxygen Delivery: room air  DISCHARGE LOCATION:   nursing home   If you experience worsening of your admission symptoms, develop shortness of breath, life threatening emergency, suicidal or homicidal thoughts you must seek medical attention immediately by calling 911 or calling your MD immediately  if symptoms less severe.  You Must read complete instructions/literature along with all the possible adverse reactions/side effects for all the Medicines you take and that have been prescribed to you. Take any new Medicines after you have completely understood and accpet all the possible adverse reactions/side effects.   Please note  You were cared  for by a hospitalist during your hospital stay. If you have any questions about your discharge medications or the care you received while you were in the hospital after you are discharged, you can call the unit and asked to speak with the hospitalist on call if the hospitalist that took care of you is not available. Once you are discharged, your primary care physician will handle any further medical issues. Please note that NO REFILLS for any discharge medications will be authorized once you are discharged, as it is imperative that you return to your primary care physician (or establish a relationship with a primary care physician if you do not have one) for your aftercare needs so that they can reassess your need for medications and monitor your lab values.  On the day of Discharge:  VITAL SIGNS:   Blood pressure (!) 155/60, pulse (!) 59,  temperature 98.6 F (37 C), resp. rate 19, height 5' 3"  (1.6 m), weight 57.7 kg, SpO2 96 %.  PHYSICAL EXAMINATION:   GENERAL:  84 y.o.-year-old patient lying in the bed with no acute distress.  EYES: Pupils equal, round, reactive to light and accommodation. No scleral icterus. Extraocular muscles intact.  HEENT: Head atraumatic, normocephalic. Oropharynx and nasopharynx clear.  NECK:  Supple, no jugular venous distention. No thyroid enlargement, no tenderness.  LUNGS: Normal breath sounds bilaterally, no wheezing, rales,rhonchi or crepitation. No use of accessory muscles of respiration.  CARDIOVASCULAR: S1, S2 normal. No murmurs, rubs, or gallops.  ABDOMEN: Soft, non-tender, non-distended. Bowel sounds present. No organomegaly or mass.  EXTREMITIES: No pedal edema, cyanosis, or clubbing.  NEUROLOGIC: Cranial nerves II through XII are intact. Muscle strength 5/5 in all extremities. Sensation intact. Gait not checked.  PSYCHIATRIC: The patient is alert and oriented x 3.  SKIN: No obvious rash, lesion, or ulcer.   DATA REVIEW:   CBC Recent Labs  Lab 05/25/19 0755  WBC 25.4*  HGB 13.2  HCT 42.1  PLT 485*    Chemistries  Recent Labs  Lab 05/25/19 0755  NA 135  K 3.8  CL 102  CO2 24  GLUCOSE 115*  BUN 21  CREATININE 1.37*  CALCIUM 11.1*  AST 20  ALT 8  ALKPHOS 81  BILITOT 1.2     Microbiology Results  Results for orders placed or performed during the hospital encounter of 05/20/19  SARS Coronavirus 2 (CEPHEID - Performed in [place] hospital lab), Hosp Order     Status: None   Collection Time: 05/20/19  3:31 PM   Specimen: Nasopharyngeal Swab  Result Value Ref Range Status   SARS Coronavirus 2 NEGATIVE NEGATIVE Final    Comment: (NOTE) If result is NEGATIVE SARS-CoV-2 target nucleic acids are NOT DETECTED. The SARS-CoV-2 RNA is generally detectable in upper and lower  respiratory specimens during the acute phase of infection. The lowest  concentration of  SARS-CoV-2 viral copies this assay can detect is 250  copies / mL. A negative result does not preclude SARS-CoV-2 infection  and should not be used as the sole basis for treatment or other  patient management decisions.  A negative result may occur with  improper specimen collection / handling, submission of specimen other  than nasopharyngeal swab, presence of viral mutation(s) within the  areas targeted by this assay, and inadequate number of viral copies  (<250 copies / mL). A negative result must be combined with clinical  observations, patient history, and epidemiological information. If result is POSITIVE SARS-CoV-2 target nucleic acids are DETECTED. The  SARS-CoV-2 RNA is generally detectable in upper and lower  respiratory specimens dur ing the acute phase of infection.  Positive  results are indicative of active infection with SARS-CoV-2.  Clinical  correlation with patient history and other diagnostic information is  necessary to determine patient infection status.  Positive results do  not rule out bacterial infection or co-infection with other viruses. If result is PRESUMPTIVE POSTIVE SARS-CoV-2 nucleic acids MAY BE PRESENT.   A presumptive positive result was obtained on the submitted specimen  and confirmed on repeat testing.  While 2019 novel coronavirus  (SARS-CoV-2) nucleic acids may be present in the submitted sample  additional confirmatory testing may be necessary for epidemiological  and / or clinical management purposes  to differentiate between  SARS-CoV-2 and other Sarbecovirus currently known to infect humans.  If clinically indicated additional testing with an alternate test  methodology [phone]) is advised. The SARS-CoV-2 RNA is generally  detectable in upper and lower respiratory sp ecimens during the acute  phase of infection. The expected result is Negative. Fact Sheet for Patients:  StrictlyIdeas.no Fact Sheet for Healthcare  Providers: BankingDealers.co.za This test is not yet approved or cleared by the [place] FDA and has been authorized for detection and/or diagnosis of SARS-CoV-2 by FDA under an Emergency Use Authorization (EUA).  This EUA will remain in effect (meaning this test can be used) for the duration of the COVID-19 declaration under Section 564(b)(1) of the Act, 21 U.S.C. section 360bbb-3(b)(1), unless the authorization is terminated or revoked sooner. Performed at [facility], [address] [place], [place] [address]   CULTURE, BLOOD (ROUTINE X 2) w Reflex to ID Panel     Status: None (Preliminary result)   Collection Time: 05/25/19  8:46 AM   Specimen: BLOOD  Result Value Ref Range Status   Specimen Description BLOOD LEFT HAND  Final   Special Requests   Final    BOTTLES DRAWN AEROBIC AND ANAEROBIC Blood Culture adequate volume   Culture   Final    NO GROWTH < 24 HOURS Performed at [facility], [address] [place], [place] [address]    Report Status PENDING  Incomplete  CULTURE, BLOOD (ROUTINE X 2) w Reflex to ID Panel     Status: None (Preliminary result)   Collection Time: 05/25/19  8:46 AM   Specimen: BLOOD  Result Value Ref Range Status   Specimen Description BLOOD LAC  Final   Special Requests   Final    BOTTLES DRAWN AEROBIC AND ANAEROBIC Blood Culture adequate volume   Culture   Final    NO GROWTH < 24 HOURS Performed at [facility], [address], [place],  [address]    Report Status PENDING  Incomplete  SARS Coronavirus 2 (CEPHEID - Performed in [place] hospital lab), Hosp Order     Status: None   Collection Time: 05/25/19  9:01 AM   Specimen: Nasopharyngeal Swab  Result Value Ref Range Status   SARS Coronavirus 2 NEGATIVE NEGATIVE Final    Comment: (NOTE) If result is NEGATIVE SARS-CoV-2 target nucleic acids are NOT DETECTED. The SARS-CoV-2 RNA is generally detectable in upper and lower   respiratory specimens during the acute phase of infection. The lowest  concentration of SARS-CoV-2 viral copies this assay can detect is 250  copies / mL. A negative result does not preclude SARS-CoV-2 infection  and should not be used as the sole basis for treatment or other  patient management decisions.  A negative result may occur with  improper specimen collection / handling, submission of specimen other  than nasopharyngeal swab, presence of viral mutation(s) within the  areas targeted by this assay, and inadequate number of viral copies  (<250 copies / mL). A negative result must be combined with clinical  observations, patient history, and epidemiological information. If result is POSITIVE SARS-CoV-2 target nucleic acids are DETECTED. The SARS-CoV-2 RNA is generally detectable in upper and lower  respiratory specimens dur ing the acute phase of infection.  Positive  results are indicative of active infection with SARS-CoV-2.  Clinical  correlation with patient history and other diagnostic information is  necessary to determine patient infection status.  Positive results do  not rule out bacterial infection or co-infection with other viruses. If result is PRESUMPTIVE POSTIVE SARS-CoV-2 nucleic acids MAY BE PRESENT.   A presumptive positive result was obtained on the submitted specimen  and confirmed on repeat testing.  While 2019 novel coronavirus  (SARS-CoV-2) nucleic acids may be present in the submitted sample  additional confirmatory testing may be necessary for epidemiological  and / or clinical management purposes  to differentiate between  SARS-CoV-2 and other Sarbecovirus currently known to infect humans.  If clinically indicated additional testing with an alternate test  methodology [phone]) is advised. The SARS-CoV-2 RNA is generally  detectable in upper and lower respiratory sp ecimens during the acute  phase of infection. The expected result is Negative. Fact  Sheet for Patients:  StrictlyIdeas.no Fact Sheet for Healthcare Providers: BankingDealers.co.za This test is not yet approved or cleared by the [place] FDA and has been authorized for detection and/or diagnosis of SARS-CoV-2 by FDA under an Emergency Use Authorization (EUA).  This EUA will remain in effect (meaning this test can be used) for the duration of the COVID-19 declaration under Section 564(b)(1) of the Act, 21 U.S.C. section 360bbb-3(b)(1), unless the authorization is terminated or revoked sooner. Performed at [facility], [address], [place], [place] [address]     RADIOLOGY:  No results found.   Management plans discussed with the patient, family and they are in agreement.  CODE STATUS:     Code Status Orders  (From admission, onward)         Start     Ordered   05/20/19 1935  Full code  Continuous     05/20/19 1934        Code Status History    Date Active Date Inactive Code Status Order ID Comments User Context   05/11/2019 1219 05/19/2019 1742 Full Code [id number]  Otila Back, MD ED   04/19/2019 0539 04/27/2019 1712 Full Code [id number]  Harrie Foreman, MD Inpatient   02/24/2016 0024 02/24/2016 2023 Full Code [id number]  Henreitta Leber, MD Inpatient   02/09/2016 1042 02/10/2016 2023 Full Code [id number]  Loletha Grayer, MD ED   Advance Care Planning Activity    Advance Directive Documentation     Most Recent Value  Type of Advance Directive  Healthcare Power of Attorney  Pre-existing out of facility DNR order (yellow form or pink MOST form)  -  "MOST" Form in Place?  -      TOTAL TIME TAKING CARE OF THIS PATIENT: 38 minutes.   Rufina Falco, DNP, FNP-BC [facility] Nurse Practitioner Between 7am to 6pm - Pager - [phone]  After 6pm go to [email or web address] - password [email or web address] [facility]  Office  [phone]  CC: Primary care physician; Curtis Sites  S, MD  Note: This dictation was prepared with Dragon dictation along with smaller phrase technology. Any transcriptional errors that result from this process are unintentional.

## 2019-05-26 NOTE — Progress Notes (Signed)
Physical Therapy Treatment Patient Details Name: Veronica Hubbard MRN: [id number] DOB: [birth year] Today's Date: 05/26/2019    History of Present Illness Pt admitted for severe back pain. History includes recent compression fracture with kyphos on 6/4 (L3), 6/16 (L3/L4), 7/7 (L1-L2). Since discharge, pt reports increased pain with all mobility and has been utilized the [facility] for primary mobility. Pt with ortho reporting possible steriod injection for pain relief. Of note, pt reports she didn't bring her back brace from home.    PT Comments    Pt is making limited progress towards goals secondary to increase in pain with any movement outside of bed. Pain appears to be mainly in back, however does report some in the hip. Good endurance for there-ex. She states she is not able to ambulate this date due to pain.  Will continue to progress as able.  Follow Up Recommendations  SNF     Equipment Recommendations  Rolling walker with 5" wheels;Other (comment)    Recommendations for Other Services       Precautions / Restrictions Precautions Precautions: Fall;Back Precaution Booklet Issued: No Restrictions Weight Bearing Restrictions: Yes RLE Weight Bearing: Weight bearing as tolerated LLE Weight Bearing: Weight bearing as tolerated    Mobility  Bed Mobility Overal bed mobility: Needs Assistance Bed Mobility: Supine to Sit Rolling: Min guard         General bed mobility comments: needs assist for B LEs. Once seated at EOB, able to perform scooting up towards [place]. Pt then transitioned back to supine. Able to perform bridging for chuck pad placement.  Transfers                 General transfer comment: unable secondary to pain  Ambulation/Gait                 Stairs             Wheelchair Mobility    Modified Rankin (Stroke Patients Only)       Balance Overall balance assessment: Needs assistance Sitting-balance support: Feet supported;Single  extremity supported Sitting balance-Leahy Scale: Good                                      Cognition Arousal/Alertness: Awake/alert Behavior During Therapy: WFL for tasks assessed/performed;Impulsive Overall Cognitive Status: Within Functional Limits for tasks assessed                                        Exercises Other Exercises Other Exercises: Pt able to perform ther-ex in supine/seated position. including ankle circles, SLRs, hip abd/add, SAQ, LAQ, crunches, and standing mini squats (5 reps due to pain). 15 reps performed x supervision    General Comments        Pertinent Vitals/Pain Pain Assessment: 0-10 Pain Score: 6  Pain Location: L hip and back Pain Descriptors / Indicators: Constant;Sore;Grimacing Pain Intervention(s): Limited activity within patient's tolerance    Home Living                      Prior Function            PT Goals (current goals can now be found in the care plan section) Acute Rehab PT Goals Patient Stated Goal: to make pain better PT Goal Formulation: With patient Time For Goal  Achievement: 06/04/19 Potential to Achieve Goals: Fair Progress towards PT goals: Progressing toward goals    Frequency    Min 2X/week      PT Plan Current plan remains appropriate    Co-evaluation              AM-PAC PT "6 Clicks" Mobility   Outcome Measure  Help needed turning from your back to your side while in a flat bed without using bedrails?: A Little Help needed moving from lying on your back to sitting on the side of a flat bed without using bedrails?: A Little Help needed moving to and from a bed to a chair (including a wheelchair)?: A Lot Help needed standing up from a chair using your arms (e.g., wheelchair or bedside chair)?: A Lot Help needed to walk in hospital room?: A Lot Help needed climbing 3-5 steps with a railing? : A Lot 6 Click Score: 14    End of Session   Activity Tolerance:  Patient limited by pain Patient left: in bed;with bed alarm set Nurse Communication: Mobility status PT Visit Diagnosis: Muscle weakness (generalized) (M62.81);Difficulty in walking, not elsewhere classified (R26.2);Pain Pain - Right/Left: Left Pain - part of body: Hip     Time: 7591-6384 PT Time Calculation (min) (ACUTE ONLY): 24 min  Charges:  $Gait Training: 8-22 mins $Therapeutic Exercise: 8-22 mins                     Greggory Stallion, PT, DPT [phone]    Stelios Kirby 05/26/2019, 12:59 PM

## 2019-05-26 NOTE — Care Management Important Message (Signed)
Important Message  Patient Details  Name: Veronica Hubbard MRN: [id number] Date of Birth: 19-Jul-1935   Medicare Important Message Given:  Yes     Beverly Sessions, RN 05/26/2019, 3:35 PM

## 2019-05-26 NOTE — TOC Transition Note (Signed)
Transition of Care [facility]) - CM/SW Discharge Note   Patient Details  Name: Veronica Hubbard MRN: [id number] Date of Birth: 1935/01/31  Transition of Care [facility]) CM/SW Contact:  Beverly Sessions, RN Phone Number: 05/26/2019, 3:31 PM   Clinical Narrative:    Insurance auth received  Patient to discharge to [facility] today Repeat covid negative.  Sister notified of discharge [place] notified of disposition    EMS packet given to bedside RN  Final next level of care: Skilled Nursing Facility Barriers to Discharge: Barriers Resolved   Patient Goals and CMS Choice Patient states their goals for this hospitalization and ongoing recovery are:: "I"m planning on going home" CMS Medicare.gov Compare Post Acute Care list provided to:: Patient Choice offered to / list presented to : Patient  Discharge Placement              Patient [facility] bed at: [facility] Patient to be transferred to facility by: EMS Name of family member notified: [name] Patient and family notified of of transfer: 05/26/19  Discharge Plan and Services     Post Acute Care Choice: [place]: PT [place]: [place] Date [place]: 05/20/19 Time HH Agency Contacted: 1430 Representative spoke with at [place]: [place] (SDOH) Interventions     Readmission Risk Interventions No flowsheet data found.

## 2019-05-27 LAB — URINE CULTURE: Culture: 60000 — AB

## 2019-05-29 ENCOUNTER — Ambulatory Visit: Payer: Medicare HMO | Attending: Anesthesiology | Admitting: Anesthesiology

## 2019-05-29 ENCOUNTER — Other Ambulatory Visit: Payer: Self-pay

## 2019-05-30 LAB — CULTURE, BLOOD (ROUTINE X 2)
Culture: NO GROWTH
Culture: NO GROWTH
Special Requests: ADEQUATE
Special Requests: ADEQUATE

## 2019-06-16 ENCOUNTER — Ambulatory Visit: Payer: Self-pay | Admitting: Urology

## 2019-06-16 ENCOUNTER — Telehealth: Payer: Self-pay | Admitting: Student

## 2019-06-16 NOTE — Telephone Encounter (Signed)
Palliative NP left message for facility representative Larene Beach that arranges zoom meetings; awaiting return call to schedule visit.

## 2019-06-18 ENCOUNTER — Encounter
Admission: RE | Admit: 2019-06-18 | Discharge: 2019-06-18 | Disposition: A | Discharge status: Home / Self Care | Payer: Medicare HMO | Source: Ambulatory Visit | Attending: Orthopedic Surgery | Admitting: Orthopedic Surgery

## 2019-06-18 ENCOUNTER — Other Ambulatory Visit: Payer: Self-pay

## 2019-06-18 DIAGNOSIS — Z79890 Hormone replacement therapy (postmenopausal): Secondary | ICD-10-CM | POA: Diagnosis not present

## 2019-06-18 DIAGNOSIS — E119 Type 2 diabetes mellitus without complications: Secondary | ICD-10-CM | POA: Diagnosis not present

## 2019-06-18 DIAGNOSIS — Z7984 Long term (current) use of oral hypoglycemic drugs: Secondary | ICD-10-CM | POA: Diagnosis not present

## 2019-06-18 DIAGNOSIS — I10 Essential (primary) hypertension: Secondary | ICD-10-CM | POA: Diagnosis not present

## 2019-06-18 DIAGNOSIS — E785 Hyperlipidemia, unspecified: Secondary | ICD-10-CM | POA: Diagnosis not present

## 2019-06-18 DIAGNOSIS — J449 Chronic obstructive pulmonary disease, unspecified: Secondary | ICD-10-CM | POA: Diagnosis not present

## 2019-06-18 DIAGNOSIS — Z87891 Personal history of nicotine dependence: Secondary | ICD-10-CM | POA: Diagnosis not present

## 2019-06-18 DIAGNOSIS — E039 Hypothyroidism, unspecified: Secondary | ICD-10-CM | POA: Diagnosis not present

## 2019-06-18 DIAGNOSIS — K219 Gastro-esophageal reflux disease without esophagitis: Secondary | ICD-10-CM | POA: Diagnosis not present

## 2019-06-18 DIAGNOSIS — Z79899 Other long term (current) drug therapy: Secondary | ICD-10-CM | POA: Diagnosis not present

## 2019-06-18 DIAGNOSIS — Z20828 Contact with and (suspected) exposure to other viral communicable diseases: Secondary | ICD-10-CM | POA: Diagnosis not present

## 2019-06-18 DIAGNOSIS — I2510 Atherosclerotic heart disease of native coronary artery without angina pectoris: Secondary | ICD-10-CM | POA: Diagnosis not present

## 2019-06-18 DIAGNOSIS — M4856XA Collapsed vertebra, not elsewhere classified, lumbar region, initial encounter for fracture: Secondary | ICD-10-CM | POA: Diagnosis present

## 2019-06-18 HISTORY — DX: Chronic kidney disease, unspecified: N18.9

## 2019-06-18 HISTORY — DX: Chronic obstructive pulmonary disease, unspecified: J44.9

## 2019-06-18 HISTORY — DX: Anemia, unspecified: D64.9

## 2019-06-18 HISTORY — DX: Congenital malformation of aorta unspecified: Q25.40

## 2019-06-18 HISTORY — DX: Anxiety disorder, unspecified: F41.9

## 2019-06-18 HISTORY — DX: Atherosclerotic heart disease of native coronary artery without angina pectoris: I25.10

## 2019-06-18 LAB — SARS CORONAVIRUS 2 (TAT 6-24 HRS): SARS Coronavirus 2: NEGATIVE

## 2019-06-18 NOTE — Patient Instructions (Signed)
Your procedure is scheduled on: 06/19/19 Report to Day Surgery. [facility] second floor at 2:00 pm .  Remember: Instructions that are not followed completely may result in serious medical risk,  up to and including death, or upon the discretion of your surgeon and anesthesiologist your  surgery may need to be rescheduled.     _X__ 1. Do not eat food after midnight the night before your procedure.                 No gum chewing or hard candies. You may drink clear liquids up to 2 hours                 before you are scheduled to arrive for your surgery- DO not drink clear                 liquids within 2 hours of the start of your surgery.                 Clear Liquids include:  water, apple juice without pulp, clear carbohydrate                 drink such as Clearfast of Gatorade, Black Coffee or Tea (Do not add                 anything to coffee or tea).  __X__2.  On the morning of surgery brush your teeth with toothpaste and water, you                may rinse your mouth with mouthwash if you wish.  Do not swallow any toothpaste of mouthwash.     _X__ 3.  No Alcohol for 24 hours before or after surgery.   _X__ 4.  Do Not Smoke or use e-cigarettes For 24 Hours Prior to Your Surgery.                 Do not use any chewable tobacco products for at least 6 hours prior to                 surgery.  ____  5.  Bring all medications with you on the day of surgery if instructed.   _x___  6.  Notify your doctor if there is any change in your medical condition      (cold, fever, infections).     Do not wear jewelry, make-up, hairpins, clips or nail polish. Do not wear lotions, powders, or perfumes. You may wear deodorant. Do not shave 48 hours prior to surgery. Men may shave face and neck. Do not bring valuables to the hospital.    [facility] is not responsible for any belongings or valuables.  Contacts, dentures or bridgework may not be worn into surgery. Leave  your suitcase in the car. After surgery it may be brought to your room. For patients admitted to the hospital, discharge time is determined by your treatment team.   Patients discharged the day of surgery will not be allowed to drive home.   Please read over the following fact sheets that you were given:   Surgical Site Infection Prevention          _x___ Take these medicines the morning of surgery with A SIP OF WATER:    1. allopuinol  2. citalopram  3. hydralazine  4.isosobide  5.metoclopramide at bedtime 06/18/19 and am 06/19/19  6.metoprolol             7.protonix  8.synthroid             9. Flexeril   ____ Fleet Enema (as directed)   __x__ Use CHG Soap as directed  _x___ Use inhalers on the day of surgery  DO NEBULIZER TREATMENT IN AM AND BRING ALL INHALERS  ____ Stop metformin 2 days prior to surgery    ____ Take 1/2 of usual insulin dose the night before surgery. No insulin the morning          of surgery.   ____ Stop Coumadin/Plavix/aspirin on   ____ Stop Anti-inflammatories on    __X__ Stop supplements until after surgery.  NO FISH OIL 06/18/19  ____ Bring C-Pap to the hospital.

## 2019-06-19 ENCOUNTER — Ambulatory Visit
Admission: RE | Admit: 2019-06-19 | Discharge: 2019-06-19 | Disposition: A | Discharge status: Home / Self Care | Payer: Medicare HMO | Attending: Orthopedic Surgery | Admitting: Orthopedic Surgery

## 2019-06-19 ENCOUNTER — Encounter
Admission: RE | Disposition: A | Discharge status: Home / Self Care | Payer: Self-pay | Source: Home / Self Care | Attending: Orthopedic Surgery

## 2019-06-19 ENCOUNTER — Ambulatory Visit: Payer: Medicare HMO | Admitting: Certified Registered Nurse Anesthetist

## 2019-06-19 ENCOUNTER — Encounter: Payer: Self-pay | Admitting: *Deleted

## 2019-06-19 ENCOUNTER — Ambulatory Visit: Payer: Medicare HMO

## 2019-06-19 DIAGNOSIS — I10 Essential (primary) hypertension: Secondary | ICD-10-CM | POA: Insufficient documentation

## 2019-06-19 DIAGNOSIS — E785 Hyperlipidemia, unspecified: Secondary | ICD-10-CM | POA: Insufficient documentation

## 2019-06-19 DIAGNOSIS — J449 Chronic obstructive pulmonary disease, unspecified: Secondary | ICD-10-CM | POA: Insufficient documentation

## 2019-06-19 DIAGNOSIS — Z7984 Long term (current) use of oral hypoglycemic drugs: Secondary | ICD-10-CM | POA: Insufficient documentation

## 2019-06-19 DIAGNOSIS — M4856XA Collapsed vertebra, not elsewhere classified, lumbar region, initial encounter for fracture: Secondary | ICD-10-CM | POA: Insufficient documentation

## 2019-06-19 DIAGNOSIS — E119 Type 2 diabetes mellitus without complications: Secondary | ICD-10-CM | POA: Insufficient documentation

## 2019-06-19 DIAGNOSIS — E039 Hypothyroidism, unspecified: Secondary | ICD-10-CM | POA: Insufficient documentation

## 2019-06-19 DIAGNOSIS — Z20828 Contact with and (suspected) exposure to other viral communicable diseases: Secondary | ICD-10-CM | POA: Insufficient documentation

## 2019-06-19 DIAGNOSIS — I2510 Atherosclerotic heart disease of native coronary artery without angina pectoris: Secondary | ICD-10-CM | POA: Insufficient documentation

## 2019-06-19 DIAGNOSIS — Z87891 Personal history of nicotine dependence: Secondary | ICD-10-CM | POA: Insufficient documentation

## 2019-06-19 DIAGNOSIS — Z419 Encounter for procedure for purposes other than remedying health state, unspecified: Secondary | ICD-10-CM

## 2019-06-19 DIAGNOSIS — Z79899 Other long term (current) drug therapy: Secondary | ICD-10-CM | POA: Insufficient documentation

## 2019-06-19 DIAGNOSIS — Z79890 Hormone replacement therapy (postmenopausal): Secondary | ICD-10-CM | POA: Insufficient documentation

## 2019-06-19 DIAGNOSIS — K219 Gastro-esophageal reflux disease without esophagitis: Secondary | ICD-10-CM | POA: Insufficient documentation

## 2019-06-19 HISTORY — PX: KYPHOPLASTY: SHX5884

## 2019-06-19 SURGERY — KYPHOPLASTY
Anesthesia: Monitor Anesthesia Care | Site: Back

## 2019-06-19 MED ORDER — DEXAMETHASONE SODIUM PHOSPHATE 10 MG/ML IJ SOLN
INTRAMUSCULAR | Status: DC | PRN
  Administered 2019-06-19: 5 mg via INTRAVENOUS

## 2019-06-19 MED ORDER — CEFAZOLIN SODIUM-DEXTROSE 2-4 GM/100ML-% IV SOLN
2.0000 g | Freq: Once | INTRAVENOUS | Status: AC
  Administered 2019-06-19: 2 g via INTRAVENOUS

## 2019-06-19 MED ORDER — FENTANYL CITRATE (PF) 100 MCG/2ML IJ SOLN
INTRAMUSCULAR | Status: DC | PRN
  Administered 2019-06-19 (×4): 25 ug via INTRAVENOUS

## 2019-06-19 MED ORDER — FENTANYL CITRATE (PF) 100 MCG/2ML IJ SOLN: INTRAMUSCULAR | Status: DC | PRN

## 2019-06-19 MED ORDER — EPHEDRINE SULFATE 50 MG/ML IJ SOLN
INTRAMUSCULAR | Status: DC | PRN
  Administered 2019-06-19: 10 mg via INTRAVENOUS

## 2019-06-19 MED ORDER — LIDOCAINE HCL (CARDIAC) PF 100 MG/5ML IV SOSY
PREFILLED_SYRINGE | INTRAVENOUS | Status: DC | PRN
  Administered 2019-06-19: 50 mg via INTRATRACHEAL

## 2019-06-19 MED ORDER — LIDOCAINE HCL 1 % IJ SOLN
INTRAMUSCULAR | Status: DC | PRN
  Administered 2019-06-19: 20 mL

## 2019-06-19 MED ORDER — ONDANSETRON HCL 4 MG/2ML IJ SOLN
INTRAMUSCULAR | Status: DC | PRN
  Administered 2019-06-19: 4 mg via INTRAVENOUS

## 2019-06-19 MED ORDER — LIDOCAINE HCL (PF) 1 % IJ SOLN
INTRAMUSCULAR | Status: AC
  Filled 2019-06-19: qty 60

## 2019-06-19 MED ORDER — PROPOFOL 10 MG/ML IV BOLUS
INTRAVENOUS | Status: DC | PRN
  Administered 2019-06-19: 10 mg via INTRAVENOUS
  Administered 2019-06-19: 30 mg via INTRAVENOUS
  Administered 2019-06-19 (×4): 10 mg via INTRAVENOUS

## 2019-06-19 MED ORDER — FENTANYL CITRATE (PF) 100 MCG/2ML IJ SOLN
INTRAMUSCULAR | Status: AC
  Administered 2019-06-19: 25 ug via INTRAVENOUS
  Filled 2019-06-19: qty 2

## 2019-06-19 MED ORDER — PHENYLEPHRINE HCL (PRESSORS) 10 MG/ML IV SOLN
INTRAVENOUS | Status: DC | PRN
  Administered 2019-06-19 (×2): 100 ug via INTRAVENOUS

## 2019-06-19 MED ORDER — FENTANYL CITRATE (PF) 100 MCG/2ML IJ SOLN
25.0000 ug | INTRAMUSCULAR | Status: DC | PRN
  Administered 2019-06-19 (×4): 25 ug via INTRAVENOUS

## 2019-06-19 MED ORDER — PROPOFOL 500 MG/50ML IV EMUL
INTRAVENOUS | Status: DC | PRN
  Administered 2019-06-19: 50 ug/kg/min via INTRAVENOUS

## 2019-06-19 MED ORDER — FENTANYL CITRATE (PF) 100 MCG/2ML IJ SOLN
INTRAMUSCULAR | Status: AC
  Filled 2019-06-19: qty 2

## 2019-06-19 MED ORDER — ONDANSETRON HCL 4 MG/2ML IJ SOLN: 4.0000 mg | Freq: Once | INTRAMUSCULAR | Status: DC | PRN

## 2019-06-19 MED ORDER — OXYCODONE-ACETAMINOPHEN 7.5-325 MG PO TABS: 1.0000 | ORAL_TABLET | ORAL | 0 refills | Status: DC | PRN

## 2019-06-19 MED ORDER — IOHEXOL 180 MG/ML  SOLN
INTRAMUSCULAR | Status: DC | PRN
  Administered 2019-06-19: 15:00:00 5 mL

## 2019-06-19 MED ORDER — BUPIVACAINE HCL (PF) 0.5 % IJ SOLN
INTRAMUSCULAR | Status: AC
  Filled 2019-06-19: qty 30

## 2019-06-19 MED ORDER — BUPIVACAINE-EPINEPHRINE (PF) 0.5% -1:200000 IJ SOLN
INTRAMUSCULAR | Status: DC | PRN
  Administered 2019-06-19: 20 mL

## 2019-06-19 MED ORDER — SODIUM CHLORIDE 0.9 % IV SOLN
INTRAVENOUS | Status: DC
  Administered 2019-06-19: 13:00:00 via INTRAVENOUS

## 2019-06-19 SURGICAL SUPPLY — 20 items
CEMENT KYPHON CX01A KIT/MIXER (Cement) ×3 IMPLANT
COVER WAND RF STERILE (DRAPES) ×3 IMPLANT
DERMABOND ADVANCED (GAUZE/BANDAGES/DRESSINGS) ×2
DERMABOND ADVANCED .7 DNX12 (GAUZE/BANDAGES/DRESSINGS) ×1 IMPLANT
DEVICE BIOPSY BONE KYPHX (INSTRUMENTS) ×3 IMPLANT
DRAPE C-ARM XRAY 36X54 (DRAPES) ×3 IMPLANT
DURAPREP 26ML APPLICATOR (WOUND CARE) ×3 IMPLANT
FEE RENTAL RFA GENERATOR (MISCELLANEOUS) IMPLANT
GLOVE SURG SYN 9.0  PF PI (GLOVE) ×2
GLOVE SURG SYN 9.0 PF PI (GLOVE) ×1 IMPLANT
GOWN SRG 2XL LVL 4 RGLN SLV (GOWNS) ×1 IMPLANT
GOWN STRL NON-REIN 2XL LVL4 (GOWNS) ×2
GOWN STRL REUS W/ TWL LRG LVL3 (GOWN DISPOSABLE) ×1 IMPLANT
GOWN STRL REUS W/TWL LRG LVL3 (GOWN DISPOSABLE) ×2
PACK KYPHOPLASTY (MISCELLANEOUS) ×3 IMPLANT
RENTAL RFA  GENERATOR (MISCELLANEOUS)
RENTAL RFA GENERATOR (MISCELLANEOUS) IMPLANT
STRAP SAFETY 5IN WIDE (MISCELLANEOUS) ×3 IMPLANT
TRAY KYPHOPAK 15/3 EXPRESS 1ST (MISCELLANEOUS) ×1 IMPLANT
TRAY KYPHOPAK 20/3 EXPRESS 1ST (MISCELLANEOUS) ×3 IMPLANT

## 2019-06-19 NOTE — Transfer of Care (Signed)
Immediate Anesthesia Transfer of Care Note  Patient: Tinlee Navarrette  Procedure(s) Performed: Procedure(s): L5 KYPHOPLASTY (N/A)  Patient Location: PACU  Anesthesia Type:General  Level of Consciousness: sedated  Airway & Oxygen Therapy: Patient Spontanous Breathing and Patient connected to face mask oxygen  Post-op Assessment: Report given to RN and Post -op Vital signs reviewed and stable  Post vital signs: Reviewed and stable  Last Vitals:  Vitals:   06/19/19 1307 06/19/19 1513  BP: 116/72 (!) 107/59  Pulse: 86 66  Resp: 20 12  Temp: (!) 36.1 C (!) (P) 36.1 C  SpO2: 72% 09%    Complications: No apparent anesthesia complications

## 2019-06-19 NOTE — Discharge Instructions (Signed)

## 2019-06-19 NOTE — Anesthesia Preprocedure Evaluation (Signed)
Anesthesia Evaluation  Patient identified by MRN, date of birth, ID band Patient awake    Reviewed: Allergy & Precautions, H&P , NPO status , Patient's Chart, lab work & pertinent test results, reviewed documented beta blocker date and time   Airway Mallampati: III  TM Distance: >3 FB Neck ROM: full    Dental no notable dental hx. (+) Teeth Intact   Pulmonary asthma , COPD, Current Smoker and Patient abstained from smoking.,    Pulmonary exam normal breath sounds clear to auscultation       Cardiovascular Exercise Tolerance: Poor hypertension, On Medications + CAD   Rhythm:regular Rate:Normal     Neuro/Psych PSYCHIATRIC DISORDERS Anxiety Depression negative neurological ROS     GI/Hepatic Neg liver ROS, GERD  Medicated,  Endo/Other  diabetes, Well Controlled, Oral Hypoglycemic AgentsHypothyroidism   Renal/GU Renal disease     Musculoskeletal   Abdominal   Peds  Hematology  (+) Blood dyscrasia, anemia ,   Anesthesia Other Findings   Reproductive/Obstetrics negative OB ROS                             Anesthesia Physical Anesthesia Plan  ASA: II  Anesthesia Plan: MAC   Post-op Pain Management:    Induction:   PONV Risk Score and Plan:   Airway Management Planned:   Additional Equipment:   Intra-op Plan:   Post-operative Plan:   Informed Consent: I have reviewed the patients History and Physical, chart, labs and discussed the procedure including the risks, benefits and alternatives for the proposed anesthesia with the patient or authorized representative who has indicated his/her understanding and acceptance.       Plan Discussed with: CRNA  Anesthesia Plan Comments:         Anesthesia Quick Evaluation

## 2019-06-19 NOTE — Progress Notes (Signed)
Upon arrival noted bandaide  bloody and clotting around incision site   Changed bandaide   Dr Rudene Christians in to see pt

## 2019-06-19 NOTE — Anesthesia Post-op Follow-up Note (Signed)
Anesthesia QCDR form completed.        

## 2019-06-19 NOTE — H&P (Signed)
Reviewed paper H+P, will be scanned into chart. No changes noted. Patient had SI joint injection without relief and comes in for L5 kyphoplasty for new compression fracture and unrelenting pain.

## 2019-06-19 NOTE — Op Note (Signed)
Date June 19, 2019  time 3:11 PM   PATIENT:  Veronica Hubbard   PRE-OPERATIVE DIAGNOSIS:  closed wedge compression fracture of L5   POST-OPERATIVE DIAGNOSIS:  closed wedge compression fracture of L5   PROCEDURE:  Procedure(s): KYPHOPLASTY L5  SURGEON: Laurene Footman, MD   ASSISTANTS: None   ANESTHESIA:   local and MAC   EBL:  No intake/output data recorded.   BLOOD ADMINISTERED:none   DRAINS: none    LOCAL MEDICATIONS USED:  MARCAINE    and XYLOCAINE    SPECIMEN:   L5 vertebral body biopsy   DISPOSITION OF SPECIMEN:  Pathology   COUNTS:  YES   TOURNIQUET:  * No tourniquets in log *   IMPLANTS: Bone cement   DICTATION: .Dragon Dictation  patient was brought to the operating room and after adequate anesthesia was obtained the patient was placed prone.  C arm was brought in in good visualization of the affected level obtained on both AP and lateral projections.  After patient identification and timeout procedures were completed, local anesthetic was infiltrated with 10 cc 1% Xylocaine infiltrated subcutaneously.  This is done the area on the right side of the planned approach.  The back was then prepped and draped in the usual sterile manner and repeat timeout procedure carried out.  A spinal needle was brought down to the pedicle on the right side of  L5 and a 50-50 mix of 1% Xylocaine half percent Sensorcaine with epinephrine total of 20 cc injected.  After allowing this to set a small incision was made and the trocar was advanced into the vertebral body in an extrapedicular fashion.  Biopsy was obtained Drilling was carried out balloon inserted with inflation to  4-1/2 cc.  When the cement was appropriate consistency 6 cc were injected into the vertebral body without extravasation, good fill superior to inferior endplates and from right to left sides along the inferior endplate.  After the cement had set the trochar was removed and permanent C-arm views obtained.  The wound  was closed with Dermabond followed by Band-Aid   PLAN OF CARE: Discharge to home after PACU   PATIENT DISPOSITION:  PACU - hemodynamically stable.

## 2019-06-19 NOTE — Progress Notes (Signed)
Dr Rudene Christians in to see pt   He states that she has a lot of arthritis and that she has been in pain level of 8 out of 10 before coming in and has been on a lot of pain medication

## 2019-06-23 LAB — SURGICAL PATHOLOGY

## 2019-06-25 NOTE — Anesthesia Postprocedure Evaluation (Signed)
Anesthesia Post Note  Patient: Veronica Hubbard  Procedure(s) Performed: L5 KYPHOPLASTY (N/A Back)  Patient location during evaluation: PACU Anesthesia Type: MAC Level of consciousness: awake and alert Pain management: pain level controlled Vital Signs Assessment: post-procedure vital signs reviewed and stable Respiratory status: spontaneous breathing, nonlabored ventilation, respiratory function stable and patient connected to nasal cannula oxygen Cardiovascular status: blood pressure returned to baseline and stable Postop Assessment: no apparent nausea or vomiting Anesthetic complications: no     Last Vitals:  Vitals:   06/19/19 1610 06/19/19 1622  BP: 127/70 112/63  Pulse: 77 78  Resp: 11 14  Temp: 36.6 C   SpO2: 96% 96%    Last Pain:  Vitals:   06/20/19 0840  TempSrc:   PainSc: [name]

## 2019-07-02 ENCOUNTER — Other Ambulatory Visit: Payer: Self-pay | Admitting: Orthopedic Surgery

## 2019-07-02 DIAGNOSIS — S22080A Wedge compression fracture of T11-T12 vertebra, initial encounter for closed fracture: Secondary | ICD-10-CM

## 2019-07-03 ENCOUNTER — Other Ambulatory Visit: Payer: Self-pay

## 2019-07-03 ENCOUNTER — Ambulatory Visit
Admission: RE | Admit: 2019-07-03 | Discharge: 2019-07-03 | Disposition: A | Discharge status: Home / Self Care | Payer: Medicare HMO | Source: Ambulatory Visit | Attending: Orthopedic Surgery | Admitting: Orthopedic Surgery

## 2019-07-03 DIAGNOSIS — S22080A Wedge compression fracture of T11-T12 vertebra, initial encounter for closed fracture: Secondary | ICD-10-CM | POA: Insufficient documentation

## 2019-07-03 NOTE — Anesthesia Preprocedure Evaluation (Addendum)
Anesthesia Evaluation  Patient identified by MRN, date of birth, ID band Patient awake    Reviewed: Allergy & Precautions, H&P , NPO status , Patient's Chart, lab work & pertinent test results, reviewed documented beta blocker date and time   Airway Mallampati: II  TM Distance: >3 FB Neck ROM: full    Dental no notable dental hx. (+) Teeth Intact   Pulmonary neg pulmonary ROS, asthma , COPD, Current Smoker and Patient abstained from smoking.,    Pulmonary exam normal breath sounds clear to auscultation       Cardiovascular Exercise Tolerance: Poor hypertension, + CAD  negative cardio ROS   Rhythm:regular Rate:Normal     Neuro/Psych PSYCHIATRIC DISORDERS Anxiety Depression negative neurological ROS  negative psych ROS   GI/Hepatic negative GI ROS, Neg liver ROS, GERD  ,  Endo/Other  negative endocrine ROSdiabetesHypothyroidism   Renal/GU Renal disease     Musculoskeletal   Abdominal   Peds  Hematology negative hematology ROS (+) Blood dyscrasia, anemia ,   Anesthesia Other Findings Past Medical History: No date: Anemia No date: Anxiety No date: Aortic anomaly     Comment:  stenosis No date: Asthma 02/2018: Cancer ([place])     Comment:  bone marrow cancer No date: Chronic kidney disease No date: COPD (chronic obstructive pulmonary disease) (HCC) No date: Coronary artery disease No date: Depression No date: GERD (gastroesophageal reflux disease) No date: Hypertension No date: Hypothyroidism No date: Personal history of chemotherapy No date: Spinal stenosis   Reproductive/Obstetrics negative OB ROS                            Anesthesia Physical Anesthesia Plan  ASA: II  Anesthesia Plan: MAC   Post-op Pain Management:    Induction:   PONV Risk Score and Plan:   Airway Management Planned:   Additional Equipment:   Intra-op Plan:   Post-operative Plan:   Informed  Consent: I have reviewed the patients History and Physical, chart, labs and discussed the procedure including the risks, benefits and alternatives for the proposed anesthesia with the patient or authorized representative who has indicated his/her understanding and acceptance.       Plan Discussed with: CRNA  Anesthesia Plan Comments:         Anesthesia Quick Evaluation

## 2019-07-04 ENCOUNTER — Encounter
Admission: RE | Disposition: A | Discharge status: Home / Self Care | Payer: Self-pay | Source: Home / Self Care | Attending: Orthopedic Surgery

## 2019-07-04 ENCOUNTER — Ambulatory Visit: Payer: Medicare HMO | Admitting: Anesthesiology

## 2019-07-04 ENCOUNTER — Ambulatory Visit
Admission: RE | Admit: 2019-07-04 | Discharge: 2019-07-04 | Disposition: A | Discharge status: Home / Self Care | Payer: Medicare HMO | Attending: Orthopedic Surgery | Admitting: Orthopedic Surgery

## 2019-07-04 ENCOUNTER — Encounter: Payer: Self-pay | Admitting: *Deleted

## 2019-07-04 ENCOUNTER — Other Ambulatory Visit
Admission: RE | Admit: 2019-07-04 | Discharge: 2019-07-04 | Disposition: A | Discharge status: Home / Self Care | Payer: Medicare HMO | Source: Ambulatory Visit | Attending: Orthopedic Surgery | Admitting: Orthopedic Surgery

## 2019-07-04 ENCOUNTER — Ambulatory Visit: Payer: Medicare HMO

## 2019-07-04 DIAGNOSIS — X58XXXA Exposure to other specified factors, initial encounter: Secondary | ICD-10-CM | POA: Insufficient documentation

## 2019-07-04 DIAGNOSIS — Z419 Encounter for procedure for purposes other than remedying health state, unspecified: Secondary | ICD-10-CM

## 2019-07-04 DIAGNOSIS — Z20828 Contact with and (suspected) exposure to other viral communicable diseases: Secondary | ICD-10-CM | POA: Diagnosis not present

## 2019-07-04 DIAGNOSIS — S22080A Wedge compression fracture of T11-T12 vertebra, initial encounter for closed fracture: Secondary | ICD-10-CM | POA: Diagnosis not present

## 2019-07-04 HISTORY — PX: KYPHOPLASTY: SHX5884

## 2019-07-04 LAB — SARS CORONAVIRUS 2 BY RT PCR (HOSPITAL ORDER, PERFORMED IN ~~LOC~~ HOSPITAL LAB): SARS Coronavirus 2: NEGATIVE

## 2019-07-04 SURGERY — KYPHOPLASTY
Anesthesia: General | Site: Spine Thoracic

## 2019-07-04 MED ORDER — BUPIVACAINE-EPINEPHRINE (PF) 0.5% -1:200000 IJ SOLN
INTRAMUSCULAR | Status: DC | PRN
  Administered 2019-07-04: 20 mL via PERINEURAL

## 2019-07-04 MED ORDER — CEFAZOLIN SODIUM-DEXTROSE 2-4 GM/100ML-% IV SOLN
2.0000 g | Freq: Once | INTRAVENOUS | Status: AC
  Administered 2019-07-04: 2 g via INTRAVENOUS

## 2019-07-04 MED ORDER — FENTANYL CITRATE (PF) 100 MCG/2ML IJ SOLN
INTRAMUSCULAR | Status: AC
  Administered 2019-07-04: 25 ug via INTRAVENOUS
  Filled 2019-07-04: qty 2

## 2019-07-04 MED ORDER — FENTANYL CITRATE (PF) 100 MCG/2ML IJ SOLN
25.0000 ug | INTRAMUSCULAR | Status: DC | PRN
  Administered 2019-07-04 (×4): 25 ug via INTRAVENOUS

## 2019-07-04 MED ORDER — MIDAZOLAM HCL 2 MG/2ML IJ SOLN
INTRAMUSCULAR | Status: AC
  Filled 2019-07-04: qty 2

## 2019-07-04 MED ORDER — FAMOTIDINE 20 MG PO TABS
ORAL_TABLET | ORAL | Status: AC
  Filled 2019-07-04: qty 1

## 2019-07-04 MED ORDER — OXYCODONE-ACETAMINOPHEN 5-325 MG PO TABS
ORAL_TABLET | ORAL | Status: AC
  Filled 2019-07-04: qty 1

## 2019-07-04 MED ORDER — LIDOCAINE 2% (20 MG/ML) 5 ML SYRINGE
INTRAMUSCULAR | Status: DC | PRN
  Administered 2019-07-04: 25 mg via INTRAVENOUS

## 2019-07-04 MED ORDER — IOHEXOL 180 MG/ML  SOLN
INTRAMUSCULAR | Status: DC | PRN
  Administered 2019-07-04 (×2): 20 mL via INTRAVENOUS

## 2019-07-04 MED ORDER — PROPOFOL 10 MG/ML IV BOLUS
INTRAVENOUS | Status: DC | PRN
  Administered 2019-07-04: 20 mg via INTRAVENOUS

## 2019-07-04 MED ORDER — ONDANSETRON HCL 4 MG/2ML IJ SOLN: 4.0000 mg | Freq: Once | INTRAMUSCULAR | Status: DC | PRN

## 2019-07-04 MED ORDER — BUPIVACAINE HCL (PF) 0.5 % IJ SOLN
INTRAMUSCULAR | Status: AC
  Filled 2019-07-04: qty 30

## 2019-07-04 MED ORDER — BUPIVACAINE-EPINEPHRINE (PF) 0.5% -1:200000 IJ SOLN
INTRAMUSCULAR | Status: AC
  Filled 2019-07-04: qty 30

## 2019-07-04 MED ORDER — CEFAZOLIN SODIUM-DEXTROSE 2-4 GM/100ML-% IV SOLN
INTRAVENOUS | Status: AC
  Filled 2019-07-04: qty 100

## 2019-07-04 MED ORDER — LIDOCAINE HCL (PF) 1 % IJ SOLN
INTRAMUSCULAR | Status: AC
  Filled 2019-07-04: qty 60

## 2019-07-04 MED ORDER — MIDAZOLAM HCL 5 MG/5ML IJ SOLN
INTRAMUSCULAR | Status: DC | PRN
  Administered 2019-07-04: 0.5 mg via INTRAVENOUS
  Administered 2019-07-04: 1 mg via INTRAVENOUS
  Administered 2019-07-04: 0.5 mg via INTRAVENOUS

## 2019-07-04 MED ORDER — FAMOTIDINE 20 MG PO TABS: 20.0000 mg | ORAL_TABLET | Freq: Once | ORAL | Status: DC

## 2019-07-04 MED ORDER — LIDOCAINE HCL 1 % IJ SOLN
INTRAMUSCULAR | Status: DC | PRN
  Administered 2019-07-04: 20 mL

## 2019-07-04 MED ORDER — OXYCODONE-ACETAMINOPHEN 5-325 MG PO TABS: 1.0000 | ORAL_TABLET | ORAL | 0 refills | Status: DC | PRN

## 2019-07-04 MED ORDER — LACTATED RINGERS IV SOLN
INTRAVENOUS | Status: DC
  Administered 2019-07-04 (×2): via INTRAVENOUS

## 2019-07-04 MED ORDER — OXYCODONE-ACETAMINOPHEN 5-325 MG PO TABS
1.0000 | ORAL_TABLET | Freq: Once | ORAL | Status: AC
  Administered 2019-07-04: 16:00:00 1 via ORAL

## 2019-07-04 MED ORDER — KETAMINE HCL 50 MG/ML IJ SOLN
INTRAMUSCULAR | Status: DC | PRN
  Administered 2019-07-04: 10 mg via INTRAMUSCULAR
  Administered 2019-07-04: 15 mg via INTRAVENOUS

## 2019-07-04 MED ORDER — PROPOFOL 500 MG/50ML IV EMUL
INTRAVENOUS | Status: DC | PRN
  Administered 2019-07-04: 15 ug/kg/min via INTRAVENOUS

## 2019-07-04 SURGICAL SUPPLY — 22 items
CEMENT KYPHON CX01A KIT/MIXER (Cement) ×3 IMPLANT
COVER WAND RF STERILE (DRAPES) ×3 IMPLANT
DERMABOND ADVANCED (GAUZE/BANDAGES/DRESSINGS) ×2
DERMABOND ADVANCED .7 DNX12 (GAUZE/BANDAGES/DRESSINGS) ×1 IMPLANT
DEVICE BIOPSY BONE KYPH (INSTRUMENTS) ×2 IMPLANT
DEVICE BIOPSY BONE KYPHX (INSTRUMENTS) ×3 IMPLANT
DRAPE C-ARM XRAY 36X54 (DRAPES) ×3 IMPLANT
DURAPREP 26ML APPLICATOR (WOUND CARE) ×3 IMPLANT
FEE RENTAL RFA GENERATOR (MISCELLANEOUS) IMPLANT
GLOVE SURG SYN 9.0  PF PI (GLOVE) ×2
GLOVE SURG SYN 9.0 PF PI (GLOVE) ×1 IMPLANT
GOWN SRG 2XL LVL 4 RGLN SLV (GOWNS) ×1 IMPLANT
GOWN STRL NON-REIN 2XL LVL4 (GOWNS) ×2
GOWN STRL REUS W/ TWL LRG LVL3 (GOWN DISPOSABLE) ×1 IMPLANT
GOWN STRL REUS W/TWL LRG LVL3 (GOWN DISPOSABLE) ×2
PACK KYPHOPLASTY (MISCELLANEOUS) ×3 IMPLANT
RENTAL RFA  GENERATOR (MISCELLANEOUS)
RENTAL RFA GENERATOR (MISCELLANEOUS) IMPLANT
STRAP SAFETY 5IN WIDE (MISCELLANEOUS) ×3 IMPLANT
TRAY KYPHOPAK 15/2 EXPRESS (KITS) ×2 IMPLANT
TRAY KYPHOPAK 15/3 EXPRESS 1ST (MISCELLANEOUS) ×3 IMPLANT
TRAY KYPHOPAK 20/3 EXPRESS 1ST (MISCELLANEOUS) ×3 IMPLANT

## 2019-07-04 NOTE — Progress Notes (Signed)
Pt continues to rate pain 10 out of 10 after giving fentanyl 65mcg x 4. Pt sleeping between care. Up to bedside commode and in recliner. Spoke with Dr. Andree Elk who ordered percocet 5/325mg  once.  Pt sleeping at this time. Once awake, will give pt fluids and crackers before PO pain medication.

## 2019-07-04 NOTE — Discharge Instructions (Signed)
Take it easy today and tomorrow and try not to lift over 5 pounds for the next 2 weeks.  Remove Band-Aids on Sunday then okay to shower.  Pain medicine as directed.  Call Monday if you are having problems.   AMBULATORY SURGERY  DISCHARGE INSTRUCTIONS   1) The drugs that you were given will stay in your system until tomorrow so for the next 24 hours you should not:  A) Drive an automobile B) Make any legal decisions C) Drink any alcoholic beverage   2) You may resume regular meals tomorrow.  Today it is better to start with liquids and gradually work up to solid foods.  You may eat anything you prefer, but it is better to start with liquids, then soup and crackers, and gradually work up to solid foods.   3) Please notify your doctor immediately if you have any unusual bleeding, trouble breathing, redness and pain at the surgery site, drainage, fever, or pain not relieved by medication.    4) Additional Instructions:        Please contact your physician with any problems or Same Day Surgery at [phone], Monday through Friday 6 am to 4 pm, or [place] at [facility] number at [phone].

## 2019-07-04 NOTE — Anesthesia Post-op Follow-up Note (Signed)
Anesthesia QCDR form completed.        

## 2019-07-04 NOTE — Transfer of Care (Signed)
Immediate Anesthesia Transfer of Care Note  Patient: Veronica Hubbard  Procedure(s) Performed: T12 KYPHOPLASTY (N/A Spine Thoracic)  Patient Location: PACU  Anesthesia Type:General  Level of Consciousness: sedated  Airway & Oxygen Therapy: Patient Spontanous Breathing and Patient connected to nasal cannula oxygen  Post-op Assessment: Report given to RN and Post -op Vital signs reviewed and stable  Post vital signs: Reviewed  Last Vitals:  Vitals Value Taken Time  BP 156/59 07/04/19 1302  Temp    Pulse 60 07/04/19 1302  Resp 10 07/04/19 1302  SpO2 100 % 07/04/19 1302  Vitals shown include unvalidated device data.  Last Pain:  Vitals:   07/04/19 0948  PainSc: 10-Worst pain ever         Complications: No apparent anesthesia complications

## 2019-07-04 NOTE — H&P (Signed)
Reviewed paper H+P, will be scanned into chart. No changes noted.  

## 2019-07-04 NOTE — Op Note (Signed)
Date 07/04/2019  time 1:00 PM   PATIENT:  Veronica Hubbard   PRE-OPERATIVE DIAGNOSIS:  closed wedge compression fracture of T12   POST-OPERATIVE DIAGNOSIS:  closed wedge compression fracture of T12   PROCEDURE:  Procedure(s): KYPHOPLASTY T12  SURGEON: Laurene Footman, MD   ASSISTANTS: None   ANESTHESIA:   local and MAC   EBL:  No intake/output data recorded.   BLOOD ADMINISTERED:none   DRAINS: none    LOCAL MEDICATIONS USED:  MARCAINE    and XYLOCAINE    SPECIMEN:   T12 vertebral body biopsy   DISPOSITION OF SPECIMEN:  Pathology   COUNTS:  YES   TOURNIQUET:  * No tourniquets in log *   IMPLANTS: Bone cement   DICTATION: .Dragon Dictation  patient was brought to the operating room and after adequate anesthesia was obtained the patient was placed prone.  C arm was brought in in good visualization of the affected level obtained on both AP and lateral projections.  After patient identification and timeout procedures were completed, local anesthetic was infiltrated with 10 cc 1% Xylocaine infiltrated subcutaneously.  This is done the area on the right side of the planned approach.  The back was then prepped and draped in the usual sterile manner and repeat timeout procedure carried out.  A spinal needle was brought down to the pedicle on the both sides of  T12  and a 50-50 mix of 1% Xylocaine half percent Sensorcaine with epinephrine total of 20 cc injected.  After allowing this to set a small incision was made and the trocar was advanced into the vertebral body in an trans-pedicular fashion on each side.  Biopsy was obtained Drilling was carried out balloon inserted with inflation to  2 cc on the right and 1-1/2 cc on the left.  When the cement was appropriate consistency a total of 5 cc were injected into the vertebral body without extravasation, good fill superior to inferior endplates and from right to left sides along the inferior endplate.  After the cement had set the trochar  was removed and permanent C-arm views obtained.  The wound was closed with Dermabond followed by Band-Aid   PLAN OF CARE: Discharge to home after PACU   PATIENT DISPOSITION:  PACU - hemodynamically stable.

## 2019-07-05 ENCOUNTER — Ambulatory Visit: Payer: Medicare HMO

## 2019-07-06 ENCOUNTER — Encounter: Payer: Self-pay | Admitting: Orthopedic Surgery

## 2019-07-07 NOTE — Anesthesia Postprocedure Evaluation (Signed)
Anesthesia Post Note  Patient: Veronica Hubbard  Procedure(s) Performed: T12 KYPHOPLASTY (N/A Spine Thoracic)  Patient location during evaluation: PACU Anesthesia Type: General Level of consciousness: awake and alert Pain management: pain level controlled Vital Signs Assessment: post-procedure vital signs reviewed and stable Respiratory status: spontaneous breathing, nonlabored ventilation, respiratory function stable and patient connected to nasal cannula oxygen Cardiovascular status: blood pressure returned to baseline and stable Postop Assessment: no apparent nausea or vomiting Anesthetic complications: no     Last Vitals:  Vitals:   07/04/19 1502 07/04/19 1522  BP: 109/64   Pulse: 61   Resp: 18   Temp: (!) 36.1 C (!) 36.3 C  SpO2: 95%     Last Pain:  Vitals:   07/04/19 1531  TempSrc:   PainSc: 5                  Molli Barrows

## 2019-07-08 LAB — SURGICAL PATHOLOGY

## 2019-07-10 ENCOUNTER — Emergency Department
Admission: EM | Admit: 2019-07-10 | Discharge: 2019-07-10 | Disposition: A | Discharge status: Home / Self Care | Payer: Medicare HMO | Attending: Student in an Organized Health Care Education/Training Program | Admitting: Student in an Organized Health Care Education/Training Program

## 2019-07-10 ENCOUNTER — Other Ambulatory Visit: Payer: Self-pay

## 2019-07-10 ENCOUNTER — Encounter: Payer: Self-pay | Admitting: Emergency Medicine

## 2019-07-10 ENCOUNTER — Emergency Department: Payer: Medicare HMO

## 2019-07-10 DIAGNOSIS — J449 Chronic obstructive pulmonary disease, unspecified: Secondary | ICD-10-CM | POA: Diagnosis not present

## 2019-07-10 DIAGNOSIS — G8929 Other chronic pain: Secondary | ICD-10-CM | POA: Insufficient documentation

## 2019-07-10 DIAGNOSIS — M545 Low back pain, unspecified: Secondary | ICD-10-CM

## 2019-07-10 DIAGNOSIS — I2510 Atherosclerotic heart disease of native coronary artery without angina pectoris: Secondary | ICD-10-CM | POA: Diagnosis not present

## 2019-07-10 DIAGNOSIS — E039 Hypothyroidism, unspecified: Secondary | ICD-10-CM | POA: Insufficient documentation

## 2019-07-10 DIAGNOSIS — Z79899 Other long term (current) drug therapy: Secondary | ICD-10-CM | POA: Diagnosis not present

## 2019-07-10 DIAGNOSIS — I10 Essential (primary) hypertension: Secondary | ICD-10-CM | POA: Insufficient documentation

## 2019-07-10 DIAGNOSIS — F17210 Nicotine dependence, cigarettes, uncomplicated: Secondary | ICD-10-CM | POA: Diagnosis not present

## 2019-07-10 LAB — COMPREHENSIVE METABOLIC PANEL
ALT: 8 U/L (ref 0–44)
AST: 19 U/L (ref 15–41)
Albumin: 3.3 g/dL — ABNORMAL LOW (ref 3.5–5.0)
Alkaline Phosphatase: 81 U/L (ref 38–126)
Anion gap: 8 (ref 5–15)
BUN: 14 mg/dL (ref 8–23)
CO2: 23 mmol/L (ref 22–32)
Calcium: 9.8 mg/dL (ref 8.9–10.3)
Chloride: 106 mmol/L (ref 98–111)
Creatinine, Ser: 0.93 mg/dL (ref 0.44–1.00)
GFR calc Af Amer: >60 mL/min (ref >60)
GFR calc non Af Amer: 57 mL/min — ABNORMAL LOW (ref >60)
Glucose, Bld: 125 mg/dL — ABNORMAL HIGH (ref 70–99)
Potassium: 3.4 mmol/L — ABNORMAL LOW (ref 3.5–5.1)
Sodium: 137 mmol/L (ref 135–145)
Total Bilirubin: 0.4 mg/dL (ref 0.3–1.2)
Total Protein: 5.9 g/dL — ABNORMAL LOW (ref 6.5–8.1)

## 2019-07-10 LAB — CBC WITH DIFFERENTIAL/PLATELET
Abs Immature Granulocytes: 1.92 10*3/uL — ABNORMAL HIGH (ref 0.00–0.07)
Basophils Absolute: 0.3 10*3/uL — ABNORMAL HIGH (ref 0.0–0.1)
Basophils Relative: 2 %
Eosinophils Absolute: 0.9 10*3/uL — ABNORMAL HIGH (ref 0.0–0.5)
Eosinophils Relative: 5 %
HCT: 37.9 % (ref 36.0–46.0)
Hemoglobin: 12.2 g/dL (ref 12.0–15.0)
Immature Granulocytes: 11 %
Lymphocytes Relative: 11 %
Lymphs Abs: 1.9 10*3/uL (ref 0.7–4.0)
MCH: 27.7 pg (ref 26.0–34.0)
MCHC: 32.2 g/dL (ref 30.0–36.0)
MCV: 85.9 fL (ref 80.0–100.0)
Monocytes Absolute: 0.8 10*3/uL (ref 0.1–1.0)
Monocytes Relative: 5 %
Neutro Abs: 11.7 10*3/uL — ABNORMAL HIGH (ref 1.7–7.7)
Neutrophils Relative %: 66 %
Platelets: 414 10*3/uL — ABNORMAL HIGH (ref 150–400)
RBC: 4.41 MIL/uL (ref 3.87–5.11)
RDW: 19.7 % — ABNORMAL HIGH (ref 11.5–15.5)
Smear Review: NORMAL
WBC: 17.6 10*3/uL — ABNORMAL HIGH (ref 4.0–10.5)
nRBC: 0.2 % (ref 0.0–0.2)

## 2019-07-10 MED ORDER — LIDOCAINE 5 % EX PTCH
1.0000 | MEDICATED_PATCH | CUTANEOUS | Status: DC
  Administered 2019-07-10 (×2): 1 via TRANSDERMAL
  Filled 2019-07-10 (×2): qty 1

## 2019-07-10 MED ORDER — LIDOCAINE 5 % EX PTCH: 1.0000 | MEDICATED_PATCH | Freq: Two times a day (BID) | CUTANEOUS | 0 refills | Status: AC

## 2019-07-10 NOTE — Discharge Instructions (Addendum)

## 2019-07-10 NOTE — ED Provider Notes (Signed)
[facility] Emergency Department Provider Note    None    (approximate)  I have reviewed the triage vital signs and the nursing notes.   HISTORY  Chief Complaint Back Pain and Post-op Problem    HPI Veronica Hubbard is a 84 y.o. female extensive past medical history as listed below with recent kyphoplasty procedure presents to the ER for persistent right low back pain.  States this is chronic pain.  Feels that the pain is somewhat improved after she took oxycodone prior to EMS transfer.  EMS was reportedly concerned about her living condition.  This was discussed with the patient.  States she does not want to consider skilled nursing placement or additional rehab.  She already has PT at home in place and would like to continue with this.  Denies any fevers.  No no numbness or tingling.  Denies any interval trauma.    Past Medical History:  Diagnosis Date  . Anemia   . Anxiety   . Aortic anomaly    stenosis  . Asthma   . Cancer ([place]) 02/2018   bone marrow cancer  . Chronic kidney disease   . COPD (chronic obstructive pulmonary disease) ([place])   . Coronary artery disease   . Depression   . GERD (gastroesophageal reflux disease)   . Hypertension   . Hypothyroidism   . Personal history of chemotherapy   . Spinal stenosis    Family History  Problem Relation Age of Onset  . Leukemia Mother   . CAD Father   . Diabetes Father   . Breast cancer Sister 29   Past Surgical History:  Procedure Laterality Date  . ABDOMINAL HYSTERECTOMY    . BACK SURGERY    . BREAST BIOPSY Left 2003   neg  . cataracts    . KYPHOPLASTY N/A 04/22/2019   Procedure: KYPHOPLASTY;  Surgeon: Hessie Knows, MD;  Location: [facility] ORS;  Service: Orthopedics;  Laterality: N/A;  . KYPHOPLASTY N/A 04/10/2019   Procedure: KYPHOPLASTY L3;  Surgeon: Hessie Knows, MD;  Location: [facility] ORS;  Service: Orthopedics;  Laterality: N/A;  . KYPHOPLASTY N/A 05/13/2019   Procedure:  KYPHOPLASTY L1 L2;  Surgeon: Hessie Knows, MD;  Location: [facility] ORS;  Service: Orthopedics;  Laterality: N/A;  . KYPHOPLASTY N/A 06/19/2019   Procedure: L5 KYPHOPLASTY;  Surgeon: Hessie Knows, MD;  Location: [facility] ORS;  Service: Orthopedics;  Laterality: N/A;  . KYPHOPLASTY N/A 07/04/2019   Procedure: T12 KYPHOPLASTY;  Surgeon: Hessie Knows, MD;  Location: [facility] ORS;  Service: Orthopedics;  Laterality: N/A;  . lipoma removal    . SHOULDER SURGERY     Patient Active Problem List   Diagnosis Date Noted  . MDS (myelodysplastic syndrome) ([place])   . History of compression fracture of spine   . Palliative care by specialist   . Severe back pain 05/20/2019  . Compression fracture of lumbar spine, non-traumatic ([place]) 05/11/2019  . Intractable pain 04/19/2019  . AAA (abdominal aortic aneurysm) without rupture ([place]) 04/09/2017  . Bilateral carotid artery stenosis 04/09/2017  . Thoracic aortic aneurysm without rupture ([place]) 04/09/2017  . Hyponatremia 02/23/2016  . Chest pain 02/23/2016  . Gastroenteritis due to norovirus 02/09/2016      Prior to Admission medications   Medication Sig Start Date End Date Taking? Authorizing Provider  acetaminophen (TYLENOL) 325 MG tablet Take 2 tablets (650 mg total) by mouth every 6 (six) hours as needed for mild pain (or Fever >/= 101). 04/27/19   Mayo, [employer]  Nicola Girt, MD  albuterol (PROVENTIL) (2.5 MG/3ML) 0.083% nebulizer solution Take 2.5 mg by nebulization every 6 (six) hours as needed for wheezing or shortness of breath.    [provider]  alendronate (FOSAMAX) 70 MG tablet Take 70 mg by mouth every Monday. Take with a full glass of water on an empty stomach.    [provider]  allopurinol (ZYLOPRIM) 100 MG tablet Take 200 mg by mouth daily. 05/04/18   [provider]  azithromycin (ZITHROMAX) 250 MG tablet Take 250 mg by mouth every Monday, Wednesday, and Friday.    [provider]  Cholecalciferol (VITAMIN D3) 25 MCG (1000  UT) CAPS Take 1,000 Units by mouth daily.    [provider]  citalopram (CELEXA) 20 MG tablet Take 20 mg by mouth daily.    [provider]  cyclobenzaprine (FLEXERIL) 5 MG tablet Take 5 mg by mouth 3 (three) times daily.     [provider]  hydrALAZINE (APRESOLINE) 50 MG tablet Take 1 tablet (50 mg total) by mouth every 8 (eight) hours. 04/27/19   Mayo, Pete Pelt, MD  isosorbide mononitrate (IMDUR) 30 MG 24 hr tablet Take 30 mg by mouth daily.    [provider]  levothyroxine (SYNTHROID) 112 MCG tablet Take 112 mcg by mouth daily before breakfast.    [provider]  lidocaine (LIDODERM) 5 % Place 1 patch onto the skin every 12 (twelve) hours. Remove & Discard patch within 12 hours or as directed by MD 07/10/19 07/09/20  Merlyn Lot, MD  Melatonin 5 MG TABS Take 5 mg by mouth at bedtime.    [provider]  metoCLOPramide (REGLAN) 5 MG tablet Take 1 tablet (5 mg total) by mouth every 8 (eight) hours as needed for nausea or refractory nausea / vomiting. 05/26/19   Lang Snow, NP  metoprolol tartrate (LOPRESSOR) 25 MG tablet Take 1 tablet (25 mg total) by mouth 2 (two) times daily. 05/19/19   Vaughan Basta, MD  Omega-3 Fatty Acids (FISH OIL) 1000 MG CAPS Take 1,000 mg by mouth at bedtime.    [provider]  oxyCODONE-acetaminophen (PERCOCET) 5-325 MG tablet Take 1 tablet by mouth every 4 (four) hours as needed for severe pain. 07/04/19 07/03/20  Hessie Knows, MD  oxyCODONE-acetaminophen (PERCOCET) 7.5-325 MG tablet Take 1 tablet by mouth every 6 (six) hours as needed for moderate pain or severe pain. 05/19/19   Vaughan Basta, MD  oxyCODONE-acetaminophen (PERCOCET) 7.5-325 MG tablet Take 1 tablet by mouth every 4 (four) hours as needed for severe pain. 06/19/19 06/18/20  Hessie Knows, MD  pantoprazole (PROTONIX) 40 MG tablet Take 40 mg by mouth 2 (two) times daily.    [provider]  polyethylene  glycol (MIRALAX / GLYCOLAX) 17 g packet Take 17 g by mouth daily as needed. Patient taking differently: Take 17 g by mouth daily as needed (constipation.).  04/27/19   Mayo, Pete Pelt, MD  sertraline (ZOLOFT) 25 MG tablet Take 1 tablet (25 mg total) by mouth daily. 05/20/19   Vaughan Basta, MD  TRELEGY ELLIPTA 100-62.5-25 MCG/INH AEPB Inhale 1 puff into the lungs at bedtime.    [provider]  triamcinolone cream (KENALOG) 0.5 % Apply 1 application topically 2 (two) times daily as needed (skin rash).     [provider]    Allergies Aspirin, Celecoxib, Iodinated diagnostic agents, Losartan, Morphine, and Verapamil    Social History Social History   Tobacco Use  . Smoking status: Current Every Day  Smoker    Types: Cigarettes  . Smokeless tobacco: Never Used  Substance Use Topics  . Alcohol use: No  . Drug use: No    Review of Systems Patient denies headaches, rhinorrhea, blurry vision, numbness, shortness of breath, chest pain, edema, cough, abdominal pain, nausea, vomiting, diarrhea, dysuria, fevers, rashes or hallucinations unless otherwise stated above in HPI. ____________________________________________   PHYSICAL EXAM:  VITAL SIGNS: Vitals:   07/10/19 1553 07/10/19 1555  BP: 133/64   Pulse: (!) 58   Resp: 18   Temp:  98.3 F (36.8 C)  SpO2: 100%     Constitutional: Alert and oriented.  Eyes: Conjunctivae are normal.  Head: Atraumatic. Nose: No congestion/rhinnorhea. Mouth/Throat: Mucous membranes are moist.   Neck: No stridor. Painless ROM.  Cardiovascular: Normal rate, regular rhythm. Grossly normal heart sounds.  Good peripheral circulation. Respiratory: Normal respiratory effort.  No retractions. Lungs CTAB. Gastrointestinal: Soft and nontender. No distention. No abdominal bruits. No CVA tenderness. Genitourinary:  Musculoskeletal: riight ttp overlying right SI joint, no erythema or cellulitis. No lower extremity tenderness nor  edema.  No joint effusions. Neurologic:  Normal speech and language. No gross focal neurologic deficits are appreciated. No facial droop Skin:  Skin is warm, dry and intact. No rash noted. Psychiatric: Mood and affect are normal. Speech and behavior are normal.  ____________________________________________   LABS (all labs ordered are listed, but only abnormal results are displayed)  Results for orders placed or performed during the hospital encounter of 07/10/19 (from the past 24 hour(s))  CBC with Differential/Platelet     Status: Abnormal   Collection Time: 07/10/19  4:48 PM  Result Value Ref Range   WBC 17.6 (H) 4.0 - 10.5 K/uL   RBC 4.41 3.87 - 5.11 MIL/uL   Hemoglobin 12.2 12.0 - 15.0 g/dL   HCT 37.9 36.0 - 46.0 %   MCV 85.9 80.0 - 100.0 fL   MCH 27.7 26.0 - 34.0 pg   MCHC 32.2 30.0 - 36.0 g/dL   RDW 19.7 (H) 11.5 - 15.5 %   Platelets 414 (H) 150 - 400 K/uL   nRBC 0.2 0.0 - 0.2 %   Neutrophils Relative % 66 %   Neutro Abs 11.7 (H) 1.7 - 7.7 K/uL   Lymphocytes Relative 11 %   Lymphs Abs 1.9 0.7 - 4.0 K/uL   Monocytes Relative 5 %   Monocytes Absolute 0.8 0.1 - 1.0 K/uL   Eosinophils Relative 5 %   Eosinophils Absolute 0.9 (H) 0.0 - 0.5 K/uL   Basophils Relative 2 %   Basophils Absolute 0.3 (H) 0.0 - 0.1 K/uL   WBC Morphology TOXIC GRANULATION    RBC Morphology MORPHOLOGY UNREMARKABLE    Smear Review Normal platelet morphology    Immature Granulocytes 11 %   Abs Immature Granulocytes 1.92 (H) 0.00 - 0.07 K/uL  Comprehensive metabolic panel     Status: Abnormal   Collection Time: 07/10/19  4:48 PM  Result Value Ref Range   Sodium 137 135 - 145 mmol/L   Potassium 3.4 (L) 3.5 - 5.1 mmol/L   Chloride 106 98 - 111 mmol/L   CO2 23 22 - 32 mmol/L   Glucose, Bld 125 (H) 70 - 99 mg/dL   BUN 14 8 - 23 mg/dL   Creatinine, Ser 0.93 0.44 - 1.00 mg/dL   Calcium 9.8 8.9 - 10.3 mg/dL   Total Protein 5.9 (L) 6.5 - 8.1 g/dL   Albumin 3.3 (L) 3.5 - 5.0 g/dL   AST 19  15 - 41 U/L    ALT 8 0 - 44 U/L   Alkaline Phosphatase 81 38 - 126 U/L   Total Bilirubin 0.4 0.3 - 1.2 mg/dL   GFR calc non Af Amer 57 (L) >60 mL/min   GFR calc Af Amer >60 >60 mL/min   Anion gap 8 5 - 15   ____________________________________________  ____________________________________________  RADIOLOGY  I personally reviewed all radiographic images ordered to evaluate for the above acute complaints and reviewed radiology reports and findings.  These findings were personally discussed with the patient.  Please see medical record for radiology report.  ____________________________________________   PROCEDURES  Procedure(s) performed:  Procedures    Critical Care performed: no ____________________________________________   INITIAL IMPRESSION / ASSESSMENT AND PLAN / ED COURSE  Pertinent labs & imaging results that were available during my care of the patient were reviewed by me and considered in my medical decision making (see chart for details).   DDX: fracture,dislocation, msk strain,  Feliciana Rolfson is a 84 y.o. who presents to the ED with symptoms as described above.  Patient nontoxic-appearing.  Multiple chronic medical issues with recent kyphoplasty.  Neurovascularly intact.  Likely musculoskeletal strain.  Will provide pain medication.  Her abdominal exam is soft and benign.  I have a low suspicion for cauda equina or stenosis.  Doubt postop infection.  Clinical Course as of Jul 10 1815  Thu Jul 10, 2019  1813 Patient with improvement in symptoms.  X-ray without any evidence of acute fracture.  She is neuro intact.  Feels comfortable going back home.  Felt improved after Lidoderm patch.  Will prescribe this.   [PR]    Clinical Course User Index [PR] Merlyn Lot, MD    The patient was evaluated in Emergency Department today for the symptoms described in the history of present illness. He/she was evaluated in the context of the global COVID-19 pandemic, which  necessitated consideration that the patient might be at risk for infection with the SARS-CoV-2 virus that causes COVID-19. Institutional protocols and algorithms that pertain to the evaluation of patients at risk for COVID-19 are in a state of rapid change based on information released by regulatory bodies including the CDC and federal and state organizations. These policies and algorithms were followed during the patient's care in the ED.  As part of my medical decision making, I reviewed the following data within the [facility] notes reviewed and incorporated, Labs reviewed, notes from prior ED visits and [place] Controlled Substance Database   ____________________________________________   FINAL CLINICAL IMPRESSION(S) / ED DIAGNOSES  Final diagnoses:  Chronic left-sided low back pain without sciatica      NEW MEDICATIONS STARTED DURING THIS VISIT:  New Prescriptions   LIDOCAINE (LIDODERM) 5 %    Place 1 patch onto the skin every 12 (twelve) hours. Remove & Discard patch within 12 hours or as directed by MD     Note:  This document was prepared using Dragon voice recognition software and may include unintentional dictation errors.    Merlyn Lot, MD 07/10/19 1816

## 2019-07-10 NOTE — ED Notes (Addendum)
Feel that pt is not safe to sit in lobby until ride gets here. Charge RN verbal okay to switch pt to hallway bed until ride arrives.

## 2019-07-10 NOTE — ED Notes (Signed)
Pt's ride [place] notified pt being d/c. [name] states she is on her way now. States may be 25mins. [phone].

## 2019-07-10 NOTE — ED Triage Notes (Signed)
Patient presents to the ED via EMS from home with lower back pain for several weeks.  Patient had a lumbar fusion surgery on Friday.  Patient has been taking pain medication as prescribed.  Patient is assisted at home by an elderly sister.  EMS states they are concerned the living situation may be unsafe.  Patient is alert and oriented x 4.

## 2019-07-10 NOTE — TOC Initial Note (Signed)
Transition of Care [facility]) - Initial/Assessment Note    Patient Details  Name: Veronica Hubbard MRN: [id number] Date of Birth: 11/08/1934  Transition of Care [facility]) CM/SW Contact:    Fredric Mare, LCSW Phone Number: 07/10/2019, 4:41 PM  Clinical Narrative:                  Patient is an 84 year old female that presents to the ED for lower back pain. Patient states that she has been experiencing lower back pain since last week, after she had surgery. Patient reports that her sister has been staying with her in her home since her surgery. Patient also receives home health services, and has a physical therapist. Patient does not remember the name of the agency. Patient uses a walker and wheelchair to get around at home.  Patient reported that she did not want to go to SNF, and would like to continue to receiving services at home.  EDP notified.     Expected Discharge Plan: [facility](patient stated that she already recieves home health PT and is not aware of the agency)     Patient Goals and CMS Choice        Expected Discharge Plan and Services Expected Discharge Plan: [facility] [place] [facility](patient stated that she already recieves home health PT and is not aware of the agency)   Discharge Planning Services: CM Consult   Living arrangements for the past 2 months: Single Family Home                                      Prior Living Arrangements/Services Living arrangements for the past 2 months: Single Family Home Lives with:: Self(patient's sister has been staying with her after her surgery) Patient language and need for interpreter reviewed:: Yes Do you feel safe going back to the place where you live?: Yes      Need for Family Participation in Patient Care: Yes (Comment) Care giver support system in place?: No (comment) Current home services: Home PT, DME(patient uses a walker, and wheelchair) Criminal Activity/Legal Involvement Pertinent  to Current Situation/Hospitalization: No - Comment as needed  Activities of Daily Living      Permission Sought/Granted                  Emotional Assessment Appearance:: Appears stated age Attitude/Demeanor/Rapport: Engaged Affect (typically observed): Appropriate Orientation: : Oriented to Self, Oriented to Place, Oriented to  Time, Oriented to Situation Alcohol / Substance Use: Tobacco Use Psych Involvement: No (comment)  Admission diagnosis:  back pain Patient Active Problem List   Diagnosis Date Noted  . MDS (myelodysplastic syndrome) ([place])   . History of compression fracture of spine   . Palliative care by specialist   . Severe back pain 05/20/2019  . Compression fracture of lumbar spine, non-traumatic ([place]) 05/11/2019  . Intractable pain 04/19/2019  . AAA (abdominal aortic aneurysm) without rupture ([place]) 04/09/2017  . Bilateral carotid artery stenosis 04/09/2017  . Thoracic aortic aneurysm without rupture ([place]) 04/09/2017  . Hyponatremia 02/23/2016  . Chest pain 02/23/2016  . Gastroenteritis due to norovirus 02/09/2016   PCP:  Zhou-Talbert, Elwyn Lade, MD [facility]acy:   [facility] [place] [address] [place] [address] Phone: [phone] Fax: [phone]     Social Determinants of Health (SDOH) Interventions    Readmission Risk Interventions  No flowsheet data found.

## 2019-07-22 ENCOUNTER — Emergency Department: Payer: Medicare HMO

## 2019-07-22 ENCOUNTER — Inpatient Hospital Stay
Admission: EM | Admit: 2019-07-22 | Discharge: 2019-07-29 | DRG: 482 | Disposition: A | Discharge status: Home Health | Payer: Medicare HMO | Attending: Specialist | Admitting: Specialist

## 2019-07-22 ENCOUNTER — Other Ambulatory Visit: Payer: Self-pay

## 2019-07-22 DIAGNOSIS — M4800 Spinal stenosis, site unspecified: Secondary | ICD-10-CM | POA: Diagnosis present

## 2019-07-22 DIAGNOSIS — E876 Hypokalemia: Secondary | ICD-10-CM | POA: Diagnosis present

## 2019-07-22 DIAGNOSIS — I350 Nonrheumatic aortic (valve) stenosis: Secondary | ICD-10-CM | POA: Diagnosis present

## 2019-07-22 DIAGNOSIS — F329 Major depressive disorder, single episode, unspecified: Secondary | ICD-10-CM | POA: Diagnosis present

## 2019-07-22 DIAGNOSIS — Z79891 Long term (current) use of opiate analgesic: Secondary | ICD-10-CM

## 2019-07-22 DIAGNOSIS — M109 Gout, unspecified: Secondary | ICD-10-CM | POA: Diagnosis present

## 2019-07-22 DIAGNOSIS — Z862 Personal history of diseases of the blood and blood-forming organs and certain disorders involving the immune mechanism: Secondary | ICD-10-CM

## 2019-07-22 DIAGNOSIS — Z87311 Personal history of (healed) other pathological fracture: Secondary | ICD-10-CM

## 2019-07-22 DIAGNOSIS — Z599 Problem related to housing and economic circumstances, unspecified: Secondary | ICD-10-CM

## 2019-07-22 DIAGNOSIS — S72001A Fracture of unspecified part of neck of right femur, initial encounter for closed fracture: Secondary | ICD-10-CM | POA: Diagnosis present

## 2019-07-22 DIAGNOSIS — N183 Chronic kidney disease, stage 3 (moderate): Secondary | ICD-10-CM | POA: Diagnosis present

## 2019-07-22 DIAGNOSIS — Z90710 Acquired absence of both cervix and uterus: Secondary | ICD-10-CM

## 2019-07-22 DIAGNOSIS — I712 Thoracic aortic aneurysm, without rupture: Secondary | ICD-10-CM | POA: Diagnosis present

## 2019-07-22 DIAGNOSIS — F17210 Nicotine dependence, cigarettes, uncomplicated: Secondary | ICD-10-CM | POA: Diagnosis present

## 2019-07-22 DIAGNOSIS — R52 Pain, unspecified: Secondary | ICD-10-CM

## 2019-07-22 DIAGNOSIS — D72828 Other elevated white blood cell count: Secondary | ICD-10-CM | POA: Diagnosis present

## 2019-07-22 DIAGNOSIS — J449 Chronic obstructive pulmonary disease, unspecified: Secondary | ICD-10-CM | POA: Diagnosis present

## 2019-07-22 DIAGNOSIS — Z9981 Dependence on supplemental oxygen: Secondary | ICD-10-CM

## 2019-07-22 DIAGNOSIS — Z885 Allergy status to narcotic agent status: Secondary | ICD-10-CM

## 2019-07-22 DIAGNOSIS — Z66 Do not resuscitate: Secondary | ICD-10-CM | POA: Diagnosis present

## 2019-07-22 DIAGNOSIS — K5900 Constipation, unspecified: Secondary | ICD-10-CM | POA: Diagnosis not present

## 2019-07-22 DIAGNOSIS — Z886 Allergy status to analgesic agent status: Secondary | ICD-10-CM

## 2019-07-22 DIAGNOSIS — K219 Gastro-esophageal reflux disease without esophagitis: Secondary | ICD-10-CM | POA: Diagnosis present

## 2019-07-22 DIAGNOSIS — K08409 Partial loss of teeth, unspecified cause, unspecified class: Secondary | ICD-10-CM | POA: Diagnosis present

## 2019-07-22 DIAGNOSIS — Z9221 Personal history of antineoplastic chemotherapy: Secondary | ICD-10-CM

## 2019-07-22 DIAGNOSIS — I129 Hypertensive chronic kidney disease with stage 1 through stage 4 chronic kidney disease, or unspecified chronic kidney disease: Secondary | ICD-10-CM | POA: Diagnosis present

## 2019-07-22 DIAGNOSIS — Y92008 Other place in unspecified non-institutional (private) residence as the place of occurrence of the external cause: Secondary | ICD-10-CM

## 2019-07-22 DIAGNOSIS — Z419 Encounter for procedure for purposes other than remedying health state, unspecified: Secondary | ICD-10-CM

## 2019-07-22 DIAGNOSIS — I714 Abdominal aortic aneurysm, without rupture: Secondary | ICD-10-CM | POA: Diagnosis present

## 2019-07-22 DIAGNOSIS — Z79890 Hormone replacement therapy (postmenopausal): Secondary | ICD-10-CM

## 2019-07-22 DIAGNOSIS — F419 Anxiety disorder, unspecified: Secondary | ICD-10-CM | POA: Diagnosis present

## 2019-07-22 DIAGNOSIS — Z7983 Long term (current) use of bisphosphonates: Secondary | ICD-10-CM

## 2019-07-22 DIAGNOSIS — Z79899 Other long term (current) drug therapy: Secondary | ICD-10-CM

## 2019-07-22 DIAGNOSIS — Z91041 Radiographic dye allergy status: Secondary | ICD-10-CM

## 2019-07-22 DIAGNOSIS — M79651 Pain in right thigh: Secondary | ICD-10-CM

## 2019-07-22 DIAGNOSIS — I2510 Atherosclerotic heart disease of native coronary artery without angina pectoris: Secondary | ICD-10-CM | POA: Diagnosis present

## 2019-07-22 DIAGNOSIS — W19XXXA Unspecified fall, initial encounter: Secondary | ICD-10-CM

## 2019-07-22 DIAGNOSIS — S72141A Displaced intertrochanteric fracture of right femur, initial encounter for closed fracture: Secondary | ICD-10-CM | POA: Diagnosis not present

## 2019-07-22 DIAGNOSIS — E039 Hypothyroidism, unspecified: Secondary | ICD-10-CM | POA: Diagnosis present

## 2019-07-22 DIAGNOSIS — W010XXA Fall on same level from slipping, tripping and stumbling without subsequent striking against object, initial encounter: Secondary | ICD-10-CM | POA: Diagnosis present

## 2019-07-22 DIAGNOSIS — Z888 Allergy status to other drugs, medicaments and biological substances status: Secondary | ICD-10-CM

## 2019-07-22 DIAGNOSIS — Z20828 Contact with and (suspected) exposure to other viral communicable diseases: Secondary | ICD-10-CM | POA: Diagnosis present

## 2019-07-22 LAB — CBC WITH DIFFERENTIAL/PLATELET
Abs Immature Granulocytes: 2.11 10*3/uL — ABNORMAL HIGH (ref 0.00–0.07)
Basophils Absolute: 0.4 10*3/uL — ABNORMAL HIGH (ref 0.0–0.1)
Basophils Relative: 2 %
Eosinophils Absolute: 0.7 10*3/uL — ABNORMAL HIGH (ref 0.0–0.5)
Eosinophils Relative: 3 %
HCT: 40.6 % (ref 36.0–46.0)
Hemoglobin: 12.8 g/dL (ref 12.0–15.0)
Immature Granulocytes: 9 %
Lymphocytes Relative: 7 %
Lymphs Abs: 1.8 10*3/uL (ref 0.7–4.0)
MCH: 27.1 pg (ref 26.0–34.0)
MCHC: 31.5 g/dL (ref 30.0–36.0)
MCV: 86 fL (ref 80.0–100.0)
Monocytes Absolute: 1 10*3/uL (ref 0.1–1.0)
Monocytes Relative: 4 %
Neutro Abs: 18.1 10*3/uL — ABNORMAL HIGH (ref 1.7–7.7)
Neutrophils Relative %: 75 %
Platelets: 357 10*3/uL (ref 150–400)
RBC: 4.72 MIL/uL (ref 3.87–5.11)
RDW: 19.7 % — ABNORMAL HIGH (ref 11.5–15.5)
Smear Review: ADEQUATE
WBC: 23.9 10*3/uL — ABNORMAL HIGH (ref 4.0–10.5)
nRBC: 0.4 % — ABNORMAL HIGH (ref 0.0–0.2)

## 2019-07-22 LAB — BASIC METABOLIC PANEL
Anion gap: 9 (ref 5–15)
BUN: 14 mg/dL (ref 8–23)
CO2: 24 mmol/L (ref 22–32)
Calcium: 9.8 mg/dL (ref 8.9–10.3)
Chloride: 105 mmol/L (ref 98–111)
Creatinine, Ser: 0.87 mg/dL (ref 0.44–1.00)
GFR calc Af Amer: >60 mL/min (ref >60)
GFR calc non Af Amer: >60 mL/min (ref >60)
Glucose, Bld: 117 mg/dL — ABNORMAL HIGH (ref 70–99)
Potassium: 3.1 mmol/L — ABNORMAL LOW (ref 3.5–5.1)
Sodium: 138 mmol/L (ref 135–145)

## 2019-07-22 LAB — PROTIME-INR
INR: 1.1 (ref 0.8–1.2)
Prothrombin Time: 14.5 seconds (ref 11.4–15.2)

## 2019-07-22 MED ORDER — SODIUM CHLORIDE 0.9 % IV BOLUS
500.0000 mL | Freq: Once | INTRAVENOUS | Status: AC
  Administered 2019-07-22: 500 mL via INTRAVENOUS

## 2019-07-22 MED ORDER — HYDROMORPHONE HCL 1 MG/ML IJ SOLN
0.5000 mg | INTRAMUSCULAR | Status: DC
  Filled 2019-07-22: qty 1

## 2019-07-22 MED ORDER — HYDROMORPHONE HCL 1 MG/ML IJ SOLN
1.0000 mg | INTRAMUSCULAR | Status: AC
  Administered 2019-07-22: 1 mg via INTRAVENOUS

## 2019-07-22 MED ORDER — FENTANYL CITRATE (PF) 100 MCG/2ML IJ SOLN
50.0000 ug | Freq: Once | INTRAMUSCULAR | Status: AC
  Administered 2019-07-22: 50 ug via INTRAVENOUS
  Filled 2019-07-22: qty 2

## 2019-07-22 NOTE — ED Notes (Signed)
Patient states that she feels she is unable to walk. She states that every time she attempts to move her right leg she has a pain of '10'.

## 2019-07-22 NOTE — ED Triage Notes (Signed)
Patient has a witnessed fall today while trying to switch from one chair to another. She landed on her right hip. She has had recent lower back surgery. She received 39mcg of Fentanyl en route to the hospital.

## 2019-07-22 NOTE — ED Notes (Signed)
Patient to

## 2019-07-22 NOTE — ED Notes (Signed)
PT c/o pain, this RN informed MD

## 2019-07-22 NOTE — ED Notes (Signed)
Patient to X-ray

## 2019-07-22 NOTE — ED Provider Notes (Addendum)
[facility] Emergency Department Provider Note  ____________________________________________  Time seen: Approximately 8:22 PM  I have reviewed the triage vital signs and the nursing notes.   HISTORY  Chief Complaint Fall    HPI Veronica Hubbard is a 84 y.o. female with a history of anemia anxiety CKD COPD CAD GERD hypertension who was in her usual state of health when she was trying to move to a chair today on her porch and she fell to the ground landing on her right hip resulting in immediate sudden severe pain in the right hip, nonradiating, worse with movement, no alleviating factors.  Given 50 mcg of fentanyl prior to arrival in the ED by EMS.  She also hit the back of her head on the ground when she fell.  She denies neck pain headache vision changes paresthesias or weakness.      Past Medical History:  Diagnosis Date  . Anemia   . Anxiety   . Aortic anomaly    stenosis  . Asthma   . Cancer ([place]) 02/2018   bone marrow cancer  . Chronic kidney disease   . COPD (chronic obstructive pulmonary disease) ([place])   . Coronary artery disease   . Depression   . GERD (gastroesophageal reflux disease)   . Hypertension   . Hypothyroidism   . Personal history of chemotherapy   . Spinal stenosis      Patient Active Problem List   Diagnosis Date Noted  . MDS (myelodysplastic syndrome) ([place])   . History of compression fracture of spine   . Palliative care by specialist   . Severe back pain 05/20/2019  . Compression fracture of lumbar spine, non-traumatic ([place]) 05/11/2019  . Intractable pain 04/19/2019  . AAA (abdominal aortic aneurysm) without rupture ([place]) 04/09/2017  . Bilateral carotid artery stenosis 04/09/2017  . Thoracic aortic aneurysm without rupture ([place]) 04/09/2017  . Hyponatremia 02/23/2016  . Chest pain 02/23/2016  . Gastroenteritis due to norovirus 02/09/2016     Past Surgical History:  Procedure Laterality Date  . ABDOMINAL  HYSTERECTOMY    . BACK SURGERY    . BREAST BIOPSY Left 2003   neg  . cataracts    . KYPHOPLASTY N/A 04/22/2019   Procedure: KYPHOPLASTY;  Surgeon: Hessie Knows, MD;  Location: [facility] ORS;  Service: Orthopedics;  Laterality: N/A;  . KYPHOPLASTY N/A 04/10/2019   Procedure: KYPHOPLASTY L3;  Surgeon: Hessie Knows, MD;  Location: [facility] ORS;  Service: Orthopedics;  Laterality: N/A;  . KYPHOPLASTY N/A 05/13/2019   Procedure: KYPHOPLASTY L1 L2;  Surgeon: Hessie Knows, MD;  Location: [facility] ORS;  Service: Orthopedics;  Laterality: N/A;  . KYPHOPLASTY N/A 06/19/2019   Procedure: L5 KYPHOPLASTY;  Surgeon: Hessie Knows, MD;  Location: [facility] ORS;  Service: Orthopedics;  Laterality: N/A;  . KYPHOPLASTY N/A 07/04/2019   Procedure: T12 KYPHOPLASTY;  Surgeon: Hessie Knows, MD;  Location: [facility] ORS;  Service: Orthopedics;  Laterality: N/A;  . lipoma removal    . SHOULDER SURGERY       Prior to Admission medications   Medication Sig Start Date End Date Taking? Authorizing Provider  acetaminophen (TYLENOL) 325 MG tablet Take 2 tablets (650 mg total) by mouth every 6 (six) hours as needed for mild pain (or Fever >/= 101). 04/27/19   Mayo, Pete Pelt, MD  albuterol (PROVENTIL) (2.5 MG/3ML) 0.083% nebulizer solution Take 2.5 mg by nebulization every 6 (six) hours as needed for wheezing or shortness of breath.    [provider]  alendronate (  FOSAMAX) 70 MG tablet Take 70 mg by mouth every Monday. Take with a full glass of water on an empty stomach.    [provider]  allopurinol (ZYLOPRIM) 100 MG tablet Take 200 mg by mouth daily. 05/04/18   [provider]  azithromycin (ZITHROMAX) 250 MG tablet Take 250 mg by mouth every Monday, Wednesday, and Friday.    [provider]  Cholecalciferol (VITAMIN D3) 25 MCG (1000 UT) CAPS Take 1,000 Units by mouth daily.    [provider]  citalopram (CELEXA) 20 MG tablet Take 20 mg by mouth daily.    [provider]   cyclobenzaprine (FLEXERIL) 5 MG tablet Take 5 mg by mouth 3 (three) times daily.     [provider]  hydrALAZINE (APRESOLINE) 50 MG tablet Take 1 tablet (50 mg total) by mouth every 8 (eight) hours. 04/27/19   Mayo, Pete Pelt, MD  isosorbide mononitrate (IMDUR) 30 MG 24 hr tablet Take 30 mg by mouth daily.    [provider]  levothyroxine (SYNTHROID) 112 MCG tablet Take 112 mcg by mouth daily before breakfast.    [provider]  lidocaine (LIDODERM) 5 % Place 1 patch onto the skin every 12 (twelve) hours. Remove & Discard patch within 12 hours or as directed by MD 07/10/19 07/09/20  Merlyn Lot, MD  Melatonin 5 MG TABS Take 5 mg by mouth at bedtime.    [provider]  metoCLOPramide (REGLAN) 5 MG tablet Take 1 tablet (5 mg total) by mouth every 8 (eight) hours as needed for nausea or refractory nausea / vomiting. 05/26/19   Lang Snow, NP  metoprolol tartrate (LOPRESSOR) 25 MG tablet Take 1 tablet (25 mg total) by mouth 2 (two) times daily. 05/19/19   Vaughan Basta, MD  Omega-3 Fatty Acids (FISH OIL) 1000 MG CAPS Take 1,000 mg by mouth at bedtime.    [provider]  oxyCODONE-acetaminophen (PERCOCET) 5-325 MG tablet Take 1 tablet by mouth every 4 (four) hours as needed for severe pain. 07/04/19 07/03/20  Hessie Knows, MD  oxyCODONE-acetaminophen (PERCOCET) 7.5-325 MG tablet Take 1 tablet by mouth every 6 (six) hours as needed for moderate pain or severe pain. 05/19/19   Vaughan Basta, MD  oxyCODONE-acetaminophen (PERCOCET) 7.5-325 MG tablet Take 1 tablet by mouth every 4 (four) hours as needed for severe pain. 06/19/19 06/18/20  Hessie Knows, MD  pantoprazole (PROTONIX) 40 MG tablet Take 40 mg by mouth 2 (two) times daily.    [provider]  polyethylene glycol (MIRALAX / GLYCOLAX) 17 g packet Take 17 g by mouth daily as needed. Patient taking differently: Take 17 g by mouth daily as needed (constipation.).   04/27/19   Mayo, Pete Pelt, MD  sertraline (ZOLOFT) 25 MG tablet Take 1 tablet (25 mg total) by mouth daily. 05/20/19   Vaughan Basta, MD  TRELEGY ELLIPTA 100-62.5-25 MCG/INH AEPB Inhale 1 puff into the lungs at bedtime.    [provider]  triamcinolone cream (KENALOG) 0.5 % Apply 1 application topically 2 (two) times daily as needed (skin rash).     [provider]     Allergies Aspirin, Celecoxib, Iodinated diagnostic agents, Losartan, Morphine, and Verapamil   Family History  Problem Relation Age of Onset  . Leukemia Mother   . CAD Father   . Diabetes Father   . Breast cancer Sister 72    Social History Social History   Tobacco Use  . Smoking status: Current Every Day Smoker  Types: Cigarettes  . Smokeless tobacco: Never Used  Substance Use Topics  . Alcohol use: No  . Drug use: No    Review of Systems  Constitutional:   No fever or chills.  ENT:   No sore throat. No rhinorrhea. Cardiovascular:   No chest pain or syncope. Respiratory:   No dyspnea or cough. Gastrointestinal:   Negative for abdominal pain, vomiting and diarrhea.  Musculoskeletal: Positive for right hip pain All other systems reviewed and are negative except as documented above in ROS and HPI.  ____________________________________________   PHYSICAL EXAM:  VITAL SIGNS: ED Triage Vitals [07/22/19 2020]  Enc Vitals Group     BP      Pulse      Resp      Temp 98 F (36.7 C)     Temp src      SpO2      Weight      Height      Head Circumference      Peak Flow      Pain Score      Pain Loc      Pain Edu?      Excl. in [place]?     Vital signs reviewed, nursing assessments reviewed.   Constitutional:   Alert and oriented. Non-toxic appearance. Eyes:   Conjunctivae are normal. EOMI. PERRL. ENT      Head:   Normocephalic and atraumatic.  No lacerations          Neck:   No meningismus. Full ROM.  No midline spinal  tenderness Hematological/Lymphatic/Immunilogical:   No cervical lymphadenopathy. Cardiovascular:   RRR. Symmetric bilateral radial and DP pulses.  No murmurs. Cap refill less than 2 seconds. Respiratory:   Normal respiratory effort without tachypnea/retractions. Breath sounds are clear and equal bilaterally. No wheezes/rales/rhonchi. Gastrointestinal:   Soft and nontender. Non distended. There is no CVA tenderness.  No rebound, rigidity, or guarding.  Musculoskeletal:   Pronounced right upper thigh tenderness, worse with range of motion of the right hip.  No deformities or hematoma, compartments are soft.. Neurologic:   Normal speech and language.  Motor grossly intact. No acute focal neurologic deficits are appreciated.  Skin:    Skin is warm, dry and intact. No rash noted.  No petechiae, purpura, or bullae.  ____________________________________________    LABS (pertinent positives/negatives) (all labs ordered are listed, but only abnormal results are displayed) Labs Reviewed  BASIC METABOLIC PANEL - Abnormal; Notable for the following components:      Result Value   Potassium 3.1 (*)    Glucose, Bld 117 (*)    All other components within normal limits  CBC WITH DIFFERENTIAL/PLATELET - Abnormal; Notable for the following components:   WBC 23.9 (*)    RDW 19.7 (*)    nRBC 0.4 (*)    Neutro Abs 18.1 (*)    Eosinophils Absolute 0.7 (*)    Basophils Absolute 0.4 (*)    Abs Immature Granulocytes 2.11 (*)    All other components within normal limits  PROTIME-INR   ____________________________________________   EKG    ____________________________________________    RADIOLOGY  Dg Chest 1 View  Result Date: 07/22/2019 CLINICAL DATA:  Status post fall today EXAM: CHEST  1 VIEW COMPARISON:  May 02, 2019 FINDINGS: The heart size and mediastinal contours are stable. Aneurysm dilatation of the descending aorta is unchanged. Both lungs are clear. The visualized skeletal structures  are unremarkable. IMPRESSION: No active cardiopulmonary disease. Electronically Signed   By:  Abelardo Diesel M.D.   On: 07/22/2019 21:13   Ct Head Wo Contrast  Result Date: 07/22/2019 CLINICAL DATA:  84 year old female status post fall landing on right hip today. EXAM: CT HEAD WITHOUT CONTRAST CT CERVICAL SPINE WITHOUT CONTRAST TECHNIQUE: Multidetector CT imaging of the head and cervical spine was performed following the standard protocol without intravenous contrast. Multiplanar CT image reconstructions of the cervical spine were also generated. COMPARISON:  Head CT 05/25/2019. FINDINGS: CT HEAD FINDINGS Brain: Chronic lacunar infarcts along the right caudate, caudothalamic groove, and anterior external capsule. Small chronic left superior cerebellar infarct. Stable gray-white matter differentiation throughout the brain. No midline shift, ventriculomegaly, mass effect, evidence of mass lesion, intracranial hemorrhage or evidence of cortically based acute infarction. Vascular: Calcified atherosclerosis at the skull base. No suspicious intracranial vascular hyperdensity. Skull: Intact. Sinuses/Orbits: Visualized paranasal sinuses and mastoids are stable and well pneumatized. Other: No orbit or scalp soft tissue injury. CT CERVICAL SPINE FINDINGS Alignment: Straightening and mild reversal of cervical lordosis. Cervicothoracic junction alignment is within normal limits. Bilateral posterior element alignment is within normal limits. Skull base and vertebrae: Visualized skull base is intact. No atlanto-occipital dissociation. No acute osseous abnormality identified. Soft tissues and spinal canal: No prevertebral fluid or swelling. No visible canal hematoma. Bulky bilateral calcified carotid artery atherosclerosis. Partially retropharyngeal course of the left carotid. Disc levels: Widespread cervical disc, endplate, and facet degeneration. Up to mild degenerative spinal stenosis. Upper chest: Visible upper thoracic  levels appear intact. Negative lung apices. Calcified proximal great vessels. IMPRESSION: 1. No acute traumatic injury identified in the head or cervical spine. 2. Stable non contrast CT appearance of the brain with chronic small vessel ischemia. 3. Widespread cervical spine degeneration. 4. Calcified atherosclerosis of the proximal great vessels and both carotids. Electronically Signed   By: Genevie Ann M.D.   On: 07/22/2019 21:10   Ct Cervical Spine Wo Contrast  Result Date: 07/22/2019 CLINICAL DATA:  84 year old female status post fall landing on right hip today. EXAM: CT HEAD WITHOUT CONTRAST CT CERVICAL SPINE WITHOUT CONTRAST TECHNIQUE: Multidetector CT imaging of the head and cervical spine was performed following the standard protocol without intravenous contrast. Multiplanar CT image reconstructions of the cervical spine were also generated. COMPARISON:  Head CT 05/25/2019. FINDINGS: CT HEAD FINDINGS Brain: Chronic lacunar infarcts along the right caudate, caudothalamic groove, and anterior external capsule. Small chronic left superior cerebellar infarct. Stable gray-white matter differentiation throughout the brain. No midline shift, ventriculomegaly, mass effect, evidence of mass lesion, intracranial hemorrhage or evidence of cortically based acute infarction. Vascular: Calcified atherosclerosis at the skull base. No suspicious intracranial vascular hyperdensity. Skull: Intact. Sinuses/Orbits: Visualized paranasal sinuses and mastoids are stable and well pneumatized. Other: No orbit or scalp soft tissue injury. CT CERVICAL SPINE FINDINGS Alignment: Straightening and mild reversal of cervical lordosis. Cervicothoracic junction alignment is within normal limits. Bilateral posterior element alignment is within normal limits. Skull base and vertebrae: Visualized skull base is intact. No atlanto-occipital dissociation. No acute osseous abnormality identified. Soft tissues and spinal canal: No prevertebral fluid  or swelling. No visible canal hematoma. Bulky bilateral calcified carotid artery atherosclerosis. Partially retropharyngeal course of the left carotid. Disc levels: Widespread cervical disc, endplate, and facet degeneration. Up to mild degenerative spinal stenosis. Upper chest: Visible upper thoracic levels appear intact. Negative lung apices. Calcified proximal great vessels. IMPRESSION: 1. No acute traumatic injury identified in the head or cervical spine. 2. Stable non contrast CT appearance of the brain with chronic small vessel ischemia.  3. Widespread cervical spine degeneration. 4. Calcified atherosclerosis of the proximal great vessels and both carotids. Electronically Signed   By: Genevie Ann M.D.   On: 07/22/2019 21:10   Dg Foot Complete Right  Result Date: 07/22/2019 CLINICAL DATA:  Status post fall with right foot pain. EXAM: RIGHT FOOT COMPLETE - 3+ VIEW COMPARISON:  None. FINDINGS: There is no evidence of fracture or dislocation. Plantar calcaneal spur is noted. Soft tissues are unremarkable. IMPRESSION: No acute fracture or dislocation. Electronically Signed   By: Abelardo Diesel M.D.   On: 07/22/2019 21:14   Dg Hip Unilat W Or Wo Pelvis 2-3 Views Right  Result Date: 07/22/2019 CLINICAL DATA:  84 year old female status post fall with right hip pain. EXAM: DG HIP (WITH OR WITHOUT PELVIS) 2-3V RIGHT COMPARISON:  CT Abdomen and Pelvis 05/25/2019 and earlier. FINDINGS: Partially visible multilevel thoracic vertebral augmentation. Femoral heads remain normally located. Asymmetric right hip joint space loss redemonstrated. The pelvis and SI joints appear stable and intact. Grossly intact proximal left femur. The proximal right femur appears intact. IMPRESSION: No acute fracture or dislocation identified about the right hip or pelvis. Electronically Signed   By: Genevie Ann M.D.   On: 07/22/2019 21:12     ____________________________________________   PROCEDURES Procedures  ____________________________________________  DIFFERENTIAL DIAGNOSIS   Subdural hematoma, C-spine fracture, right hip fracture  CLINICAL IMPRESSION / ASSESSMENT AND PLAN / ED COURSE  Medications ordered in the ED: Medications  fentaNYL (SUBLIMAZE) injection 50 mcg (50 mcg Intravenous Given 07/22/19 2028)  sodium chloride 0.9 % bolus 500 mL (500 mLs Intravenous New Bag/Given 07/22/19 2120)  HYDROmorphone (DILAUDID) injection 1 mg (1 mg Intravenous Given 07/22/19 2252)    Pertinent labs & imaging results that were available during my care of the patient were reviewed by me and considered in my medical decision making (see chart for details).  Veronica Hubbard was evaluated in Emergency Department on 07/22/2019 for the symptoms described in the history of present illness. She was evaluated in the context of the global COVID-19 pandemic, which necessitated consideration that the patient might be at risk for infection with the SARS-CoV-2 virus that causes COVID-19. Institutional protocols and algorithms that pertain to the evaluation of patients at risk for COVID-19 are in a state of rapid change based on information released by regulatory bodies including the CDC and federal and state organizations. These policies and algorithms were followed during the patient's care in the ED.   Patient presents with a mechanical fall, right hip pain.  Also hit her head, due to her age and comorbidities and potential distracting injury from a suspected hip fracture, will need to get CT head and neck to rule out concurrent traumatic injury to the head or C-spine.  Check x-rays of the chest and right hip.  Clinical Course as of Jul 21 2314  Tue Jul 22, 2019  2124 Work-up unremarkable.  Will try ambulation and if pain is controlled patient is stable for discharge home for outpatient follow-up of thigh contusion..   [PS]  2250  Patient unable to get up out of bed due to persistent 10 out of 10 pain.  I will get a CT of the femur to evaluate for an occult fracture that was not visible on x-ray.  Still having severe pain so give 1 mg IV Dilaudid.   [PS]    Clinical Course User Index [PS] Carrie Mew, MD      ----------------------------------------- 11:46 PM on 07/22/2019 -----------------------------------------  Still  persistent severe pain despite repeated doses of opioids.  Awaiting CT read.  Will need to hospitalize overnight for further pain control and PT evaluation in the morning.  Discussed with hospitalist for further management. ____________________________________________   FINAL CLINICAL IMPRESSION(S) / ED DIAGNOSES    Final diagnoses:  Right thigh pain  Accident due to mechanical fall without injury, initial encounter     ED Discharge Orders    None      Portions of this note were generated with dragon dictation software. Dictation errors may occur despite best attempts at proofreading.   Carrie Mew, MD 07/22/19 2315    Carrie Mew, MD 07/22/19 [id number]

## 2019-07-23 ENCOUNTER — Encounter
Admission: EM | Disposition: A | Discharge status: Home Health | Payer: Self-pay | Source: Home / Self Care | Attending: Specialist

## 2019-07-23 ENCOUNTER — Inpatient Hospital Stay: Payer: Medicare HMO

## 2019-07-23 ENCOUNTER — Inpatient Hospital Stay: Payer: Medicare HMO | Admitting: Anesthesiology

## 2019-07-23 ENCOUNTER — Encounter: Payer: Self-pay | Admitting: *Deleted

## 2019-07-23 ENCOUNTER — Other Ambulatory Visit: Payer: Self-pay

## 2019-07-23 DIAGNOSIS — E039 Hypothyroidism, unspecified: Secondary | ICD-10-CM | POA: Diagnosis present

## 2019-07-23 DIAGNOSIS — M4800 Spinal stenosis, site unspecified: Secondary | ICD-10-CM | POA: Diagnosis present

## 2019-07-23 DIAGNOSIS — I2510 Atherosclerotic heart disease of native coronary artery without angina pectoris: Secondary | ICD-10-CM | POA: Diagnosis present

## 2019-07-23 DIAGNOSIS — J449 Chronic obstructive pulmonary disease, unspecified: Secondary | ICD-10-CM | POA: Diagnosis present

## 2019-07-23 DIAGNOSIS — I714 Abdominal aortic aneurysm, without rupture: Secondary | ICD-10-CM | POA: Diagnosis present

## 2019-07-23 DIAGNOSIS — S72141A Displaced intertrochanteric fracture of right femur, initial encounter for closed fracture: Secondary | ICD-10-CM | POA: Diagnosis present

## 2019-07-23 DIAGNOSIS — Y92008 Other place in unspecified non-institutional (private) residence as the place of occurrence of the external cause: Secondary | ICD-10-CM | POA: Diagnosis not present

## 2019-07-23 DIAGNOSIS — Z66 Do not resuscitate: Secondary | ICD-10-CM | POA: Diagnosis present

## 2019-07-23 DIAGNOSIS — Z599 Problem related to housing and economic circumstances, unspecified: Secondary | ICD-10-CM | POA: Diagnosis not present

## 2019-07-23 DIAGNOSIS — I350 Nonrheumatic aortic (valve) stenosis: Secondary | ICD-10-CM | POA: Diagnosis present

## 2019-07-23 DIAGNOSIS — W010XXA Fall on same level from slipping, tripping and stumbling without subsequent striking against object, initial encounter: Secondary | ICD-10-CM | POA: Diagnosis present

## 2019-07-23 DIAGNOSIS — M109 Gout, unspecified: Secondary | ICD-10-CM | POA: Diagnosis present

## 2019-07-23 DIAGNOSIS — F419 Anxiety disorder, unspecified: Secondary | ICD-10-CM | POA: Diagnosis present

## 2019-07-23 DIAGNOSIS — I129 Hypertensive chronic kidney disease with stage 1 through stage 4 chronic kidney disease, or unspecified chronic kidney disease: Secondary | ICD-10-CM | POA: Diagnosis present

## 2019-07-23 DIAGNOSIS — I712 Thoracic aortic aneurysm, without rupture: Secondary | ICD-10-CM | POA: Diagnosis present

## 2019-07-23 DIAGNOSIS — F17210 Nicotine dependence, cigarettes, uncomplicated: Secondary | ICD-10-CM | POA: Diagnosis present

## 2019-07-23 DIAGNOSIS — Z9981 Dependence on supplemental oxygen: Secondary | ICD-10-CM | POA: Diagnosis not present

## 2019-07-23 DIAGNOSIS — F329 Major depressive disorder, single episode, unspecified: Secondary | ICD-10-CM | POA: Diagnosis present

## 2019-07-23 DIAGNOSIS — M79651 Pain in right thigh: Secondary | ICD-10-CM | POA: Diagnosis present

## 2019-07-23 DIAGNOSIS — K5900 Constipation, unspecified: Secondary | ICD-10-CM | POA: Diagnosis not present

## 2019-07-23 DIAGNOSIS — N183 Chronic kidney disease, stage 3 (moderate): Secondary | ICD-10-CM | POA: Diagnosis present

## 2019-07-23 DIAGNOSIS — K219 Gastro-esophageal reflux disease without esophagitis: Secondary | ICD-10-CM | POA: Diagnosis present

## 2019-07-23 DIAGNOSIS — E876 Hypokalemia: Secondary | ICD-10-CM | POA: Diagnosis present

## 2019-07-23 DIAGNOSIS — S72001A Fracture of unspecified part of neck of right femur, initial encounter for closed fracture: Secondary | ICD-10-CM | POA: Diagnosis present

## 2019-07-23 DIAGNOSIS — K08409 Partial loss of teeth, unspecified cause, unspecified class: Secondary | ICD-10-CM | POA: Diagnosis present

## 2019-07-23 DIAGNOSIS — Z20828 Contact with and (suspected) exposure to other viral communicable diseases: Secondary | ICD-10-CM | POA: Diagnosis present

## 2019-07-23 DIAGNOSIS — D72828 Other elevated white blood cell count: Secondary | ICD-10-CM | POA: Diagnosis present

## 2019-07-23 HISTORY — PX: INTRAMEDULLARY (IM) NAIL INTERTROCHANTERIC: SHX5875

## 2019-07-23 LAB — MRSA PCR SCREENING: MRSA by PCR: POSITIVE — AB

## 2019-07-23 LAB — CBC WITH DIFFERENTIAL/PLATELET
Abs Immature Granulocytes: 1.75 10*3/uL — ABNORMAL HIGH (ref 0.00–0.07)
Basophils Absolute: 0.4 10*3/uL — ABNORMAL HIGH (ref 0.0–0.1)
Basophils Relative: 2 %
Eosinophils Absolute: 0.7 10*3/uL — ABNORMAL HIGH (ref 0.0–0.5)
Eosinophils Relative: 3 %
HCT: 39.7 % (ref 36.0–46.0)
Hemoglobin: 12.7 g/dL (ref 12.0–15.0)
Immature Granulocytes: 7 %
Lymphocytes Relative: 7 %
Lymphs Abs: 1.8 10*3/uL (ref 0.7–4.0)
MCH: 27.3 pg (ref 26.0–34.0)
MCHC: 32 g/dL (ref 30.0–36.0)
MCV: 85.4 fL (ref 80.0–100.0)
Monocytes Absolute: 1 10*3/uL (ref 0.1–1.0)
Monocytes Relative: 4 %
Neutro Abs: 18.9 10*3/uL — ABNORMAL HIGH (ref 1.7–7.7)
Neutrophils Relative %: 77 %
Platelets: 338 10*3/uL (ref 150–400)
RBC: 4.65 MIL/uL (ref 3.87–5.11)
RDW: 19.5 % — ABNORMAL HIGH (ref 11.5–15.5)
Smear Review: NORMAL
WBC: 24.5 10*3/uL — ABNORMAL HIGH (ref 4.0–10.5)
nRBC: 0.2 % (ref 0.0–0.2)

## 2019-07-23 LAB — BASIC METABOLIC PANEL
Anion gap: 8 (ref 5–15)
BUN: 12 mg/dL (ref 8–23)
CO2: 26 mmol/L (ref 22–32)
Calcium: 9.6 mg/dL (ref 8.9–10.3)
Chloride: 106 mmol/L (ref 98–111)
Creatinine, Ser: 0.79 mg/dL (ref 0.44–1.00)
GFR calc Af Amer: >60 mL/min (ref >60)
GFR calc non Af Amer: >60 mL/min (ref >60)
Glucose, Bld: 104 mg/dL — ABNORMAL HIGH (ref 70–99)
Potassium: 2.8 mmol/L — ABNORMAL LOW (ref 3.5–5.1)
Sodium: 140 mmol/L (ref 135–145)

## 2019-07-23 LAB — URINALYSIS, COMPLETE (UACMP) WITH MICROSCOPIC
Bacteria, UA: NONE SEEN
Bilirubin Urine: NEGATIVE
Glucose, UA: NEGATIVE mg/dL
Ketones, ur: NEGATIVE mg/dL
Nitrite: NEGATIVE
Protein, ur: NEGATIVE mg/dL
Specific Gravity, Urine: 1.015 (ref 1.005–1.030)
Squamous Epithelial / HPF: NONE SEEN (ref 0–5)
WBC, UA: >50 WBC/hpf — ABNORMAL HIGH (ref 0–5)
pH: 7 (ref 5.0–8.0)

## 2019-07-23 LAB — SARS CORONAVIRUS 2 BY RT PCR (HOSPITAL ORDER, PERFORMED IN ~~LOC~~ HOSPITAL LAB): SARS Coronavirus 2: NEGATIVE

## 2019-07-23 LAB — MAGNESIUM: Magnesium: 1.9 mg/dL (ref 1.7–2.4)

## 2019-07-23 LAB — POTASSIUM: Potassium: 3.3 mmol/L — ABNORMAL LOW (ref 3.5–5.1)

## 2019-07-23 SURGERY — FIXATION, FRACTURE, INTERTROCHANTERIC, WITH INTRAMEDULLARY ROD
Anesthesia: General | Laterality: Right

## 2019-07-23 MED ORDER — METOCLOPRAMIDE HCL 10 MG PO TABS
5.0000 mg | ORAL_TABLET | Freq: Three times a day (TID) | ORAL | Status: DC | PRN
  Administered 2019-07-26: 5 mg via ORAL
  Filled 2019-07-23: qty 1

## 2019-07-23 MED ORDER — ENOXAPARIN SODIUM 40 MG/0.4ML ~~LOC~~ SOLN
40.0000 mg | SUBCUTANEOUS | Status: DC
  Administered 2019-07-24 – 2019-07-29 (×6): 40 mg via SUBCUTANEOUS
  Filled 2019-07-23 (×6): qty 0.4

## 2019-07-23 MED ORDER — FENTANYL CITRATE (PF) 100 MCG/2ML IJ SOLN
INTRAMUSCULAR | Status: DC | PRN
  Administered 2019-07-23 (×3): 25 ug via INTRAVENOUS

## 2019-07-23 MED ORDER — BUPIVACAINE HCL (PF) 0.5 % IJ SOLN
INTRAMUSCULAR | Status: DC | PRN
  Administered 2019-07-23: 3 mL

## 2019-07-23 MED ORDER — POTASSIUM CHLORIDE IN NACL 20-0.9 MEQ/L-% IV SOLN
INTRAVENOUS | Status: DC
  Administered 2019-07-23 – 2019-07-24 (×2): via INTRAVENOUS
  Filled 2019-07-23 (×9): qty 1000

## 2019-07-23 MED ORDER — DOCUSATE SODIUM 100 MG PO CAPS
100.0000 mg | ORAL_CAPSULE | Freq: Two times a day (BID) | ORAL | Status: DC
  Administered 2019-07-23 – 2019-07-29 (×11): 100 mg via ORAL
  Filled 2019-07-23 (×11): qty 1

## 2019-07-23 MED ORDER — ONDANSETRON HCL 4 MG/2ML IJ SOLN
INTRAMUSCULAR | Status: DC | PRN
  Administered 2019-07-23: 4 mg via INTRAVENOUS

## 2019-07-23 MED ORDER — METOPROLOL TARTRATE 25 MG PO TABS
25.0000 mg | ORAL_TABLET | Freq: Two times a day (BID) | ORAL | Status: DC
  Administered 2019-07-23 – 2019-07-29 (×12): 25 mg via ORAL
  Filled 2019-07-23 (×13): qty 1

## 2019-07-23 MED ORDER — CEFAZOLIN SODIUM-DEXTROSE 1-4 GM/50ML-% IV SOLN
1.0000 g | Freq: Four times a day (QID) | INTRAVENOUS | Status: AC
  Administered 2019-07-23 – 2019-07-24 (×3): 1 g via INTRAVENOUS
  Filled 2019-07-23 (×3): qty 50

## 2019-07-23 MED ORDER — AZITHROMYCIN 250 MG PO TABS
250.0000 mg | ORAL_TABLET | ORAL | Status: DC
  Administered 2019-07-25 – 2019-07-28 (×2): 250 mg via ORAL
  Filled 2019-07-23 (×5): qty 1

## 2019-07-23 MED ORDER — KETAMINE HCL 50 MG/ML IJ SOLN
INTRAMUSCULAR | Status: AC
  Filled 2019-07-23: qty 10

## 2019-07-23 MED ORDER — ALBUTEROL SULFATE (2.5 MG/3ML) 0.083% IN NEBU: 2.5000 mg | INHALATION_SOLUTION | Freq: Four times a day (QID) | RESPIRATORY_TRACT | Status: DC | PRN

## 2019-07-23 MED ORDER — LIDOCAINE 5 % EX PTCH
1.0000 | MEDICATED_PATCH | Freq: Two times a day (BID) | CUTANEOUS | Status: DC
  Administered 2019-07-23 – 2019-07-29 (×12): 1 via TRANSDERMAL
  Filled 2019-07-23 (×15): qty 1

## 2019-07-23 MED ORDER — LIDOCAINE HCL (PF) 2 % IJ SOLN
INTRAMUSCULAR | Status: AC
  Filled 2019-07-23: qty 10

## 2019-07-23 MED ORDER — FENTANYL CITRATE (PF) 100 MCG/2ML IJ SOLN: 25.0000 ug | INTRAMUSCULAR | Status: DC | PRN

## 2019-07-23 MED ORDER — ROCURONIUM BROMIDE 50 MG/5ML IV SOLN
INTRAVENOUS | Status: AC
  Filled 2019-07-23: qty 1

## 2019-07-23 MED ORDER — TRANEXAMIC ACID 1000 MG/10ML IV SOLN
INTRAVENOUS | Status: AC
  Filled 2019-07-23: qty 10

## 2019-07-23 MED ORDER — HYDROCODONE-ACETAMINOPHEN 5-325 MG PO TABS
1.0000 | ORAL_TABLET | Freq: Four times a day (QID) | ORAL | Status: DC | PRN
  Administered 2019-07-23: 20:00:00 2 via ORAL
  Administered 2019-07-24 – 2019-07-28 (×7): 1 via ORAL
  Filled 2019-07-23 (×2): qty 2
  Filled 2019-07-23 (×6): qty 1

## 2019-07-23 MED ORDER — POTASSIUM CHLORIDE CRYS ER 20 MEQ PO TBCR
40.0000 meq | EXTENDED_RELEASE_TABLET | Freq: Once | ORAL | Status: AC
  Administered 2019-07-23: 40 meq via ORAL
  Filled 2019-07-23: qty 2

## 2019-07-23 MED ORDER — VITAMIN D 25 MCG (1000 UNIT) PO TABS
1000.0000 [IU] | ORAL_TABLET | Freq: Every day | ORAL | Status: DC
  Administered 2019-07-24 – 2019-07-29 (×6): 1000 [IU] via ORAL
  Filled 2019-07-23 (×6): qty 1

## 2019-07-23 MED ORDER — ONDANSETRON HCL 4 MG PO TABS
4.0000 mg | ORAL_TABLET | Freq: Four times a day (QID) | ORAL | Status: DC | PRN
  Administered 2019-07-24 – 2019-07-27 (×5): 4 mg via ORAL
  Filled 2019-07-23 (×5): qty 1

## 2019-07-23 MED ORDER — LACTATED RINGERS IV SOLN: INTRAVENOUS | Status: DC

## 2019-07-23 MED ORDER — UMECLIDINIUM BROMIDE 62.5 MCG/INH IN AEPB
1.0000 | INHALATION_SPRAY | Freq: Every day | RESPIRATORY_TRACT | Status: DC
  Administered 2019-07-23 – 2019-07-28 (×6): 1 via RESPIRATORY_TRACT
  Filled 2019-07-23 (×2): qty 7

## 2019-07-23 MED ORDER — MELATONIN 5 MG PO TABS
5.0000 mg | ORAL_TABLET | Freq: Every day | ORAL | Status: DC
  Administered 2019-07-23 – 2019-07-27 (×4): 5 mg via ORAL
  Filled 2019-07-23 (×8): qty 1

## 2019-07-23 MED ORDER — KETAMINE HCL 10 MG/ML IJ SOLN
INTRAMUSCULAR | Status: DC | PRN
  Administered 2019-07-23: 20 mg via INTRAVENOUS
  Administered 2019-07-23 (×2): 30 mg via INTRAVENOUS

## 2019-07-23 MED ORDER — CITALOPRAM HYDROBROMIDE 20 MG PO TABS
20.0000 mg | ORAL_TABLET | Freq: Every day | ORAL | Status: DC
  Administered 2019-07-24 – 2019-07-29 (×6): 20 mg via ORAL
  Filled 2019-07-23 (×5): qty 1

## 2019-07-23 MED ORDER — PHENYLEPHRINE HCL (PRESSORS) 10 MG/ML IV SOLN
INTRAVENOUS | Status: DC | PRN
  Administered 2019-07-23: 100 ug via INTRAVENOUS
  Administered 2019-07-23: 150 ug via INTRAVENOUS
  Administered 2019-07-23 (×2): 50 ug via INTRAVENOUS

## 2019-07-23 MED ORDER — FLUTICASONE-UMECLIDIN-VILANT 100-62.5-25 MCG/INH IN AEPB: 1.0000 | INHALATION_SPRAY | Freq: Every day | RESPIRATORY_TRACT | Status: DC

## 2019-07-23 MED ORDER — ISOSORBIDE MONONITRATE ER 30 MG PO TB24
30.0000 mg | ORAL_TABLET | Freq: Every day | ORAL | Status: DC
  Administered 2019-07-24 – 2019-07-29 (×6): 30 mg via ORAL
  Filled 2019-07-23 (×6): qty 1

## 2019-07-23 MED ORDER — SERTRALINE HCL 50 MG PO TABS: 25.0000 mg | ORAL_TABLET | Freq: Every day | ORAL | Status: DC

## 2019-07-23 MED ORDER — BUPIVACAINE-EPINEPHRINE (PF) 0.25% -1:200000 IJ SOLN
INTRAMUSCULAR | Status: AC
  Filled 2019-07-23: qty 30

## 2019-07-23 MED ORDER — FENTANYL CITRATE (PF) 100 MCG/2ML IJ SOLN
INTRAMUSCULAR | Status: AC
  Filled 2019-07-23: qty 2

## 2019-07-23 MED ORDER — PROPOFOL 500 MG/50ML IV EMUL
INTRAVENOUS | Status: DC | PRN
  Administered 2019-07-23: 50 ug/kg/min via INTRAVENOUS

## 2019-07-23 MED ORDER — ONDANSETRON HCL 4 MG/2ML IJ SOLN
INTRAMUSCULAR | Status: AC
  Filled 2019-07-23: qty 2

## 2019-07-23 MED ORDER — CEFAZOLIN SODIUM 1 G IJ SOLR
INTRAMUSCULAR | Status: AC
  Filled 2019-07-23: qty 20

## 2019-07-23 MED ORDER — ALLOPURINOL 100 MG PO TABS
200.0000 mg | ORAL_TABLET | Freq: Every day | ORAL | Status: DC
  Administered 2019-07-24 – 2019-07-29 (×6): 200 mg via ORAL
  Filled 2019-07-23 (×7): qty 2

## 2019-07-23 MED ORDER — MUPIROCIN 2 % EX OINT
1.0000 "application " | TOPICAL_OINTMENT | Freq: Two times a day (BID) | CUTANEOUS | Status: AC
  Administered 2019-07-23 – 2019-07-27 (×9): 1 via NASAL
  Filled 2019-07-23 (×2): qty 22

## 2019-07-23 MED ORDER — CHLORHEXIDINE GLUCONATE CLOTH 2 % EX PADS
6.0000 | MEDICATED_PAD | Freq: Every day | CUTANEOUS | Status: AC
  Administered 2019-07-25 – 2019-07-27 (×3): 6 via TOPICAL

## 2019-07-23 MED ORDER — POLYETHYLENE GLYCOL 3350 17 G PO PACK
17.0000 g | PACK | Freq: Every day | ORAL | Status: DC | PRN
  Filled 2019-07-23: qty 1

## 2019-07-23 MED ORDER — HYDRALAZINE HCL 50 MG PO TABS
50.0000 mg | ORAL_TABLET | Freq: Three times a day (TID) | ORAL | Status: DC
  Administered 2019-07-23 – 2019-07-29 (×14): 50 mg via ORAL
  Filled 2019-07-23 (×16): qty 1

## 2019-07-23 MED ORDER — MAGNESIUM SULFATE 2 GM/50ML IV SOLN
2.0000 g | INTRAVENOUS | Status: AC
  Administered 2019-07-23: 08:00:00 2 g via INTRAVENOUS
  Filled 2019-07-23: qty 50

## 2019-07-23 MED ORDER — PROPOFOL 10 MG/ML IV BOLUS
INTRAVENOUS | Status: DC | PRN
  Administered 2019-07-23: 30 mg via INTRAVENOUS

## 2019-07-23 MED ORDER — DEXAMETHASONE SODIUM PHOSPHATE 10 MG/ML IJ SOLN
INTRAMUSCULAR | Status: AC
  Filled 2019-07-23: qty 1

## 2019-07-23 MED ORDER — METOCLOPRAMIDE HCL 5 MG/ML IJ SOLN
5.0000 mg | Freq: Three times a day (TID) | INTRAMUSCULAR | Status: DC | PRN
  Administered 2019-07-26: 5 mg via INTRAVENOUS
  Filled 2019-07-23: qty 2

## 2019-07-23 MED ORDER — ONDANSETRON HCL 4 MG/2ML IJ SOLN
4.0000 mg | Freq: Four times a day (QID) | INTRAMUSCULAR | Status: DC | PRN
  Administered 2019-07-25: 4 mg via INTRAVENOUS
  Filled 2019-07-23 (×2): qty 2

## 2019-07-23 MED ORDER — LEVOTHYROXINE SODIUM 112 MCG PO TABS
112.0000 ug | ORAL_TABLET | Freq: Every day | ORAL | Status: DC
  Administered 2019-07-23: 06:00:00 112 ug via ORAL
  Filled 2019-07-23: qty 1

## 2019-07-23 MED ORDER — TRIAMCINOLONE ACETONIDE 0.5 % EX CREA
1.0000 "application " | TOPICAL_CREAM | Freq: Two times a day (BID) | CUTANEOUS | Status: DC | PRN
  Filled 2019-07-23: qty 15

## 2019-07-23 MED ORDER — EPHEDRINE SULFATE 50 MG/ML IJ SOLN
INTRAMUSCULAR | Status: AC
  Filled 2019-07-23: qty 1

## 2019-07-23 MED ORDER — ACETAMINOPHEN 325 MG PO TABS
650.0000 mg | ORAL_TABLET | Freq: Four times a day (QID) | ORAL | Status: DC | PRN
  Administered 2019-07-24 – 2019-07-28 (×5): 650 mg via ORAL
  Filled 2019-07-23 (×5): qty 2

## 2019-07-23 MED ORDER — CYCLOBENZAPRINE HCL 10 MG PO TABS
5.0000 mg | ORAL_TABLET | Freq: Three times a day (TID) | ORAL | Status: DC
  Administered 2019-07-23 – 2019-07-29 (×16): 5 mg via ORAL
  Filled 2019-07-23 (×16): qty 1

## 2019-07-23 MED ORDER — FLUTICASONE FUROATE-VILANTEROL 100-25 MCG/INH IN AEPB
1.0000 | INHALATION_SPRAY | Freq: Every day | RESPIRATORY_TRACT | Status: DC
  Administered 2019-07-23 – 2019-07-28 (×6): 1 via RESPIRATORY_TRACT
  Filled 2019-07-23 (×2): qty 28

## 2019-07-23 MED ORDER — METOCLOPRAMIDE HCL 10 MG PO TABS: 5.0000 mg | ORAL_TABLET | Freq: Three times a day (TID) | ORAL | Status: DC | PRN

## 2019-07-23 MED ORDER — CEFAZOLIN SODIUM-DEXTROSE 2-4 GM/100ML-% IV SOLN
2.0000 g | INTRAVENOUS | Status: AC
  Administered 2019-07-23: 2 g via INTRAVENOUS

## 2019-07-23 MED ORDER — LEVOTHYROXINE SODIUM 50 MCG PO TABS
150.0000 ug | ORAL_TABLET | Freq: Every day | ORAL | Status: DC
  Administered 2019-07-24 – 2019-07-29 (×5): 150 ug via ORAL
  Filled 2019-07-23 (×7): qty 1

## 2019-07-23 MED ORDER — ALENDRONATE SODIUM 70 MG PO TABS: 70.0000 mg | ORAL_TABLET | ORAL | Status: DC

## 2019-07-23 MED ORDER — MORPHINE SULFATE (PF) 2 MG/ML IV SOLN
0.5000 mg | INTRAVENOUS | Status: DC | PRN
  Administered 2019-07-23 – 2019-07-24 (×4): 0.5 mg via INTRAVENOUS
  Filled 2019-07-23 (×4): qty 1

## 2019-07-23 MED ORDER — GLYCOPYRROLATE 0.2 MG/ML IJ SOLN
INTRAMUSCULAR | Status: AC
  Filled 2019-07-23: qty 1

## 2019-07-23 MED ORDER — TRANEXAMIC ACID-NACL 1000-0.7 MG/100ML-% IV SOLN
INTRAVENOUS | Status: DC | PRN
  Administered 2019-07-23: 1000 mg via INTRAVENOUS

## 2019-07-23 MED ORDER — PROPOFOL 10 MG/ML IV BOLUS
INTRAVENOUS | Status: AC
  Filled 2019-07-23: qty 20

## 2019-07-23 MED ORDER — BUPIVACAINE-EPINEPHRINE (PF) 0.25% -1:200000 IJ SOLN
INTRAMUSCULAR | Status: DC | PRN
  Administered 2019-07-23: 10 mL

## 2019-07-23 MED ORDER — EPHEDRINE SULFATE 50 MG/ML IJ SOLN
INTRAMUSCULAR | Status: DC | PRN
  Administered 2019-07-23 (×2): 10 mg via INTRAVENOUS
  Administered 2019-07-23 (×2): 5 mg via INTRAVENOUS

## 2019-07-23 MED ORDER — GLYCOPYRROLATE 0.2 MG/ML IJ SOLN
INTRAMUSCULAR | Status: DC | PRN
  Administered 2019-07-23: 0.1 mg via INTRAVENOUS

## 2019-07-23 MED ORDER — LACTATED RINGERS IV SOLN
INTRAVENOUS | Status: DC
  Administered 2019-07-23: 14:00:00 via INTRAVENOUS

## 2019-07-23 MED ORDER — TRANEXAMIC ACID 1000 MG/10ML IV SOLN
2000.0000 mg | Freq: Once | INTRAVENOUS | Status: DC
  Filled 2019-07-23: qty 20

## 2019-07-23 SURGICAL SUPPLY — 41 items
BIT DRILL CALIBRATED 4.2 (BIT) ×2 IMPLANT
BIT DRILL SHORT 4.2 (BIT) ×1 IMPLANT
BLADE TFNA HELICAL 90 STERILE (Anchor) ×3 IMPLANT
BNDG COHESIVE 4X5 TAN STRL (GAUZE/BANDAGES/DRESSINGS) IMPLANT
BRUSH SCRUB EZ  4% CHG (MISCELLANEOUS) ×4
BRUSH SCRUB EZ 4% CHG (MISCELLANEOUS) ×2 IMPLANT
CANISTER SUCT 1200ML W/VALVE (MISCELLANEOUS) ×3 IMPLANT
CHLORAPREP W/TINT 26 (MISCELLANEOUS) ×3 IMPLANT
COVER WAND RF STERILE (DRAPES) ×3 IMPLANT
DRAPE 3/4 80X56 (DRAPES) ×3 IMPLANT
DRAPE U-SHAPE 47X51 STRL (DRAPES) ×3 IMPLANT
DRILL BIT CALIBRATED 4.2 (BIT) ×6
DRILL BIT SHORT 4.2 (BIT) ×2
DRSG OPSITE POSTOP 3X4 (GAUZE/BANDAGES/DRESSINGS) IMPLANT
DRSG OPSITE POSTOP 4X10 (GAUZE/BANDAGES/DRESSINGS) ×3 IMPLANT
DRSG OPSITE POSTOP 4X6 (GAUZE/BANDAGES/DRESSINGS) IMPLANT
DRSG OPSITE POSTOP 4X8 (GAUZE/BANDAGES/DRESSINGS) IMPLANT
ELECT REM PT RETURN 9FT ADLT (ELECTROSURGICAL) ×3
ELECTRODE REM PT RTRN 9FT ADLT (ELECTROSURGICAL) ×1 IMPLANT
GAUZE XEROFORM 1X8 LF (GAUZE/BANDAGES/DRESSINGS) ×3 IMPLANT
GLOVE INDICATOR 8.0 STRL GRN (GLOVE) ×3 IMPLANT
GLOVE SURG ORTHO 8.0 STRL STRW (GLOVE) ×3 IMPLANT
GOWN STRL REUS W/ TWL LRG LVL3 (GOWN DISPOSABLE) ×4 IMPLANT
GOWN STRL REUS W/ TWL XL LVL3 (GOWN DISPOSABLE) ×1 IMPLANT
GOWN STRL REUS W/TWL LRG LVL3 (GOWN DISPOSABLE) ×8
GOWN STRL REUS W/TWL XL LVL3 (GOWN DISPOSABLE) ×2
GUIDEWIRE 3.2X400 (WIRE) ×3 IMPLANT
KIT PATIENT CARE HANA TABLE (KITS) ×3 IMPLANT
KIT TURNOVER CYSTO (KITS) ×3 IMPLANT
MAT ABSORB  FLUID 56X50 GRAY (MISCELLANEOUS) ×2
MAT ABSORB FLUID 56X50 GRAY (MISCELLANEOUS) ×1 IMPLANT
NAIL TROCH FIX 10X170 130 (Nail) ×3 IMPLANT
NS IRRIG 1000ML POUR BTL (IV SOLUTION) ×3 IMPLANT
PACK HIP COMPR (MISCELLANEOUS) ×3 IMPLANT
REAMER ROD DEEP FLUTE 2.5X950 (INSTRUMENTS) ×3 IMPLANT
SCREW LOCKING 5.0X32MM (Screw) ×3 IMPLANT
STAPLER SKIN PROX 35W (STAPLE) ×3 IMPLANT
SUT VIC AB 0 CT1 36 (SUTURE) ×3 IMPLANT
SUT VIC AB 2-0 CT1 27 (SUTURE) ×4
SUT VIC AB 2-0 CT1 TAPERPNT 27 (SUTURE) ×2 IMPLANT
TOWEL OR 17X26 4PK STRL BLUE (TOWEL DISPOSABLE) ×3 IMPLANT

## 2019-07-23 NOTE — Transfer of Care (Signed)
Immediate Anesthesia Transfer of Care Note  Patient: Zamyria Pyon  Procedure(s) Performed: INTRAMEDULLARY (IM) NAIL INTERTROCHANTRIC (Right )  Patient Location: PACU  Anesthesia Type:Spinal  Level of Consciousness: awake  Airway & Oxygen Therapy: Patient Spontanous Breathing and Patient connected to face mask oxygen  Post-op Assessment: Report given to RN and Post -op Vital signs reviewed and stable  Post vital signs: stable  Last Vitals:  Vitals Value Taken Time  BP 158/61 07/23/19 1612  Temp    Pulse 55 07/23/19 1618  Resp 13 07/23/19 1618  SpO2 96 % 07/23/19 1618  Vitals shown include unvalidated device data.  Last Pain:  Vitals:   07/23/19 1415  TempSrc: Temporal  PainSc: 0-No pain         Complications: No apparent anesthesia complications

## 2019-07-23 NOTE — ED Notes (Signed)
ED TO INPATIENT HANDOFF REPORT  ED Nurse Name and Phone #:  Quillian Quince [phone]  S Name/Age/Gender Veronica Hubbard 84 y.o. female Room/Bed: ED04A/ED04A  Code Status   Code Status: DNR  Home/SNF/Other Home Patient oriented to: self, place, time and situation Is this baseline? Yes   Triage Complete: Triage complete  Chief Complaint Fall  Triage Note Patient has a witnessed fall today while trying to switch from one chair to another. She landed on her right hip. She has had recent lower back surgery. She received 106mcg of Fentanyl en route to the hospital.   Allergies Allergies  Allergen Reactions  . Aspirin Shortness Of Breath  . Celecoxib Shortness Of Breath  . Iodinated Diagnostic Agents Other (See Comments)    Reaction:  Decreased kidney function  . Losartan Itching  . Morphine Nausea And Vomiting  . Verapamil Itching    Level of Care/Admitting Diagnosis ED Disposition    ED Disposition Condition [facility]: [facility] Moore [[id number]]  Level of Care: Med-Surg [16]  Covid Evaluation: Asymptomatic Screening Protocol (No Symptoms)  Diagnosis: Closed right hip fracture, initial encounter [facility]) [id number]  Admitting Physician: Eula Flax  Attending Physician: Rufina Falco ACHIENG [phone]  Estimated length of stay: past midnight tomorrow  Certification:: I certify this patient will need inpatient services for at least 2 midnights  PT Class (Do Not Modify): Inpatient [101]  PT Acc Code (Do Not Modify): Private [1]       B Medical/Surgery History Past Medical History:  Diagnosis Date  . Anemia   . Anxiety   . Aortic anomaly    stenosis  . Asthma   . Cancer ([place]) 02/2018   bone marrow cancer  . Chronic kidney disease   . COPD (chronic obstructive pulmonary disease) ([place])   . Coronary artery disease   . Depression   . GERD (gastroesophageal reflux disease)   . Hypertension   .  Hypothyroidism   . Personal history of chemotherapy   . Spinal stenosis    Past Surgical History:  Procedure Laterality Date  . ABDOMINAL HYSTERECTOMY    . BACK SURGERY    . BREAST BIOPSY Left 2003   neg  . cataracts    . KYPHOPLASTY N/A 04/22/2019   Procedure: KYPHOPLASTY;  Surgeon: Hessie Knows, MD;  Location: [facility] ORS;  Service: Orthopedics;  Laterality: N/A;  . KYPHOPLASTY N/A 04/10/2019   Procedure: KYPHOPLASTY L3;  Surgeon: Hessie Knows, MD;  Location: [facility] ORS;  Service: Orthopedics;  Laterality: N/A;  . KYPHOPLASTY N/A 05/13/2019   Procedure: KYPHOPLASTY L1 L2;  Surgeon: Hessie Knows, MD;  Location: [facility] ORS;  Service: Orthopedics;  Laterality: N/A;  . KYPHOPLASTY N/A 06/19/2019   Procedure: L5 KYPHOPLASTY;  Surgeon: Hessie Knows, MD;  Location: [facility] ORS;  Service: Orthopedics;  Laterality: N/A;  . KYPHOPLASTY N/A 07/04/2019   Procedure: T12 KYPHOPLASTY;  Surgeon: Hessie Knows, MD;  Location: [facility] ORS;  Service: Orthopedics;  Laterality: N/A;  . lipoma removal    . SHOULDER SURGERY       A IV Location/Drains/Wounds Patient Lines/Drains/Airways Status   Active Line/Drains/Airways    Name:   Placement date:   Placement time:   Site:   Days:   Peripheral IV 07/22/19 Left Antecubital   07/22/19    2010    Antecubital   1   Airway   06/19/19    1428     34   Incision (Closed) 05/13/19 Back  Other (Comment)   05/13/19    1342     71   Incision (Closed) 06/19/19 Back   06/19/19    1453     34   Incision (Closed) 07/04/19 Back Other (Comment)   07/04/19    1156     19          Intake/Output Last 24 hours No intake or output data in the 24 hours ending 07/23/19 0026  Labs/Imaging Results for orders placed or performed during the hospital encounter of 07/22/19 (from the past 48 hour(s))  Basic metabolic panel     Status: Abnormal   Collection Time: 07/22/19  8:22 PM  Result Value Ref Range   Sodium 138 135 - 145 mmol/L   Potassium 3.1 (L) 3.5 - 5.1 mmol/L   Chloride 105  98 - 111 mmol/L   CO2 24 22 - 32 mmol/L   Glucose, Bld 117 (H) 70 - 99 mg/dL   BUN 14 8 - 23 mg/dL   Creatinine, Ser 0.87 0.44 - 1.00 mg/dL   Calcium 9.8 8.9 - 10.3 mg/dL   GFR calc non Af Amer >60 >60 mL/min   GFR calc Af Amer >60 >60 mL/min   Anion gap 9 5 - 15    Comment: Performed at [facility], [address] [place], [place] [address]  CBC with Differential     Status: Abnormal   Collection Time: 07/22/19  8:22 PM  Result Value Ref Range   WBC 23.9 (H) 4.0 - 10.5 K/uL   RBC 4.72 3.87 - 5.11 MIL/uL   Hemoglobin 12.8 12.0 - 15.0 g/dL   HCT 40.6 36.0 - 46.0 %   MCV 86.0 80.0 - 100.0 fL   MCH 27.1 26.0 - 34.0 pg   MCHC 31.5 30.0 - 36.0 g/dL   RDW 19.7 (H) 11.5 - 15.5 %   Platelets 357 150 - 400 K/uL   nRBC 0.4 (H) 0.0 - 0.2 %   Neutrophils Relative % 75 %   Neutro Abs 18.1 (H) 1.7 - 7.7 K/uL   Lymphocytes Relative 7 %   Lymphs Abs 1.8 0.7 - 4.0 K/uL   Monocytes Relative 4 %   Monocytes Absolute 1.0 0.1 - 1.0 K/uL   Eosinophils Relative 3 %   Eosinophils Absolute 0.7 (H) 0.0 - 0.5 K/uL   Basophils Relative 2 %   Basophils Absolute 0.4 (H) 0.0 - 0.1 K/uL   WBC Morphology MILD LEFT SHIFT (1-5% METAS, OCC MYELO, OCC BANDS)    RBC Morphology MORPHOLOGY UNREMARKABLE    Smear Review PLATELETS APPEAR ADEQUATE    Immature Granulocytes 9 %   Abs Immature Granulocytes 2.11 (H) 0.00 - 0.07 K/uL    Comment: Performed at [facility], [address] [place], [place] [address]  Protime-INR     Status: None   Collection Time: 07/22/19  8:22 PM  Result Value Ref Range   Prothrombin Time 14.5 11.4 - 15.2 seconds   INR 1.1 0.8 - 1.2    Comment: (NOTE) INR goal varies based on device and disease states. Performed at [facility], [address] [place], [place] [address]    Dg Chest 1 View  Result Date: 07/22/2019 CLINICAL DATA:  Status post fall today EXAM: CHEST  1 VIEW COMPARISON:  May 02, 2019 FINDINGS: The heart size and mediastinal contours  are stable. Aneurysm dilatation of the descending aorta is unchanged. Both lungs are clear. The visualized skeletal structures are unremarkable. IMPRESSION: No active  cardiopulmonary disease. Electronically Signed   By: Abelardo Diesel M.D.   On: 07/22/2019 21:13   Ct Head Wo Contrast  Result Date: 07/22/2019 CLINICAL DATA:  84 year old female status post fall landing on right hip today. EXAM: CT HEAD WITHOUT CONTRAST CT CERVICAL SPINE WITHOUT CONTRAST TECHNIQUE: Multidetector CT imaging of the head and cervical spine was performed following the standard protocol without intravenous contrast. Multiplanar CT image reconstructions of the cervical spine were also generated. COMPARISON:  Head CT 05/25/2019. FINDINGS: CT HEAD FINDINGS Brain: Chronic lacunar infarcts along the right caudate, caudothalamic groove, and anterior external capsule. Small chronic left superior cerebellar infarct. Stable gray-white matter differentiation throughout the brain. No midline shift, ventriculomegaly, mass effect, evidence of mass lesion, intracranial hemorrhage or evidence of cortically based acute infarction. Vascular: Calcified atherosclerosis at the skull base. No suspicious intracranial vascular hyperdensity. Skull: Intact. Sinuses/Orbits: Visualized paranasal sinuses and mastoids are stable and well pneumatized. Other: No orbit or scalp soft tissue injury. CT CERVICAL SPINE FINDINGS Alignment: Straightening and mild reversal of cervical lordosis. Cervicothoracic junction alignment is within normal limits. Bilateral posterior element alignment is within normal limits. Skull base and vertebrae: Visualized skull base is intact. No atlanto-occipital dissociation. No acute osseous abnormality identified. Soft tissues and spinal canal: No prevertebral fluid or swelling. No visible canal hematoma. Bulky bilateral calcified carotid artery atherosclerosis. Partially retropharyngeal course of the left carotid. Disc levels: Widespread  cervical disc, endplate, and facet degeneration. Up to mild degenerative spinal stenosis. Upper chest: Visible upper thoracic levels appear intact. Negative lung apices. Calcified proximal great vessels. IMPRESSION: 1. No acute traumatic injury identified in the head or cervical spine. 2. Stable non contrast CT appearance of the brain with chronic small vessel ischemia. 3. Widespread cervical spine degeneration. 4. Calcified atherosclerosis of the proximal great vessels and both carotids. Electronically Signed   By: Genevie Ann M.D.   On: 07/22/2019 21:10   Ct Cervical Spine Wo Contrast  Result Date: 07/22/2019 CLINICAL DATA:  84 year old female status post fall landing on right hip today. EXAM: CT HEAD WITHOUT CONTRAST CT CERVICAL SPINE WITHOUT CONTRAST TECHNIQUE: Multidetector CT imaging of the head and cervical spine was performed following the standard protocol without intravenous contrast. Multiplanar CT image reconstructions of the cervical spine were also generated. COMPARISON:  Head CT 05/25/2019. FINDINGS: CT HEAD FINDINGS Brain: Chronic lacunar infarcts along the right caudate, caudothalamic groove, and anterior external capsule. Small chronic left superior cerebellar infarct. Stable gray-white matter differentiation throughout the brain. No midline shift, ventriculomegaly, mass effect, evidence of mass lesion, intracranial hemorrhage or evidence of cortically based acute infarction. Vascular: Calcified atherosclerosis at the skull base. No suspicious intracranial vascular hyperdensity. Skull: Intact. Sinuses/Orbits: Visualized paranasal sinuses and mastoids are stable and well pneumatized. Other: No orbit or scalp soft tissue injury. CT CERVICAL SPINE FINDINGS Alignment: Straightening and mild reversal of cervical lordosis. Cervicothoracic junction alignment is within normal limits. Bilateral posterior element alignment is within normal limits. Skull base and vertebrae: Visualized skull base is intact. No  atlanto-occipital dissociation. No acute osseous abnormality identified. Soft tissues and spinal canal: No prevertebral fluid or swelling. No visible canal hematoma. Bulky bilateral calcified carotid artery atherosclerosis. Partially retropharyngeal course of the left carotid. Disc levels: Widespread cervical disc, endplate, and facet degeneration. Up to mild degenerative spinal stenosis. Upper chest: Visible upper thoracic levels appear intact. Negative lung apices. Calcified proximal great vessels. IMPRESSION: 1. No acute traumatic injury identified in the head or cervical spine. 2. Stable non contrast CT appearance of  the brain with chronic small vessel ischemia. 3. Widespread cervical spine degeneration. 4. Calcified atherosclerosis of the proximal great vessels and both carotids. Electronically Signed   By: Genevie Ann M.D.   On: 07/22/2019 21:10   Ct Femur Right Wo Contrast  Result Date: 07/22/2019 CLINICAL DATA:  Recent fall with right leg pain, initial encounter EXAM: CT OF THE LOWER RIGHT EXTREMITY WITHOUT CONTRAST TECHNIQUE: Multidetector CT imaging of the right lower extremity was performed according to the standard protocol. COMPARISON:  None. FINDINGS: Bones/Joint/Cartilage Degenerative changes of the right hip joint are noted. Undisplaced avulsion of the greater trochanter is noted with some extension into the intratrochanteric region without significant displacement. No other fracture is noted. Ligaments Suboptimally assessed by CT. Muscles and Tendons Surrounding musculature is within normal limits. Soft tissues Vascular calcifications are noted. No joint effusion is seen. No significant soft tissue abnormality is seen. IMPRESSION: Undisplaced avulsion fracture of the greater trochanter with extension of the fracture line into the intratrochanteric region. No other acute abnormality is noted. Electronically Signed   By: Inez Catalina M.D.   On: 07/22/2019 23:53   Dg Foot Complete Right  Result  Date: 07/22/2019 CLINICAL DATA:  Status post fall with right foot pain. EXAM: RIGHT FOOT COMPLETE - 3+ VIEW COMPARISON:  None. FINDINGS: There is no evidence of fracture or dislocation. Plantar calcaneal spur is noted. Soft tissues are unremarkable. IMPRESSION: No acute fracture or dislocation. Electronically Signed   By: Abelardo Diesel M.D.   On: 07/22/2019 21:14   Dg Hip Unilat W Or Wo Pelvis 2-3 Views Right  Result Date: 07/22/2019 CLINICAL DATA:  84 year old female status post fall with right hip pain. EXAM: DG HIP (WITH OR WITHOUT PELVIS) 2-3V RIGHT COMPARISON:  CT Abdomen and Pelvis 05/25/2019 and earlier. FINDINGS: Partially visible multilevel thoracic vertebral augmentation. Femoral heads remain normally located. Asymmetric right hip joint space loss redemonstrated. The pelvis and SI joints appear stable and intact. Grossly intact proximal left femur. The proximal right femur appears intact. IMPRESSION: No acute fracture or dislocation identified about the right hip or pelvis. Electronically Signed   By: Genevie Ann M.D.   On: 07/22/2019 21:12    Pending Labs Unresulted Labs (From admission, onward)    Start     Ordered   07/22/19 2345  SARS CORONAVIRUS 2 (TAT 6-24 HRS) Nasopharyngeal Nasopharyngeal Swab  (Asymptomatic/Tier 2 Patients Labs)  Once,   STAT    Question Answer Comment  Is this test for diagnosis or screening Screening   Symptomatic for COVID-19 as defined by CDC No   Hospitalized for COVID-19 No   Admitted to ICU for COVID-19 No   Previously tested for COVID-19 Yes   Resident in a congregate (group) care setting No   Employed in healthcare setting No   Pregnant No      07/22/19 2344          Vitals/Pain Today's Vitals   07/22/19 2033 07/22/19 2124 07/22/19 2330 07/22/19 2336  BP:   (!) 169/58   Pulse:   68   Resp:   12   Temp:      SpO2:   93%   Weight: 50 kg     Height: 5\' 3"  (1.6 m)     PainSc:  8   10-Worst pain ever    Isolation Precautions No active  isolations  Medications Medications  HYDROcodone-acetaminophen (NORCO/VICODIN) 5-325 MG per tablet 1-2 tablet (has no administration in time range)  morphine 2 MG/ML injection 0.5  mg (has no administration in time range)  fentaNYL (SUBLIMAZE) injection 50 mcg (50 mcg Intravenous Given 07/22/19 2028)  sodium chloride 0.9 % bolus 500 mL (500 mLs Intravenous New Bag/Given 07/22/19 2120)  HYDROmorphone (DILAUDID) injection 1 mg (1 mg Intravenous Given 07/22/19 2252)    Mobility walks with device High fall risk   Focused Assessments Musculoskeletal   R Recommendations: See Admitting Provider Note  Report given to:   Additional Notes:

## 2019-07-23 NOTE — Progress Notes (Signed)
Patient ID: Veronica Hubbard, female   DOB: 1934/12/20, 84 y.o.   MRN: [id number]  [facility] PROGRESS NOTE  Veronica Hubbard [id number] DOB: Jun 30, 1935 DOA: 07/22/2019 PCP: Alfonse Flavors, MD  HPI/Subjective: Patient seen early this morning.  Complains of hip pain.  States that she was sweeping and fell.  Objective: Vitals:   07/23/19 1059 07/23/19 1415  BP: (!) 153/60 (!) 160/62  Pulse: 65 (!) 58  Resp:  18  Temp:  98.9 F (37.2 C)  SpO2:  96%    Filed Weights   07/22/19 2033 07/23/19 0314 07/23/19 1415  Weight: 50 kg 52.1 kg 52 kg    ROS: Review of Systems  Constitutional: Negative for chills and fever.  Eyes: Negative for blurred vision.  Respiratory: Negative for cough, shortness of breath and wheezing.   Cardiovascular: Negative for chest pain.  Gastrointestinal: Negative for abdominal pain, constipation, diarrhea, nausea and vomiting.  Genitourinary: Negative for dysuria.  Musculoskeletal: Positive for joint pain.  Neurological: Negative for dizziness and headaches.   Exam: Physical Exam  Constitutional: She is oriented to person, place, and time.  HENT:  Nose: No mucosal edema.  Mouth/Throat: No oropharyngeal exudate or posterior oropharyngeal edema.  Eyes: Pupils are equal, round, and reactive to light. Conjunctivae, EOM and lids are normal.  Neck: No JVD present. Carotid bruit is not present. No edema present. No thyroid mass and no thyromegaly present.  Cardiovascular: S1 normal and S2 normal. Exam reveals no gallop.  No murmur heard. Pulses:      Dorsalis pedis pulses are 2+ on the right side and 2+ on the left side.  Respiratory: No respiratory distress. She has decreased breath sounds in the right lower field and the left lower field. She has no wheezes. She has no rhonchi. She has no rales.  GI: Soft. Bowel sounds are normal. There is no abdominal tenderness.  Musculoskeletal:     Right ankle: She exhibits no swelling.      Left ankle: She exhibits no swelling.  Lymphadenopathy:    She has no cervical adenopathy.  Neurological: She is alert and oriented to person, place, and time. No cranial nerve deficit.  Skin: Skin is warm. No rash noted. Nails show no clubbing.  Psychiatric: She has a normal mood and affect.      Data Reviewed: Basic Metabolic Panel: Recent Labs  Lab 07/22/19 2022 07/23/19 0615 07/23/19 1119  NA 138 140  --   K 3.1* 2.8* 3.3*  CL 105 106  --   CO2 24 26  --   GLUCOSE 117* 104*  --   BUN 14 12  --   CREATININE 0.87 0.79  --   CALCIUM 9.8 9.6  --   MG  --  1.9  --    CBC: Recent Labs  Lab 07/22/19 2022 07/23/19 0615  WBC 23.9* 24.5*  NEUTROABS 18.1* 18.9*  HGB 12.8 12.7  HCT 40.6 39.7  MCV 86.0 85.4  PLT 357 338   BNP (last 3 results) Recent Labs    05/02/19 1423  BNP 62.0     Recent Results (from the past 240 hour(s))  SARS Coronavirus 2 [facility] order, Performed in [facility] hospital lab) Nasopharyngeal Nasopharyngeal Swab     Status: None   Collection Time: 07/23/19 12:55 AM   Specimen: Nasopharyngeal Swab  Result Value Ref Range Status   SARS Coronavirus 2 NEGATIVE NEGATIVE Final    Comment: (NOTE) If result is NEGATIVE SARS-CoV-2 target nucleic acids are  NOT DETECTED. The SARS-CoV-2 RNA is generally detectable in upper and lower  respiratory specimens during the acute phase of infection. The lowest  concentration of SARS-CoV-2 viral copies this assay can detect is 250  copies / mL. A negative result does not preclude SARS-CoV-2 infection  and should not be used as the sole basis for treatment or other  patient management decisions.  A negative result may occur with  improper specimen collection / handling, submission of specimen other  than nasopharyngeal swab, presence of viral mutation(s) within the  areas targeted by this assay, and inadequate number of viral copies  (<250 copies / mL). A negative result must be combined with clinical   observations, patient history, and epidemiological information. If result is POSITIVE SARS-CoV-2 target nucleic acids are DETECTED. The SARS-CoV-2 RNA is generally detectable in upper and lower  respiratory specimens dur ing the acute phase of infection.  Positive  results are indicative of active infection with SARS-CoV-2.  Clinical  correlation with patient history and other diagnostic information is  necessary to determine patient infection status.  Positive results do  not rule out bacterial infection or co-infection with other viruses. If result is PRESUMPTIVE POSTIVE SARS-CoV-2 nucleic acids MAY BE PRESENT.   A presumptive positive result was obtained on the submitted specimen  and confirmed on repeat testing.  While 2019 novel coronavirus  (SARS-CoV-2) nucleic acids may be present in the submitted sample  additional confirmatory testing may be necessary for epidemiological  and / or clinical management purposes  to differentiate between  SARS-CoV-2 and other Sarbecovirus currently known to infect humans.  If clinically indicated additional testing with an alternate test  methodology [phone]) is advised. The SARS-CoV-2 RNA is generally  detectable in upper and lower respiratory sp ecimens during the acute  phase of infection. The expected result is Negative. Fact Sheet for Patients:  StrictlyIdeas.no Fact Sheet for Healthcare Providers: BankingDealers.co.za This test is not yet approved or cleared by the [place] FDA and has been authorized for detection and/or diagnosis of SARS-CoV-2 by FDA under an Emergency Use Authorization (EUA).  This EUA will remain in effect (meaning this test can be used) for the duration of the COVID-19 declaration under Section 564(b)(1) of the Act, 21 U.S.C. section 360bbb-3(b)(1), unless the authorization is terminated or revoked sooner. Performed at [name], [address] [place], [place] [address]   MRSA PCR Screening     Status: Abnormal   Collection Time: 07/23/19  3:49 AM   Specimen: Nasopharyngeal  Result Value Ref Range Status   MRSA by PCR POSITIVE (A) NEGATIVE Final    Comment:        The GeneXpert MRSA Assay (FDA approved for NASAL specimens only), is one component of a comprehensive MRSA colonization surveillance program. It is not intended to diagnose MRSA infection nor to guide or monitor treatment for MRSA infections. RESULT CALLED TO, READ BACK BY AND VERIFIED WITH:  Athena Masse AT [id number] 07/23/2019 SDR Performed at [facility], [address] [place], [place] [address]      Studies: Dg Chest 1 View  Result Date: 07/22/2019 CLINICAL DATA:  Status post fall today EXAM: CHEST  1 VIEW COMPARISON:  May 02, 2019 FINDINGS: The heart size and mediastinal contours are stable. Aneurysm dilatation of the descending aorta is unchanged. Both lungs are clear. The visualized skeletal structures are unremarkable. IMPRESSION: No active cardiopulmonary disease. Electronically Signed   By: Abelardo Diesel M.D.   On: 07/22/2019  21:13   Ct Head Wo Contrast  Result Date: 07/22/2019 CLINICAL DATA:  84 year old female status post fall landing on right hip today. EXAM: CT HEAD WITHOUT CONTRAST CT CERVICAL SPINE WITHOUT CONTRAST TECHNIQUE: Multidetector CT imaging of the head and cervical spine was performed following the standard protocol without intravenous contrast. Multiplanar CT image reconstructions of the cervical spine were also generated. COMPARISON:  Head CT 05/25/2019. FINDINGS: CT HEAD FINDINGS Brain: Chronic lacunar infarcts along the right caudate, caudothalamic groove, and anterior external capsule. Small chronic left superior cerebellar infarct. Stable gray-white matter differentiation throughout the brain. No midline shift, ventriculomegaly, mass effect, evidence of mass lesion, intracranial hemorrhage or evidence of cortically based  acute infarction. Vascular: Calcified atherosclerosis at the skull base. No suspicious intracranial vascular hyperdensity. Skull: Intact. Sinuses/Orbits: Visualized paranasal sinuses and mastoids are stable and well pneumatized. Other: No orbit or scalp soft tissue injury. CT CERVICAL SPINE FINDINGS Alignment: Straightening and mild reversal of cervical lordosis. Cervicothoracic junction alignment is within normal limits. Bilateral posterior element alignment is within normal limits. Skull base and vertebrae: Visualized skull base is intact. No atlanto-occipital dissociation. No acute osseous abnormality identified. Soft tissues and spinal canal: No prevertebral fluid or swelling. No visible canal hematoma. Bulky bilateral calcified carotid artery atherosclerosis. Partially retropharyngeal course of the left carotid. Disc levels: Widespread cervical disc, endplate, and facet degeneration. Up to mild degenerative spinal stenosis. Upper chest: Visible upper thoracic levels appear intact. Negative lung apices. Calcified proximal great vessels. IMPRESSION: 1. No acute traumatic injury identified in the head or cervical spine. 2. Stable non contrast CT appearance of the brain with chronic small vessel ischemia. 3. Widespread cervical spine degeneration. 4. Calcified atherosclerosis of the proximal great vessels and both carotids. Electronically Signed   By: Genevie Ann M.D.   On: 07/22/2019 21:10   Ct Cervical Spine Wo Contrast  Result Date: 07/22/2019 CLINICAL DATA:  84 year old female status post fall landing on right hip today. EXAM: CT HEAD WITHOUT CONTRAST CT CERVICAL SPINE WITHOUT CONTRAST TECHNIQUE: Multidetector CT imaging of the head and cervical spine was performed following the standard protocol without intravenous contrast. Multiplanar CT image reconstructions of the cervical spine were also generated. COMPARISON:  Head CT 05/25/2019. FINDINGS: CT HEAD FINDINGS Brain: Chronic lacunar infarcts along the right  caudate, caudothalamic groove, and anterior external capsule. Small chronic left superior cerebellar infarct. Stable gray-white matter differentiation throughout the brain. No midline shift, ventriculomegaly, mass effect, evidence of mass lesion, intracranial hemorrhage or evidence of cortically based acute infarction. Vascular: Calcified atherosclerosis at the skull base. No suspicious intracranial vascular hyperdensity. Skull: Intact. Sinuses/Orbits: Visualized paranasal sinuses and mastoids are stable and well pneumatized. Other: No orbit or scalp soft tissue injury. CT CERVICAL SPINE FINDINGS Alignment: Straightening and mild reversal of cervical lordosis. Cervicothoracic junction alignment is within normal limits. Bilateral posterior element alignment is within normal limits. Skull base and vertebrae: Visualized skull base is intact. No atlanto-occipital dissociation. No acute osseous abnormality identified. Soft tissues and spinal canal: No prevertebral fluid or swelling. No visible canal hematoma. Bulky bilateral calcified carotid artery atherosclerosis. Partially retropharyngeal course of the left carotid. Disc levels: Widespread cervical disc, endplate, and facet degeneration. Up to mild degenerative spinal stenosis. Upper chest: Visible upper thoracic levels appear intact. Negative lung apices. Calcified proximal great vessels. IMPRESSION: 1. No acute traumatic injury identified in the head or cervical spine. 2. Stable non contrast CT appearance of the brain with chronic small vessel ischemia. 3. Widespread cervical spine degeneration. 4. Calcified atherosclerosis  of the proximal great vessels and both carotids. Electronically Signed   By: Genevie Ann M.D.   On: 07/22/2019 21:10   Ct Femur Right Wo Contrast  Result Date: 07/22/2019 CLINICAL DATA:  Recent fall with right leg pain, initial encounter EXAM: CT OF THE LOWER RIGHT EXTREMITY WITHOUT CONTRAST TECHNIQUE: Multidetector CT imaging of the right lower  extremity was performed according to the standard protocol. COMPARISON:  None. FINDINGS: Bones/Joint/Cartilage Degenerative changes of the right hip joint are noted. Undisplaced avulsion of the greater trochanter is noted with some extension into the intratrochanteric region without significant displacement. No other fracture is noted. Ligaments Suboptimally assessed by CT. Muscles and Tendons Surrounding musculature is within normal limits. Soft tissues Vascular calcifications are noted. No joint effusion is seen. No significant soft tissue abnormality is seen. IMPRESSION: Undisplaced avulsion fracture of the greater trochanter with extension of the fracture line into the intratrochanteric region. No other acute abnormality is noted. Electronically Signed   By: Inez Catalina M.D.   On: 07/22/2019 23:53   Dg Foot Complete Right  Result Date: 07/22/2019 CLINICAL DATA:  Status post fall with right foot pain. EXAM: RIGHT FOOT COMPLETE - 3+ VIEW COMPARISON:  None. FINDINGS: There is no evidence of fracture or dislocation. Plantar calcaneal spur is noted. Soft tissues are unremarkable. IMPRESSION: No acute fracture or dislocation. Electronically Signed   By: Abelardo Diesel M.D.   On: 07/22/2019 21:14   Dg Hip Unilat W Or Wo Pelvis 2-3 Views Right  Result Date: 07/22/2019 CLINICAL DATA:  84 year old female status post fall with right hip pain. EXAM: DG HIP (WITH OR WITHOUT PELVIS) 2-3V RIGHT COMPARISON:  CT Abdomen and Pelvis 05/25/2019 and earlier. FINDINGS: Partially visible multilevel thoracic vertebral augmentation. Femoral heads remain normally located. Asymmetric right hip joint space loss redemonstrated. The pelvis and SI joints appear stable and intact. Grossly intact proximal left femur. The proximal right femur appears intact. IMPRESSION: No acute fracture or dislocation identified about the right hip or pelvis. Electronically Signed   By: Genevie Ann M.D.   On: 07/22/2019 21:12    Scheduled Meds: . [MAR  Hold] allopurinol  200 mg Oral Daily  . [MAR Hold] azithromycin  250 mg Oral Q M,W,F  . [MAR Hold] Chlorhexidine Gluconate Cloth  6 each Topical Q0600  . [MAR Hold] cholecalciferol  1,000 Units Oral Daily  . [MAR Hold] citalopram  20 mg Oral Daily  . [MAR Hold] cyclobenzaprine  5 mg Oral TID  . [MAR Hold] fluticasone furoate-vilanterol  1 puff Inhalation QHS   And  . [MAR Hold] umeclidinium bromide  1 puff Inhalation QHS  . [MAR Hold] hydrALAZINE  50 mg Oral Q8H  . [MAR Hold] isosorbide mononitrate  30 mg Oral Daily  . [START ON 07/24/2019] levothyroxine  150 mcg Oral QAC breakfast  . [MAR Hold] lidocaine  1 patch Transdermal Q12H  . [MAR Hold] Melatonin  5 mg Oral QHS  . [MAR Hold] metoprolol tartrate  25 mg Oral BID  . [MAR Hold] mupirocin ointment  1 application Nasal BID  . [MAR Hold] tranexamic acid (CYKLOKAPRON) topical -INTRAOP  2,000 mg Topical Once   Continuous Infusions: . 0.9 % NaCl with KCl 20 mEq / L 50 mL/hr at 07/23/19 0955  . lactated ringers 10 mL/hr at 07/23/19 1422    Assessment/Plan:  1. Right hip fracture requiring operative repair.  Pain controlled. 2. Hypomagnesemia.  Replace magnesium this morning 3. Hypokalemia replace potassium and IV fluids.  Repeat potassium better.  4. Chronically elevated white blood cell count.  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  Could be CLL. 5. Hypertension.  Continue usual medications 6. Hypothyroidism on Synthroid 7. Depression on Celexa 8. COPD on inhalers.  Code Status:     Code Status Orders  (From admission, onward)         Start     Ordered   07/23/19 0007  Do not attempt resuscitation (DNR)  Continuous    Question Answer Comment  In the event of cardiac or respiratory ARREST Do not call a "code blue"   In the event of cardiac or respiratory ARREST Do not perform Intubation, CPR, defibrillation or ACLS   In the event of cardiac or respiratory ARREST Use medication by any route, position, wound care, and other measures  to relive pain and suffering. May use oxygen, suction and manual treatment of airway obstruction as needed for comfort.      07/23/19 0010        Code Status History    Date Active Date Inactive Code Status Order ID Comments User Context   05/20/2019 1934 05/26/2019 2134 Full Code [id number]  Max Sane, MD Inpatient   05/11/2019 1219 05/19/2019 1742 Full Code [id number]  Otila Back, MD ED   04/19/2019 0539 04/27/2019 1712 Full Code [id number]  Harrie Foreman, MD Inpatient   02/24/2016 0024 02/24/2016 2023 Full Code [id number]  Henreitta Leber, MD Inpatient   02/09/2016 1042 02/10/2016 2023 Full Code [id number]  Loletha Grayer, MD ED   Advance Care Planning Activity    Advance Directive Documentation     Most Recent Value  Type of Advance Directive  Out of facility DNR (pink MOST or yellow form)  Pre-existing out of facility DNR order (yellow form or pink MOST form)  Pink MOST/Yellow Form most recent copy in chart - Physician notified to receive inpatient order  "MOST" Form in Place?  -     Disposition Plan: Likely will need rehab  Time spent: 32 minutes  [place]

## 2019-07-23 NOTE — Anesthesia Preprocedure Evaluation (Addendum)
Anesthesia Evaluation  Patient identified by MRN, date of birth, ID band Patient awake    Reviewed: Allergy & Precautions, H&P , NPO status , Patient's Chart, lab work & pertinent test results  Airway Mallampati: II  TM Distance: >3 FB     Dental  (+) Edentulous Upper, Missing   Pulmonary COPD,  COPD inhaler and oxygen dependent, Current Smoker,           Cardiovascular hypertension, + CAD       Neuro/Psych PSYCHIATRIC DISORDERS Anxiety Depression negative neurological ROS     GI/Hepatic Neg liver ROS, GERD  Controlled,  Endo/Other  Hypothyroidism   Renal/GU CRFRenal disease  negative genitourinary   Musculoskeletal   Abdominal   Peds  Hematology  (+) Blood dyscrasia, anemia ,   Anesthesia Other Findings Past Medical History: No date: Anemia No date: Anxiety No date: Aortic anomaly     Comment:  stenosis No date: Asthma 02/2018: Cancer ([place])     Comment:  bone marrow cancer No date: Chronic kidney disease No date: COPD (chronic obstructive pulmonary disease) (HCC) No date: Coronary artery disease No date: Depression No date: GERD (gastroesophageal reflux disease) No date: Hypertension No date: Hypothyroidism No date: Personal history of chemotherapy No date: Spinal stenosis  Past Surgical History: No date: ABDOMINAL HYSTERECTOMY No date: BACK SURGERY 2003: BREAST BIOPSY; Left     Comment:  neg No date: cataracts 04/22/2019: KYPHOPLASTY; N/A     Comment:  Procedure: KYPHOPLASTY;  Surgeon: Hessie Knows, MD;                Location: [facility] ORS;  Service: Orthopedics;  Laterality:               N/A; 04/10/2019: KYPHOPLASTY; N/A     Comment:  Procedure: KYPHOPLASTY L3;  Surgeon: Hessie Knows, MD;               Location: [facility] ORS;  Service: Orthopedics;  Laterality:               N/A; 05/13/2019: KYPHOPLASTY; N/A     Comment:  Procedure: KYPHOPLASTY L1 L2;  Surgeon: Hessie Knows,               MD;   Location: [facility] ORS;  Service: Orthopedics;                Laterality: N/A; 06/19/2019: KYPHOPLASTY; N/A     Comment:  Procedure: L5 KYPHOPLASTY;  Surgeon: Hessie Knows, MD;               Location: [facility] OR[facility];  Service: Orthopedics;  Laterality:               N/A; 07/04/2019: KYPHOPLASTY; N/A     Comment:  Procedure: T12 KYPHOPLASTY;  Surgeon: Hessie Knows, MD;              Location: [facility] OR[facility];  Service: Orthopedics;  Laterality:               N/A; No date: lipoma removal No date: SHOULDER SURGERY  BMI    Body Mass Index: 20.31 kg/m      Reproductive/Obstetrics negative OB ROS                             Anesthesia Physical Anesthesia Plan  ASA: III  Anesthesia Plan: Spinal   Post-op Pain Management:    Induction:   PONV Risk Score and Plan: Propofol infusion and TIVA  Airway Management Planned: Natural Airway and Simple Face Mask  Additional Equipment:   Intra-op Plan:   Post-operative Plan:   Informed Consent: I have reviewed the patients History and Physical, chart, labs and discussed the procedure including the risks, benefits and alternatives for the proposed anesthesia with the patient or authorized representative who has indicated his/her understanding and acceptance.     Dental Advisory Given  Plan Discussed with: Anesthesiologist and CRNA  Anesthesia Plan Comments:        Anesthesia Quick Evaluation

## 2019-07-23 NOTE — Anesthesia Post-op Follow-up Note (Signed)
Anesthesia QCDR form completed.        

## 2019-07-23 NOTE — Consult Note (Signed)
ORTHOPAEDIC CONSULTATION  REQUESTING PHYSICIAN: Loletha Grayer, MD  Chief Complaint: right hip pain  HPI: Veronica Hubbard is a 84 y.o. female who complains of right hip pain. Please see H&P and ED notes for details. Denies any numbness, tingling or constitutional symptoms.  Past Medical History:  Diagnosis Date  . Anemia   . Anxiety   . Aortic anomaly    stenosis  . Asthma   . Cancer ([place]) 02/2018   bone marrow cancer  . Chronic kidney disease   . COPD (chronic obstructive pulmonary disease) ([place])   . Coronary artery disease   . Depression   . GERD (gastroesophageal reflux disease)   . Hypertension   . Hypothyroidism   . Personal history of chemotherapy   . Spinal stenosis    Past Surgical History:  Procedure Laterality Date  . ABDOMINAL HYSTERECTOMY    . BACK SURGERY    . BREAST BIOPSY Left 2003   neg  . cataracts    . KYPHOPLASTY N/A 04/22/2019   Procedure: KYPHOPLASTY;  Surgeon: Hessie Knows, MD;  Location: [facility];  Service: Orthopedics;  Laterality: N/A;  . KYPHOPLASTY N/A 04/10/2019   Procedure: KYPHOPLASTY L3;  Surgeon: Hessie Knows, MD;  Location: [facility];  Service: Orthopedics;  Laterality: N/A;  . KYPHOPLASTY N/A 05/13/2019   Procedure: KYPHOPLASTY L1 L2;  Surgeon: Hessie Knows, MD;  Location: [facility];  Service: Orthopedics;  Laterality: N/A;  . KYPHOPLASTY N/A 06/19/2019   Procedure: L5 KYPHOPLASTY;  Surgeon: Hessie Knows, MD;  Location: [facility];  Service: Orthopedics;  Laterality: N/A;  . KYPHOPLASTY N/A 07/04/2019   Procedure: T12 KYPHOPLASTY;  Surgeon: Hessie Knows, MD;  Location: [facility];  Service: Orthopedics;  Laterality: N/A;  . lipoma removal    . SHOULDER SURGERY     Social History   Socioeconomic History  . Marital status: Single    Spouse name: Not on file  . Number of children: Not on file  . Years of education: Not on file  . Highest education level: Not on file  Occupational History  . Not on file  Social Needs  .  Financial resource strain: Not on file  . Food insecurity    Worry: Not on file    Inability: Not on file  . Transportation needs    Medical: Not on file    Non-medical: Not on file  Tobacco Use  . Smoking status: Current Every Day Smoker    Types: Cigarettes  . Smokeless tobacco: Never Used  Substance and Sexual Activity  . Alcohol use: No  . Drug use: No  . Sexual activity: Not on file  Lifestyle  . Physical activity    Days per week: Not on file    Minutes per session: Not on file  . Stress: Not on file  Relationships  . Social Herbalist on phone: Not on file    Gets together: Not on file    Attends religious service: Not on file    Active member of club or organization: Not on file    Attends meetings of clubs or organizations: Not on file    Relationship status: Not on file  Other Topics Concern  . Not on file  Social History Narrative  . Not on file   Family History  Problem Relation Age of Onset  . Leukemia Mother   . CAD Father   . Diabetes Father   . Breast cancer Sister 17   Allergies  Allergen Reactions  .  Aspirin Shortness Of Breath  . Celecoxib Shortness Of Breath  . Iodinated Diagnostic Agents Other (See Comments)    Reaction:  Decreased kidney function  . Losartan Itching  . Morphine Nausea And Vomiting  . Verapamil Itching   Prior to Admission medications   Medication Sig Start Date End Date Taking? Authorizing Provider  acetaminophen (TYLENOL) 325 MG tablet Take 2 tablets (650 mg total) by mouth every 6 (six) hours as needed for mild pain (or Fever >/= 101). 04/27/19  Yes Mayo, Pete Pelt, MD  albuterol (PROVENTIL) (2.5 MG/3ML) 0.083% nebulizer solution Take 2.5 mg by nebulization every 6 (six) hours as needed for wheezing or shortness of breath.   Yes [provider]  alendronate (FOSAMAX) 70 MG tablet Take 70 mg by mouth every Monday. Take with a full glass of water on an empty stomach.   Yes [provider]   allopurinol (ZYLOPRIM) 100 MG tablet Take 200 mg by mouth daily. 05/04/18  Yes [provider]  azithromycin (ZITHROMAX) 250 MG tablet Take 250 mg by mouth every Monday, Wednesday, and Friday.   Yes [provider]  Cholecalciferol (VITAMIN D3) 25 MCG (1000 UT) CAPS Take 1,000 Units by mouth daily.   Yes [provider]  citalopram (CELEXA) 20 MG tablet Take 20 mg by mouth daily.   Yes [provider]  cyclobenzaprine (FLEXERIL) 5 MG tablet Take 5 mg by mouth 3 (three) times daily.    Yes [provider]  hydrALAZINE (APRESOLINE) 50 MG tablet Take 1 tablet (50 mg total) by mouth every 8 (eight) hours. 04/27/19  Yes Mayo, Pete Pelt, MD  isosorbide mononitrate (IMDUR) 30 MG 24 hr tablet Take 30 mg by mouth daily.   Yes [provider]  levothyroxine (SYNTHROID) 150 MCG tablet Take 150 mcg by mouth daily before breakfast.    Yes [provider]  Melatonin 5 MG TABS Take 5 mg by mouth at bedtime.   Yes [provider]  metoCLOPramide (REGLAN) 5 MG tablet Take 1 tablet (5 mg total) by mouth every 8 (eight) hours as needed for nausea or refractory nausea / vomiting. 05/26/19  Yes Lang Snow, NP  metoprolol tartrate (LOPRESSOR) 25 MG tablet Take 1 tablet (25 mg total) by mouth 2 (two) times daily. 05/19/19  Yes Vaughan Basta, MD  mirtazapine (REMERON) 7.5 MG tablet Take 1 tablet by mouth daily. 07/10/19  Yes [provider]  Omega-3 Fatty Acids (FISH OIL) 1000 MG CAPS Take 1,000 mg by mouth at bedtime.   Yes [provider]  oxyCODONE-acetaminophen (PERCOCET) 5-325 MG tablet Take 1 tablet by mouth every 4 (four) hours as needed for severe pain. 07/04/19 07/03/20 Yes Hessie Knows, MD  pantoprazole (PROTONIX) 40 MG tablet Take 40 mg by mouth 2 (two) times daily.   Yes [provider]  polyethylene glycol (MIRALAX / GLYCOLAX) 17 g packet Take 17 g by mouth daily as needed. Patient taking  differently: Take 17 g by mouth daily as needed (constipation.).  04/27/19  Yes Mayo, Pete Pelt, MD  TRELEGY ELLIPTA 100-62.5-25 MCG/INH AEPB Inhale 1 puff into the lungs at bedtime.   Yes [provider]  triamcinolone cream (KENALOG) 0.5 % Apply 1 application topically 2 (two) times daily as needed (skin rash).    Yes [provider]  lidocaine (LIDODERM) 5 % Place 1 patch onto the skin every 12 (twelve) hours. Remove & Discard patch within 12 hours or as directed by MD 07/10/19 07/09/20  Merlyn Lot,  MD  oxyCODONE-acetaminophen (PERCOCET) 7.5-325 MG tablet Take 1 tablet by mouth every 6 (six) hours as needed for moderate pain or severe pain. Patient not taking: Reported on 07/23/2019 05/19/19   Vaughan Basta, MD  oxyCODONE-acetaminophen (PERCOCET) 7.5-325 MG tablet Take 1 tablet by mouth every 4 (four) hours as needed for severe pain. Patient not taking: Reported on 07/23/2019 06/19/19 06/18/20  Hessie Knows, MD  sertraline (ZOLOFT) 25 MG tablet Take 1 tablet (25 mg total) by mouth daily. Patient not taking: Reported on 07/23/2019 05/20/19   Vaughan Basta, MD   Dg Chest 1 View  Result Date: 07/22/2019 CLINICAL DATA:  Status post fall today EXAM: CHEST  1 VIEW COMPARISON:  May 02, 2019 FINDINGS: The heart size and mediastinal contours are stable. Aneurysm dilatation of the descending aorta is unchanged. Both lungs are clear. The visualized skeletal structures are unremarkable. IMPRESSION: No active cardiopulmonary disease. Electronically Signed   By: Abelardo Diesel M.D.   On: 07/22/2019 21:13   Ct Head Wo Contrast  Result Date: 07/22/2019 CLINICAL DATA:  84 year old female status post fall landing on right hip today. EXAM: CT HEAD WITHOUT CONTRAST CT CERVICAL SPINE WITHOUT CONTRAST TECHNIQUE: Multidetector CT imaging of the head and cervical spine was performed following the standard protocol without intravenous contrast. Multiplanar CT image reconstructions of the  cervical spine were also generated. COMPARISON:  Head CT 05/25/2019. FINDINGS: CT HEAD FINDINGS Brain: Chronic lacunar infarcts along the right caudate, caudothalamic groove, and anterior external capsule. Small chronic left superior cerebellar infarct. Stable gray-white matter differentiation throughout the brain. No midline shift, ventriculomegaly, mass effect, evidence of mass lesion, intracranial hemorrhage or evidence of cortically based acute infarction. Vascular: Calcified atherosclerosis at the skull base. No suspicious intracranial vascular hyperdensity. Skull: Intact. Sinuses/Orbits: Visualized paranasal sinuses and mastoids are stable and well pneumatized. Other: No orbit or scalp soft tissue injury. CT CERVICAL SPINE FINDINGS Alignment: Straightening and mild reversal of cervical lordosis. Cervicothoracic junction alignment is within normal limits. Bilateral posterior element alignment is within normal limits. Skull base and vertebrae: Visualized skull base is intact. No atlanto-occipital dissociation. No acute osseous abnormality identified. Soft tissues and spinal canal: No prevertebral fluid or swelling. No visible canal hematoma. Bulky bilateral calcified carotid artery atherosclerosis. Partially retropharyngeal course of the left carotid. Disc levels: Widespread cervical disc, endplate, and facet degeneration. Up to mild degenerative spinal stenosis. Upper chest: Visible upper thoracic levels appear intact. Negative lung apices. Calcified proximal great vessels. IMPRESSION: 1. No acute traumatic injury identified in the head or cervical spine. 2. Stable non contrast CT appearance of the brain with chronic small vessel ischemia. 3. Widespread cervical spine degeneration. 4. Calcified atherosclerosis of the proximal great vessels and both carotids. Electronically Signed   By: Genevie Ann M.D.   On: 07/22/2019 21:10   Ct Cervical Spine Wo Contrast  Result Date: 07/22/2019 CLINICAL DATA:  84 year old  female status post fall landing on right hip today. EXAM: CT HEAD WITHOUT CONTRAST CT CERVICAL SPINE WITHOUT CONTRAST TECHNIQUE: Multidetector CT imaging of the head and cervical spine was performed following the standard protocol without intravenous contrast. Multiplanar CT image reconstructions of the cervical spine were also generated. COMPARISON:  Head CT 05/25/2019. FINDINGS: CT HEAD FINDINGS Brain: Chronic lacunar infarcts along the right caudate, caudothalamic groove, and anterior external capsule. Small chronic left superior cerebellar infarct. Stable gray-white matter differentiation throughout the brain. No midline shift, ventriculomegaly, mass effect, evidence of mass lesion, intracranial hemorrhage or evidence of cortically based acute infarction. Vascular: Calcified  atherosclerosis at the skull base. No suspicious intracranial vascular hyperdensity. Skull: Intact. Sinuses/Orbits: Visualized paranasal sinuses and mastoids are stable and well pneumatized. Other: No orbit or scalp soft tissue injury. CT CERVICAL SPINE FINDINGS Alignment: Straightening and mild reversal of cervical lordosis. Cervicothoracic junction alignment is within normal limits. Bilateral posterior element alignment is within normal limits. Skull base and vertebrae: Visualized skull base is intact. No atlanto-occipital dissociation. No acute osseous abnormality identified. Soft tissues and spinal canal: No prevertebral fluid or swelling. No visible canal hematoma. Bulky bilateral calcified carotid artery atherosclerosis. Partially retropharyngeal course of the left carotid. Disc levels: Widespread cervical disc, endplate, and facet degeneration. Up to mild degenerative spinal stenosis. Upper chest: Visible upper thoracic levels appear intact. Negative lung apices. Calcified proximal great vessels. IMPRESSION: 1. No acute traumatic injury identified in the head or cervical spine. 2. Stable non contrast CT appearance of the brain with  chronic small vessel ischemia. 3. Widespread cervical spine degeneration. 4. Calcified atherosclerosis of the proximal great vessels and both carotids. Electronically Signed   By: Genevie Ann M.D.   On: 07/22/2019 21:10   Ct Femur Right Wo Contrast  Result Date: 07/22/2019 CLINICAL DATA:  Recent fall with right leg pain, initial encounter EXAM: CT OF THE LOWER RIGHT EXTREMITY WITHOUT CONTRAST TECHNIQUE: Multidetector CT imaging of the right lower extremity was performed according to the standard protocol. COMPARISON:  None. FINDINGS: Bones/Joint/Cartilage Degenerative changes of the right hip joint are noted. Undisplaced avulsion of the greater trochanter is noted with some extension into the intratrochanteric region without significant displacement. No other fracture is noted. Ligaments Suboptimally assessed by CT. Muscles and Tendons Surrounding musculature is within normal limits. Soft tissues Vascular calcifications are noted. No joint effusion is seen. No significant soft tissue abnormality is seen. IMPRESSION: Undisplaced avulsion fracture of the greater trochanter with extension of the fracture line into the intratrochanteric region. No other acute abnormality is noted. Electronically Signed   By: Inez Catalina M.D.   On: 07/22/2019 23:53   Dg Foot Complete Right  Result Date: 07/22/2019 CLINICAL DATA:  Status post fall with right foot pain. EXAM: RIGHT FOOT COMPLETE - 3+ VIEW COMPARISON:  None. FINDINGS: There is no evidence of fracture or dislocation. Plantar calcaneal spur is noted. Soft tissues are unremarkable. IMPRESSION: No acute fracture or dislocation. Electronically Signed   By: Abelardo Diesel M.D.   On: 07/22/2019 21:14   Dg Hip Unilat W Or Wo Pelvis 2-3 Views Right  Result Date: 07/22/2019 CLINICAL DATA:  84 year old female status post fall with right hip pain. EXAM: DG HIP (WITH OR WITHOUT PELVIS) 2-3V RIGHT COMPARISON:  CT Abdomen and Pelvis 05/25/2019 and earlier. FINDINGS: Partially  visible multilevel thoracic vertebral augmentation. Femoral heads remain normally located. Asymmetric right hip joint space loss redemonstrated. The pelvis and SI joints appear stable and intact. Grossly intact proximal left femur. The proximal right femur appears intact. IMPRESSION: No acute fracture or dislocation identified about the right hip or pelvis. Electronically Signed   By: Genevie Ann M.D.   On: 07/22/2019 21:12    Positive ROS: All other systems have been reviewed and were otherwise negative with the exception of those mentioned in the HPI and as above.  Physical Exam: General: Alert, no acute distress Cardiovascular: No pedal edema Respiratory: No cyanosis, no use of accessory musculature GI: No organomegaly, abdomen is soft and non-tender Skin: No lesions in the area of chief complaint Neurologic: Sensation intact distally Psychiatric: Patient is competent for consent with  normal mood and affect Lymphatic: No axillary or cervical lymphadenopathy  MUSCULOSKELETAL: right leg pain with rotation, externally rotated. Compartments soft. Good cap refill. Motor and sensory intact distally.  Assessment: Right hip intertrochanteric fracture  Plan: Plan a trochanteric femoral nail on the right side today.  The diagnosis, risks, benefits and alternatives to treatment are all discussed in detail with the patient and family. Risks include but are not limited to bleeding, infection, deep vein thrombosis, pulmonary embolism, nerve or vascular injury, non-union, repeat operation, persistent pain, weakness, stiffness and death. She understands and is eager to proceed.     Lovell Sheehan, MD    07/23/2019 11:16 AM

## 2019-07-23 NOTE — H&P (Signed)
[name] NAME: Veronica Hubbard    MR#:  [id number]  DATE OF BIRTH:  25-Nov-1934  DATE OF ADMISSION:  07/22/2019  PRIMARY CARE PHYSICIAN: Zhou-Talbert, Elwyn Lade, MD   REQUESTING/REFERRING PHYSICIAN: Brenton Grills, MD  CHIEF COMPLAINT:   Chief Complaint  Patient presents with   Fall    HISTORY OF PRESENT ILLNESS:  84 y.o. 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  Patient states she got up to switch from one chair to the other when she suddenly missed a step and fell landing on her right hip. Denies hitting her head or losing consciousness.  On arrival to the ED, She was afebrile with blood pressure 224/114 mm Hg and pulse rate 81 beats/min. There were no focal neurological deficits; he was alert and oriented x4, and he did not demonstrate any memory deficits.  Continues to complain of right hip pain radiating to her right toes.  Pain is worse with movement or repositioning.  Initial labs revealed potassium 3.1 otherwise unremarkable CMP, WBC 23.9 otherwise unremarkable CBC.CT right femur shows undisplaced avulsion fracture of the greater trochanter with extension of the fracture line into the intertrochanteric region.  Patient will be admitted under hospitalist service for further management.   PAST MEDICAL HISTORY:   Past Medical History:  Diagnosis Date   Anemia    Anxiety    Aortic anomaly    stenosis   Asthma    Cancer ([place]) 02/2018   bone marrow cancer   Chronic kidney disease    COPD (chronic obstructive pulmonary disease) ([place])    Coronary artery disease    Depression    GERD (gastroesophageal reflux  disease)    Hypertension    Hypothyroidism    Personal history of chemotherapy    Spinal stenosis     PAST SURGICAL HISTORY:   Past Surgical History:  Procedure Laterality Date   ABDOMINAL HYSTERECTOMY     BACK SURGERY     BREAST BIOPSY Left 2003   neg   cataracts     KYPHOPLASTY N/A 04/22/2019   Procedure: KYPHOPLASTY;  Surgeon: Hessie Knows, MD;  Location: [facility] ORS;  Service: Orthopedics;  Laterality: N/A;   KYPHOPLASTY N/A 04/10/2019   Procedure: KYPHOPLASTY L3;  Surgeon: Hessie Knows, MD;  Location: [facility] ORS;  Service: Orthopedics;  Laterality: N/A;   KYPHOPLASTY N/A 05/13/2019   Procedure: KYPHOPLASTY L1 L2;  Surgeon: Hessie Knows, MD;  Location: [facility] OR[facility];  Service: Orthopedics;  Laterality: N/A;   KYPHOPLASTY N/A 06/19/2019   Procedure: L5 KYPHOPLASTY;  Surgeon: Hessie Knows, MD;  Location: [facility] ORS;  Service: Orthopedics;  Laterality: N/A;   KYPHOPLASTY N/A 07/04/2019   Procedure: T12 KYPHOPLASTY;  Surgeon: Hessie Knows, MD;  Location: [facility] ORS;  Service: Orthopedics;  Laterality: N/A;   lipoma removal     SHOULDER SURGERY      SOCIAL HISTORY:   Social History   Tobacco Use   Smoking status: Current Every Day Smoker    Types: Cigarettes   Smokeless tobacco: Never Used  Substance Use Topics   Alcohol use: No    FAMILY HISTORY:   Family History  Problem Relation Age of Onset   Leukemia Mother  CAD Father    Diabetes Father    Breast cancer Sister 83    DRUG ALLERGIES:   Allergies  Allergen Reactions   Aspirin Shortness Of Breath   Celecoxib Shortness Of Breath   Iodinated Diagnostic Agents Other (See Comments)    Reaction:  Decreased kidney function   Losartan Itching   Morphine Nausea And Vomiting   Verapamil Itching    REVIEW OF SYSTEMS:   Review of Systems  Constitutional: Negative for chills, fever, malaise/fatigue and weight loss.  HENT: Negative for congestion, hearing loss and sore throat.   Eyes: Negative  for blurred vision and double vision.  Respiratory: Negative for cough, shortness of breath and wheezing.   Cardiovascular: Negative for chest pain, palpitations, orthopnea and leg swelling.  Gastrointestinal: Negative for abdominal pain, diarrhea, nausea and vomiting.  Genitourinary: Negative for dysuria and urgency.  Musculoskeletal: Positive for back pain, falls and joint pain. Negative for myalgias.  Skin: Negative for rash.  Neurological: Negative for dizziness, sensory change, speech change, focal weakness and headaches.  Psychiatric/Behavioral: Positive for depression. The patient is nervous/anxious.    MEDICATIONS AT HOME:   Prior to Admission medications   Medication Sig Start Date End Date Taking? Authorizing Provider  acetaminophen (TYLENOL) 325 MG tablet Take 2 tablets (650 mg total) by mouth every 6 (six) hours as needed for mild pain (or Fever >/= 101). 04/27/19  Yes Mayo, Pete Pelt, MD  albuterol (PROVENTIL) (2.5 MG/3ML) 0.083% nebulizer solution Take 2.5 mg by nebulization every 6 (six) hours as needed for wheezing or shortness of breath.   Yes [provider]  alendronate (FOSAMAX) 70 MG tablet Take 70 mg by mouth every Monday. Take with a full glass of water on an empty stomach.   Yes [provider]  allopurinol (ZYLOPRIM) 100 MG tablet Take 200 mg by mouth daily. 05/04/18  Yes [provider]  azithromycin (ZITHROMAX) 250 MG tablet Take 250 mg by mouth every Monday, Wednesday, and Friday.   Yes [provider]  Cholecalciferol (VITAMIN D3) 25 MCG (1000 UT) CAPS Take 1,000 Units by mouth daily.   Yes [provider]  citalopram (CELEXA) 20 MG tablet Take 20 mg by mouth daily.   Yes [provider]  cyclobenzaprine (FLEXERIL) 5 MG tablet Take 5 mg by mouth 3 (three) times daily.    Yes [provider]  hydrALAZINE (APRESOLINE) 50 MG tablet Take 1 tablet (50 mg total) by mouth every 8 (eight) hours. 04/27/19  Yes Mayo,  Pete Pelt, MD  isosorbide mononitrate (IMDUR) 30 MG 24 hr tablet Take 30 mg by mouth daily.   Yes [provider]  levothyroxine (SYNTHROID) 150 MCG tablet Take 150 mcg by mouth daily before breakfast.    Yes [provider]  Melatonin 5 MG TABS Take 5 mg by mouth at bedtime.   Yes [provider]  metoCLOPramide (REGLAN) 5 MG tablet Take 1 tablet (5 mg total) by mouth every 8 (eight) hours as needed for nausea or refractory nausea / vomiting. 05/26/19  Yes Lang Snow, NP  metoprolol tartrate (LOPRESSOR) 25 MG tablet Take 1 tablet (25 mg total) by mouth 2 (two) times daily. 05/19/19  Yes Vaughan Basta, MD  mirtazapine (REMERON) 7.5 MG tablet Take 1 tablet by mouth daily. 07/10/19  Yes [provider]  Omega-3 Fatty Acids (FISH OIL) 1000 MG CAPS Take 1,000 mg by mouth at bedtime.   Yes [provider]  oxyCODONE-acetaminophen (PERCOCET) 5-325 MG tablet Take 1  tablet by mouth every 4 (four) hours as needed for severe pain. 07/04/19 07/03/20 Yes Hessie Knows, MD  pantoprazole (PROTONIX) 40 MG tablet Take 40 mg by mouth 2 (two) times daily.   Yes [provider]  polyethylene glycol (MIRALAX / GLYCOLAX) 17 g packet Take 17 g by mouth daily as needed. Patient taking differently: Take 17 g by mouth daily as needed (constipation.).  04/27/19  Yes Mayo, Pete Pelt, MD  TRELEGY ELLIPTA 100-62.5-25 MCG/INH AEPB Inhale 1 puff into the lungs at bedtime.   Yes [provider]  triamcinolone cream (KENALOG) 0.5 % Apply 1 application topically 2 (two) times daily as needed (skin rash).    Yes [provider]  lidocaine (LIDODERM) 5 % Place 1 patch onto the skin every 12 (twelve) hours. Remove & Discard patch within 12 hours or as directed by MD 07/10/19 07/09/20  Merlyn Lot, MD  oxyCODONE-acetaminophen (PERCOCET) 7.5-325 MG tablet Take 1 tablet by mouth every 6 (six) hours as needed for moderate pain or severe pain. Patient  not taking: Reported on 07/23/2019 05/19/19   Vaughan Basta, MD  oxyCODONE-acetaminophen (PERCOCET) 7.5-325 MG tablet Take 1 tablet by mouth every 4 (four) hours as needed for severe pain. Patient not taking: Reported on 07/23/2019 06/19/19 06/18/20  Hessie Knows, MD  sertraline (ZOLOFT) 25 MG tablet Take 1 tablet (25 mg total) by mouth daily. Patient not taking: Reported on 07/23/2019 05/20/19   Vaughan Basta, MD      VITAL SIGNS:  Blood pressure (!) 175/98, pulse 64, temperature 98.3 F (36.8 C), temperature source Oral, resp. rate 20, height 5' 3"  (1.6 m), weight 52.1 kg, SpO2 99 %.  PHYSICAL EXAMINATION:   Physical Exam  GENERAL:  84 y.o.-year-old patient lying in the bed with no acute distress.  EYES: Pupils equal, round, reactive to light and accommodation. No scleral icterus. Extraocular muscles intact.  HEENT: Head atraumatic, normocephalic. Oropharynx and nasopharynx clear.  NECK:  Supple, no jugular venous distention. No thyroid enlargement, no tenderness.  LUNGS: Normal breath sounds bilaterally, no wheezing, rales,rhonchi or crepitation. No use of accessory muscles of respiration.  CARDIOVASCULAR: S1, S2 normal. No murmurs, rubs, or gallops.  ABDOMEN: Soft, nontender, nondistended. Bowel sounds present. No organomegaly or mass.  EXTREMITIES: No pedal edema, cyanosis, or clubbing. No rash or lesions. + pedal pulses MUSCULOSKELETAL: Normal bulk, and power was 5+ grip and elbow, knee, and ankle flexion and extension bilaterally.  NEUROLOGIC:Alert and oriented x 3. CN 2-12 intact.  Sensation intact.  Able to break gravity in all extremities except unable to move right lower extremity due to trauma.  Gait not tested due to safety concern. PSYCHIATRIC: The patient is alert and oriented x 3.  SKIN: No obvious rash, lesion, or ulcer.  Multiple skin bruising  DATA REVIEWED:  LABORATORY PANEL:   CBC Recent Labs  Lab 07/22/19 2022  WBC 23.9*  HGB 12.8  HCT 40.6    PLT 357   ------------------------------------------------------------------------------------------------------------------  Chemistries  Recent Labs  Lab 07/22/19 2022  NA 138  K 3.1*  CL 105  CO2 24  GLUCOSE 117*  BUN 14  CREATININE 0.87  CALCIUM 9.8   ------------------------------------------------------------------------------------------------------------------  Cardiac Enzymes No results for input(s): TROPONINI in the last 168 hours. ------------------------------------------------------------------------------------------------------------------  RADIOLOGY:  Dg Chest 1 View  Result Date: 07/22/2019 CLINICAL DATA:  Status post fall today EXAM: CHEST  1 VIEW COMPARISON:  May 02, 2019 FINDINGS: The heart size and mediastinal contours are stable. Aneurysm dilatation of the descending aorta  is unchanged. Both lungs are clear. The visualized skeletal structures are unremarkable. IMPRESSION: No active cardiopulmonary disease. Electronically Signed   By: Abelardo Diesel M.D.   On: 07/22/2019 21:13   Ct Head Wo Contrast  Result Date: 07/22/2019 CLINICAL DATA:  84 year old female status post fall landing on right hip today. EXAM: CT HEAD WITHOUT CONTRAST CT CERVICAL SPINE WITHOUT CONTRAST TECHNIQUE: Multidetector CT imaging of the head and cervical spine was performed following the standard protocol without intravenous contrast. Multiplanar CT image reconstructions of the cervical spine were also generated. COMPARISON:  Head CT 05/25/2019. FINDINGS: CT HEAD FINDINGS Brain: Chronic lacunar infarcts along the right caudate, caudothalamic groove, and anterior external capsule. Small chronic left superior cerebellar infarct. Stable gray-white matter differentiation throughout the brain. No midline shift, ventriculomegaly, mass effect, evidence of mass lesion, intracranial hemorrhage or evidence of cortically based acute infarction. Vascular: Calcified atherosclerosis at the skull base. No  suspicious intracranial vascular hyperdensity. Skull: Intact. Sinuses/Orbits: Visualized paranasal sinuses and mastoids are stable and well pneumatized. Other: No orbit or scalp soft tissue injury. CT CERVICAL SPINE FINDINGS Alignment: Straightening and mild reversal of cervical lordosis. Cervicothoracic junction alignment is within normal limits. Bilateral posterior element alignment is within normal limits. Skull base and vertebrae: Visualized skull base is intact. No atlanto-occipital dissociation. No acute osseous abnormality identified. Soft tissues and spinal canal: No prevertebral fluid or swelling. No visible canal hematoma. Bulky bilateral calcified carotid artery atherosclerosis. Partially retropharyngeal course of the left carotid. Disc levels: Widespread cervical disc, endplate, and facet degeneration. Up to mild degenerative spinal stenosis. Upper chest: Visible upper thoracic levels appear intact. Negative lung apices. Calcified proximal great vessels. IMPRESSION: 1. No acute traumatic injury identified in the head or cervical spine. 2. Stable non contrast CT appearance of the brain with chronic small vessel ischemia. 3. Widespread cervical spine degeneration. 4. Calcified atherosclerosis of the proximal great vessels and both carotids. Electronically Signed   By: Genevie Ann M.D.   On: 07/22/2019 21:10   Ct Cervical Spine Wo Contrast  Result Date: 07/22/2019 CLINICAL DATA:  84 year old female status post fall landing on right hip today. EXAM: CT HEAD WITHOUT CONTRAST CT CERVICAL SPINE WITHOUT CONTRAST TECHNIQUE: Multidetector CT imaging of the head and cervical spine was performed following the standard protocol without intravenous contrast. Multiplanar CT image reconstructions of the cervical spine were also generated. COMPARISON:  Head CT 05/25/2019. FINDINGS: CT HEAD FINDINGS Brain: Chronic lacunar infarcts along the right caudate, caudothalamic groove, and anterior external capsule. Small chronic  left superior cerebellar infarct. Stable gray-white matter differentiation throughout the brain. No midline shift, ventriculomegaly, mass effect, evidence of mass lesion, intracranial hemorrhage or evidence of cortically based acute infarction. Vascular: Calcified atherosclerosis at the skull base. No suspicious intracranial vascular hyperdensity. Skull: Intact. Sinuses/Orbits: Visualized paranasal sinuses and mastoids are stable and well pneumatized. Other: No orbit or scalp soft tissue injury. CT CERVICAL SPINE FINDINGS Alignment: Straightening and mild reversal of cervical lordosis. Cervicothoracic junction alignment is within normal limits. Bilateral posterior element alignment is within normal limits. Skull base and vertebrae: Visualized skull base is intact. No atlanto-occipital dissociation. No acute osseous abnormality identified. Soft tissues and spinal canal: No prevertebral fluid or swelling. No visible canal hematoma. Bulky bilateral calcified carotid artery atherosclerosis. Partially retropharyngeal course of the left carotid. Disc levels: Widespread cervical disc, endplate, and facet degeneration. Up to mild degenerative spinal stenosis. Upper chest: Visible upper thoracic levels appear intact. Negative lung apices. Calcified proximal great vessels. IMPRESSION: 1. No acute traumatic  injury identified in the head or cervical spine. 2. Stable non contrast CT appearance of the brain with chronic small vessel ischemia. 3. Widespread cervical spine degeneration. 4. Calcified atherosclerosis of the proximal great vessels and both carotids. Electronically Signed   By: Genevie Ann M.D.   On: 07/22/2019 21:10   Ct Femur Right Wo Contrast  Result Date: 07/22/2019 CLINICAL DATA:  Recent fall with right leg pain, initial encounter EXAM: CT OF THE LOWER RIGHT EXTREMITY WITHOUT CONTRAST TECHNIQUE: Multidetector CT imaging of the right lower extremity was performed according to the standard protocol. COMPARISON:   None. FINDINGS: Bones/Joint/Cartilage Degenerative changes of the right hip joint are noted. Undisplaced avulsion of the greater trochanter is noted with some extension into the intratrochanteric region without significant displacement. No other fracture is noted. Ligaments Suboptimally assessed by CT. Muscles and Tendons Surrounding musculature is within normal limits. Soft tissues Vascular calcifications are noted. No joint effusion is seen. No significant soft tissue abnormality is seen. IMPRESSION: Undisplaced avulsion fracture of the greater trochanter with extension of the fracture line into the intratrochanteric region. No other acute abnormality is noted. Electronically Signed   By: Inez Catalina M.D.   On: 07/22/2019 23:53   Dg Foot Complete Right  Result Date: 07/22/2019 CLINICAL DATA:  Status post fall with right foot pain. EXAM: RIGHT FOOT COMPLETE - 3+ VIEW COMPARISON:  None. FINDINGS: There is no evidence of fracture or dislocation. Plantar calcaneal spur is noted. Soft tissues are unremarkable. IMPRESSION: No acute fracture or dislocation. Electronically Signed   By: Abelardo Diesel M.D.   On: 07/22/2019 21:14   Dg Hip Unilat W Or Wo Pelvis 2-3 Views Right  Result Date: 07/22/2019 CLINICAL DATA:  84 year old female status post fall with right hip pain. EXAM: DG HIP (WITH OR WITHOUT PELVIS) 2-3V RIGHT COMPARISON:  CT Abdomen and Pelvis 05/25/2019 and earlier. FINDINGS: Partially visible multilevel thoracic vertebral augmentation. Femoral heads remain normally located. Asymmetric right hip joint space loss redemonstrated. The pelvis and SI joints appear stable and intact. Grossly intact proximal left femur. The proximal right femur appears intact. IMPRESSION: No acute fracture or dislocation identified about the right hip or pelvis. Electronically Signed   By: Genevie Ann M.D.   On: 07/22/2019 21:12    EKG:  EKG: there are no previous tracings available for comparison.  IMPRESSION AND PLAN:    84 y.o. 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  1.  Right hip undisplaced fracture -secondary to mechanical fall - Admit to MedSurg unit - CT right femur shows undisplaced avulsion fracture of the greater trochanter with extension of the fracture line into the intertrochanteric region - Pain management *-Falls precaution - N.p.o. after midnight for possible surgery - Patient cleared from medical standpoint for surgery  - Orthopedic consult -discussed with Dr. Harlow Mares will see patient in a.m. for possible surgical intervention  2. Hypokalemia -replete and recheck in a.m.  3. Acute on Chronic Leukocytosis-Has a history of MDS/MPN and was on Jakafi in the past. -Follows with [facility] as an outpatient. Underwent bone marrow biopsy in July 2019.  - Work-up per heme-onc  4.Hypertension- stable - continue metoprolol, Hydralazine and Imdur  5.Hypothyroidism. - Continue Synthroid  6.History of depression-Stable. -Continue Celexa.  7.History of asthma/COPD- Stable. -  Continue home inhalers  All the records are reviewed and case discussed with ED provider. Management plans discussed with the patient, family and they are in agreement.  CODE STATUS: DNR  TOTAL TIME TAKING CARE OF THIS PATIENT: 50 minutes.    on 07/23/2019 at 5:43 AM   Rufina Falco, DNP, FNP-BC [facility] Nurse Practitioner Between 7am to 6pm - Pager [phone]  After 6pm go to [email or web address] - password [email or web address]  Office  [phone]  CC: Primary care physician; Zhou-Talbert, Elwyn Lade, MD

## 2019-07-23 NOTE — Consult Note (Signed)
Plan surgery today for intertrochanteric right hip fracture. Full consult note to follow.

## 2019-07-23 NOTE — Anesthesia Procedure Notes (Signed)
Spinal  Patient location during procedure: OR Start time: 07/23/2019 2:35 PM End time: 07/23/2019 2:37 PM Staffing Anesthesiologist: Durenda Hurt, MD Preanesthetic Checklist Completed: patient identified, site marked, surgical consent, pre-op evaluation, timeout performed, IV checked, risks and benefits discussed and monitors and equipment checked Spinal Block Patient position: sitting Prep: ChloraPrep Patient monitoring: heart rate, continuous pulse ox, blood pressure and cardiac monitor Approach: midline Location: L4-5 Injection technique: single-shot Needle Needle type: Whitacre and Introducer  Needle gauge: 24 G Needle length: 9 cm Additional Notes Negative paresthesia. Minimal blood return. Positive free-flowing CSF. Easy aspiration of fluid.  Patient tolerated procedure well, without complications.

## 2019-07-23 NOTE — Anesthesia Procedure Notes (Signed)
Procedure Name: MAC Date/Time: 07/23/2019 2:27 PM Performed by: Lavone Orn, CRNA Pre-anesthesia Checklist: Patient identified, Emergency Drugs available, Suction available, Patient being monitored and Timeout performed Patient Re-evaluated:Patient Re-evaluated prior to induction Oxygen Delivery Method: Simple face mask

## 2019-07-23 NOTE — Op Note (Signed)
DATE OF SURGERY:  07/23/2019  TIME: 4:15 PM  PATIENT NAME:  Veronica Hubbard  AGE: 84 y.o.  PRE-OPERATIVE DIAGNOSIS:  right hip fracture  POST-OPERATIVE DIAGNOSIS:  SAME  PROCEDURE:  INTRAMEDULLARY (IM) NAIL INTERTROCHANTRIC  SURGEON:  Lovell Sheehan  EBL:  [id number] cc  COMPLICATIONS:  None apparent  OPERATIVE IMPLANTS: Synthes trochanteric femoral nail, short  with interlocking helical blade  90 mm, and 30 mm by 5 mm locking bolt  PREOPERATIVE INDICATIONS:  Veronica Hubbard is a 85 y.o. year old who fell and suffered a hip fracture. She was brought into the ER and then admitted and optimized and then elected for surgical intervention.    The risks benefits and alternatives were discussed with the patient including but not limited to the risks of nonoperative treatment, versus surgical intervention including infection, bleeding, nerve injury, malunion, nonunion, hardware prominence, hardware failure, need for hardware removal, blood clots, cardiopulmonary complications, morbidity, mortality, among others, and they were willing to proceed.    OPERATIVE PROCEDURE:  The patient was brought to the operating room and placed in the supine position.  Spinal anesthesia was administered, with a foley. She was placed on the fracture table.  Closed reduction was performed under C-arm guidance. The length of the femur was also measured using fluoroscopy. Time out was then performed after sterile prep and drape. She received preoperative antibiotics.  Incision was made proximal to the greater trochanter. A guidewire was placed in the appropriate position. Confirmation was made on AP and lateral views. The above-named nail was opened. I opened the proximal femur with a reamer. The femoral canal was narrow and required reaming to 11 mm.  Once the nail was completely seated, I placed a guidepin into the femoral head into the center center position through a second incision.  I measured the  length, and then reamed the lateral cortex and up into the head. I then placed the helical blade. Slight compression was applied. Anatomic fixation achieved. Bone quality was good.  I then secured the proximal interlock. Next, the distal locking bolt was attempted through the radiolucent guide, however, placement was not successful. Therefore using a perfect circle technique the distal locking bolt was placed.   I then removed the instruments, and took final C-arm pictures AP and lateral the entire length of the leg. Anatomic reconstruction was achieved, and the wounds were irrigated copiously and closed with Vicryl  followed by staples and dry sterile dressing. Sponge and needle count were correct.   The patient was awakened and returned to PACU in stable and satisfactory condition. There no complications and the patient tolerated the procedure well.  She will be weightbearing as tolerated.    Lovell Sheehan

## 2019-07-23 NOTE — Progress Notes (Signed)
Initial Nutrition Assessment  RD working remotely.  DOCUMENTATION CODES:   Not applicable  INTERVENTION:  Once diet advanced provide Ensure Enlive po TID, each supplement provides 350 kcal and 20 grams of protein.  NUTRITION DIAGNOSIS:   Increased nutrient needs related to post-op healing as evidenced by estimated needs.  GOAL:   Patient will meet greater than or equal to 90% of their needs  MONITOR:   PO intake, Supplement acceptance, Labs, Weight trends, Skin, I & O's  REASON FOR ASSESSMENT:   Malnutrition Screening Tool    ASSESSMENT:   84 year old female with PMHx of HTN, asthma, hypothyroidism, depression, GERD, spinal stenosis, COPD, CAD, anxiety, CKD, chronic leukocytosis secondary to MDS/MPN on [place] in the past who is admitted after mechanical fall with right hip intertrochanteric fracture.   Patient in OR so unable to provide nutrition/weight history at this time. Patient was recently seen remotely by an RD in 05/2019. At that time she was reporting decreased appetite/intake PTA. She reported that she enjoys chocolate Ensure. Per chart patient was 68 kg on 04/10/2019. She is now 52 kg. If weight history in chart is correct she has lost 16 kg (24% body weight) over the past 3 months which is significant for time frame and very concerning. Suspect patient may be malnourished but unable to confirm without completing NFPE.  Medications reviewed and include: allopurinol, azithromycin, vitamin D3 1000 units daily, levothyroxine, Melatonin 5 mg QHS.  Labs reviewed: Potassium 3.3.  NUTRITION - FOCUSED PHYSICAL EXAM:  Unable to complete at this time.  Diet Order:   Diet Order            Diet NPO time specified  Diet effective now             EDUCATION NEEDS:   No education needs have been identified at this time  Skin:  Skin Assessment: Reviewed RN Assessment  Last BM:  07/21/2019 per chart  Height:   Ht Readings from Last 1 Encounters:  07/23/19 5\' 3"  (1.6  m)   Weight:   Wt Readings from Last 1 Encounters:  07/23/19 52 kg   Ideal Body Weight:  52.3 kg  BMI:  Body mass index is 20.31 kg/m.  Estimated Nutritional Needs:   Kcal:  1400-1600  Protein:  70-80 grams  Fluid:  1.4-1.6 L/day  Willey Blade, MS, RD, LDN Office: [phone] Pager: [phone] After Hours/Weekend Pager: [phone]

## 2019-07-23 NOTE — Progress Notes (Signed)
PHARMACIST - PHYSICIAN COMMUNICATION  CONCERNING: P&T Medication Policy Regarding Oral Bisphosphonates  RECOMMENDATION: Your order for alendronate (Fosamax), ibandronate (Boniva), or risedronate (Actonel) has been discontinued at this time.  If the patient's post-hospital medical condition warrants safe use of this class of drugs, please resume the pre-hospital regimen upon discharge.  DESCRIPTION:  Alendronate (Fosamax), ibandronate (Boniva), and risedronate (Actonel) can cause severe esophageal erosions in patients who are unable to remain upright at least 30 minutes after taking this medication.   Since brief interruptions in therapy are thought to have minimal impact on bone mineral density, the [facility] has established that bisphosphonate orders should be routinely discontinued during hospitalization.   To override this safety policy and permit administration of Boniva, Fosamax, or Actonel in the hospital, prescribers must write "DO NOT HOLD" in the comments section when placing the order for this class of medications.   Pernell Dupre, PharmD, BCPS Clinical Pharmacist 07/23/2019 7:07 AM

## 2019-07-24 LAB — GLUCOSE, CAPILLARY: Glucose-Capillary: 129 mg/dL — ABNORMAL HIGH (ref 70–99)

## 2019-07-24 MED ORDER — ENSURE ENLIVE PO LIQD
237.0000 mL | Freq: Three times a day (TID) | ORAL | Status: DC
  Administered 2019-07-25 – 2019-07-28 (×4): 237 mL via ORAL

## 2019-07-24 MED ORDER — POLYETHYLENE GLYCOL 3350 17 G PO PACK
17.0000 g | PACK | Freq: Every day | ORAL | Status: DC
  Administered 2019-07-25 – 2019-07-29 (×5): 17 g via ORAL
  Filled 2019-07-24 (×5): qty 1

## 2019-07-24 NOTE — Progress Notes (Signed)
Physical Therapy Treatment Patient Details Name: Veronica Hubbard MRN: [id number] DOB: 1935-08-24 Today's Date: 07/24/2019    History of Present Illness Pt is an 84 y.o. female s/p R ORIF with IM nailing (07/23/2019), after presenting to ED after a fall at home 09/15. Pt with PMH of HTN, hypothyroidism, chronic leukocytosis secondary to [facility] (chemotherapy) in the past, CAD, COPD, and chronic back pain s/p multi-level kyphoplasties: L1-L2 (05/13/19), L3 (04/10/2019), L3 and L4 (04/22/2019), L5 (07/02/2019) and T12 (07/04/2019).    PT Comments    Pt demos improvement this session in overall level of alertness, though with increasing fatigue and at times closing eyes at end of session. Pt continues to be very anxious with mobility and requiring extra time and frequent encouragement to perform mobility. Pt requiring less assistance compared to AM session performing rolling and supine<>sit with max A, and able to sit edge of bed with min A once upright. Pt will continue to benefit from skilled PT intervention to improve functional mobility and promote return to prior level of function.   Follow Up Recommendations  SNF     Equipment Recommendations  Rolling walker with 5" wheels    Recommendations for Other Services       Precautions / Restrictions Precautions Precautions: Fall;Other (comment) Precaution Comments: Recent history of multiple kyphoplasties Restrictions Weight Bearing Restrictions: Yes RLE Weight Bearing: Weight bearing as tolerated    Mobility  Bed Mobility Overal bed mobility: Needs Assistance Bed Mobility: Supine to Sit;Sit to Supine;Rolling Rolling: Max assist   Supine to sit: Max assist(for trunk and LE management) Sit to supine: Max assist(for trunk and RLE management)   General bed mobility comments: Pt anxious with mobility, requiring frequent encouragement and extra time.  Transfers                 General transfer comment: Deferred transfers  today secondary to patient's anxiety and high pain levels  Ambulation/Gait             General Gait Details: Deferred transfers today secondary to patient's mobility status, anxiety and high pain levels   Stairs             Wheelchair Mobility    Modified Rankin (Stroke Patients Only)       Balance Overall balance assessment: Needs assistance Sitting-balance support: Feet supported;Bilateral upper extremity supported Sitting balance-Leahy Scale: Fair Sitting balance - Comments: Pt able to sit edge of bed with min A.       Standing balance comment: Unable to assess today                            Cognition Arousal/Alertness: Lethargic Behavior During Therapy: Anxious Overall Cognitive Status: Within Functional Limits for tasks assessed                                 General Comments: Anxious and caution with mobility, requiring frequent encouragement and extra time. At end of session, pt closing eyes at times, reporting sleepiness.      Exercises Total Joint Exercises Ankle Circles/Pumps: AROM;Both;20 reps Gluteal Sets: Strengthening;10 reps;Supine Heel Slides: AAROM;Right;10 reps;Supine    General Comments        Pertinent Vitals/Pain Pain Assessment: 0-10 Pain Score: 8  Pain Location: R hip, lateral thigh Pain Descriptors / Indicators: Aching;Moaning Pain Intervention(s): Limited activity within patient's tolerance;Repositioned;Monitored during session    Home Living  Prior Function            PT Goals (current goals can now be found in the care plan section) Acute Rehab PT Goals Patient Stated Goal: to have less pain PT Goal Formulation: With patient Time For Goal Achievement: 08/07/19 Potential to Achieve Goals: Good Progress towards PT goals: Progressing toward goals    Frequency    BID      PT Plan Current plan remains appropriate    Co-evaluation               AM-PAC PT "6 Clicks" Mobility   Outcome Measure  Help needed turning from your back to your side while in a flat bed without using bedrails?: A Lot Help needed moving from lying on your back to sitting on the side of a flat bed without using bedrails?: A Lot Help needed moving to and from a bed to a chair (including a wheelchair)?: Total Help needed standing up from a chair using your arms (e.g., wheelchair or bedside chair)?: Total Help needed to walk in hospital room?: Total Help needed climbing 3-5 steps with a railing? : Total 6 Click Score: 8    End of Session   Activity Tolerance: Patient limited by pain Patient left: in bed;with bed alarm set;with SCD's reapplied;with call bell/phone within reach Nurse Communication: Mobility status(RN notified of BP at end of session) PT Visit Diagnosis: Unsteadiness on feet (R26.81);Difficulty in walking, not elsewhere classified (R26.2);Pain;Muscle weakness (generalized) (M62.81) Pain - Right/Left: Right Pain - part of body: Hip     Time: [id number] PT Time Calculation (min) (ACUTE ONLY): 45 min  Charges:  $Therapeutic Exercise: 8-22 mins $Therapeutic Activity: 23-37 mins                      Petra Kuba, PT, DPT 07/24/19, 5:55 PM

## 2019-07-24 NOTE — TOC Progression Note (Signed)
Transition of Care [facility]) - Progression Note    Patient Details  Name: Veronica Hubbard MRN: [id number] Date of Birth: [birth year]  Transition of Care [facility]) CM/SW [place], RN Phone Number: 07/24/2019, 2:16 PM  Clinical Narrative:     Patient has recently went to [facility] for Rehab She will most likely need a short rehab for the hip fracture FL2 and PASSR completed, bed search pending PT notes PASSR number [id number]      Expected Discharge Plan and Services                                                 Social Determinants of Health (SDOH) Interventions    Readmission Risk Interventions No flowsheet data found.

## 2019-07-24 NOTE — NC FL2 (Signed)
[place] LEVEL OF CARE SCREENING TOOL     IDENTIFICATION  Patient Name: Veronica Hubbard Birthdate: [birth year] Sex: female Admission Date (Current Location): 07/22/2019  [place] and [place] Number:  Engineering geologist and Address:  [facility], [address] [place], [place] [address]      Provider Number: [id number]  Attending Physician Name and Address:  Henreitta Leber, MD  Relative Name and Phone Number:  [place] [phone]    Current Level of Care: Hospital Recommended Level of Care: [place] Prior Approval Number:    Date Approved/Denied:   PASRR Number: [id number]  Discharge Plan: SNF    Current Diagnoses: Patient Active Problem List   Diagnosis Date Noted  . Closed right hip fracture, initial encounter ([place]) 07/23/2019  . MDS (myelodysplastic syndrome) ([place])   . History of compression fracture of spine   . Palliative care by specialist   . Severe back pain 05/20/2019  . Compression fracture of lumbar spine, non-traumatic ([place]) 05/11/2019  . Intractable pain 04/19/2019  . AAA (abdominal aortic aneurysm) without rupture ([place]) 04/09/2017  . Bilateral carotid artery stenosis 04/09/2017  . Thoracic aortic aneurysm without rupture ([place]) 04/09/2017  . Hyponatremia 02/23/2016  . Chest pain 02/23/2016  . Gastroenteritis due to norovirus 02/09/2016    Orientation RESPIRATION BLADDER Height & Weight     Self, Time, Situation, Place  Normal Continent Weight: 52 kg Height:  5\' 3"  (160 cm)  BEHAVIORAL SYMPTOMS/MOOD NEUROLOGICAL BOWEL NUTRITION STATUS      Continent    AMBULATORY STATUS COMMUNICATION OF NEEDS Skin   Extensive Assist Verbally Normal, Surgical wounds                       Personal Care Assistance Level of Assistance  Dressing     Dressing Assistance: Limited assistance     Functional Limitations Info             SPECIAL CARE FACTORS FREQUENCY  PT (By  licensed PT)     PT Frequency: 5 times per week              Contractures Contractures Info: Not present    Additional Factors Info  Allergies, Code Status Code Status Info: DNR Allergies Info: Aspirin, Celecoxib, Iodinated Diagnostic Agents, Losartan, Morphine, Verapamil           Current Medications (07/24/2019):  This is the current hospital active medication list Current Facility-Administered Medications  Medication Dose Route Frequency Provider Last Rate Last Dose  . 0.9 % NaCl with KCl 20 mEq/ L  infusion   Intravenous Continuous Lovell Sheehan, MD 50 mL/hr at 07/24/19 1251    . acetaminophen (TYLENOL) tablet 650 mg  650 mg Oral Q6H PRN Lovell Sheehan, MD      . albuterol (PROVENTIL) (2.5 MG/3ML) 0.083% nebulizer solution 2.5 mg  2.5 mg Nebulization Q6H PRN Lovell Sheehan, MD      . allopurinol (ZYLOPRIM) tablet 200 mg  200 mg Oral Daily Lovell Sheehan, MD   200 mg at 07/24/19 0919  . azithromycin (ZITHROMAX) tablet 250 mg  250 mg Oral Q M,W,F Lovell Sheehan, MD      . Chlorhexidine Gluconate Cloth 2 % PADS 6 each  6 each Topical Q0600 Lovell Sheehan, MD      . cholecalciferol (VITAMIN D3) tablet 1,000 Units  1,000 Units Oral Daily Lovell Sheehan, MD   1,000 Units at 07/24/19  [id number]  . citalopram (CELEXA) tablet 20 mg  20 mg Oral Daily Lovell Sheehan, MD   20 mg at 07/24/19 0918  . cyclobenzaprine (FLEXERIL) tablet 5 mg  5 mg Oral TID Lovell Sheehan, MD   5 mg at 07/24/19 0916  . docusate sodium (COLACE) capsule 100 mg  100 mg Oral BID Lovell Sheehan, MD   100 mg at 07/24/19 [id number]  . enoxaparin (LOVENOX) injection 40 mg  40 mg Subcutaneous Q24H Lovell Sheehan, MD   40 mg at 07/24/19 0834  . feeding supplement (ENSURE ENLIVE) (ENSURE ENLIVE) liquid 237 mL  237 mL Oral TID BM Sainani, Vivek J, MD      . fluticasone furoate-vilanterol (BREO ELLIPTA) 100-25 MCG/INH 1 puff  1 puff Inhalation QHS Lovell Sheehan, MD   1 puff at 07/23/19 2313   And  . umeclidinium bromide  (INCRUSE ELLIPTA) 62.5 MCG/INH 1 puff  1 puff Inhalation QHS Lovell Sheehan, MD   1 puff at 07/23/19 2313  . hydrALAZINE (APRESOLINE) tablet 50 mg  50 mg Oral Q8H Lovell Sheehan, MD   50 mg at 07/24/19 1354  . HYDROcodone-acetaminophen (NORCO/VICODIN) 5-325 MG per tablet 1-2 tablet  1-2 tablet Oral Q6H PRN Lovell Sheehan, MD   1 tablet at 07/24/19 1354  . isosorbide mononitrate (IMDUR) 24 hr tablet 30 mg  30 mg Oral Daily Lovell Sheehan, MD   30 mg at 07/24/19 [id number]  . lactated ringers infusion   Intravenous Continuous Lovell Sheehan, MD 10 mL/hr at 07/23/19 1422    . lactated ringers infusion   Intravenous Continuous Lovell Sheehan, MD      . levothyroxine (SYNTHROID) tablet 150 mcg  150 mcg Oral QAC breakfast Lovell Sheehan, MD   150 mcg at 07/24/19 [phone]  . lidocaine (LIDODERM) 5 % 1 patch  1 patch Transdermal Q12H Lovell Sheehan, MD   1 patch at 07/24/19 0920  . Melatonin TABS 5 mg  5 mg Oral QHS Lovell Sheehan, MD   5 mg at 07/23/19 2314  . metoCLOPramide (REGLAN) tablet 5 mg  5 mg Oral Q8H PRN Lovell Sheehan, MD       Or  . metoCLOPramide (REGLAN) injection 5 mg  5 mg Intravenous Q8H PRN Lovell Sheehan, MD      . metoCLOPramide (REGLAN) tablet 5 mg  5 mg Oral Q8H PRN Lovell Sheehan, MD      . metoprolol tartrate (LOPRESSOR) tablet 25 mg  25 mg Oral BID Lovell Sheehan, MD   25 mg at 07/24/19 0917  . morphine 2 MG/ML injection 0.5 mg  0.5 mg Intravenous Q2H PRN Lovell Sheehan, MD   0.5 mg at 07/24/19 0053  . mupirocin ointment (BACTROBAN) 2 % 1 application  1 application Nasal BID Lovell Sheehan, MD   1 application at [id number] [phone]  . ondansetron (ZOFRAN) tablet 4 mg  4 mg Oral Q6H PRN Lovell Sheehan, MD   4 mg at 07/24/19 0949   Or  . ondansetron (ZOFRAN) injection 4 mg  4 mg Intravenous Q6H PRN Lovell Sheehan, MD      . Derrill Memo ON 07/25/2019] polyethylene glycol (MIRALAX / GLYCOLAX) packet 17 g  17 g Oral Daily Sainani, Vivek J, MD      . tranexamic acid (CYKLOKAPRON) 2,000 mg  in sodium chloride 0.9 % 50 mL Topical Application  [id number] mg Topical Once Lovell Sheehan, MD      .  triamcinolone cream (KENALOG) 0.5 % 1 application  1 application Topical BID PRN Lovell Sheehan, MD         Discharge Medications: Please see discharge summary for a list of discharge medications.  Relevant Imaging Results:  Relevant Lab Results:   Additional Information [id number]  Su Hilt, RN

## 2019-07-24 NOTE — Progress Notes (Signed)
Subjective:  Patient reports pain as moderate.    Objective:   VITALS:   Vitals:   07/24/19 0724 07/24/19 0920 07/24/19 1155 07/24/19 1232  BP: (!) 167/62 (!) 144/70 (!) 145/69 (!) 139/107  Pulse: 67 73 73 70  Resp: 20  20   Temp: 98.7 F (37.1 C)  98 F (36.7 C)   TempSrc: Oral  Oral   SpO2: 97% 99% 99% 96%  Weight:      Height:        PHYSICAL EXAM:  Sensation intact distally Dorsiflexion/Plantar flexion intact Incision: dressing C/D/I Compartment soft  LABS  Results for orders placed or performed during the hospital encounter of 07/22/19 (from the past 24 hour(s))  Glucose, capillary     Status: Abnormal   Collection Time: 07/24/19 11:30 AM  Result Value Ref Range   Glucose-Capillary 129 (H) 70 - 99 mg/dL    Dg Chest 1 View  Result Date: 07/22/2019 CLINICAL DATA:  Status post fall today EXAM: CHEST  1 VIEW COMPARISON:  May 02, 2019 FINDINGS: The heart size and mediastinal contours are stable. Aneurysm dilatation of the descending aorta is unchanged. Both lungs are clear. The visualized skeletal structures are unremarkable. IMPRESSION: No active cardiopulmonary disease. Electronically Signed   By: Abelardo Diesel M.D.   On: 07/22/2019 21:13   Ct Head Wo Contrast  Result Date: 07/22/2019 CLINICAL DATA:  84 year old female status post fall landing on right hip today. EXAM: CT HEAD WITHOUT CONTRAST CT CERVICAL SPINE WITHOUT CONTRAST TECHNIQUE: Multidetector CT imaging of the head and cervical spine was performed following the standard protocol without intravenous contrast. Multiplanar CT image reconstructions of the cervical spine were also generated. COMPARISON:  Head CT 05/25/2019. FINDINGS: CT HEAD FINDINGS Brain: Chronic lacunar infarcts along the right caudate, caudothalamic groove, and anterior external capsule. Small chronic left superior cerebellar infarct. Stable gray-white matter differentiation throughout the brain. No midline shift, ventriculomegaly, mass  effect, evidence of mass lesion, intracranial hemorrhage or evidence of cortically based acute infarction. Vascular: Calcified atherosclerosis at the skull base. No suspicious intracranial vascular hyperdensity. Skull: Intact. Sinuses/Orbits: Visualized paranasal sinuses and mastoids are stable and well pneumatized. Other: No orbit or scalp soft tissue injury. CT CERVICAL SPINE FINDINGS Alignment: Straightening and mild reversal of cervical lordosis. Cervicothoracic junction alignment is within normal limits. Bilateral posterior element alignment is within normal limits. Skull base and vertebrae: Visualized skull base is intact. No atlanto-occipital dissociation. No acute osseous abnormality identified. Soft tissues and spinal canal: No prevertebral fluid or swelling. No visible canal hematoma. Bulky bilateral calcified carotid artery atherosclerosis. Partially retropharyngeal course of the left carotid. Disc levels: Widespread cervical disc, endplate, and facet degeneration. Up to mild degenerative spinal stenosis. Upper chest: Visible upper thoracic levels appear intact. Negative lung apices. Calcified proximal great vessels. IMPRESSION: 1. No acute traumatic injury identified in the head or cervical spine. 2. Stable non contrast CT appearance of the brain with chronic small vessel ischemia. 3. Widespread cervical spine degeneration. 4. Calcified atherosclerosis of the proximal great vessels and both carotids. Electronically Signed   By: Genevie Ann M.D.   On: 07/22/2019 21:10   Ct Cervical Spine Wo Contrast  Result Date: 07/22/2019 CLINICAL DATA:  84 year old female status post fall landing on right hip today. EXAM: CT HEAD WITHOUT CONTRAST CT CERVICAL SPINE WITHOUT CONTRAST TECHNIQUE: Multidetector CT imaging of the head and cervical spine was performed following the standard protocol without intravenous contrast. Multiplanar CT image reconstructions of the cervical spine were also generated.  COMPARISON:  Head  CT 05/25/2019. FINDINGS: CT HEAD FINDINGS Brain: Chronic lacunar infarcts along the right caudate, caudothalamic groove, and anterior external capsule. Small chronic left superior cerebellar infarct. Stable gray-white matter differentiation throughout the brain. No midline shift, ventriculomegaly, mass effect, evidence of mass lesion, intracranial hemorrhage or evidence of cortically based acute infarction. Vascular: Calcified atherosclerosis at the skull base. No suspicious intracranial vascular hyperdensity. Skull: Intact. Sinuses/Orbits: Visualized paranasal sinuses and mastoids are stable and well pneumatized. Other: No orbit or scalp soft tissue injury. CT CERVICAL SPINE FINDINGS Alignment: Straightening and mild reversal of cervical lordosis. Cervicothoracic junction alignment is within normal limits. Bilateral posterior element alignment is within normal limits. Skull base and vertebrae: Visualized skull base is intact. No atlanto-occipital dissociation. No acute osseous abnormality identified. Soft tissues and spinal canal: No prevertebral fluid or swelling. No visible canal hematoma. Bulky bilateral calcified carotid artery atherosclerosis. Partially retropharyngeal course of the left carotid. Disc levels: Widespread cervical disc, endplate, and facet degeneration. Up to mild degenerative spinal stenosis. Upper chest: Visible upper thoracic levels appear intact. Negative lung apices. Calcified proximal great vessels. IMPRESSION: 1. No acute traumatic injury identified in the head or cervical spine. 2. Stable non contrast CT appearance of the brain with chronic small vessel ischemia. 3. Widespread cervical spine degeneration. 4. Calcified atherosclerosis of the proximal great vessels and both carotids. Electronically Signed   By: Genevie Ann M.D.   On: 07/22/2019 21:10   Ct Femur Right Wo Contrast  Result Date: 07/22/2019 CLINICAL DATA:  Recent fall with right leg pain, initial encounter EXAM: CT OF THE  LOWER RIGHT EXTREMITY WITHOUT CONTRAST TECHNIQUE: Multidetector CT imaging of the right lower extremity was performed according to the standard protocol. COMPARISON:  None. FINDINGS: Bones/Joint/Cartilage Degenerative changes of the right hip joint are noted. Undisplaced avulsion of the greater trochanter is noted with some extension into the intratrochanteric region without significant displacement. No other fracture is noted. Ligaments Suboptimally assessed by CT. Muscles and Tendons Surrounding musculature is within normal limits. Soft tissues Vascular calcifications are noted. No joint effusion is seen. No significant soft tissue abnormality is seen. IMPRESSION: Undisplaced avulsion fracture of the greater trochanter with extension of the fracture line into the intratrochanteric region. No other acute abnormality is noted. Electronically Signed   By: Inez Catalina M.D.   On: 07/22/2019 23:53   Dg Foot Complete Right  Result Date: 07/22/2019 CLINICAL DATA:  Status post fall with right foot pain. EXAM: RIGHT FOOT COMPLETE - 3+ VIEW COMPARISON:  None. FINDINGS: There is no evidence of fracture or dislocation. Plantar calcaneal spur is noted. Soft tissues are unremarkable. IMPRESSION: No acute fracture or dislocation. Electronically Signed   By: Abelardo Diesel M.D.   On: 07/22/2019 21:14   Dg Hip Operative Unilat W Or W/o Pelvis Right  Result Date: 07/23/2019 CLINICAL DATA:  84 year old female undergoing ORIF of an intertrochanteric femoral fracture. EXAM: OPERATIVE RIGHT HIP (WITH PELVIS IF PERFORMED) 8 VIEWS TECHNIQUE: Fluoroscopic spot image(s) were submitted for interpretation post-operatively. COMPARISON:  Preoperative radiographs 07/22/2019 FINDINGS: A total of 8 intraoperative saved images were obtained during open reduction and internal fixation of the intertrochanteric femoral neck fracture. The images demonstrate placement of an intramedullary nail with a single distal interlocking screw and a trans  femoral neck gamma nail. No evidence of immediate hardware complication. IMPRESSION: ORIF of intertrochanteric femoral neck fracture without evidence of immediate hardware complication. Electronically Signed   By: Jacqulynn Cadet M.D.   On: 07/23/2019 16:38  Dg Hip Unilat W Or Wo Pelvis 2-3 Views Right  Result Date: 07/22/2019 CLINICAL DATA:  84 year old female status post fall with right hip pain. EXAM: DG HIP (WITH OR WITHOUT PELVIS) 2-3V RIGHT COMPARISON:  CT Abdomen and Pelvis 05/25/2019 and earlier. FINDINGS: Partially visible multilevel thoracic vertebral augmentation. Femoral heads remain normally located. Asymmetric right hip joint space loss redemonstrated. The pelvis and SI joints appear stable and intact. Grossly intact proximal left femur. The proximal right femur appears intact. IMPRESSION: No acute fracture or dislocation identified about the right hip or pelvis. Electronically Signed   By: Genevie Ann M.D.   On: 07/22/2019 21:12    Assessment/Plan: 1 Day Post-Op   Active Problems:   Closed right hip fracture, initial encounter ([place])   Up with therapy Discharge to SNF when medically stable. Ok from ortho standpoint. Weight bearing as tolerated Lovenox for DVT prophylaxis RTC in 2 weeks for staples  Lovell Sheehan , MD 07/24/2019, 12:47 PM

## 2019-07-24 NOTE — Progress Notes (Signed)
OT Cancellation Note  Patient Details Name: Veronica Hubbard MRN: [id number] DOB: December 31, 1934   Cancelled Treatment:    Reason Eval/Treat Not Completed: Other (comment). Order received, chart reviewed. Pt working with PT upon attempt. Will re-attempt OT evaluation at later date/time as pt is available and medically appropriate.  Jeni Salles, MPH, MS, OTR/L ascom [phone] 07/24/19, 3:26 PM

## 2019-07-24 NOTE — Evaluation (Signed)
Physical Therapy Evaluation Patient Details Name: Veronica Hubbard MRN: [id number] DOB: [birth year] Today's Date: 07/24/2019   History of Present Illness  Pt is an 84 y.o. female s/p R ORIF with IM nailing (07/23/2019), after presenting to ED after a fall at home 09/15. Pt with PMH of HTN, hypothyroidism, chronic leukocytosis secondary to [facility] (chemotherapy) in the past, CAD, COPD, and chronic back pain s/p multi-level kyphoplasties: L1-L2 (05/13/19), L3 (04/10/2019), L3 and L4 (04/22/2019), L5 (07/02/2019) and T12 (07/04/2019).  Clinical Impression  Pt is an 84 y/o female s/p R ORIF with IM nailing for avulsion fracture of greater trochanter extending into intratrochanteric region. Pt is at times throughout session lethargic, closing eyes and becoming distracted, and has difficulty providing subjective history secondary to aforementioned as well as pain and reports of nausea. Pt's participation improves some following repositioning in bed for increased comfort, but is very anxious with attempts at mobility. Pt requires max assist for scooting in bed for repositioning and max assist +2 today for supine<>sit for management of trunk and LEs; pt also assisted with rolling to position bed pan for BM. Cuing provided for bed mobility for setup and sequencing with pt having difficulty to follow secondary to anxiety/pain, requiring frequent encouragement throughout. Pt with elevated BP noted at end of session today; RN notified. Of note, patient with recent history of multiple kyphoplasties at T12 and throughout lumbar spine, though patient with no reports of back pain today.       Follow Up Recommendations SNF    Equipment Recommendations  Rolling walker with 5" wheels    Recommendations for Other Services OT consult     Precautions / Restrictions Precautions Precautions: Fall;Back Precaution Comments: Recent history of multiple kyphoplasties Restrictions Weight Bearing Restrictions: Yes RLE  Weight Bearing: Weight bearing as tolerated      Mobility  Bed Mobility Overal bed mobility: Needs Assistance Bed Mobility: Rolling, Supine to sit, sit to supine    Rolling: Max assist (secondary to pain) Supine to sit: +2 for physical assistance;Max assist(for management of trunk and LEs) Sit to supine: +2 for physical assistance;Max assist(for management of trunk and LEs)   General bed mobility comments: Performed scooting to top of bed for improved positioning in bed, with pt able to assist minimally with LLE. Pt anxious with mobility, requiring frequent encouragement, and moaning at times. Requiring incr assistance with rolling secondary to anxiety and pain; pt performs rolling for placement of bed pan.  Transfers                 General transfer comment: Deferred transfers today secondary to patient's anxiety and high pain levels  Ambulation/Gait             General Gait Details: Deferred transfers today secondary to patient's mobility status, anxiety and high pain levels  Stairs            Wheelchair Mobility    Modified Rankin (Stroke Patients Only)       Balance Overall balance assessment: Needs assistance Sitting-balance support: Feet supported;Bilateral upper extremity supported Sitting balance-Leahy Scale: Poor Sitting balance - Comments: Pt tends to lean posteriorly and to right side, requiring constant support to maintain upright position. Postural control: Right lateral lean;Posterior lean     Standing balance comment: Deferred today                             Pertinent Vitals/Pain Pain Assessment: 0-10 Pain Score: 7  Pain Location: R hip/lateral thigh Pain Descriptors / Indicators: Aching;Moaning Pain Intervention(s): Limited activity within patient's tolerance;Utilized relaxation techniques;Repositioned;Monitored during session    Home Living Family/patient expects to be discharged to:: Private residence Living  Arrangements: Other relatives(pt reports her sister is staying with her currently) Available Help at Discharge: Family(sister) Type of Home: House         Home Equipment: Walker - 4 wheels Additional Comments: Pt with difficulty providing subjective history and requires frequent redirection secondary to pain and at times lethargy.    Prior Function           Comments: Pt with difficulty providing subjective history and requires frequent redirection secondary to pain and at times lethargy.     Hand Dominance        Extremity/Trunk Assessment   Upper Extremity Assessment Upper Extremity Assessment: Overall WFL for tasks assessed    Lower Extremity Assessment Lower Extremity Assessment: LLE deficits/detail;Generalized weakness LLE Deficits / Details: LLE grossly 3/5 and range of motion within normal limits       Communication   Communication: Other (comment)(Pt with difficulty providing subjective history and requires frequent redirection secondary to pain and at times lethargy.)  Cognition Arousal/Alertness: Lethargic(Closing eyes at times during session - possibly secondary to pain) Behavior During Therapy: Anxious Overall Cognitive Status: Within Functional Limits for tasks assessed                                 General Comments: Alert and oriented x4 but at times lethargic, closing eyes, and requiring redirection to answer questions - moaning with pain and anxious with mobility.      General Comments      Exercises Total Joint Exercises Heel Slides: AAROM;Right;Supine;10 reps Hip ABduction/ADduction: PROM;Right;Supine;10 reps   Assessment/Plan    PT Assessment Patient needs continued PT services  PT Problem List Decreased strength;Decreased mobility;Decreased range of motion;Decreased activity tolerance;Decreased balance;Pain       PT Treatment Interventions DME instruction;Therapeutic exercise;Gait training;Balance training;Manual  techniques;Neuromuscular re-education;Functional mobility training;Therapeutic activities;Patient/family education    PT Goals (Current goals can be found in the Care Plan section)  Acute Rehab PT Goals Patient Stated Goal: to have less pain PT Goal Formulation: With patient Time For Goal Achievement: 08/07/19 Potential to Achieve Goals: Fair    Frequency BID   Barriers to discharge        Co-evaluation               AM-PAC PT "6 Clicks" Mobility  Outcome Measure Help needed turning from your back to your side while in a flat bed without using bedrails?: A Lot Help needed moving from lying on your back to sitting on the side of a flat bed without using bedrails?: Total Help needed moving to and from a bed to a chair (including a wheelchair)?: Total Help needed standing up from a chair using your arms (e.g., wheelchair or bedside chair)?: Total Help needed to walk in hospital room?: Total Help needed climbing 3-5 steps with a railing? : Total 6 Click Score: 7    End of Session   Activity Tolerance: Patient limited by pain;Patient limited by lethargy Patient left: in bed;with bed alarm set;with nursing/sitter in room Nurse Communication: Mobility status;Other (comment)(RN notified of elevated BP at end of session as well as nausea, high pain levels and mobility status) PT Visit Diagnosis: Unsteadiness on feet (R26.81);Muscle weakness (generalized) (M62.81);History of falling (Z91.81);Difficulty in walking,  not elsewhere classified (R26.2);Pain Pain - Right/Left: Right Pain - part of body: Hip    Time: [id number] PT Time Calculation (min) (ACUTE ONLY): 54 min   Charges:   PT Evaluation $PT Eval Moderate Complexity: 1 Mod PT Treatments $Therapeutic Exercise: 8-22 mins $Therapeutic Activity: 8-22 mins      Petra Kuba, PT, DPT 07/24/19, 2:40 PM

## 2019-07-24 NOTE — Progress Notes (Signed)
[facility] [place] NAME: Veronica Hubbard    MR#:  [id number]  DATE OF BIRTH:  28-May-1935  SUBJECTIVE:   Patient presents to the hospital after a fall and noted to have a right hip fracture.  Patient is status post right hip intra-medullary pinning postop day #1 today.  Patient still complaining of pain in the right hip/leg area.  She also can complains of intermittent nausea and has a poor appetite.  REVIEW OF SYSTEMS:    Review of Systems  Constitutional: Negative for chills and fever.  HENT: Negative for congestion and tinnitus.   Eyes: Negative for blurred vision and double vision.  Respiratory: Negative for cough, shortness of breath and wheezing.   Cardiovascular: Negative for chest pain, orthopnea and PND.  Gastrointestinal: Positive for nausea. Negative for abdominal pain, diarrhea and vomiting.  Genitourinary: Negative for dysuria and hematuria.  Musculoskeletal: Positive for falls and joint pain (Right HI).  Neurological: Negative for dizziness, sensory change and focal weakness.  All other systems reviewed and are negative.   Nutrition: Regular Tolerating Diet: Yes Tolerating PT: Await Eval.    DRUG ALLERGIES:   Allergies  Allergen Reactions   Aspirin Shortness Of Breath   Celecoxib Shortness Of Breath   Iodinated Diagnostic Agents Other (See Comments)    Reaction:  Decreased kidney function   Losartan Itching   Morphine Nausea And Vomiting   Verapamil Itching    VITALS:  Blood pressure (!) 139/107, pulse 70, temperature 98 F (36.7 C), temperature source Oral, resp. rate 20, height 5\' 3"  (1.6 m), weight 52 kg, SpO2 96 %.  PHYSICAL EXAMINATION:   Physical Exam  GENERAL:  84 y.o.-year-old patient lying in bed in no acute distress.  EYES: Pupils equal, round, reactive to light and accommodation. No scleral icterus. Extraocular muscles intact.  HEENT: Head atraumatic, normocephalic. Oropharynx and  nasopharynx clear.  NECK:  Supple, no jugular venous distention. No thyroid enlargement, no tenderness.  LUNGS: Normal breath sounds bilaterally, no wheezing, rales, rhonchi. No use of accessory muscles of respiration.  CARDIOVASCULAR: S1, S2 normal. No murmurs, rubs, or gallops.  ABDOMEN: Soft, nontender, nondistended. Bowel sounds present. No organomegaly or mass.  EXTREMITIES: No cyanosis, clubbing or edema b/l. Right hip/leg dressing in place with no acute drainage.   NEUROLOGIC: Cranial nerves II through XII are intact. No focal Motor or sensory deficits b/l.   PSYCHIATRIC: The patient is alert and oriented x 3.  SKIN: No obvious rash, lesion, or ulcer.    LABORATORY PANEL:   CBC Recent Labs  Lab 07/23/19 0615  WBC 24.5*  HGB 12.7  HCT 39.7  PLT 338   ------------------------------------------------------------------------------------------------------------------  Chemistries  Recent Labs  Lab 07/23/19 0615 07/23/19 1119  NA 140  --   K 2.8* 3.3*  CL 106  --   CO2 26  --   GLUCOSE 104*  --   BUN 12  --   CREATININE 0.79  --   CALCIUM 9.6  --   MG 1.9  --    ------------------------------------------------------------------------------------------------------------------  Cardiac Enzymes No results for input(s): TROPONINI in the last 168 hours. ------------------------------------------------------------------------------------------------------------------  RADIOLOGY:  Dg Chest 1 View  Result Date: 07/22/2019 CLINICAL DATA:  Status post fall today EXAM: CHEST  1 VIEW COMPARISON:  May 02, 2019 FINDINGS: The heart size and mediastinal contours are stable. Aneurysm dilatation of the descending aorta is unchanged. Both lungs are clear. The visualized skeletal structures are unremarkable. IMPRESSION: No  active cardiopulmonary disease. Electronically Signed   By: Abelardo Diesel M.D.   On: 07/22/2019 21:13   Ct Head Wo Contrast  Result Date: 07/22/2019 CLINICAL  DATA:  84 year old female status post fall landing on right hip today. EXAM: CT HEAD WITHOUT CONTRAST CT CERVICAL SPINE WITHOUT CONTRAST TECHNIQUE: Multidetector CT imaging of the head and cervical spine was performed following the standard protocol without intravenous contrast. Multiplanar CT image reconstructions of the cervical spine were also generated. COMPARISON:  Head CT 05/25/2019. FINDINGS: CT HEAD FINDINGS Brain: Chronic lacunar infarcts along the right caudate, caudothalamic groove, and anterior external capsule. Small chronic left superior cerebellar infarct. Stable gray-white matter differentiation throughout the brain. No midline shift, ventriculomegaly, mass effect, evidence of mass lesion, intracranial hemorrhage or evidence of cortically based acute infarction. Vascular: Calcified atherosclerosis at the skull base. No suspicious intracranial vascular hyperdensity. Skull: Intact. Sinuses/Orbits: Visualized paranasal sinuses and mastoids are stable and well pneumatized. Other: No orbit or scalp soft tissue injury. CT CERVICAL SPINE FINDINGS Alignment: Straightening and mild reversal of cervical lordosis. Cervicothoracic junction alignment is within normal limits. Bilateral posterior element alignment is within normal limits. Skull base and vertebrae: Visualized skull base is intact. No atlanto-occipital dissociation. No acute osseous abnormality identified. Soft tissues and spinal canal: No prevertebral fluid or swelling. No visible canal hematoma. Bulky bilateral calcified carotid artery atherosclerosis. Partially retropharyngeal course of the left carotid. Disc levels: Widespread cervical disc, endplate, and facet degeneration. Up to mild degenerative spinal stenosis. Upper chest: Visible upper thoracic levels appear intact. Negative lung apices. Calcified proximal great vessels. IMPRESSION: 1. No acute traumatic injury identified in the head or cervical spine. 2. Stable non contrast CT appearance  of the brain with chronic small vessel ischemia. 3. Widespread cervical spine degeneration. 4. Calcified atherosclerosis of the proximal great vessels and both carotids. Electronically Signed   By: Genevie Ann M.D.   On: 07/22/2019 21:10   Ct Cervical Spine Wo Contrast  Result Date: 07/22/2019 CLINICAL DATA:  84 year old female status post fall landing on right hip today. EXAM: CT HEAD WITHOUT CONTRAST CT CERVICAL SPINE WITHOUT CONTRAST TECHNIQUE: Multidetector CT imaging of the head and cervical spine was performed following the standard protocol without intravenous contrast. Multiplanar CT image reconstructions of the cervical spine were also generated. COMPARISON:  Head CT 05/25/2019. FINDINGS: CT HEAD FINDINGS Brain: Chronic lacunar infarcts along the right caudate, caudothalamic groove, and anterior external capsule. Small chronic left superior cerebellar infarct. Stable gray-white matter differentiation throughout the brain. No midline shift, ventriculomegaly, mass effect, evidence of mass lesion, intracranial hemorrhage or evidence of cortically based acute infarction. Vascular: Calcified atherosclerosis at the skull base. No suspicious intracranial vascular hyperdensity. Skull: Intact. Sinuses/Orbits: Visualized paranasal sinuses and mastoids are stable and well pneumatized. Other: No orbit or scalp soft tissue injury. CT CERVICAL SPINE FINDINGS Alignment: Straightening and mild reversal of cervical lordosis. Cervicothoracic junction alignment is within normal limits. Bilateral posterior element alignment is within normal limits. Skull base and vertebrae: Visualized skull base is intact. No atlanto-occipital dissociation. No acute osseous abnormality identified. Soft tissues and spinal canal: No prevertebral fluid or swelling. No visible canal hematoma. Bulky bilateral calcified carotid artery atherosclerosis. Partially retropharyngeal course of the left carotid. Disc levels: Widespread cervical disc,  endplate, and facet degeneration. Up to mild degenerative spinal stenosis. Upper chest: Visible upper thoracic levels appear intact. Negative lung apices. Calcified proximal great vessels. IMPRESSION: 1. No acute traumatic injury identified in the head or cervical spine. 2. Stable non contrast CT  appearance of the brain with chronic small vessel ischemia. 3. Widespread cervical spine degeneration. 4. Calcified atherosclerosis of the proximal great vessels and both carotids. Electronically Signed   By: Genevie Ann M.D.   On: 07/22/2019 21:10   Ct Femur Right Wo Contrast  Result Date: 07/22/2019 CLINICAL DATA:  Recent fall with right leg pain, initial encounter EXAM: CT OF THE LOWER RIGHT EXTREMITY WITHOUT CONTRAST TECHNIQUE: Multidetector CT imaging of the right lower extremity was performed according to the standard protocol. COMPARISON:  None. FINDINGS: Bones/Joint/Cartilage Degenerative changes of the right hip joint are noted. Undisplaced avulsion of the greater trochanter is noted with some extension into the intratrochanteric region without significant displacement. No other fracture is noted. Ligaments Suboptimally assessed by CT. Muscles and Tendons Surrounding musculature is within normal limits. Soft tissues Vascular calcifications are noted. No joint effusion is seen. No significant soft tissue abnormality is seen. IMPRESSION: Undisplaced avulsion fracture of the greater trochanter with extension of the fracture line into the intratrochanteric region. No other acute abnormality is noted. Electronically Signed   By: Inez Catalina M.D.   On: 07/22/2019 23:53   Dg Foot Complete Right  Result Date: 07/22/2019 CLINICAL DATA:  Status post fall with right foot pain. EXAM: RIGHT FOOT COMPLETE - 3+ VIEW COMPARISON:  None. FINDINGS: There is no evidence of fracture or dislocation. Plantar calcaneal spur is noted. Soft tissues are unremarkable. IMPRESSION: No acute fracture or dislocation. Electronically Signed    By: Abelardo Diesel M.D.   On: 07/22/2019 21:14   Dg Hip Operative Unilat W Or W/o Pelvis Right  Result Date: 07/23/2019 CLINICAL DATA:  84 year old female undergoing ORIF of an intertrochanteric femoral fracture. EXAM: OPERATIVE RIGHT HIP (WITH PELVIS IF PERFORMED) 8 VIEWS TECHNIQUE: Fluoroscopic spot image(s) were submitted for interpretation post-operatively. COMPARISON:  Preoperative radiographs 07/22/2019 FINDINGS: A total of 8 intraoperative saved images were obtained during open reduction and internal fixation of the intertrochanteric femoral neck fracture. The images demonstrate placement of an intramedullary nail with a single distal interlocking screw and a trans femoral neck gamma nail. No evidence of immediate hardware complication. IMPRESSION: ORIF of intertrochanteric femoral neck fracture without evidence of immediate hardware complication. Electronically Signed   By: Jacqulynn Cadet M.D.   On: 07/23/2019 16:38   Dg Hip Unilat W Or Wo Pelvis 2-3 Views Right  Result Date: 07/22/2019 CLINICAL DATA:  83 year old female status post fall with right hip pain. EXAM: DG HIP (WITH OR WITHOUT PELVIS) 2-3V RIGHT COMPARISON:  CT Abdomen and Pelvis 05/25/2019 and earlier. FINDINGS: Partially visible multilevel thoracic vertebral augmentation. Femoral heads remain normally located. Asymmetric right hip joint space loss redemonstrated. The pelvis and SI joints appear stable and intact. Grossly intact proximal left femur. The proximal right femur appears intact. IMPRESSION: No acute fracture or dislocation identified about the right hip or pelvis. Electronically Signed   By: Genevie Ann M.D.   On: 07/22/2019 21:12     ASSESSMENT AND PLAN:   84 year old female with past medical history of hypertension, hypothyroidism, GERD, coronary artery disease, COPD, anxiety, spinal stenosis who presents to the hospital after mechanical fall and noted to have a right hip fracture.  1.  Status post fall and right hip  fracture- patient is status post right hip intramedullary pinning postop day #1 today. - Continue pain control with as needed Vicodin. -Continue Lovenox for DVT prophylaxis.  Patient will likely need disposition to short-term rehab.  2.  Leukocytosis-patient has history of MDS, possible underlying CLL. -  Continue follow-up as outpatient.  3.  History of gout-no acute attack.  Continue allopurinol.  4.  COPD-no acute exacerbation.  Continue Breo and Incruse Ellipta  5.  Essential hypertension-continue hydralazine, Imdur.  6.  Hypothyroidism-continue Synthroid.  7.  Depression-continue Celexa.     All the records are reviewed and case discussed with Care Management/Social Worker. Management plans discussed with the patient, family and they are in agreement.  CODE STATUS: DNR  DVT Prophylaxis: Lovenox  TOTAL TIME TAKING CARE OF THIS PATIENT: 30 minutes.   POSSIBLE D/C IN 2-3 DAYS, DEPENDING ON CLINICAL CONDITION.   Henreitta Leber M.D on 07/24/2019 at 1:52 PM  Between 7am to 6pm - Pager - [phone]  After 6pm go to [email or web address] - Proofreader  [facility]  Office  [phone]  CC: Primary care physician; Zhou-Talbert, Elwyn Lade, MD

## 2019-07-25 LAB — CBC
HCT: 32.4 % — ABNORMAL LOW (ref 36.0–46.0)
Hemoglobin: 10.2 g/dL — ABNORMAL LOW (ref 12.0–15.0)
MCH: 27.1 pg (ref 26.0–34.0)
MCHC: 31.5 g/dL (ref 30.0–36.0)
MCV: 86.2 fL (ref 80.0–100.0)
Platelets: 349 10*3/uL (ref 150–400)
RBC: 3.76 MIL/uL — ABNORMAL LOW (ref 3.87–5.11)
RDW: 19.8 % — ABNORMAL HIGH (ref 11.5–15.5)
WBC: 26.6 10*3/uL — ABNORMAL HIGH (ref 4.0–10.5)
nRBC: 0.2 % (ref 0.0–0.2)

## 2019-07-25 LAB — BASIC METABOLIC PANEL
Anion gap: 4 — ABNORMAL LOW (ref 5–15)
BUN: 12 mg/dL (ref 8–23)
CO2: 24 mmol/L (ref 22–32)
Calcium: 9.1 mg/dL (ref 8.9–10.3)
Chloride: 108 mmol/L (ref 98–111)
Creatinine, Ser: 0.7 mg/dL (ref 0.44–1.00)
GFR calc Af Amer: >60 mL/min (ref >60)
GFR calc non Af Amer: >60 mL/min (ref >60)
Glucose, Bld: 103 mg/dL — ABNORMAL HIGH (ref 70–99)
Potassium: 4.1 mmol/L (ref 3.5–5.1)
Sodium: 136 mmol/L (ref 135–145)

## 2019-07-25 LAB — MAGNESIUM: Magnesium: 2.2 mg/dL (ref 1.7–2.4)

## 2019-07-25 MED ORDER — ENOXAPARIN SODIUM 40 MG/0.4ML ~~LOC~~ SOLN: 40.0000 mg | SUBCUTANEOUS | Status: AC

## 2019-07-25 MED ORDER — HYDROCODONE-ACETAMINOPHEN 5-325 MG PO TABS: 1.0000 | ORAL_TABLET | Freq: Four times a day (QID) | ORAL | 0 refills | Status: DC | PRN

## 2019-07-25 NOTE — Care Management Important Message (Signed)
Important Message  Patient Details  Name: Veronica Hubbard MRN: [id number] Date of Birth: 11-29-1934   Medicare Important Message Given:  Yes     [name] A Reace Breshears 07/25/2019, 11:12 AM

## 2019-07-25 NOTE — Discharge Summary (Addendum)
[facility] NAME: Veronica Hubbard    MR#:  [id number]  DATE OF BIRTH:  08/05/1935  DATE OF ADMISSION:  07/22/2019 ADMITTING PHYSICIAN: Lang Snow, NP  DATE OF DISCHARGE: 07/29/2019  PRIMARY CARE PHYSICIAN: Zhou-Talbert, Elwyn Lade, MD    ADMISSION DIAGNOSIS:  Right thigh pain [M79.651] Intractable pain [R52] Accident due to mechanical fall without injury, initial encounter [W19.XXXA]  DISCHARGE DIAGNOSIS:  Active Problems:   Closed right hip fracture, initial encounter ([place])   SECONDARY DIAGNOSIS:   Past Medical History:  Diagnosis Date  . Anemia   . Anxiety   . Aortic anomaly    stenosis  . Asthma   . Cancer ([place]) 02/2018   bone marrow cancer  . Chronic kidney disease   . COPD (chronic obstructive pulmonary disease) ([place])   . Coronary artery disease   . Depression   . GERD (gastroesophageal reflux disease)   . Hypertension   . Hypothyroidism   . Personal history of chemotherapy   . Spinal stenosis     HOSPITAL COURSE:   84 year old female with past medical history of hypertension, hypothyroidism, GERD, coronary artery disease, COPD, anxiety, spinal stenosis who presents to the hospital after mechanical fall and noted to have a right hip fracture.  1.  Status post fall and right hip fracture- patient is status post right hip intramedullary pinning postop day #5 today. - pain is well controlled on as needed Tramadol as pt. Was getting N/V with the vicodin.  - tolerating PT well and stable to be discharged to SNF for ongoing rehab.  - cont. Lovenox for DVT prophylaxis for 14 days post-operatively.   2.  Leukocytosis-patient has history of MDS, possible underlying CLL. - no clinically symptoms of fever, nights sweats, weight loss. Cont. Follow up with PCP as outpatient and referral to Oncology if needed.   3.  History of gout-no acute attack.  Continue allopurinol.  4.  COPD-no acute exacerbation.   pt. Will Continue Breo and Incruse Ellipta  5.  Essential hypertension- pt. Will continue hydralazine, Imdur. - BP stable while in the hospital.   6.  Hypothyroidism-continue Synthroid.  7.  Depression-continue Celexa.  8. Constipation - resolved with some Miralax.   DISCHARGE CONDITIONS:   Stable.   CONSULTS OBTAINED:  Treatment Team:  Lovell Sheehan, MD  DRUG ALLERGIES:   Allergies  Allergen Reactions  . Aspirin Shortness Of Breath  . Celecoxib Shortness Of Breath  . Iodinated Diagnostic Agents Other (See Comments)    Reaction:  Decreased kidney function  . Losartan Itching  . Morphine Nausea And Vomiting  . Verapamil Itching    DISCHARGE MEDICATIONS:   Allergies as of 07/25/2019      Reactions   Aspirin Shortness Of Breath   Celecoxib Shortness Of Breath   Iodinated Diagnostic Agents Other (See Comments)   Reaction:  Decreased kidney function   Losartan Itching   Morphine Nausea And Vomiting   Verapamil Itching      Medication List    STOP taking these medications   oxyCODONE-acetaminophen 5-325 MG tablet Commonly known as: Percocet   oxyCODONE-acetaminophen 7.5-325 MG tablet Commonly known as: Percocet   sertraline 25 MG tablet Commonly known as: ZOLOFT     TAKE these medications   acetaminophen 325 MG tablet Commonly known as: TYLENOL Take 2 tablets (650 mg total) by mouth every 6 (six) hours as needed for mild pain (or Fever >/= 101).   albuterol (2.5  MG/3ML) 0.083% nebulizer solution Commonly known as: PROVENTIL Take 2.5 mg by nebulization every 6 (six) hours as needed for wheezing or shortness of breath.   alendronate 70 MG tablet Commonly known as: FOSAMAX Take 70 mg by mouth every Monday. Take with a full glass of water on an empty stomach.   allopurinol 100 MG tablet Commonly known as: ZYLOPRIM Take 200 mg by mouth daily.   azithromycin 250 MG tablet Commonly known as: ZITHROMAX Take 250 mg by mouth every Monday, Wednesday,  and Friday.   citalopram 20 MG tablet Commonly known as: CELEXA Take 20 mg by mouth daily.   cyclobenzaprine 5 MG tablet Commonly known as: FLEXERIL Take 5 mg by mouth 3 (three) times daily.   enoxaparin 40 MG/0.4ML injection Commonly known as: LOVENOX Inject 0.4 mLs (40 mg total) into the skin daily for 28 days.   Fish Oil 1000 MG Caps Take 1,000 mg by mouth at bedtime.   hydrALAZINE 50 MG tablet Commonly known as: APRESOLINE Take 1 tablet (50 mg total) by mouth every 8 (eight) hours.   HYDROcodone-acetaminophen 5-325 MG tablet Commonly known as: NORCO/VICODIN Take 1-2 tablets by mouth every 6 (six) hours as needed for moderate pain.   isosorbide mononitrate 30 MG 24 hr tablet Commonly known as: IMDUR Take 30 mg by mouth daily.   levothyroxine 150 MCG tablet Commonly known as: SYNTHROID Take 150 mcg by mouth daily before breakfast.   lidocaine 5 % Commonly known as: Lidoderm Place 1 patch onto the skin every 12 (twelve) hours. Remove & Discard patch within 12 hours or as directed by MD   Melatonin 5 MG Tabs Take 5 mg by mouth at bedtime.   metoCLOPramide 5 MG tablet Commonly known as: Reglan Take 1 tablet (5 mg total) by mouth every 8 (eight) hours as needed for nausea or refractory nausea / vomiting.   metoprolol tartrate 25 MG tablet Commonly known as: LOPRESSOR Take 1 tablet (25 mg total) by mouth 2 (two) times daily.   mirtazapine 7.5 MG tablet Commonly known as: REMERON Take 1 tablet by mouth daily.   pantoprazole 40 MG tablet Commonly known as: PROTONIX Take 40 mg by mouth 2 (two) times daily.   polyethylene glycol 17 g packet Commonly known as: MIRALAX / GLYCOLAX Take 17 g by mouth daily as needed. What changed: reasons to take this   Trelegy Ellipta 100-62.5-25 MCG/INH Aepb Generic drug: Fluticasone-Umeclidin-Vilant Inhale 1 puff into the lungs at bedtime.   triamcinolone cream 0.5 % Commonly known as: KENALOG Apply 1 application topically 2  (two) times daily as needed (skin rash).   Vitamin D3 25 MCG (1000 UT) Caps Take 1,000 Units by mouth daily.         DISCHARGE INSTRUCTIONS:   DIET:  Cardiac diet  DISCHARGE CONDITION:  Stable  ACTIVITY:  Activity as tolerated  OXYGEN:  Home Oxygen: No.   Oxygen Delivery: room air  DISCHARGE LOCATION:  nursing home   If you experience worsening of your admission symptoms, develop shortness of breath, life threatening emergency, suicidal or homicidal thoughts you must seek medical attention immediately by calling 911 or calling your MD immediately  if symptoms less severe.  You Must read complete instructions/literature along with all the possible adverse reactions/side effects for all the Medicines you take and that have been prescribed to you. Take any new Medicines after you have completely understood and accpet all the possible adverse reactions/side effects.   Please note  You were cared for by  a hospitalist during your hospital stay. If you have any questions about your discharge medications or the care you received while you were in the hospital after you are discharged, you can call the unit and asked to speak with the hospitalist on call if the hospitalist that took care of you is not available. Once you are discharged, your primary care physician will handle any further medical issues. Please note that NO REFILLS for any discharge medications will be authorized once you are discharged, as it is imperative that you return to your primary care physician (or establish a relationship with a primary care physician if you do not have one) for your aftercare needs so that they can reassess your need for medications and monitor your lab values.     Today   Nausea much improved as pt. Switched to Tramadol from Vicodin. Will d/c to SNF today.   VITAL SIGNS:  Blood pressure 131/72, pulse 72, temperature 98.7 F (37.1 C), temperature source Oral, resp. rate 18, height 5\' 3"   (1.6 m), weight 52 kg, SpO2 98 %.  I/O:    Intake/Output Summary (Last 24 hours) at 07/25/2019 1052 Last data filed at 07/25/2019 0343 Gross per 24 hour  Intake -  Output 701 ml  Net -701 ml    PHYSICAL EXAMINATION:   GENERAL:  84 y.o.-year-old patient lying in bed in NAD.   EYES: Pupils equal, round, reactive to light and accommodation. No scleral icterus. Extraocular muscles intact.  HEENT: Head atraumatic, normocephalic. Oropharynx and nasopharynx clear.  NECK:  Supple, no jugular venous distention. No thyroid enlargement, no tenderness.  LUNGS: Normal breath sounds bilaterally, no wheezing, rales, rhonchi. No use of accessory muscles of respiration.  CARDIOVASCULAR: S1, S2 normal. No murmurs, rubs, or gallops.  ABDOMEN: Soft, nontender, nondistended. Bowel sounds present. No organomegaly or mass.  EXTREMITIES: No cyanosis, clubbing or edema b/l. Right hip/leg dressing in place with some bruising around the dressing and staples in place.   NEUROLOGIC: Cranial nerves II through XII are intact. No focal Motor or sensory deficits b/l.   PSYCHIATRIC: The patient is alert and oriented x 3. SKIN: No obvious rash, lesion, or ulcer.   DATA REVIEW:   CBC Recent Labs  Lab 07/25/19 0449  WBC 26.6*  HGB 10.2*  HCT 32.4*  PLT 349    Chemistries  Recent Labs  Lab 07/25/19 0449  NA 136  K 4.1  CL 108  CO2 24  GLUCOSE 103*  BUN 12  CREATININE 0.70  CALCIUM 9.1  MG 2.2    Cardiac Enzymes No results for input(s): TROPONINI in the last 168 hours.  Microbiology Results  Results for orders placed or performed during the hospital encounter of 07/22/19  SARS Coronavirus 2 [facility] order, Performed in [facility] hospital lab) Nasopharyngeal Nasopharyngeal Swab     Status: None   Collection Time: 07/23/19 12:55 AM   Specimen: Nasopharyngeal Swab  Result Value Ref Range Status   SARS Coronavirus 2 NEGATIVE NEGATIVE Final    Comment: (NOTE) If result is NEGATIVE SARS-CoV-2  target nucleic acids are NOT DETECTED. The SARS-CoV-2 RNA is generally detectable in upper and lower  respiratory specimens during the acute phase of infection. The lowest  concentration of SARS-CoV-2 viral copies this assay can detect is 250  copies / mL. A negative result does not preclude SARS-CoV-2 infection  and should not be used as the sole basis for treatment or other  patient management decisions.  A negative result may occur with  improper specimen collection / handling, submission of specimen other  than nasopharyngeal swab, presence of viral mutation(s) within the  areas targeted by this assay, and inadequate number of viral copies  (<250 copies / mL). A negative result must be combined with clinical  observations, patient history, and epidemiological information. If result is POSITIVE SARS-CoV-2 target nucleic acids are DETECTED. The SARS-CoV-2 RNA is generally detectable in upper and lower  respiratory specimens dur ing the acute phase of infection.  Positive  results are indicative of active infection with SARS-CoV-2.  Clinical  correlation with patient history and other diagnostic information is  necessary to determine patient infection status.  Positive results do  not rule out bacterial infection or co-infection with other viruses. If result is PRESUMPTIVE POSTIVE SARS-CoV-2 nucleic acids MAY BE PRESENT.   A presumptive positive result was obtained on the submitted specimen  and confirmed on repeat testing.  While 2019 novel coronavirus  (SARS-CoV-2) nucleic acids may be present in the submitted sample  additional confirmatory testing may be necessary for epidemiological  and / or clinical management purposes  to differentiate between  SARS-CoV-2 and other Sarbecovirus currently known to infect humans.  If clinically indicated additional testing with an alternate test  methodology [phone]) is advised. The SARS-CoV-2 RNA is generally  detectable in upper and lower  respiratory sp ecimens during the acute  phase of infection. The expected result is Negative. Fact Sheet for Patients:  StrictlyIdeas.no Fact Sheet for Healthcare Providers: BankingDealers.co.za This test is not yet approved or cleared by the [place] FDA and has been authorized for detection and/or diagnosis of SARS-CoV-2 by FDA under an Emergency Use Authorization (EUA).  This EUA will remain in effect (meaning this test can be used) for the duration of the COVID-19 declaration under Section 564(b)(1) of the Act, 21 U.S.C. section 360bbb-3(b)(1), unless the authorization is terminated or revoked sooner. Performed at [facility], [address] [place], [place] [address]   MRSA PCR Screening     Status: Abnormal   Collection Time: 07/23/19  3:49 AM   Specimen: Nasopharyngeal  Result Value Ref Range Status   MRSA by PCR POSITIVE (A) NEGATIVE Final    Comment:        The GeneXpert MRSA Assay (FDA approved for NASAL specimens only), is one component of a comprehensive MRSA colonization surveillance program. It is not intended to diagnose MRSA infection nor to guide or monitor treatment for MRSA infections. RESULT CALLED TO, READ BACK BY AND VERIFIED WITH:  Athena Masse AT [id number] 07/23/2019 SDR Performed at [facility], [address] [place], [place] [address]     RADIOLOGY:  Dg Hip Operative Unilat W Or W/o Pelvis Right  Result Date: 07/23/2019 CLINICAL DATA:  84 year old female undergoing ORIF of an intertrochanteric femoral fracture. EXAM: OPERATIVE RIGHT HIP (WITH PELVIS IF PERFORMED) 8 VIEWS TECHNIQUE: Fluoroscopic spot image(s) were submitted for interpretation post-operatively. COMPARISON:  Preoperative radiographs 07/22/2019 FINDINGS: A total of 8 intraoperative saved images were obtained during open reduction and internal fixation of the intertrochanteric femoral neck fracture. The images  demonstrate placement of an intramedullary nail with a single distal interlocking screw and a trans femoral neck gamma nail. No evidence of immediate hardware complication. IMPRESSION: ORIF of intertrochanteric femoral neck fracture without evidence of immediate hardware complication. Electronically Signed   By: Jacqulynn Cadet M.D.   On: 07/23/2019 16:38      Management plans discussed with the patient, family and they are in agreement.  CODE STATUS:     Code Status Orders  (From admission, onward)         Start     Ordered   07/23/19 0007  Do not attempt resuscitation (DNR)  Continuous    Question Answer Comment  In the event of cardiac or respiratory ARREST Do not call a "code blue"   In the event of cardiac or respiratory ARREST Do not perform Intubation, CPR, defibrillation or ACLS   In the event of cardiac or respiratory ARREST Use medication by any route, position, wound care, and other measures to relive pain and suffering. May use oxygen, suction and manual treatment of airway obstruction as needed for comfort.      07/23/19 0010         TOTAL TIME TAKING CARE OF THIS PATIENT: 40 minutes.    Henreitta Leber M.D on 07/25/2019 at 10:52 AM  Between 7am to 6pm - Pager - [phone]  After 6pm go to [email or web address] - Proofreader  [facility]  Office  [phone]  CC: Primary care physician; Zhou-Talbert, Elwyn Lade, MD

## 2019-07-25 NOTE — Evaluation (Signed)
Occupational Therapy Evaluation Patient Details Name: Veronica Hubbard MRN: [id number] DOB: Jun 01, 1935 Today's Date: 07/25/2019    History of Present Illness Pt is an 84 y.o. female s/p R ORIF with IM nailing (07/23/2019), after presenting to ED after a fall at home 09/15. Pt with PMH of HTN, hypothyroidism, chronic leukocytosis secondary to [facility] (chemotherapy) in the past, CAD, COPD, and chronic back pain s/p multi-level kyphoplasties: L1-L2 (05/13/19), L3 (04/10/2019), L3 and L4 (04/22/2019), L5 (07/02/2019) and T12 (07/04/2019).   Clinical Impression   Pt seen for OT evaluation this date, POD#1 from above surgery. Per pt, she required some assist from her sister for LB dressing, bathing, meal prep, cleaning, and med mgt since "feeling sick" for past several weeks prior to admission. Pt reports ambulating with 2WW but didn't have it with her when she fell leading to this admission. Pt is eager to return to PLOF with less pain and improved safety and independence. Pt currently limited by significant R hip/lateral thigh pain with any movement and nauseated. Per chart, pt had not pain medicine since previous night. RN notified of pt's request for pain medicine. Pt currently requires max assist for LB dressing and bathing and toileting from bed level due to pain and limited AROM of R hip. Pt instructed in self care skills and AE/DME. Pt unable to tolerate attempts 2/2 pain.  Pt would benefit from additional instruction in self care skills and techniques to help maintain precautions with or without assistive devices to support recall and carryover prior to discharge. Recommend SNF upon discharge.     Follow Up Recommendations  SNF    Equipment Recommendations  None recommended by OT    Recommendations for Other Services       Precautions / Restrictions Precautions Precautions: Fall;Other (comment) Precaution Comments: Recent history of multiple kyphoplasties Restrictions Weight Bearing  Restrictions: Yes RLE Weight Bearing: Weight bearing as tolerated      Mobility Bed Mobility               General bed mobility comments: pt declines= very pain limited  Transfers                 General transfer comment: unable to attempt 2/2 pain    Balance                                           ADL either performed or assessed with clinical judgement   ADL                                         General ADL Comments: Requires Max A for LB ADL tasks and toileting from bed level     Vision Baseline Vision/History: Wears glasses Wears Glasses: Reading only Patient Visual Report: No change from baseline Vision Assessment?: No apparent visual deficits     Perception     Praxis      Pertinent Vitals/Pain Pain Assessment: 0-10 Pain Score: 9  Pain Location: R hip/lateral thigh with movement Pain Descriptors / Indicators: Aching;Moaning Pain Intervention(s): Limited activity within patient's tolerance;Monitored during session;Repositioned     Hand Dominance Right   Extremity/Trunk Assessment Upper Extremity Assessment Upper Extremity Assessment: Overall WFL for tasks assessed   Lower Extremity Assessment Lower Extremity Assessment: Generalized weakness;RLE deficits/detail RLE  Deficits / Details: very pain limited RLE: Unable to fully assess due to pain   Cervical / Trunk Assessment Cervical / Trunk Assessment: Other exceptions Cervical / Trunk Exceptions: multiple kyphoplasties   Communication Communication Communication: No difficulties   Cognition Arousal/Alertness: Awake/alert;Suspect due to medications Behavior During Therapy: [facility] for tasks assessed/performed Overall Cognitive Status: Within Functional Limits for tasks assessed                                 General Comments: alert and oriented, questionable historian based on chart review, however, pt reports feeling "lost" and "out of  it" 2/2 pain medication   General Comments       Exercises Other Exercises Other Exercises: Pt educated in modifications for LB dressing including AE/DME to improve safety/indep and support pain mgt   Shoulder Instructions      Home Living Family/patient expects to be discharged to:: Private residence Living Arrangements: Other relatives(pt reports her sister is staying with her currently) Available Help at Discharge: Family(sister has been staying with her since she's been sick - few weeks) Type of Home: House Home Access: Ramped entrance;Stairs to enter Entrance Stairs-Number of Steps: 1 step from [place] and has a ramp   Home Layout: One level     Bathroom Shower/Tub: Teacher, early years/pre: [employer]: Environmental consultant - 4 wheels;Bedside commode;Walker - 2 wheels;Shower seat          Prior Functioning/Environment Level of Independence: Needs assistance  Gait / Transfers Assistance Needed: was using 2WW but didn't have it when she fell ADL's / Homemaking Assistance Needed: sometimes needed help with showering, dressing, sister helping with meals, cleaning, med mgt   Comments: 3 falls in past 12 months "I think"        OT Problem List: Decreased strength;Pain;Decreased range of motion;Decreased cognition;Decreased knowledge of use of DME or AE;Impaired balance (sitting and/or standing)      OT Treatment/Interventions: Self-care/ADL training;Therapeutic exercise;Therapeutic activities;DME and/or AE instruction;Patient/family education;Balance training    OT Goals(Current goals can be found in the care plan section) Acute Rehab OT Goals Patient Stated Goal: to have less pain OT Goal Formulation: With patient Time For Goal Achievement: 08/08/19 Potential to Achieve Goals: Good ADL Goals Pt Will Perform Grooming: sitting;with set-up;with supervision Pt Will Transfer to Toilet: with max assist;bedside commode;stand pivot transfer  OT Frequency: Min  2X/week   Barriers to D/C: Decreased caregiver support          Co-evaluation              AM-PAC OT "6 Clicks" Daily Activity     Outcome Measure Help from another person eating meals?: A Little Help from another person taking care of personal grooming?: A Little Help from another person toileting, which includes using toliet, bedpan, or urinal?: A Lot Help from another person bathing (including washing, rinsing, drying)?: A Lot Help from another person to put on and taking off regular upper body clothing?: A Little Help from another person to put on and taking off regular lower body clothing?: A Lot 6 Click Score: 15   End of Session Nurse Communication: Patient requests pain meds  Activity Tolerance: Patient limited by pain Patient left: in bed;with call bell/phone within reach;with bed alarm set  OT Visit Diagnosis: Other abnormalities of gait and mobility (R26.89);Pain Pain - Right/Left: Right Pain - part of body: Hip  Time: [id number] OT Time Calculation (min): 29 min Charges:  OT General Charges $OT Visit: 1 Visit OT Evaluation $OT Eval Moderate Complexity: 1 Mod OT Treatments $Self Care/Home Management : 8-22 mins  Jeni Salles, MPH, MS, OTR/L ascom [phone] 07/25/19, 9:24 AM

## 2019-07-25 NOTE — Progress Notes (Signed)
[place] [facility] [place] NAME: Veronica Hubbard    MR#:  [id number]  DATE OF BIRTH:  12-04-1934  SUBJECTIVE:   Patient presents to the hospital after a fall and noted to have a right hip fracture.  Patient is status post right hip intra-medullary pinning postop day #2 today.  Patient still complaining of pain in the right hip/leg area.  Nausea improved.  Constipated.   REVIEW OF SYSTEMS:    Review of Systems  Constitutional: Negative for chills and fever.  HENT: Negative for congestion and tinnitus.   Eyes: Negative for blurred vision and double vision.  Respiratory: Negative for cough, shortness of breath and wheezing.   Cardiovascular: Negative for chest pain, orthopnea and PND.  Gastrointestinal: Positive for constipation. Negative for abdominal pain, diarrhea, nausea and vomiting.  Genitourinary: Negative for dysuria and hematuria.  Musculoskeletal: Positive for falls and joint pain (Right HI).  Neurological: Negative for dizziness, sensory change and focal weakness.  All other systems reviewed and are negative.   Nutrition: Regular Tolerating Diet: Yes Tolerating PT: Eval noted.    DRUG ALLERGIES:   Allergies  Allergen Reactions   Aspirin Shortness Of Breath   Celecoxib Shortness Of Breath   Iodinated Diagnostic Agents Other (See Comments)    Reaction:  Decreased kidney function   Losartan Itching   Morphine Nausea And Vomiting   Verapamil Itching    VITALS:  Blood pressure 131/72, pulse 72, temperature 98.7 F (37.1 C), temperature source Oral, resp. rate 18, height 5\' 3"  (1.6 m), weight 52 kg, SpO2 98 %.  PHYSICAL EXAMINATION:   Physical Exam  GENERAL:  84 y.o.-year-old patient lying in bed in no acute distress.  EYES: Pupils equal, round, reactive to light and accommodation. No scleral icterus. Extraocular muscles intact.  HEENT: Head atraumatic, normocephalic. Oropharynx and nasopharynx clear.  NECK:   Supple, no jugular venous distention. No thyroid enlargement, no tenderness.  LUNGS: Normal breath sounds bilaterally, no wheezing, rales, rhonchi. No use of accessory muscles of respiration.  CARDIOVASCULAR: S1, S2 normal. No murmurs, rubs, or gallops.  ABDOMEN: Soft, nontender, nondistended. Bowel sounds present. No organomegaly or mass.  EXTREMITIES: No cyanosis, clubbing or edema b/l. Right hip/leg dressing in place with no acute drainage.   NEUROLOGIC: Cranial nerves II through XII are intact. No focal Motor or sensory deficits b/l.   PSYCHIATRIC: The patient is alert and oriented x 3.  SKIN: No obvious rash, lesion, or ulcer.    LABORATORY PANEL:   CBC Recent Labs  Lab 07/25/19 0449  WBC 26.6*  HGB 10.2*  HCT 32.4*  PLT 349   ------------------------------------------------------------------------------------------------------------------  Chemistries  Recent Labs  Lab 07/25/19 0449  NA 136  K 4.1  CL 108  CO2 24  GLUCOSE 103*  BUN 12  CREATININE 0.70  CALCIUM 9.1  MG 2.2   ------------------------------------------------------------------------------------------------------------------  Cardiac Enzymes No results for input(s): TROPONINI in the last 168 hours. ------------------------------------------------------------------------------------------------------------------  RADIOLOGY:  Dg Hip Operative Unilat W Or W/o Pelvis Right  Result Date: 07/23/2019 CLINICAL DATA:  84 year old female undergoing ORIF of an intertrochanteric femoral fracture. EXAM: OPERATIVE RIGHT HIP (WITH PELVIS IF PERFORMED) 8 VIEWS TECHNIQUE: Fluoroscopic spot image(s) were submitted for interpretation post-operatively. COMPARISON:  Preoperative radiographs 07/22/2019 FINDINGS: A total of 8 intraoperative saved images were obtained during open reduction and internal fixation of the intertrochanteric femoral neck fracture. The images demonstrate placement of an intramedullary nail with a  single distal interlocking screw and a  trans femoral neck gamma nail. No evidence of immediate hardware complication. IMPRESSION: ORIF of intertrochanteric femoral neck fracture without evidence of immediate hardware complication. Electronically Signed   By: Jacqulynn Cadet M.D.   On: 07/23/2019 16:38     ASSESSMENT AND PLAN:   84 year old female with past medical history of hypertension, hypothyroidism, GERD, coronary artery disease, COPD, anxiety, spinal stenosis who presents to the hospital after mechanical fall and noted to have a right hip fracture.  1.  Status post fall and right hip fracture- patient is status post right hip intramedullary pinning postop day #2 today. - Continue pain control with as needed Vicodin. -Continue Lovenox for DVT prophylaxis.  Patient will likely need disposition to short-term rehab.  2.  Leukocytosis-patient has history of MDS, possible underlying CLL. - Continue follow-up as outpatient.  3.  History of gout-no acute attack.  Continue allopurinol.  4.  COPD-no acute exacerbation.  Continue Breo and Incruse Ellipta  5.  Essential hypertension-continue hydralazine, Imdur.  6.  Hypothyroidism-continue Synthroid.  7.  Depression-continue Celexa.  Awaiting Insurance auth for discharge to sNF.   All the records are reviewed and case discussed with Care Management/Social Worker. Management plans discussed with the patient, family and they are in agreement.  CODE STATUS: DNR  DVT Prophylaxis: Lovenox  TOTAL TIME TAKING CARE OF THIS PATIENT: 30 minutes.   POSSIBLE D/C IN 2-3 DAYS, DEPENDING ON CLINICAL CONDITION.   Henreitta Leber M.D on 07/25/2019 at 4:08 PM  Between 7am to 6pm - Pager - [phone]  After 6pm go to [email or web address] - Proofreader  [facility]  Office  [phone]  CC: Primary care physician; Zhou-Talbert, Elwyn Lade, MD

## 2019-07-25 NOTE — Anesthesia Postprocedure Evaluation (Signed)
Anesthesia Post Note  Patient: Veronica Hubbard  Procedure(s) Performed: INTRAMEDULLARY (IM) NAIL INTERTROCHANTRIC (Right )  Patient location during evaluation: Nursing Unit Anesthesia Type: Combined General/Spinal Level of consciousness: oriented and awake and alert Pain management: pain level controlled Vital Signs Assessment: post-procedure vital signs reviewed and stable Respiratory status: spontaneous breathing and respiratory function stable Cardiovascular status: blood pressure returned to baseline and stable Postop Assessment: no headache, no backache and patient able to bend at knees Anesthetic complications: no     Last Vitals:  Vitals:   07/24/19 2314 07/25/19 0822  BP: (!) 114/59 131/72  Pulse: 74 72  Resp: 18   Temp: 36.8 C 37.1 C  SpO2: 97% 98%    Last Pain:  Vitals:   07/25/19 1100  TempSrc:   PainSc: 6                  Rosebud Poles C

## 2019-07-25 NOTE — TOC Progression Note (Signed)
Transition of Care [facility]) - Progression Note    Patient Details  Name: Veronica Hubbard MRN: [id number] Date of Birth: Aug 31, 1935  [facility] of [facility]) CM/SW Contact  Su Hilt, RN Phone Number: 07/25/2019, 11:02 AM  Clinical Narrative:     Spoke with the patient and reviewed the bed choices, she agrees to go to [facility] she was there previously.  She asked that I call her sister and let her know, I called Radiation protection practitioner and they are starting insurance auth I called her sister [name] and let her know.        Expected Discharge Plan and Services           Expected Discharge Date: 07/25/19                                     Social Determinants of Health (SDOH) Interventions    Readmission Risk Interventions No flowsheet data found.

## 2019-07-25 NOTE — Progress Notes (Signed)
Subjective:  Patient reports pain as moderate.  Patient somewhat confused this am  Objective:   VITALS:   Vitals:   07/24/19 1232 07/24/19 1455 07/24/19 1549 07/24/19 2314  BP: (!) 139/107 (!) 96/41 121/86 (!) 114/59  Pulse: 70 78 74 74  Resp:  19  18  Temp:  99.2 F (37.3 C)  98.3 F (36.8 C)  TempSrc:  Oral    SpO2: 96% 95%  97%  Weight:      Height:        PHYSICAL EXAM:  Neurologically intact ABD soft Neurovascular intact Sensation intact distally Intact pulses distally Dorsiflexion/Plantar flexion intact Incision: dressing C/D/I No cellulitis present Compartment soft  LABS  Results for orders placed or performed during the hospital encounter of 07/22/19 (from the past 24 hour(s))  Glucose, capillary     Status: Abnormal   Collection Time: 07/24/19 11:30 AM  Result Value Ref Range   Glucose-Capillary 129 (H) 70 - 99 mg/dL  Basic metabolic panel     Status: Abnormal   Collection Time: 07/25/19  4:49 AM  Result Value Ref Range   Sodium 136 135 - 145 mmol/L   Potassium 4.1 3.5 - 5.1 mmol/L   Chloride 108 98 - 111 mmol/L   CO2 24 22 - 32 mmol/L   Glucose, Bld 103 (H) 70 - 99 mg/dL   BUN 12 8 - 23 mg/dL   Creatinine, Ser 0.70 0.44 - 1.00 mg/dL   Calcium 9.1 8.9 - 10.3 mg/dL   GFR calc non Af Amer >60 >60 mL/min   GFR calc Af Amer >60 >60 mL/min   Anion gap 4 (L) 5 - 15  CBC     Status: Abnormal   Collection Time: 07/25/19  4:49 AM  Result Value Ref Range   WBC 26.6 (H) 4.0 - 10.5 K/uL   RBC 3.76 (L) 3.87 - 5.11 MIL/uL   Hemoglobin 10.2 (L) 12.0 - 15.0 g/dL   HCT 32.4 (L) 36.0 - 46.0 %   MCV 86.2 80.0 - 100.0 fL   MCH 27.1 26.0 - 34.0 pg   MCHC 31.5 30.0 - 36.0 g/dL   RDW 19.8 (H) 11.5 - 15.5 %   Platelets 349 150 - 400 K/uL   nRBC 0.2 0.0 - 0.2 %  Magnesium     Status: None   Collection Time: 07/25/19  4:49 AM  Result Value Ref Range   Magnesium 2.2 1.7 - 2.4 mg/dL    Dg Hip Operative Unilat W Or W/o Pelvis Right  Result Date:  07/23/2019 CLINICAL DATA:  84 year old female undergoing ORIF of an intertrochanteric femoral fracture. EXAM: OPERATIVE RIGHT HIP (WITH PELVIS IF PERFORMED) 8 VIEWS TECHNIQUE: Fluoroscopic spot image(s) were submitted for interpretation post-operatively. COMPARISON:  Preoperative radiographs 07/22/2019 FINDINGS: A total of 8 intraoperative saved images were obtained during open reduction and internal fixation of the intertrochanteric femoral neck fracture. The images demonstrate placement of an intramedullary nail with a single distal interlocking screw and a trans femoral neck gamma nail. No evidence of immediate hardware complication. IMPRESSION: ORIF of intertrochanteric femoral neck fracture without evidence of immediate hardware complication. Electronically Signed   By: Jacqulynn Cadet M.D.   On: 07/23/2019 16:38    Assessment/Plan: 2 Days Post-Op   Active Problems:   Closed right hip fracture, initial encounter ([place])   Advance diet  Up with therapy Discharge to SNF when medically stable. Ok from ortho standpoint. Weight bearing as tolerated RLE Lovenox for DVT prophylaxis daily Follow up with  Dr. Harlow Mares office in 2 weeks (08/05/2019) for staple removal, call office for appoimtment [place] , PA-C 07/25/2019, 6:35 AM

## 2019-07-25 NOTE — Progress Notes (Signed)
Physical Therapy Treatment Patient Details Name: Veronica Hubbard MRN: [id number] DOB: 1935/09/09 Today's Date: 07/25/2019    History of Present Illness Pt is an 84 y.o. female s/p R ORIF with IM nailing (07/23/2019), after presenting to ED after a fall at home 09/15. Pt with PMH of HTN, hypothyroidism, chronic leukocytosis secondary to [facility] (chemotherapy) in the past, CAD, COPD, and chronic back pain s/p multi-level kyphoplasties: L1-L2 (05/13/19), L3 (04/10/2019), L3 and L4 (04/22/2019), L5 (07/02/2019) and T12 (07/04/2019).    PT Comments    Continues to report significant pain (though improved from previous date) in R LE; generally guarded, demonstrating significant pain avoidance behaviors throughout session.  Limited ability to isolate and dissociate R LE from trunk with mobility tasks, but tolerates transition to unsupported sitting without significant increase in pain rating. Able to complete sit/stand with RW, mod assist +2, and bed/chair with RW, max assist +2 this date.  Limited tolerance for R LE stance time/weight acceptance, mild buckling with loading attempts (due to pain).  Additional gait distance limited as result; however, patient very pleased with ability to complete OOB to chair this date.  Will continue to progress mobility as appropriate.   Follow Up Recommendations  SNF     Equipment Recommendations  Rolling walker with 5" wheels    Recommendations for Other Services       Precautions / Restrictions Precautions Precautions: Fall Precaution Comments: Recent history of multiple kyphoplasties Restrictions Weight Bearing Restrictions: Yes RLE Weight Bearing: Weight bearing as tolerated    Mobility  Bed Mobility Overal bed mobility: Needs Assistance Bed Mobility: Supine to Sit     Supine to sit: Mod assist;Max assist     General bed mobility comments: very guarded, poor dissociation of trunk and extremities  Transfers Overall transfer level: Needs  assistance Equipment used: Rolling walker (2 wheeled) Transfers: Sit to/from Stand Sit to Stand: Mod assist;+2 physical assistance         General transfer comment: extensive assist for lift off; min/mod assist +2 for static standing balance once upright  Ambulation/Gait Ambulation/Gait assistance: Mod assist;Max assist;+2 physical assistance Gait Distance (Feet): 5 Feet Assistive device: Rolling walker (2 wheeled)       General Gait Details: tends to scoot/slide L LE across floor (vs true stepping) due to limited WBing/stance time R LE; step by step cuing, constant verbal encouragement required throughout. Multiple attempts to sit mid-transfer, redirection requried fro postural extension and completion of transfer   Stairs             Wheelchair Mobility    Modified Rankin (Stroke Patients Only)       Balance Overall balance assessment: Needs assistance Sitting-balance support: No upper extremity supported;Feet supported Sitting balance-Leahy Scale: Fair Sitting balance - Comments: unsupported sitting edge of bed, min/sup; does prefer UE support to offset pain in R hip with upright positioning   Standing balance support: Bilateral upper extremity supported Standing balance-Leahy Scale: Poor                              Cognition Arousal/Alertness: Awake/alert;Suspect due to medications Behavior During Therapy: [facility] for tasks assessed/performed Overall Cognitive Status: Within Functional Limits for tasks assessed                                 General Comments: alert and oriented, questionable historian based on chart review, however,  pt reports feeling "lost" and "out of it" 2/2 pain medication      Exercises Other Exercises Other Exercises: Seated R LE therex, 1x10, act ROM: ankle pumps, LAQs, marching.  Limited ROM due to pain, through improved tolerance from previous date    General Comments        Pertinent Vitals/Pain Pain  Assessment: Faces Pain Score: 9  Faces Pain Scale: Hurts even more Pain Location: R hip/lateral thigh with movement Pain Descriptors / Indicators: Aching;Moaning Pain Intervention(s): Limited activity within patient's tolerance;Monitored during session;Repositioned    Home Living Family/patient expects to be discharged to:: Private residence Living Arrangements: Other relatives Available Help at Discharge: Family Type of Home: House Home Access: Ramped entrance;Stairs to enter   Home Layout: One level Home Equipment: Environmental consultant - 4 wheels;Bedside commode;Walker - 2 wheels;Shower seat      Prior Function Level of Independence: Needs assistance  Gait / Transfers Assistance Needed: was using 2WW but didn't have it when she fell ADL's / Homemaking Assistance Needed: sometimes needed help with showering, dressing, sister helping with meals, cleaning, med mgt Comments: 3 falls in past 12 months "I think"   PT Goals (current goals can now be found in the care plan section) Acute Rehab PT Goals Patient Stated Goal: to have less pain PT Goal Formulation: With patient Time For Goal Achievement: 08/07/19 Potential to Achieve Goals: Good Progress towards PT goals: Progressing toward goals    Frequency    BID      PT Plan Current plan remains appropriate    Co-evaluation              AM-PAC PT "6 Clicks" Mobility   Outcome Measure  Help needed turning from your back to your side while in a flat bed without using bedrails?: A Lot Help needed moving from lying on your back to sitting on the side of a flat bed without using bedrails?: A Lot Help needed moving to and from a bed to a chair (including a wheelchair)?: A Lot Help needed standing up from a chair using your arms (e.g., wheelchair or bedside chair)?: A Lot Help needed to walk in hospital room?: A Lot Help needed climbing 3-5 steps with a railing? : Total 6 Click Score: 11    End of Session Equipment Utilized During  Treatment: Gait belt Activity Tolerance: Patient tolerated treatment well;Patient limited by pain Patient left: in chair;with call bell/phone within reach;with chair alarm set Nurse Communication: Mobility status PT Visit Diagnosis: Unsteadiness on feet (R26.81);Difficulty in walking, not elsewhere classified (R26.2);Pain;Muscle weakness (generalized) (M62.81) Pain - Right/Left: Right Pain - part of body: Hip     Time: [id number] PT Time Calculation (min) (ACUTE ONLY): 32 min  Charges:  $Therapeutic Exercise: 8-22 mins $Therapeutic Activity: 8-22 mins                    Fronie Holstein H. Owens Shark, PT, DPT, NCS 07/25/19, 10:55 AM [phone]

## 2019-07-26 NOTE — Progress Notes (Signed)
Spoke with Dr. Jannifer Franklin about pt blood pressure. Per md ok to hold blood pressure medications.

## 2019-07-26 NOTE — Progress Notes (Signed)
  Subjective:  Patient reports pain as moderate.  Still confused but comfortable.  Objective:   VITALS:   Vitals:   07/25/19 1721 07/25/19 2111 07/26/19 0045 07/26/19 0524  BP: (!) 147/98 134/68 138/74 117/72  Pulse: (!) 110 87 84 76  Resp:  18 16 18   Temp: 98.8 F (37.1 C) 98.2 F (36.8 C) 98.6 F (37 C)   TempSrc: Oral Oral Oral   SpO2: 99% 98% 100% 98%  Weight:      Height:        PHYSICAL EXAM:  Neurologically intact ABD soft Neurovascular intact Sensation intact distally Intact pulses distally Dorsiflexion/Plantar flexion intact Incision: dressing C/D/I No cellulitis present Compartment soft dressing changed  LABS  No results found for this or any previous visit (from the past 24 hour(s)).  No results found.  Assessment/Plan: 3 Days Post-Op   Active Problems:   Closed right hip fracture, initial encounter ([place])   Advance diet Up with therapy  Discharge to [facility] medically stable. Ok from ortho standpoint. Weight bearing as tolerated RLE Lovenox for DVT prophylaxis daily Follow up with Dr. Harlow Mares office in 2 weeks (08/05/2019) for staple removal, call office for appoimtment [place] , PA-C 07/26/2019, 7:34 AM

## 2019-07-26 NOTE — Progress Notes (Signed)
[place] [facility] [place] NAME: Veronica Hubbard    MR#:  [id number]  DATE OF BIRTH:  12-Dec-1934  SUBJECTIVE:   Patient presents to the hospital after a fall and noted to have a right hip fracture.  Patient is status post right hip intra-medullary pinning postop day #3 today.  Patient still complaining of some intermittent nausea today.  Was going to be discharged to short-term rehab but awaiting insurance authorization.  REVIEW OF SYSTEMS:    Review of Systems  Constitutional: Negative for chills and fever.  HENT: Negative for congestion and tinnitus.   Eyes: Negative for blurred vision and double vision.  Respiratory: Negative for cough, shortness of breath and wheezing.   Cardiovascular: Negative for chest pain, orthopnea and PND.  Gastrointestinal: Positive for constipation. Negative for abdominal pain, diarrhea, nausea and vomiting.  Genitourinary: Negative for dysuria and hematuria.  Musculoskeletal: Positive for joint pain (Right Hip). Negative for falls.  Neurological: Negative for dizziness, sensory change and focal weakness.  All other systems reviewed and are negative.   Nutrition: Regular Tolerating Diet: Yes Tolerating PT: Eval noted.    DRUG ALLERGIES:   Allergies  Allergen Reactions  . Aspirin Shortness Of Breath  . Celecoxib Shortness Of Breath  . Iodinated Diagnostic Agents Other (See Comments)    Reaction:  Decreased kidney function  . Losartan Itching  . Morphine Nausea And Vomiting  . Verapamil Itching    VITALS:  Blood pressure 117/66, pulse 77, temperature (!) 97.4 F (36.3 C), temperature source Oral, resp. rate 18, height 5\' 3"  (1.6 m), weight 52 kg, SpO2 99 %.  PHYSICAL EXAMINATION:   Physical Exam  GENERAL:  84 y.o.-year-old patient sitting up in bed feeling a bit nauseated.   EYES: Pupils equal, round, reactive to light and accommodation. No scleral icterus. Extraocular muscles intact.  HEENT:  Head atraumatic, normocephalic. Oropharynx and nasopharynx clear.  NECK:  Supple, no jugular venous distention. No thyroid enlargement, no tenderness.  LUNGS: Normal breath sounds bilaterally, no wheezing, rales, rhonchi. No use of accessory muscles of respiration.  CARDIOVASCULAR: S1, S2 normal. No murmurs, rubs, or gallops.  ABDOMEN: Soft, nontender, nondistended. Bowel sounds present. No organomegaly or mass.  EXTREMITIES: No cyanosis, clubbing or edema b/l. Right hip/leg dressing in place with no acute drainage.   NEUROLOGIC: Cranial nerves II through XII are intact. No focal Motor or sensory deficits b/l.   PSYCHIATRIC: The patient is alert and oriented x 3.  SKIN: No obvious rash, lesion, or ulcer.    LABORATORY PANEL:   CBC Recent Labs  Lab 07/25/19 0449  WBC 26.6*  HGB 10.2*  HCT 32.4*  PLT 349   ------------------------------------------------------------------------------------------------------------------  Chemistries  Recent Labs  Lab 07/25/19 0449  NA 136  K 4.1  CL 108  CO2 24  GLUCOSE 103*  BUN 12  CREATININE 0.70  CALCIUM 9.1  MG 2.2   ------------------------------------------------------------------------------------------------------------------  Cardiac Enzymes No results for input(s): TROPONINI in the last 168 hours. ------------------------------------------------------------------------------------------------------------------  RADIOLOGY:  No results found.   ASSESSMENT AND PLAN:   84 year old female with past medical history of hypertension, hypothyroidism, GERD, coronary artery disease, COPD, anxiety, spinal stenosis who presents to the hospital after mechanical fall and noted to have a right hip fracture.  1.  Status post fall and right hip fracture- patient is status post right hip intramedullary pinning postop day #2 today. - Continue pain control with as needed Vicodin. -Awaiting insurance authorization for discharge to  short-term  rehab.  2.  Leukocytosis-patient has history of MDS, possible underlying CLL. - Continue follow-up as outpatient.  3.  History of gout-no acute attack.  Continue allopurinol.  4.  COPD-no acute exacerbation.  Continue Breo and Incruse Ellipta  5.  Essential hypertension-continue hydralazine, Imdur.  6.  Hypothyroidism-continue Synthroid.  7.  Depression-continue Celexa.  8. Constipation - improved and cont. Miralax PRN.   Awaiting Insurance auth for discharge to SNF.   All the records are reviewed and case discussed with Care Management/Social Worker. Management plans discussed with the patient, family and they are in agreement.  CODE STATUS: DNR  DVT Prophylaxis: Lovenox  TOTAL TIME TAKING CARE OF THIS PATIENT: 25 minutes.   POSSIBLE D/C IN 2-3 DAYS, DEPENDING ON CLINICAL CONDITION.   Henreitta Leber M.D on 07/26/2019 at 12:48 PM  Between 7am to 6pm - Pager - [phone]  After 6pm go to [email or web address] - Proofreader  [facility] [place] [facility]  Office  [phone]  CC: Primary care physician; Zhou-Talbert, Elwyn Lade, MD

## 2019-07-26 NOTE — Progress Notes (Signed)
Physical Therapy Treatment Patient Details Name: Veronica Hubbard MRN: [id number] DOB: January 06, 1935 Today's Date: 07/26/2019    History of Present Illness Pt is an 84 y.o. female s/p R ORIF with IM nailing (07/23/2019), after presenting to ED after a fall at home 09/15. Pt with PMH of HTN, hypothyroidism, chronic leukocytosis secondary to [facility] (chemotherapy) in the past, CAD, COPD, and chronic back pain s/p multi-level kyphoplasties: L1-L2 (05/13/19), L3 (04/10/2019), L3 and L4 (04/22/2019), L5 (07/02/2019) and T12 (07/04/2019).    PT Comments    Pt appearing less anxious and appearing to be in less pain this p.m.  She was able to perform bed mobility mod A with assistance to scoot to EOB.  Pt perseverates on R hip and hx of fall but was able to stand and perform pivot transfer min A with education for use of RW and gradual wt shift to R LE.  She was hesitant to WB on R LE but able to eventually take a few steps, stopping after the first step back and PT pulled chair to pt so that she could perform controlled stand to sit.  PT reviewed there ex and pt was able to perform AAROM and some AROM (hip abduction).  She presented with fair quad strength and good hip ROM during there ex.  Pt talkative throughout session but easily redirected.  She will continue to benefit from skilled PT with focus on strength, pain management, tolerance to activity and safe functional mobility.  Pt remains appropriate for SNF placement following discharge.   Follow Up Recommendations  SNF     Equipment Recommendations  Rolling walker with 5" wheels    Recommendations for Other Services       Precautions / Restrictions Precautions Precautions: Fall Precaution Comments: Recent history of multiple kyphoplasties Restrictions Weight Bearing Restrictions: Yes RLE Weight Bearing: Weight bearing as tolerated    Mobility  Bed Mobility Overal bed mobility: Needs Assistance Bed Mobility: Supine to Sit Rolling: Mod  assist         General bed mobility comments: Hand held assist and assistance to slide to EOB.  Pt eager to stand and PT prompted pt to sit for a moment. No dizziness or symptoms of hypotension noted.  Transfers Overall transfer level: Needs assistance Equipment used: Rolling walker (2 wheeled) Transfers: Stand Pivot Transfers Sit to Stand: From elevated surface;Min assist Stand pivot transfers: Min assist       General transfer comment: Close guard and min A to stabilize due to pt hesitancy to WB on R side.  Once standing, pt able to gradually wt shift to R side and use RW for support.  VC's for use of RW and sequencing.  Pt able to take one step back to chair and then PT pulled chair to pt due to pt being unable to advance L LE while WB on L LE.  Ambulation/Gait                 Stairs             Wheelchair Mobility    Modified Rankin (Stroke Patients Only)       Balance Overall balance assessment: Needs assistance Sitting-balance support: Single extremity supported Sitting balance-Leahy Scale: Good   Postural control: Left lateral lean Standing balance support: Bilateral upper extremity supported Standing balance-Leahy Scale: Fair Standing balance comment: Heavy use of RW, better able to wt shift on L side.  Cognition Arousal/Alertness: Awake/alert Behavior During Therapy: WFL for tasks assessed/performed Overall Cognitive Status: Within Functional Limits for tasks assessed                                 General Comments: Very talkative but followed directions 100% of time.      Exercises Total Joint Exercises Ankle Circles/Pumps: AROM;Both;20 reps(Education to perform these exercises hourly.) Quad Sets: Right;10 reps;Supine Heel Slides: AAROM;Right;10 reps;Supine Hip ABduction/ADduction: AAROM;10 reps;Supine Straight Leg Raises: AAROM;Right;10 reps;Supine Other Exercises Other Exercises:  supine AA/PROM for ankle pumps, heel slides, ab/add and slr x 10    General Comments        Pertinent Vitals/Pain Pain Assessment: Faces Faces Pain Scale: Hurts a little bit Pain Location: R hip with mobility Pain Descriptors / Indicators: Grimacing;Guarding Pain Intervention(s): Limited activity within patient's tolerance;Monitored during session;Premedicated before session    Home Living                      Prior Function            PT Goals (current goals can now be found in the care plan section) Acute Rehab PT Goals Patient Stated Goal: to have less pain PT Goal Formulation: With patient Time For Goal Achievement: 08/07/19 Potential to Achieve Goals: Good Progress towards PT goals: Progressing toward goals    Frequency    BID      PT Plan Current plan remains appropriate    Co-evaluation              AM-PAC PT "6 Clicks" Mobility   Outcome Measure  Help needed turning from your back to your side while in a flat bed without using bedrails?: A Little Help needed moving from lying on your back to sitting on the side of a flat bed without using bedrails?: A Lot Help needed moving to and from a bed to a chair (including a wheelchair)?: A Lot Help needed standing up from a chair using your arms (e.g., wheelchair or bedside chair)?: A Lot Help needed to walk in hospital room?: A Lot Help needed climbing 3-5 steps with a railing? : A Lot 6 Click Score: 13    End of Session Equipment Utilized During Treatment: Gait belt Activity Tolerance: Patient tolerated treatment well Patient left: with call bell/phone within reach;in chair;with chair alarm set Nurse Communication: Mobility status;Other (comment) PT Visit Diagnosis: Muscle weakness (generalized) (M62.81);Unsteadiness on feet (R26.81);Pain Pain - Right/Left: Right Pain - part of body: Hip     Time: 1430-1445 PT Time Calculation (min) (ACUTE ONLY): 15 min  Charges:  $Therapeutic Exercise:  8-22 mins                     Roxanne Gates, PT, DPT    Roxanne Gates 07/26/2019, 3:00 PM

## 2019-07-26 NOTE — Progress Notes (Signed)
Physical Therapy Treatment Patient Details Name: Veronica Hubbard MRN: [id number] DOB: 02/05/1935 Today's Date: 07/26/2019    History of Present Illness Pt is an 84 y.o. female s/p R ORIF with IM nailing (07/23/2019), after presenting to ED after a fall at home 09/15. Pt with PMH of HTN, hypothyroidism, chronic leukocytosis secondary to [facility] (chemotherapy) in the past, CAD, COPD, and chronic back pain s/p multi-level kyphoplasties: L1-L2 (05/13/19), L3 (04/10/2019), L3 and L4 (04/22/2019), L5 (07/02/2019) and T12 (07/04/2019).    PT Comments    Pt in bed, pre-medicated for session.  Participated in exercises as described below.  Pt crying and guarding during gently ex's.  Reports pain and nausea.  Discussed with RN.  Pt was up previously to commode - dependant transfer.  Will defer mobility at this time and continue as appropriate this pm.   Follow Up Recommendations  SNF     Equipment Recommendations  Rolling walker with 5" wheels    Recommendations for Other Services       Precautions / Restrictions Precautions Precautions: Fall Precaution Comments: Recent history of multiple kyphoplasties Restrictions Weight Bearing Restrictions: Yes RLE Weight Bearing: Weight bearing as tolerated    Mobility  Bed Mobility               General bed mobility comments: deferred due to pain  Transfers                    Ambulation/Gait                 Stairs             Wheelchair Mobility    Modified Rankin (Stroke Patients Only)       Balance                                            Cognition Arousal/Alertness: Awake/alert Behavior During Therapy: WFL for tasks assessed/performed Overall Cognitive Status: Within Functional Limits for tasks assessed                                        Exercises Other Exercises Other Exercises: supine AA/PROM for ankle pumps, heel slides, ab/add and slr x 10     General Comments        Pertinent Vitals/Pain Pain Assessment: Faces Faces Pain Scale: Hurts whole lot Pain Location: R hip/lateral thigh with movement Pain Descriptors / Indicators: Aching;Moaning;Crying Pain Intervention(s): Limited activity within patient's tolerance;Monitored during session;Premedicated before session    Home Living                      Prior Function            PT Goals (current goals can now be found in the care plan section) Progress towards PT goals: Progressing toward goals    Frequency    BID      PT Plan Current plan remains appropriate    Co-evaluation              AM-PAC PT "6 Clicks" Mobility   Outcome Measure  Help needed turning from your back to your side while in a flat bed without using bedrails?: A Lot Help needed moving from lying on your back to sitting on the side  of a flat bed without using bedrails?: A Lot Help needed moving to and from a bed to a chair (including a wheelchair)?: A Lot Help needed standing up from a chair using your arms (e.g., wheelchair or bedside chair)?: A Lot Help needed to walk in hospital room?: Total Help needed climbing 3-5 steps with a railing? : Total 6 Click Score: 10    End of Session   Activity Tolerance: Patient limited by pain Patient left: in bed;with call bell/phone within reach;with bed alarm set Nurse Communication: Mobility status;Other (comment) Pain - Right/Left: Right Pain - part of body: Hip     Time: [id number] PT Time Calculation (min) (ACUTE ONLY): 8 min  Charges:  $Therapeutic Exercise: 8-22 mins                    Chesley Noon, PTA 07/26/19, 11:51 AM

## 2019-07-27 LAB — SARS CORONAVIRUS 2 (TAT 6-24 HRS): SARS Coronavirus 2: NEGATIVE

## 2019-07-27 NOTE — Progress Notes (Signed)
[facility] NAME: Veronica Hubbard    MR#:  [id number]  DATE OF BIRTH:  1935/05/08  SUBJECTIVE:   Patient presents to the hospital after a fall and noted to have a right hip fracture.  Patient is status post right hip intra-medullary pinning postop day #4 today.  Nausea has improved.  Still having some right hip pain.    REVIEW OF SYSTEMS:    Review of Systems  Constitutional: Negative for chills and fever.  HENT: Negative for congestion and tinnitus.   Eyes: Negative for blurred vision and double vision.  Respiratory: Negative for cough, shortness of breath and wheezing.   Cardiovascular: Negative for chest pain, orthopnea and PND.  Gastrointestinal: Positive for constipation. Negative for abdominal pain, diarrhea, nausea and vomiting.  Genitourinary: Negative for dysuria and hematuria.  Musculoskeletal: Positive for joint pain (Right Hip). Negative for falls.  Neurological: Negative for dizziness, sensory change and focal weakness.  All other systems reviewed and are negative.   Nutrition: Regular Tolerating Diet: Yes Tolerating PT: Eval noted.    DRUG ALLERGIES:   Allergies  Allergen Reactions  . Aspirin Shortness Of Breath  . Celecoxib Shortness Of Breath  . Iodinated Diagnostic Agents Other (See Comments)    Reaction:  Decreased kidney function  . Losartan Itching  . Morphine Nausea And Vomiting  . Verapamil Itching    VITALS:  Blood pressure 125/72, pulse 97, temperature 98 F (36.7 C), resp. rate 18, height 5\' 3"  (1.6 m), weight 52 kg, SpO2 98 %.  PHYSICAL EXAMINATION:   Physical Exam  GENERAL:  84 y.o.-year-old patient sitting up in bed feeling a bit nauseated.   EYES: Pupils equal, round, reactive to light and accommodation. No scleral icterus. Extraocular muscles intact.  HEENT: Head atraumatic, normocephalic. Oropharynx and nasopharynx clear.  NECK:  Supple, no jugular venous distention. No  thyroid enlargement, no tenderness.  LUNGS: Normal breath sounds bilaterally, no wheezing, rales, rhonchi. No use of accessory muscles of respiration.  CARDIOVASCULAR: S1, S2 normal. No murmurs, rubs, or gallops.  ABDOMEN: Soft, nontender, nondistended. Bowel sounds present. No organomegaly or mass.  EXTREMITIES: No cyanosis, clubbing or edema b/l. Right hip/leg dressing in place with no acute drainage.   NEUROLOGIC: Cranial nerves II through XII are intact. No focal Motor or sensory deficits b/l.   PSYCHIATRIC: The patient is alert and oriented x 3.  SKIN: No obvious rash, lesion, or ulcer.    LABORATORY PANEL:   CBC Recent Labs  Lab 07/25/19 0449  WBC 26.6*  HGB 10.2*  HCT 32.4*  PLT 349   ------------------------------------------------------------------------------------------------------------------  Chemistries  Recent Labs  Lab 07/25/19 0449  NA 136  K 4.1  CL 108  CO2 24  GLUCOSE 103*  BUN 12  CREATININE 0.70  CALCIUM 9.1  MG 2.2   ------------------------------------------------------------------------------------------------------------------  Cardiac Enzymes No results for input(s): TROPONINI in the last 168 hours. ------------------------------------------------------------------------------------------------------------------  RADIOLOGY:  No results found.   ASSESSMENT AND PLAN:   84 year old female with past medical history of hypertension, hypothyroidism, GERD, coronary artery disease, COPD, anxiety, spinal stenosis who presents to the hospital after mechanical fall and noted to have a right hip fracture.  1.  Status post fall and right hip fracture- patient is status post right hip intramedullary pinning postop day #3 today. - Continue pain control with as needed Vicodin. - Awaiting insurance authorization for discharge to short-term rehab.  2.  Leukocytosis-patient has history of MDS, possible underlying  CLL. - Continue follow-up as outpatient.   3.  History of gout-no acute attack.  Continue allopurinol.  4.  COPD-no acute exacerbation.  Continue Breo and Incruse Ellipta  5.  Essential hypertension-continue hydralazine, Imdur.  6.  Hypothyroidism-continue Synthroid.  7.  Depression-continue Celexa.  8. Constipation - improved and cont. Miralax PRN.   Awaiting Insurance auth for discharge to SNF. Will repeat COVID-19 test today.    All the records are reviewed and case discussed with Care Management/Social Worker. Management plans discussed with the patient, family and they are in agreement.  CODE STATUS: DNR  DVT Prophylaxis: Lovenox  TOTAL TIME TAKING CARE OF THIS PATIENT: 25 minutes.   POSSIBLE D/C IN 1-2 DAYS, DEPENDING ON CLINICAL CONDITION.   Henreitta Leber M.D on 07/27/2019 at 12:24 PM  Between 7am to 6pm - Pager - [phone]  After 6pm go to [email or web address] - Proofreader  [facility] [place] [facility]  Office  [phone]  CC: Primary care physician; Zhou-Talbert, Elwyn Lade, MD

## 2019-07-27 NOTE — Progress Notes (Signed)
Physical Therapy Treatment Patient Details Name: Veronica Hubbard MRN: [id number] DOB: Sep 09, 1935 Today's Date: 07/27/2019    History of Present Illness Pt is an 84 y.o. female s/p R ORIF with IM nailing (07/23/2019), after presenting to ED after a fall at home 09/15. Pt with PMH of HTN, hypothyroidism, chronic leukocytosis secondary to [facility] (chemotherapy) in the past, CAD, COPD, and chronic back pain s/p multi-level kyphoplasties: L1-L2 (05/13/19), L3 (04/10/2019), L3 and L4 (04/22/2019), L5 (07/02/2019) and T12 (07/04/2019).    PT Comments    Pt overall more willing to participate in there ex today, stating "Oh I can do this one" when asked to initiate ankle pumps and hip abduction.  She did focus on pain throughout but reported sharp increase in pain during SAQ.  PT provided AAROM assist where needed.  Pt able to get to bedside with mod A and participated more in moving LE's over EOB today.  She was able to stand with bed at a lower level and required consistent VC's for upright posture and sequencing of transfer to chair.  Pt slides feet and tends to look down, requiring encouragement to regain confidence.  She was able to scoot herself back in the chair today, once sitting, without assistance from PT.  She stated that her "tailbone got sore" yesterday while sitting and PT educated about positioning in the chair and readjusting if pt slides down in chair to prevent pressure ulcers.  Pt will continue to benefit from skilled PT with focus on strength, pain management, tolerance to activity, safe functional mobility and balance.  Follow Up Recommendations  SNF     Equipment Recommendations  Rolling walker with 5" wheels    Recommendations for Other Services       Precautions / Restrictions Precautions Precautions: Fall Precaution Comments: Recent history of multiple kyphoplasties Restrictions Weight Bearing Restrictions: Yes RLE Weight Bearing: Weight bearing as tolerated     Mobility  Bed Mobility Overal bed mobility: Needs Assistance Bed Mobility: Supine to Sit Rolling: Mod assist         General bed mobility comments: Hand held assist and assistance to slide to EOB. Pt participated more with movement of LE's today.  Transfers Overall transfer level: Needs assistance Equipment used: Rolling walker (2 wheeled) Transfers: Stand Pivot Transfers Sit to Stand: From elevated surface;Min assist Stand pivot transfers: Min assist       General transfer comment: Pt able to stand on first attempt with bed lower today.  All limb advancement is sliding with knee buckling slightly on R side with WB.  Pt able to use RW for support when prompted and several VC's for sequencing.  Ambulation/Gait                 Stairs             Wheelchair Mobility    Modified Rankin (Stroke Patients Only)       Balance Overall balance assessment: Needs assistance Sitting-balance support: Single extremity supported Sitting balance-Leahy Scale: Good   Postural control: Left lateral lean Standing balance support: Bilateral upper extremity supported Standing balance-Leahy Scale: Fair Standing balance comment: Required RW for support, maintains flexed posture.                            Cognition Arousal/Alertness: Awake/alert Behavior During Therapy: WFL for tasks assessed/performed Overall Cognitive Status: Within Functional Limits for tasks assessed  General Comments: Very talkative but followed directions 100% of time.      Exercises Total Joint Exercises Ankle Circles/Pumps: AROM;Both;20 reps(Education to perform these exercises hourly.) Quad Sets: Right;Supine;15 reps(Manual cues to initiate.) Short Arc Quad: Right;Strengthening;AAROM;10 reps;Supine(Pt report of pain increase.) Heel Slides: AAROM;Right;Supine;15 reps Hip ABduction/ADduction: AAROM;Supine;15 reps Straight Leg Raises:  AAROM;Right;Supine;15 reps Other Exercises Other Exercises: Time to assist pt with dentures and cleaning of denture container.  x3 min    General Comments        Pertinent Vitals/Pain Pain Assessment: Faces Faces Pain Scale: Hurts a little bit Pain Location: R hip during ther ex, pt points to lateral thigh and says "that's where it hurts" during quad sets. Pain Descriptors / Indicators: Aching;Grimacing Pain Intervention(s): Limited activity within patient's tolerance;Monitored during session    Home Living                      Prior Function            PT Goals (current goals can now be found in the care plan section) Acute Rehab PT Goals Patient Stated Goal: to have less pain PT Goal Formulation: With patient Time For Goal Achievement: 08/07/19 Potential to Achieve Goals: Good Progress towards PT goals: Progressing toward goals    Frequency    BID      PT Plan Current plan remains appropriate    Co-evaluation              AM-PAC PT "6 Clicks" Mobility   Outcome Measure  Help needed turning from your back to your side while in a flat bed without using bedrails?: A Little Help needed moving from lying on your back to sitting on the side of a flat bed without using bedrails?: A Lot Help needed moving to and from a bed to a chair (including a wheelchair)?: A Little Help needed standing up from a chair using your arms (e.g., wheelchair or bedside chair)?: A Little Help needed to walk in hospital room?: A Lot Help needed climbing 3-5 steps with a railing? : A Lot 6 Click Score: 15    End of Session Equipment Utilized During Treatment: Gait belt Activity Tolerance: Patient tolerated treatment well Patient left: with call bell/phone within reach;in chair;with chair alarm set Nurse Communication: Mobility status PT Visit Diagnosis: Muscle weakness (generalized) (M62.81);Unsteadiness on feet (R26.81);Pain Pain - Right/Left: Right Pain - part of body:  Hip     Time: 1020-1045 PT Time Calculation (min) (ACUTE ONLY): 25 min  Charges:  $Therapeutic Exercise: 8-22 mins $Therapeutic Activity: 8-22 mins                     Roxanne Gates, PT, DPT    Roxanne Gates 07/27/2019, 10:55 AM

## 2019-07-27 NOTE — Progress Notes (Signed)
Subjective: 4 Days Post-Op Procedure(s) (LRB): INTRAMEDULLARY (IM) NAIL INTERTROCHANTRIC (Right)   Out of bed in chair.  Alert and cooperative.  Ready for rehab when insurance approves.  Patient reports pain as mild.  Objective:   VITALS:   Vitals:   07/27/19 0527 07/27/19 0753  BP: 138/65 125/72  Pulse: 75 97  Resp:  18  Temp:  98 F (36.7 C)  SpO2:  98%    Neurologically intact ABD soft Neurovascular intact Sensation intact distally Intact pulses distally Dorsiflexion/Plantar flexion intact Incision: no drainage  LABS Recent Labs    07/25/19 0449  HGB 10.2*  HCT 32.4*  WBC 26.6*  PLT 349    Recent Labs    07/25/19 0449  NA 136  K 4.1  BUN 12  CREATININE 0.70  GLUCOSE 103*    No results for input(s): LABPT, INR in the last 72 hours.   Assessment/Plan: 4 Days Post-Op Procedure(s) (LRB): INTRAMEDULLARY (IM) NAIL INTERTROCHANTRIC (Right)   Advance diet Up with therapy Discharge to SNF when insurance approves. Return to office 2 weeks for exam and x-ray with Dr. Harlow Mares

## 2019-07-27 NOTE — Progress Notes (Signed)
Pt with blood pressure of 125/73 and pt with hydralazine and metoperolol ordered tonight. Per Dr. Jannifer Franklin may hold medication tonight.

## 2019-07-28 LAB — CBC
HCT: 28.9 % — ABNORMAL LOW (ref 36.0–46.0)
Hemoglobin: 9.1 g/dL — ABNORMAL LOW (ref 12.0–15.0)
MCH: 26.8 pg (ref 26.0–34.0)
MCHC: 31.5 g/dL (ref 30.0–36.0)
MCV: 85 fL (ref 80.0–100.0)
Platelets: 408 10*3/uL — ABNORMAL HIGH (ref 150–400)
RBC: 3.4 MIL/uL — ABNORMAL LOW (ref 3.87–5.11)
RDW: 19 % — ABNORMAL HIGH (ref 11.5–15.5)
WBC: 24.5 10*3/uL — ABNORMAL HIGH (ref 4.0–10.5)
nRBC: 0.4 % — ABNORMAL HIGH (ref 0.0–0.2)

## 2019-07-28 LAB — BASIC METABOLIC PANEL
Anion gap: 7 (ref 5–15)
BUN: 24 mg/dL — ABNORMAL HIGH (ref 8–23)
CO2: 22 mmol/L (ref 22–32)
Calcium: 9.8 mg/dL (ref 8.9–10.3)
Chloride: 106 mmol/L (ref 98–111)
Creatinine, Ser: 0.75 mg/dL (ref 0.44–1.00)
GFR calc Af Amer: >60 mL/min (ref >60)
GFR calc non Af Amer: >60 mL/min (ref >60)
Glucose, Bld: 96 mg/dL (ref 70–99)
Potassium: 3.4 mmol/L — ABNORMAL LOW (ref 3.5–5.1)
Sodium: 135 mmol/L (ref 135–145)

## 2019-07-28 MED ORDER — TRAMADOL HCL 50 MG PO TABS
50.0000 mg | ORAL_TABLET | Freq: Four times a day (QID) | ORAL | Status: DC | PRN
  Administered 2019-07-28 – 2019-07-29 (×3): 50 mg via ORAL
  Filled 2019-07-28 (×3): qty 1

## 2019-07-28 NOTE — Progress Notes (Signed)
Physical Therapy Treatment Patient Details Name: Veronica Hubbard MRN: [id number] DOB: [birth year] Today's Date: 07/28/2019    History of Present Illness Pt is an 84 y.o. female s/p R ORIF with IM nailing (07/23/2019), after presenting to ED after a fall at home 09/15. Pt with PMH of HTN, hypothyroidism, chronic leukocytosis secondary to [facility] (chemotherapy) in the past, CAD, COPD, and chronic back pain s/p multi-level kyphoplasties: L1-L2 (05/13/19), L3 (04/10/2019), L3 and L4 (04/22/2019), L5 (07/02/2019) and T12 (07/04/2019).    PT Comments    Pt initially refuses PT; pt tearful regarding many emotional stressors with fall/surgery and rehab primarily regarding financial stress. Pt states several times she wishes to "fall asleep and never wake up". Pt declines speaking with a Chaplain. Pain in R hip 8/10 and later 9/10 requesting Tylenol; nursing notified. Pt states she has a church and brother is a Retail banker, but has not spoken with anyone to date. Will reach out to [place] services. Pt participates in supine exercises agreeably then notes she needs to use the bathroom. Pt requires Mod A for supine to sit bed mobility with sequence cues. STS with rolling walker with Min A from elevated surface, but also from lower BSC surface. Increased time with cues to place hands to/from rolling walker. Set up and supervision for toileting. Pt returned to bed per request with Min A. Continue PT to progress strength, endurance and all functional mobility.   Follow Up Recommendations  SNF     Equipment Recommendations       Recommendations for Other Services Other (comment)(Chaplain)     Precautions / Restrictions Precautions Precautions: Fall Restrictions Weight Bearing Restrictions: Yes RLE Weight Bearing: Weight bearing as tolerated    Mobility  Bed Mobility Overal bed mobility: Needs Assistance Bed Mobility: Supine to Sit;Sit to Supine     Supine to sit: Mod assist Sit to supine: Min  assist   General bed mobility comments: Slow to rise with heavier assist for trunk. Pt gets part way and wants to rest due to pain; however, is weight bearing mostly on R. Educated and pt continued smoothly with finishing to sit at edge of bed with Supervision  Transfers Overall transfer level: Needs assistance Equipment used: Rolling walker (2 wheeled) Transfers: Sit to/from Stand Sit to Stand: Min assist;From elevated surface(also from [facility])         General transfer comment: Sequence cues for safe hand placement; increased time to transition hands from sit surface to/from rw  Ambulation/Gait Ambulation/Gait assistance: Min assist Gait Distance (Feet): 3 Feet(2 times (bed to/from [facility])) Assistive device: Rolling walker (2 wheeled) Gait Pattern/deviations: Step-to pattern;Decreased weight shift to right;Trunk flexed   Gait velocity interpretation: <1.8 ft/sec, indicate of risk for recurrent falls General Gait Details: decreased shift/weight bear to RLE; sequence cues   Stairs             Wheelchair Mobility    Modified Rankin (Stroke Patients Only)       Balance Overall balance assessment: Needs assistance Sitting-balance support: Bilateral upper extremity supported;Feet supported Sitting balance-Leahy Scale: Fair     Standing balance support: Bilateral upper extremity supported Standing balance-Leahy Scale: Fair                              Cognition Arousal/Alertness: Awake/alert Behavior During Therapy: WFL for tasks assessed/performed(upset) Overall Cognitive Status: Within Functional Limits for tasks assessed  General Comments: Pt tearful intermittently throughout session; pt also expresses desire to "fall asleep and never wake up:Marland Kitchen Offered talking with chaplain several times, but pt declines. 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       Exercises General Exercises - Lower  Extremity Ankle Circles/Pumps: AROM;Both;20 reps Quad Sets: Strengthening;Both;20 reps Gluteal Sets: Strengthening;Both;20 reps Heel Slides: AAROM;Right;10 reps Hip ABduction/ADduction: AAROM;Right;10 reps Other Exercises Other Exercises: toileting set up and supervision    General Comments        Pertinent Vitals/Pain Pain Assessment: 0-10 Pain Score: 8 (Reports 9 post use of BSC) Pain Location: R hip Pain Descriptors / Indicators: Aching;Constant    Home Living                      Prior Function            PT Goals (current goals can now be found in the care plan section) Progress towards PT goals: Progressing toward goals    Frequency    BID      PT Plan Current plan remains appropriate    Co-evaluation              AM-PAC PT "6 Clicks" Mobility   Outcome Measure  Help needed turning from your back to your side while in a flat bed without using bedrails?: A Little Help needed moving from lying on your back to sitting on the side of a flat bed without using bedrails?: A Lot Help needed moving to and from a bed to a chair (including a wheelchair)?: A Little Help needed standing up from a chair using your arms (e.g., wheelchair or bedside chair)?: A Little Help needed to walk in hospital room?: A Lot Help needed climbing 3-5 steps with a railing? : A Lot 6 Click Score: 15    End of Session   Activity Tolerance: Patient tolerated treatment well;Other (comment)(becomes tearful at times throughout) Patient left: in chair;with call bell/phone within reach;with chair alarm set   PT Visit Diagnosis: Muscle weakness (generalized) (M62.81);Unsteadiness on feet (R26.81);Pain Pain - Right/Left: Right Pain - part of body: Hip     Time: 1411-1446 PT Time Calculation (min) (ACUTE ONLY): 35 min  Charges:  $Gait Training: 8-22 mins $Therapeutic Exercise: 8-22 mins                      Larae Grooms, PTA 07/28/2019, 3:37 PM

## 2019-07-28 NOTE — Progress Notes (Signed)
Occupational Therapy Treatment Patient Details Name: Veronica Hubbard MRN: [id number] DOB: September 13, 1935 Today's Date: 07/28/2019    History of present illness Pt is an 84 y.o. female s/p R ORIF with IM nailing (07/23/2019), after presenting to ED after a fall at home 09/15. Pt with PMH of HTN, hypothyroidism, chronic leukocytosis secondary to [facility] (chemotherapy) in the past, CAD, COPD, and chronic back pain s/p multi-level kyphoplasties: L1-L2 (05/13/19), L3 (04/10/2019), L3 and L4 (04/22/2019), L5 (07/02/2019) and T12 (07/04/2019).   OT comments  Pt seen for OT tx this date. Pt tearful reporting worry about not being able to afford SNF for rehab. Emotional support and active listening provided. Pt reports her sisters can come stay with her and she can use her w/c at home. Pt instructed in functional mobility for ADL and review of AE/DME to improve safety/indep with ADL. Pt declined to trial functional transfer training despite acknowledgement that she would need to improve her performance in functional mobility if she were to go home with her sister's assistance. RN in room near end of session to provide pain medication among other medications. Will continue to progress towards OT goals. SNF remains most appropriate and safest discharge plan at this time.   Follow Up Recommendations  SNF    Equipment Recommendations  None recommended by OT    Recommendations for Other Services      Precautions / Restrictions Precautions Precautions: Fall Precaution Comments: Recent history of multiple kyphoplasties Restrictions Weight Bearing Restrictions: Yes RLE Weight Bearing: Weight bearing as tolerated       Mobility Bed Mobility               General bed mobility comments: deferred, pt up in recliner at start and end of session  Transfers                 General transfer comment: pt declined    Balance                                           ADL  either performed or assessed with clinical judgement   ADL                                         General ADL Comments: Set up for grooming tasks seated in recliner; Max A for STS LB ADL tasks and toileting     Vision Baseline Vision/History: Wears glasses Wears Glasses: Reading only Patient Visual Report: No change from baseline Vision Assessment?: No apparent visual deficits   Perception     Praxis      Cognition Arousal/Alertness: Awake/alert Behavior During Therapy: WFL for tasks assessed/performed Overall Cognitive Status: Within Functional Limits for tasks assessed                                 General Comments: Pt tearful during session 2/2 concerns over copay for SNF; emotional support and active listening provided        Exercises Other Exercises Other Exercises: Pt instructed in functional mobility for ADL and review of AE/DME to improve safety/indep with ADL   Shoulder Instructions       General Comments      Pertinent Vitals/ Pain  Pain Assessment: 0-10 Pain Score: 6  Pain Location: R hip with movement Pain Descriptors / Indicators: Aching;Grimacing Pain Intervention(s): Limited activity within patient's tolerance;Monitored during session;RN gave pain meds during session;Patient requesting pain meds-RN notified  Home Living                                          Prior Functioning/Environment              Frequency  Min 2X/week        Progress Toward Goals  OT Goals(current goals can now be found in the care plan section)  Progress towards OT goals: OT to reassess next treatment  Acute Rehab OT Goals Patient Stated Goal: to have less pain OT Goal Formulation: With patient Time For Goal Achievement: 08/08/19 Potential to Achieve Goals: Good  Plan Discharge plan remains appropriate;Frequency remains appropriate    Co-evaluation                 AM-PAC OT "6 Clicks"  Daily Activity     Outcome Measure   Help from another person eating meals?: A Little Help from another person taking care of personal grooming?: A Little Help from another person toileting, which includes using toliet, bedpan, or urinal?: A Lot Help from another person bathing (including washing, rinsing, drying)?: A Lot Help from another person to put on and taking off regular upper body clothing?: A Little Help from another person to put on and taking off regular lower body clothing?: A Lot 6 Click Score: 15    End of Session    OT Visit Diagnosis: Other abnormalities of gait and mobility (R26.89);Pain Pain - Right/Left: Right Pain - part of body: Hip   Activity Tolerance Patient limited by pain   Patient Left in chair;with call bell/phone within reach;with chair alarm set;with nursing/sitter in room   Nurse Communication Patient requests pain meds        Time: [id number] OT Time Calculation (min): 13 min  Charges: OT General Charges $OT Visit: 1 Visit OT Treatments $Therapeutic Activity: 8-22 mins  Jeni Salles, MPH, MS, OTR/L ascom [phone] 07/28/19, 9:34 AM

## 2019-07-28 NOTE — Progress Notes (Signed)
Pt is alert and oriented, upset this shift d/t placement in SNF , positive reassurement used with pt , helpful at times. States " I have been through so many surgeries this year".  Bright affect at times and then will start to cry , redirection helpful , responds well to active listening, denies pain or discomfort . Worked with PT.OT . Hydration and call bell within reach. Will continue to [facility] r

## 2019-07-28 NOTE — Care Management Important Message (Signed)
Important Message  Patient Details  Name: Veronica Hubbard MRN: [id number] Date of Birth: 1935/08/15   Medicare Important Message Given:  Yes     [name] A Amarionna Arca 07/28/2019, 11:27 AM

## 2019-07-28 NOTE — TOC Progression Note (Signed)
Transition of Care (TOC) - Progression Note    Patient Details  Name: Veronica Hubbard MRN: [id number] Date of Birth: 03/24/1935  Transition of Care (TOC) CM/SW Contact  Deliliah J Gregory, RN Phone Number: 07/28/2019, 10:19 AM  Clinical Narrative:     Met with the patient and her sister [place] in the room, They have decided to go ahead and accept [facility] bed offer.  I contacted Tina at [facility] and she will call the representative and see if they can get the auth switched over or they will start the auth process. Awaiting auth approval       Expected Discharge Plan and Services           Expected Discharge Date: 07/25/19                                     Social Determinants of Health (SDOH) Interventions    Readmission Risk Interventions No flowsheet data found.  

## 2019-07-28 NOTE — Progress Notes (Signed)
Pt upset at times during shift about financial issues , she is redirected easily and postive reinforcement used to redirect pt . Pt is waiting to hear from [facility] about a bed and is worried about finances, reminded pt lets work on getting stronger and tackle one thing at a time. Pt agreed and proceeded to call her sister . Will call Chaplain services to see if can visit with pt

## 2019-07-28 NOTE — Progress Notes (Signed)
Pt refusing to take morning medications.

## 2019-07-28 NOTE — TOC Progression Note (Signed)
Transition of Care [facility]) - Progression Note    Patient Details  Name: Veronica Hubbard MRN: [id number] Date of Birth: 15-Oct-1935  Transition of Care [facility]) CM/SW Contact  Su Hilt, RN Phone Number: 07/28/2019, 9:12 AM  Clinical Narrative:    Spoke with the patient in the room and called the sister [name]  I explained that the patient is in the copay days with [facility] and that is 178$ per day.  I explained to go to [facility] that would require up front payment of $2492, They both stated that there was no way that she could pay that.  The next choice would be to go to [facility].  I explained that the copay would be the same, [facility] would require some up front but not the whole amount.  I gave [name] [facility] phone number to call them and see what they could get worked out financially, she will call me back        Expected Discharge Plan and Services           Expected Discharge Date: 07/25/19                                     Social Determinants of Health (SDOH) Interventions    Readmission Risk Interventions No flowsheet data found.

## 2019-07-28 NOTE — Progress Notes (Signed)
Physical Therapy Treatment Patient Details Name: Saree Boye MRN: [id number] DOB: Nov 20, 1934 Today's Date: 07/28/2019    History of Present Illness Pt is an 84 y.o. female s/p R ORIF with IM nailing (07/23/2019), after presenting to ED after a fall at home 09/15. Pt with PMH of HTN, hypothyroidism, chronic leukocytosis secondary to [facility] (chemotherapy) in the past, CAD, COPD, and chronic back pain s/p multi-level kyphoplasties: L1-L2 (05/13/19), L3 (04/10/2019), L3 and L4 (04/22/2019), L5 (07/02/2019) and T12 (07/04/2019).    PT Comments    Pt initially tearful and upset over financial issues with going to skilled nursing facility. MD unsure if pt will work with PT at this time. Initial emotional support offered and pt educated on benefits of focusing attention on the positive things pt can do to improve strength and function to allow pt to get home (ie. Exercises) Pt listened and was able to resolve crying and work well with PT for exercises; pt declined standing activities, but pt is up in chair. Educated pt on exercises with assist given as needed as well as educated on self assist as needed. Pt reported pain in RLE 5/10 with no increase in pain post exercises. Encouraged pt to perform 3-4 times throughout the day. Continue PT to progress strength, endurance and functional mobility.    Follow Up Recommendations  SNF     Equipment Recommendations       Recommendations for Other Services       Precautions / Restrictions Precautions Precautions: Fall Precaution Comments: Recent history of multiple kyphoplasties Restrictions Weight Bearing Restrictions: Yes RLE Weight Bearing: Weight bearing as tolerated    Mobility  Bed Mobility               General bed mobility comments: Not tested; up in chair  Transfers                 General transfer comment: pt declined  Ambulation/Gait                 Stairs             Wheelchair Mobility     Modified Rankin (Stroke Patients Only)       Balance                                            Cognition Arousal/Alertness: Awake/alert Behavior During Therapy: WFL for tasks assessed/performed(upset) Overall Cognitive Status: Within Functional Limits for tasks assessed                                 General Comments: Pt upset over cost/finances and inability to pay for SNF      Exercises General Exercises - Lower Extremity Ankle Circles/Pumps: AROM;Both;20 reps Quad Sets: Strengthening;Both;20 reps Gluteal Sets: Strengthening;Both;20 reps Short Arc Quad: AROM;Both;20 reps Heel Slides: AAROM;Right;20 reps(AROM L; also 10 R with self assist AAROM) Hip ABduction/ADduction: AAROM;Right;20 reps(AROM L with rest pause btn ea rep) Straight Leg Raises: (small range in long sit) Hip Flexion/Marching: AAROM;Right;20 reps;Seated(assist/self assist as needed; AROM L) Other Exercises Other Exercises: educated on focus on positive and things/exercises pt can do to get stronger to improve versus stress, which will decrease ability to heal Other Exercises: Pt instructed in functional mobility for ADL and review of AE/DME to improve safety/indep with ADL  General Comments        Pertinent Vitals/Pain Pain Assessment: 0-10 Pain Score: 5  Pain Location: R hip(states it is tolerable) Pain Descriptors / Indicators: Aching;Constant Pain Intervention(s): Limited activity within patient's tolerance;Monitored during session    Home Living                      Prior Function            PT Goals (current goals can now be found in the care plan section) Acute Rehab PT Goals Patient Stated Goal: to have less pain Progress towards PT goals: Progressing toward goals    Frequency    BID      PT Plan Current plan remains appropriate    Co-evaluation              AM-PAC PT "6 Clicks" Mobility   Outcome Measure  Help needed  turning from your back to your side while in a flat bed without using bedrails?: A Little Help needed moving from lying on your back to sitting on the side of a flat bed without using bedrails?: A Lot Help needed moving to and from a bed to a chair (including a wheelchair)?: A Little Help needed standing up from a chair using your arms (e.g., wheelchair or bedside chair)?: A Little Help needed to walk in hospital room?: A Lot Help needed climbing 3-5 steps with a railing? : A Lot 6 Click Score: 15    End of Session   Activity Tolerance: Other (comment)(self limited due to emotions at this time) Patient left: in chair;with call bell/phone within reach;with chair alarm set   PT Visit Diagnosis: Muscle weakness (generalized) (M62.81);Unsteadiness on feet (R26.81);Pain Pain - Right/Left: Right Pain - part of body: Hip     Time: [id number] PT Time Calculation (min) (ACUTE ONLY): 25 min  Charges:  $Therapeutic Exercise: 23-37 mins                      Larae Grooms, PTA 07/28/2019, 9:59 AM

## 2019-07-28 NOTE — Care Management Important Message (Signed)
Important Message  Patient Details  Name: Veronica Hubbard MRN: [id number] Date of Birth: [birth year]   Medicare Important Message Given:  Yes     Su Hilt, RN 07/28/2019, 11:44 AM

## 2019-07-28 NOTE — Progress Notes (Signed)
Pt alert and oriented. Pt knows where she is. She knows why she is in the hospital. She knows the date and knows that she is being transferred to a SNF today. Pt telling staff that she is confuse and will not take any medication until her sister gets here. Pt sister requesting that pt not receive anymore hydrocodone.

## 2019-07-28 NOTE — Progress Notes (Signed)
Nutrition Follow-up  DOCUMENTATION CODES:   Not applicable  INTERVENTION:  Continue Ensure Enlive po TID, each supplement provides 350 kcal and 20 grams of protein.  NUTRITION DIAGNOSIS:   Increased nutrient needs related to post-op healing as evidenced by estimated needs.  Ongoing.  GOAL:   Patient will meet greater than or equal to 90% of their needs  Progressing.  MONITOR:   PO intake, Supplement acceptance, Labs, Weight trends, Skin, I & O's  REASON FOR ASSESSMENT:   Malnutrition Screening Tool    ASSESSMENT:   84 year old female with PMHx of HTN, asthma, hypothyroidism, depression, GERD, spinal stenosis, COPD, CAD, anxiety, CKD, chronic leukocytosis secondary to MDS/MPN on [place] in the past who is admitted after mechanical fall with right hip intertrochanteric fracture.  Patient's PO intake is variable. She ate 0% of dinner last night but 100% of breakfast this morning. She is drinking 1-2 bottles of Ensure per day. Pending placement at [facility] once insurance authorization finalized.   Medications reviewed and include: allopurinol, azithromycin, vitamin D3 1000 units daily, Colace 100 mg BID, levothyroxine, Melatonin 5 mg QHS, Miralax 17 grams daily.  Labs reviewed: CBG 129, Potassium 3.4, BUN 24.  Diet Order:   Diet Order            Diet - low sodium heart healthy        Diet regular Room service appropriate? Yes; Fluid consistency: Thin  Diet effective now             EDUCATION NEEDS:   No education needs have been identified at this time  Skin:  Skin Assessment: Skin Integrity Issues:(closed incision right hip)  Last BM:  07/27/2019 - medium type 5  Height:   Ht Readings from Last 1 Encounters:  07/23/19 5\' 3"  (1.6 m)   Weight:   Wt Readings from Last 1 Encounters:  07/23/19 52 kg   Ideal Body Weight:  52.3 kg  BMI:  Body mass index is 20.31 kg/m.  Estimated Nutritional Needs:   Kcal:  1400-1600  Protein:  70-80  grams  Fluid:  1.4-1.6 L/day  Willey Blade, MS, RD, LDN Office: [phone] Pager: [phone] After Hours/Weekend Pager: [phone]

## 2019-07-28 NOTE — Progress Notes (Signed)
[place] at [place] NAME: Veronica Hubbard    MR#:  [id number]  DATE OF BIRTH:  07-May-1935  SUBJECTIVE:   Patient presents to the hospital after a fall and noted to have a right hip fracture.  Patient is status post right hip intra-medullary pinning postop day #5 today.  She is a bit tearful today as she is upset about some financial issues related to her disposition.  REVIEW OF SYSTEMS:    Review of Systems  Constitutional: Negative for chills and fever.  HENT: Negative for congestion and tinnitus.   Eyes: Negative for blurred vision and double vision.  Respiratory: Negative for cough, shortness of breath and wheezing.   Cardiovascular: Negative for chest pain, orthopnea and PND.  Gastrointestinal: Positive for constipation. Negative for abdominal pain, diarrhea, nausea and vomiting.  Genitourinary: Negative for dysuria and hematuria.  Musculoskeletal: Positive for joint pain (Right Hip). Negative for falls.  Neurological: Negative for dizziness, sensory change and focal weakness.  All other systems reviewed and are negative.   Nutrition: Regular Tolerating Diet: Yes Tolerating PT: Eval noted.    DRUG ALLERGIES:   Allergies  Allergen Reactions  . Aspirin Shortness Of Breath  . Celecoxib Shortness Of Breath  . Iodinated Diagnostic Agents Other (See Comments)    Reaction:  Decreased kidney function  . Losartan Itching  . Morphine Nausea And Vomiting  . Verapamil Itching    VITALS:  Blood pressure 103/67, pulse 78, temperature 98.2 F (36.8 C), temperature source Oral, resp. rate 18, height 5\' 3"  (1.6 m), weight 52 kg, SpO2 99 %.  PHYSICAL EXAMINATION:   Physical Exam  GENERAL:  84 y.o.-year-old patient sitting up in bed a bit tearful but in NAD.  EYES: Pupils equal, round, reactive to light and accommodation. No scleral icterus. Extraocular muscles intact.  HEENT: Head atraumatic, normocephalic. Oropharynx and  nasopharynx clear.  NECK:  Supple, no jugular venous distention. No thyroid enlargement, no tenderness.  LUNGS: Normal breath sounds bilaterally, no wheezing, rales, rhonchi. No use of accessory muscles of respiration.  CARDIOVASCULAR: S1, S2 normal. No murmurs, rubs, or gallops.  ABDOMEN: Soft, nontender, nondistended. Bowel sounds present. No organomegaly or mass.  EXTREMITIES: No cyanosis, clubbing or edema b/l. Right hip/leg dressing in place with no acute drainage.   NEUROLOGIC: Cranial nerves II through XII are intact. No focal Motor or sensory deficits b/l.   PSYCHIATRIC: The patient is alert and oriented x 3. Tearful and anxious.  SKIN: No obvious rash, lesion, or ulcer.    LABORATORY PANEL:   CBC Recent Labs  Lab 07/28/19 0521  WBC 24.5*  HGB 9.1*  HCT 28.9*  PLT 408*   ------------------------------------------------------------------------------------------------------------------  Chemistries  Recent Labs  Lab 07/25/19 0449 07/28/19 0521  NA 136 135  K 4.1 3.4*  CL 108 106  CO2 24 22  GLUCOSE 103* 96  BUN 12 24*  CREATININE 0.70 0.75  CALCIUM 9.1 9.8  MG 2.2  --    ------------------------------------------------------------------------------------------------------------------  Cardiac Enzymes No results for input(s): TROPONINI in the last 168 hours. ------------------------------------------------------------------------------------------------------------------  RADIOLOGY:  No results found.   ASSESSMENT AND PLAN:   84 year old female with past medical history of hypertension, hypothyroidism, GERD, coronary artery disease, COPD, anxiety, spinal stenosis who presents to the hospital after mechanical fall and noted to have a right hip fracture.  1.  Status post fall and right hip fracture- patient is status post right hip intramedullary pinning postop day #4 today. -  Continue pain control with as needed Vicodin. - Awaiting insurance authorization  for discharge to short-term rehab.  2.  Leukocytosis-patient has history of MDS, possible underlying CLL. - Continue follow-up as outpatient.  3.  History of gout-no acute attack.  Continue allopurinol.  4.  COPD-no acute exacerbation.  Continue Breo and Incruse Ellipta  5.  Essential hypertension-continue hydralazine, Imdur.  6.  Hypothyroidism-continue Synthroid.  7.  Depression-continue Celexa.  8. Constipation - improved and cont. Miralax PRN.   Awaiting Insurance auth for discharge to SNF. Will repeat COVID-19 test today.    All the records are reviewed and case discussed with Care Management/Social Worker. Management plans discussed with the patient, family and they are in agreement.  CODE STATUS: DNR  DVT Prophylaxis: Lovenox  TOTAL TIME TAKING CARE OF THIS PATIENT: 25 minutes.   POSSIBLE D/C IN 1-2 DAYS, DEPENDING ON CLINICAL CONDITION.   Henreitta Leber M.D on 07/28/2019 at 3:18 PM  Between 7am to 6pm - Pager - [phone]  After 6pm go to [email or web address] - Proofreader  [facility]  Office  [phone]  CC: Primary care physician; Zhou-Talbert, Elwyn Lade, MD

## 2019-07-28 NOTE — Care Management Important Message (Signed)
Important Message  Patient Details  Name: Adaleah Trejo MRN: [id number] Date of Birth: [birth year]   Medicare Important Message Given:  Yes     Su Hilt, RN 07/28/2019, 11:44 AM

## 2019-07-29 MED ORDER — TRAMADOL HCL 50 MG PO TABS: 50.0000 mg | ORAL_TABLET | Freq: Four times a day (QID) | ORAL | 0 refills | Status: AC | PRN

## 2019-07-29 NOTE — TOC Transition Note (Signed)
Transition of Care [facility]) - CM/SW Discharge Note   Patient Details  Name: Veronica Hubbard MRN: [id number] Date of Birth: 09-Sep-1935  [facility] CM/SW Contact:  Su Hilt, RN Phone Number: 07/29/2019, 2:34 PM   Clinical Narrative:    Spoke with [place] the patients sister.  She understands that the recommendation is for the patient to go to rehab however she is going to take her home with [facility] the patient is open with [facility] at this time and does not need any DME  At home.   Final next level of care: Skilled Nursing Facility Barriers to Discharge: Continued Medical Work up   Patient Goals and CMS Choice        Discharge Placement                       Discharge Plan and Services                                     Social Determinants of Health (SDOH) Interventions     Readmission Risk Interventions No flowsheet data found.

## 2019-07-29 NOTE — Progress Notes (Signed)
Physical Therapy Treatment Patient Details Name: Veronica Hubbard MRN: [id number] DOB: 22-Jun-1935 Today's Date: 07/29/2019    History of Present Illness Pt is an 84 y.o. female s/p R ORIF with IM nailing (07/23/2019), after presenting to ED after a fall at home 09/15. Pt with PMH of HTN, hypothyroidism, chronic leukocytosis secondary to [facility] (chemotherapy) in the past, CAD, COPD, and chronic back pain s/p multi-level kyphoplasties: L1-L2 (05/13/19), L3 (04/10/2019), L3 and L4 (04/22/2019), L5 (07/02/2019) and T12 (07/04/2019).    PT Comments    Pt in bed, picking at dressing and 1/2 peeled back with drainage noted.  RN called to check and replace.  While waiting, she participated in exercises as described below.  Upon finishing ex, pt yelled out that she was cold and just wanted to go back to sleep.  Blankets reapplied and pt left in bed awaiting RN for dressing change.  Will continue as appropriate.   Follow Up Recommendations  SNF     Equipment Recommendations  Rolling walker with 5" wheels    Recommendations for Other Services       Precautions / Restrictions Precautions Precautions: Fall Precaution Comments: Recent history of multiple kyphoplasties Restrictions Weight Bearing Restrictions: Yes RLE Weight Bearing: Weight bearing as tolerated    Mobility  Bed Mobility               General bed mobility comments: deferred due to need of new dressing - pt had removed half of it.  Transfers                    Ambulation/Gait                 Stairs             Wheelchair Mobility    Modified Rankin (Stroke Patients Only)       Balance                                            Cognition Arousal/Alertness: Awake/alert Behavior During Therapy: WFL for tasks assessed/performed Overall Cognitive Status: Within Functional Limits for tasks assessed                                        Exercises  Other Exercises Other Exercises: supine AA/AROM ankle pumps, heel slides, ab/add, slr and SAQ x 10 BLE    General Comments        Pertinent Vitals/Pain Pain Assessment: Faces Faces Pain Scale: Hurts even more Pain Location: R hip Pain Descriptors / Indicators: Aching;Constant;Grimacing Pain Intervention(s): Monitored during session;Limited activity within patient's tolerance    Home Living                      Prior Function            PT Goals (current goals can now be found in the care plan section) Progress towards PT goals: Progressing toward goals    Frequency    BID      PT Plan Current plan remains appropriate    Co-evaluation              AM-PAC PT "6 Clicks" Mobility   Outcome Measure  Help needed turning from your back to your side while in a  flat bed without using bedrails?: A Little Help needed moving from lying on your back to sitting on the side of a flat bed without using bedrails?: A Lot Help needed moving to and from a bed to a chair (including a wheelchair)?: A Little Help needed standing up from a chair using your arms (e.g., wheelchair or bedside chair)?: A Little Help needed to walk in hospital room?: A Lot Help needed climbing 3-5 steps with a railing? : A Lot 6 Click Score: 15    End of Session   Activity Tolerance: Patient limited by fatigue;Other (comment) Patient left: in bed;with call bell/phone within reach;with bed alarm set Nurse Communication: Other (comment) Pain - Right/Left: Right Pain - part of body: Hip     Time: [id number] PT Time Calculation (min) (ACUTE ONLY): 14 min  Charges:  $Therapeutic Exercise: 8-22 mins                     Chesley Noon, PTA 07/29/19, 9:38 AM

## 2019-07-29 NOTE — TOC Progression Note (Signed)
Transition of Care [facility]) - Progression Note    Patient Details  Name: Syncere Guity MRN: [id number] Date of Birth: [birth year]  Transition of Care [facility]) CM/SW [place], RN Phone Number: 07/29/2019, 1:45 PM  Clinical Narrative:    Otila Kluver with [facility] called and stated that they are resending the auth.  I called Claiborne Billings at [facility] and she stated that they can accept and will try to get auth from [place], I asked Otila Kluver at [facility] to release the auth. I called the sister [name] and let her know that [facility] resended the bed offer and explained that she can call them to find out why I let her know another option would be [facility] and she stated no that she did not want her to go to [facility] and she did not want her to go to [facility], I explained the other option would be home with [facility], She stated that she would take her home with home health, she would call [facility] and call me back      Barriers to Discharge: Continued Medical Work up  Expected Discharge Plan and Services           Expected Discharge Date: 07/29/19                                     Social Determinants of Health (SDOH) Interventions    Readmission Risk Interventions No flowsheet data found.

## 2019-07-29 NOTE — Progress Notes (Signed)
   07/29/19 1400  Clinical Encounter Type  Visited With Patient;Health care provider  Visit Type Initial  Referral From Nurse  Consult/Referral To Chaplain  Stress Factors  Patient Stress Factors Health changes   Chaplain received a referral to visit with the patient. Upon arrival, the patient was laying in bed. She was awake, alert, and oriented. The patient was welcoming and expressed appreciation for the visit. She discussed the history of her present illness how a string of surgeries in the past year or so has impacted her well-being. The patient expressed frustration and a sense of hopelessness in trying to understand where God is in all of this. This has been a particularly difficult period for the patient and she is grateful for her sisters. This chaplain provided support in the form of active and reflective listening, compassionate ministerial presence, encouragement, and space for theological reflection. During the visit, the patient received a phone call from her sister. This chaplain will check back in with the patient shortly.

## 2019-07-29 NOTE — TOC Transition Note (Signed)
Transition of Care [facility]) - CM/SW Discharge Note   Patient Details  Name: Veronica Hubbard MRN: [id number] Date of Birth: 22-Aug-1935  Transition of Care [facility]) CM/SW Contact:  Su Hilt, RN Phone Number: 07/29/2019, 9:41 AM   Clinical Narrative:    Spoke with the sister and the patient , I notified them that insurance has approved the rehab stay and that the patient will be going to rehab to [facility] today to room 713, [name] asked me about the copay. I let her know she would need to work that out with the facility, she had talked to them yesterday about it.   Final next level of care: Skilled Nursing Facility Barriers to Discharge: Continued Medical Work up   Patient Goals and CMS Choice        Discharge Placement                       Discharge Plan and Services                                     Social Determinants of Health (SDOH) Interventions     Readmission Risk Interventions No flowsheet data found.

## 2019-07-29 NOTE — Progress Notes (Signed)
Patient discharged from unit via wheelchair [facility]. All discharge information reviewed with patient/sister. Patient will have [facility] services in the home.  Patient reminded to follow hip precautions as instructed by PT. All personal belongings with patient.

## 2019-07-29 NOTE — Progress Notes (Signed)
Dark red, old blood noted on right surgical hip dressing.  MD notified that hip incision evacuated a 50 cent piece size congealed dark red, marroon, old hematoma.  Hip cleansed and new hydrocolloid dressing applied.  No new orders from MD.

## 2019-08-08 ENCOUNTER — Telehealth: Payer: Self-pay | Admitting: Nurse Practitioner

## 2019-08-08 DIAGNOSIS — S72111D Displaced fracture of greater trochanter of right femur, subsequent encounter for closed fracture with routine healing: Secondary | ICD-10-CM | POA: Diagnosis not present

## 2019-08-08 DIAGNOSIS — J449 Chronic obstructive pulmonary disease, unspecified: Secondary | ICD-10-CM | POA: Diagnosis not present

## 2019-08-08 DIAGNOSIS — E876 Hypokalemia: Secondary | ICD-10-CM | POA: Diagnosis not present

## 2019-08-08 DIAGNOSIS — D631 Anemia in chronic kidney disease: Secondary | ICD-10-CM | POA: Diagnosis not present

## 2019-08-08 DIAGNOSIS — M109 Gout, unspecified: Secondary | ICD-10-CM | POA: Diagnosis not present

## 2019-08-08 DIAGNOSIS — I2510 Atherosclerotic heart disease of native coronary artery without angina pectoris: Secondary | ICD-10-CM | POA: Diagnosis not present

## 2019-08-08 DIAGNOSIS — I129 Hypertensive chronic kidney disease with stage 1 through stage 4 chronic kidney disease, or unspecified chronic kidney disease: Secondary | ICD-10-CM | POA: Diagnosis not present

## 2019-08-08 DIAGNOSIS — S72141D Displaced intertrochanteric fracture of right femur, subsequent encounter for closed fracture with routine healing: Secondary | ICD-10-CM | POA: Diagnosis not present

## 2019-08-08 DIAGNOSIS — I350 Nonrheumatic aortic (valve) stenosis: Secondary | ICD-10-CM | POA: Diagnosis not present

## 2019-08-08 DIAGNOSIS — F17210 Nicotine dependence, cigarettes, uncomplicated: Secondary | ICD-10-CM | POA: Diagnosis not present

## 2019-08-08 DIAGNOSIS — D469 Myelodysplastic syndrome, unspecified: Secondary | ICD-10-CM | POA: Diagnosis not present

## 2019-08-08 DIAGNOSIS — Z9181 History of falling: Secondary | ICD-10-CM | POA: Diagnosis not present

## 2019-08-08 DIAGNOSIS — W19XXXD Unspecified fall, subsequent encounter: Secondary | ICD-10-CM | POA: Diagnosis not present

## 2019-08-08 DIAGNOSIS — M4800 Spinal stenosis, site unspecified: Secondary | ICD-10-CM | POA: Diagnosis not present

## 2019-08-08 DIAGNOSIS — Z7951 Long term (current) use of inhaled steroids: Secondary | ICD-10-CM | POA: Diagnosis not present

## 2019-08-08 DIAGNOSIS — N189 Chronic kidney disease, unspecified: Secondary | ICD-10-CM | POA: Diagnosis not present

## 2019-08-08 DIAGNOSIS — K219 Gastro-esophageal reflux disease without esophagitis: Secondary | ICD-10-CM | POA: Diagnosis not present

## 2019-08-08 DIAGNOSIS — I6523 Occlusion and stenosis of bilateral carotid arteries: Secondary | ICD-10-CM | POA: Diagnosis not present

## 2019-08-08 DIAGNOSIS — E039 Hypothyroidism, unspecified: Secondary | ICD-10-CM | POA: Diagnosis not present

## 2019-08-08 DIAGNOSIS — L89322 Pressure ulcer of left buttock, stage 2: Secondary | ICD-10-CM | POA: Diagnosis not present

## 2019-08-08 DIAGNOSIS — F341 Dysthymic disorder: Secondary | ICD-10-CM | POA: Diagnosis not present

## 2019-08-08 NOTE — Telephone Encounter (Signed)
Called patient X5 to schedule Palliative Consult, it was busy.  I then called patient's cell number, no answer - unable to leave a message due to no voicemail set up.

## 2019-08-11 ENCOUNTER — Telehealth: Payer: Self-pay | Admitting: Nurse Practitioner

## 2019-08-12 ENCOUNTER — Other Ambulatory Visit: Payer: Self-pay

## 2019-08-12 ENCOUNTER — Encounter: Payer: Self-pay | Admitting: Nurse Practitioner

## 2019-08-12 ENCOUNTER — Other Ambulatory Visit: Payer: Medicare Other | Admitting: Nurse Practitioner

## 2019-08-12 DIAGNOSIS — S72111D Displaced fracture of greater trochanter of right femur, subsequent encounter for closed fracture with routine healing: Secondary | ICD-10-CM | POA: Diagnosis not present

## 2019-08-12 DIAGNOSIS — N189 Chronic kidney disease, unspecified: Secondary | ICD-10-CM | POA: Diagnosis not present

## 2019-08-12 DIAGNOSIS — Z515 Encounter for palliative care: Secondary | ICD-10-CM | POA: Diagnosis not present

## 2019-08-12 DIAGNOSIS — I2510 Atherosclerotic heart disease of native coronary artery without angina pectoris: Secondary | ICD-10-CM | POA: Diagnosis not present

## 2019-08-12 DIAGNOSIS — I129 Hypertensive chronic kidney disease with stage 1 through stage 4 chronic kidney disease, or unspecified chronic kidney disease: Secondary | ICD-10-CM | POA: Diagnosis not present

## 2019-08-12 DIAGNOSIS — L89322 Pressure ulcer of left buttock, stage 2: Secondary | ICD-10-CM | POA: Diagnosis not present

## 2019-08-12 DIAGNOSIS — S72141D Displaced intertrochanteric fracture of right femur, subsequent encounter for closed fracture with routine healing: Secondary | ICD-10-CM | POA: Diagnosis not present

## 2019-08-12 DIAGNOSIS — S72144A Nondisplaced intertrochanteric fracture of right femur, initial encounter for closed fracture: Secondary | ICD-10-CM | POA: Diagnosis not present

## 2019-08-12 NOTE — Telephone Encounter (Signed)
Called patient to schedule Palliative Consult and spoke with sister, [place], after discussing Palliative services she was in agreement with this.  I have scheduled a Telephone Consult for 08/12/19 @ 9 AM.

## 2019-08-12 NOTE — Progress Notes (Signed)
[facility] Consult Note Telephone: [phone]  Fax: [phone]  PATIENT NAME: Veronica Hubbard DOB: 08-Jul-1935 MRN: [id number]  PRIMARY CARE PROVIDER:   Alfonse Flavors, MD  REFERRING PROVIDER:  Alfonse Flavors, MD [address] [place],  [place] [address]  RESPONSIBLE PARTY:   Self; Veronica. Percell Hubbard, sister  Due to the COVID-19 crisis, this visit was done via telemedicine from my office and it was initiated and consent by this patient and or family.  RECOMMENDATIONS and PLAN:  1. ACP: DNR; will put in Vynca/Epic; Hospice referral, medical goals of care to focus on comfort  2. Nausea and vomiting continue antimedic; encourage oral hydration  3. Palliative care encounter Palliative medicine team will continue to support patient, patient's family, and medical team. Visit consisted of counseling and education dealing with the complex and emotionally intense issues of symptom management and palliative care in the setting of serious and potentially life-threatening illness  I spent 65 minutes providing this consultation,  from 8:55am to 10:00am. More than 50% of the time in this consultation was spent coordinating communication.   HISTORY OF PRESENT ILLNESS:  Veronica Hubbard is a 84 y.o. year old female with multiple medical problems including Bone marrow cancer chemotherapy, thoracic  aortic aneurysm, abdominal aortic aneurysm, bilateral carotid, COPD, coronary artery disease, spinal stenosis, hypothyroidism, hypertension, gerd, anemia, aortic anomaly, compression fracture of lumbar spine asthma, anxiety, depression, kyphoplasty, right and trim medullary nail, breast surgery, left breast biopsy, abdominal hysterectomy, shoulder surgery. 6 / 12 / 2020 to 6 / 21 / 2020 severe low back pain had a recent L3 capacity on 6 / 4 / 2020. MRI of Lumbar and showed a new L4 compression fracture. 7 / 5 / 2020 to 7 / 13 / 2020  compression fracture of lumbar spine with worsening back pain and MRI revealing new acute minimal L1 superior endplate compression fracture with 10% height loss and trace repulsion. 7 / 14 / 2020 to 7 / 20 / 2020 worsening back pain s/p L1-L2 kyphoplasty done 6 / 7 / 2020. Acute onset abdominal pain history of gastroenteritis due to norovirus. Symptoms improved. Acute on chronic leukocytosis with history of MDS/MPN and was on jakafi in the past followed by [facility]. Hospitalized 9 / 15 / 2020 to 14 / 22 / 2020 for a fall and right hip fracture s/p pin. Pain controlled on tramadol. Leukocytosis with history of MDS and possible underlying CLL. She is a DNR. She lives at home and her sister has been staying with her. I called for scheduled telemedicine, telephonic palliative care initial visit. I talked with Veronica Hubbard/Veronica Hubbard in agreement. We talked about past medical history in the setting of chronic disease. We talked about diagnosis of aneurysm about 3 years ago. We talked about cancer diagnosis, followed up at [facility] and did try chemotherapy though due to side effects stopped. No further treatment options offered. She does continue. Chronic nausea for what she is out of her nausea medicine. We talked about symptoms of pain what she does experience intermittent which she takes Tylenol with relief. We talked about her functional level for what she is able to transfer to the wheelchair but difficulty standing. She does require ADL assistance. She does better self with a very poor appetite. Veronica Hubbard endorses she used to weigh a 160lbs and now she is down to a 100lbs over the last six months. We talked about medical goals of care, aggressive  versus comfort care. We talked about code status as she is a DNR. They do have [place] form. We talked about completing a new [place] to be able to place in [employer] and will mail. Veronica Hubbard in agreement. We talked about Life review. We talked about role of palliative care and  plan of care. We talked about option of Hospice Services Under Medicare benefit and what is provided. Veronica Hubbard endorses she would like to have Hospice Services. Discuss will call [facility] for order and refill antiemetics. Veronica Hubbard in agreement. Questions answered to satisfaction. Contact information provided. Therapeutic listening and emotional support provided.  Palliative Care was asked to help address goals of care.   CODE STATUS: DNR  PPS: 40% HOSPICE ELIGIBILITY/DIAGNOSIS: TBD  PAST MEDICAL HISTORY:  Past Medical History:  Diagnosis Date  . Anemia   . Anxiety   . Aortic anomaly    stenosis  . Asthma   . Cancer ([place]) 02/2018   bone marrow cancer  . Chronic kidney disease   . COPD (chronic obstructive pulmonary disease) ([place])   . Coronary artery disease   . Depression   . GERD (gastroesophageal reflux disease)   . Hypertension   . Hypothyroidism   . Personal history of chemotherapy   . Spinal stenosis     SOCIAL HX:  Social History   Tobacco Use  . Smoking status: Current Every Day Smoker    Types: Cigarettes  . Smokeless tobacco: Never Used  Substance Use Topics  . Alcohol use: No    ALLERGIES:  Allergies  Allergen Reactions  . Aspirin Shortness Of Breath  . Celecoxib Shortness Of Breath  . Iodinated Diagnostic Agents Other (See Comments)    Reaction:  Decreased kidney function  . Losartan Itching  . Morphine Nausea And Vomiting  . Verapamil Itching     PERTINENT MEDICATIONS:  Outpatient Encounter Medications as of 08/12/2019  Medication Sig  . acetaminophen (TYLENOL) 325 MG tablet Take 2 tablets (650 mg total) by mouth every 6 (six) hours as needed for mild pain (or Fever >/= 101).  Marland Kitchen albuterol (PROVENTIL) (2.5 MG/3ML) 0.083% nebulizer solution Take 2.5 mg by nebulization every 6 (six) hours as needed for wheezing or shortness of breath.  Marland Kitchen alendronate (FOSAMAX) 70 MG tablet Take 70 mg by mouth every Monday. Take with a full glass of  water on an empty stomach.  Marland Kitchen allopurinol (ZYLOPRIM) 100 MG tablet Take 200 mg by mouth daily.  Marland Kitchen azithromycin (ZITHROMAX) 250 MG tablet Take 250 mg by mouth every Monday, Wednesday, and Friday.  . Cholecalciferol (VITAMIN D3) 25 MCG (1000 UT) CAPS Take 1,000 Units by mouth daily.  . citalopram (CELEXA) 20 MG tablet Take 20 mg by mouth daily.  . cyclobenzaprine (FLEXERIL) 5 MG tablet Take 5 mg by mouth 3 (three) times daily.   Marland Kitchen enoxaparin (LOVENOX) 40 MG/0.4ML injection Inject 0.4 mLs (40 mg total) into the skin daily for 28 days.  . hydrALAZINE (APRESOLINE) 50 MG tablet Take 1 tablet (50 mg total) by mouth every 8 (eight) hours.  . isosorbide mononitrate (IMDUR) 30 MG 24 hr tablet Take 30 mg by mouth daily.  Marland Kitchen levothyroxine (SYNTHROID) 150 MCG tablet Take 150 mcg by mouth daily before breakfast.   . lidocaine (LIDODERM) 5 % Place 1 patch onto the skin every 12 (twelve) hours. Remove & Discard patch within 12 hours or as directed by MD  . Melatonin 5 MG TABS Take 5 mg by mouth at bedtime.  Marland Kitchen  metoCLOPramide (REGLAN) 5 MG tablet Take 1 tablet (5 mg total) by mouth every 8 (eight) hours as needed for nausea or refractory nausea / vomiting.  . metoprolol tartrate (LOPRESSOR) 25 MG tablet Take 1 tablet (25 mg total) by mouth 2 (two) times daily.  . mirtazapine (REMERON) 7.5 MG tablet Take 1 tablet by mouth daily.  . Omega-3 Fatty Acids (FISH OIL) 1000 MG CAPS Take 1,000 mg by mouth at bedtime.  . pantoprazole (PROTONIX) 40 MG tablet Take 40 mg by mouth 2 (two) times daily.  . polyethylene glycol (MIRALAX / GLYCOLAX) 17 g packet Take 17 g by mouth daily as needed. (Patient taking differently: Take 17 g by mouth daily as needed (constipation.). )  . traMADol (ULTRAM) 50 MG tablet Take 1 tablet (50 mg total) by mouth every 6 (six) hours as needed for moderate pain.  . TRELEGY ELLIPTA 100-62.5-25 MCG/INH AEPB Inhale 1 puff into the lungs at bedtime.  . triamcinolone cream (KENALOG) 0.5 % Apply 1  application topically 2 (two) times daily as needed (skin rash).    No facility-administered encounter medications on file as of 08/12/2019.     PHYSICAL EXAM:   Deferred  Cristabel Bicknell Z Denea Cheaney, NP

## 2019-08-13 DIAGNOSIS — I10 Essential (primary) hypertension: Secondary | ICD-10-CM | POA: Diagnosis not present

## 2019-08-13 DIAGNOSIS — Z90710 Acquired absence of both cervix and uterus: Secondary | ICD-10-CM | POA: Diagnosis not present

## 2019-08-13 DIAGNOSIS — J449 Chronic obstructive pulmonary disease, unspecified: Secondary | ICD-10-CM | POA: Diagnosis not present

## 2019-08-13 DIAGNOSIS — Z8679 Personal history of other diseases of the circulatory system: Secondary | ICD-10-CM | POA: Diagnosis not present

## 2019-08-13 DIAGNOSIS — K219 Gastro-esophageal reflux disease without esophagitis: Secondary | ICD-10-CM | POA: Diagnosis not present

## 2019-08-13 DIAGNOSIS — Z9889 Other specified postprocedural states: Secondary | ICD-10-CM | POA: Diagnosis not present

## 2019-08-13 DIAGNOSIS — I2510 Atherosclerotic heart disease of native coronary artery without angina pectoris: Secondary | ICD-10-CM | POA: Diagnosis not present

## 2019-08-13 DIAGNOSIS — F329 Major depressive disorder, single episode, unspecified: Secondary | ICD-10-CM | POA: Diagnosis not present

## 2019-08-13 DIAGNOSIS — S72001D Fracture of unspecified part of neck of right femur, subsequent encounter for closed fracture with routine healing: Secondary | ICD-10-CM | POA: Diagnosis not present

## 2019-08-13 DIAGNOSIS — R634 Abnormal weight loss: Secondary | ICD-10-CM | POA: Diagnosis not present

## 2019-08-13 DIAGNOSIS — M4856XD Collapsed vertebra, not elsewhere classified, lumbar region, subsequent encounter for fracture with routine healing: Secondary | ICD-10-CM | POA: Diagnosis not present

## 2019-08-13 DIAGNOSIS — F419 Anxiety disorder, unspecified: Secondary | ICD-10-CM | POA: Diagnosis not present

## 2019-08-13 DIAGNOSIS — D469 Myelodysplastic syndrome, unspecified: Secondary | ICD-10-CM | POA: Diagnosis not present

## 2019-08-13 DIAGNOSIS — M549 Dorsalgia, unspecified: Secondary | ICD-10-CM | POA: Diagnosis not present

## 2019-08-13 DIAGNOSIS — D630 Anemia in neoplastic disease: Secondary | ICD-10-CM | POA: Diagnosis not present

## 2019-08-13 DIAGNOSIS — R627 Adult failure to thrive: Secondary | ICD-10-CM | POA: Diagnosis not present

## 2019-08-13 DIAGNOSIS — E039 Hypothyroidism, unspecified: Secondary | ICD-10-CM | POA: Diagnosis not present

## 2019-08-13 DIAGNOSIS — M4800 Spinal stenosis, site unspecified: Secondary | ICD-10-CM | POA: Diagnosis not present

## 2019-08-14 DIAGNOSIS — I10 Essential (primary) hypertension: Secondary | ICD-10-CM | POA: Diagnosis not present

## 2019-08-14 DIAGNOSIS — F419 Anxiety disorder, unspecified: Secondary | ICD-10-CM | POA: Diagnosis not present

## 2019-08-14 DIAGNOSIS — J449 Chronic obstructive pulmonary disease, unspecified: Secondary | ICD-10-CM | POA: Diagnosis not present

## 2019-08-14 DIAGNOSIS — D630 Anemia in neoplastic disease: Secondary | ICD-10-CM | POA: Diagnosis not present

## 2019-08-14 DIAGNOSIS — D469 Myelodysplastic syndrome, unspecified: Secondary | ICD-10-CM | POA: Diagnosis not present

## 2019-08-14 DIAGNOSIS — I2510 Atherosclerotic heart disease of native coronary artery without angina pectoris: Secondary | ICD-10-CM | POA: Diagnosis not present

## 2019-08-15 DIAGNOSIS — I10 Essential (primary) hypertension: Secondary | ICD-10-CM | POA: Diagnosis not present

## 2019-08-15 DIAGNOSIS — J449 Chronic obstructive pulmonary disease, unspecified: Secondary | ICD-10-CM | POA: Diagnosis not present

## 2019-08-15 DIAGNOSIS — F419 Anxiety disorder, unspecified: Secondary | ICD-10-CM | POA: Diagnosis not present

## 2019-08-15 DIAGNOSIS — D469 Myelodysplastic syndrome, unspecified: Secondary | ICD-10-CM | POA: Diagnosis not present

## 2019-08-15 DIAGNOSIS — I2510 Atherosclerotic heart disease of native coronary artery without angina pectoris: Secondary | ICD-10-CM | POA: Diagnosis not present

## 2019-08-15 DIAGNOSIS — D630 Anemia in neoplastic disease: Secondary | ICD-10-CM | POA: Diagnosis not present

## 2019-08-18 DIAGNOSIS — D469 Myelodysplastic syndrome, unspecified: Secondary | ICD-10-CM | POA: Diagnosis not present

## 2019-08-18 DIAGNOSIS — F419 Anxiety disorder, unspecified: Secondary | ICD-10-CM | POA: Diagnosis not present

## 2019-08-18 DIAGNOSIS — I10 Essential (primary) hypertension: Secondary | ICD-10-CM | POA: Diagnosis not present

## 2019-08-18 DIAGNOSIS — I2510 Atherosclerotic heart disease of native coronary artery without angina pectoris: Secondary | ICD-10-CM | POA: Diagnosis not present

## 2019-08-18 DIAGNOSIS — D630 Anemia in neoplastic disease: Secondary | ICD-10-CM | POA: Diagnosis not present

## 2019-08-18 DIAGNOSIS — J449 Chronic obstructive pulmonary disease, unspecified: Secondary | ICD-10-CM | POA: Diagnosis not present

## 2019-08-19 DIAGNOSIS — I10 Essential (primary) hypertension: Secondary | ICD-10-CM | POA: Diagnosis not present

## 2019-08-19 DIAGNOSIS — F419 Anxiety disorder, unspecified: Secondary | ICD-10-CM | POA: Diagnosis not present

## 2019-08-19 DIAGNOSIS — D630 Anemia in neoplastic disease: Secondary | ICD-10-CM | POA: Diagnosis not present

## 2019-08-19 DIAGNOSIS — I2510 Atherosclerotic heart disease of native coronary artery without angina pectoris: Secondary | ICD-10-CM | POA: Diagnosis not present

## 2019-08-19 DIAGNOSIS — D469 Myelodysplastic syndrome, unspecified: Secondary | ICD-10-CM | POA: Diagnosis not present

## 2019-08-19 DIAGNOSIS — J449 Chronic obstructive pulmonary disease, unspecified: Secondary | ICD-10-CM | POA: Diagnosis not present

## 2019-08-19 IMAGING — CT CT HEAD WITHOUT CONTRAST
3 of 4 series · 15 of 47 positions shown, 18 images · non-contrast
Comparison: None.

CLINICAL DATA: Altered mental status, worse since last night.
Encephalopathy.

EXAM:
CT HEAD WITHOUT CONTRAST
TECHNIQUE: Contiguous axial images were obtained from the base of the skull
through the vertex without intravenous contrast.

[Series 3: head wo · axial · 0.47mm/px · z∈[-129,-9]mm · 9 of 28 slices shown, 12 images]
[im 2/28  brain]
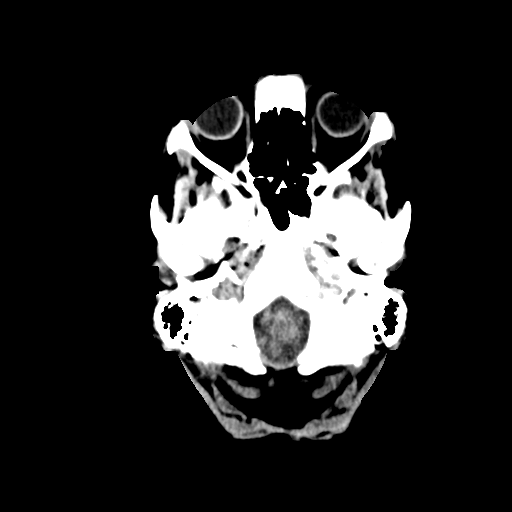
[im 2/28  bone]
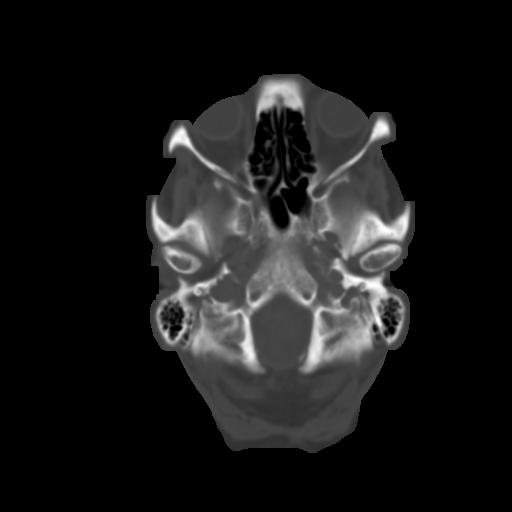
[im 6/28  brain]
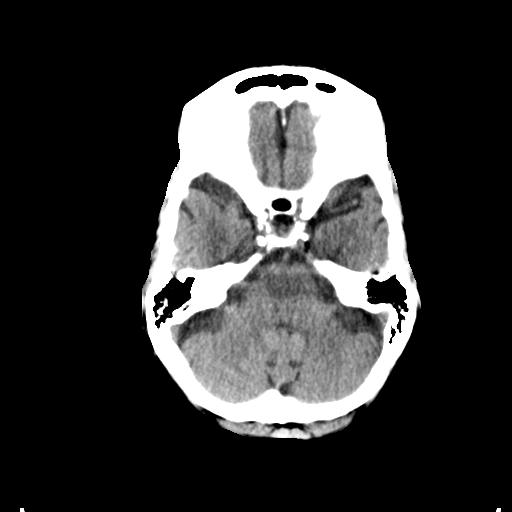
[im 8/28  brain]
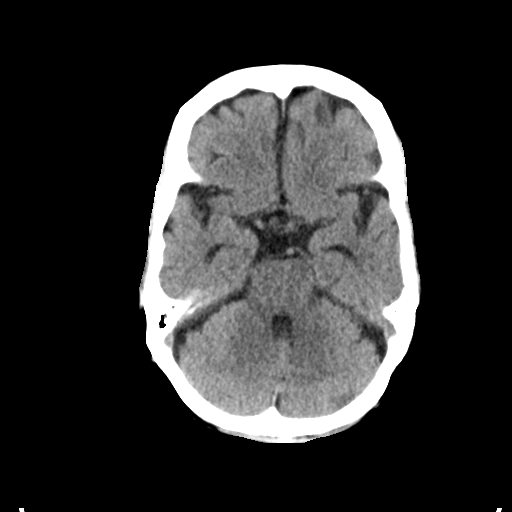
[im 12/28  brain]
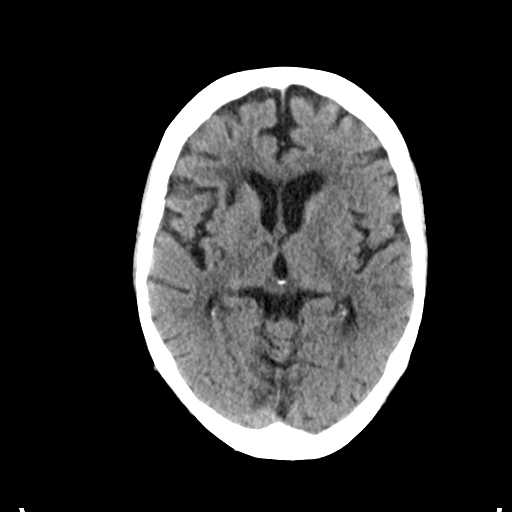
[im 14/28  brain]
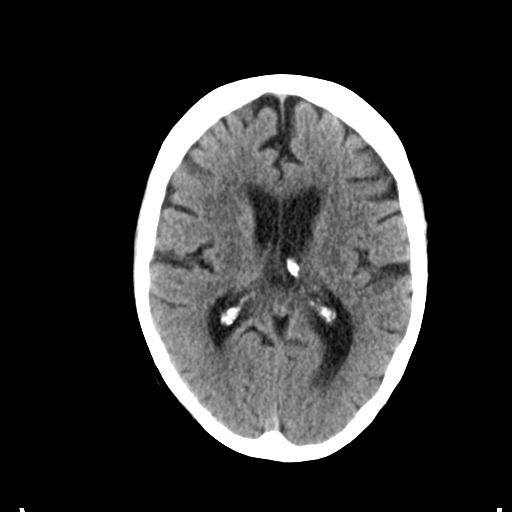
[im 14/28  bone]
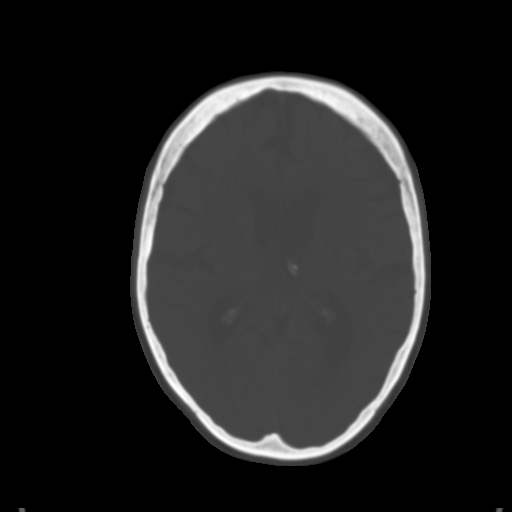
[im 16/28  brain]
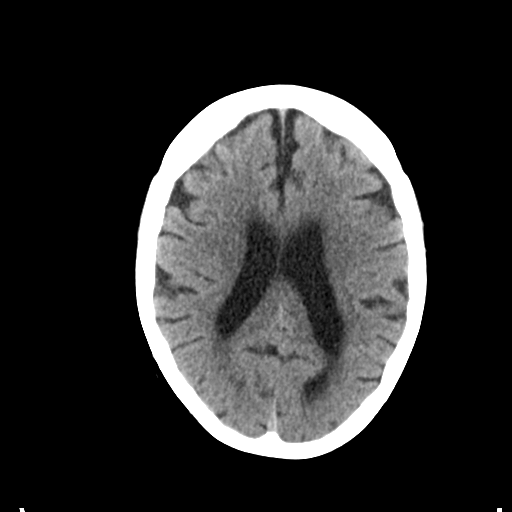
[im 20/28  brain]
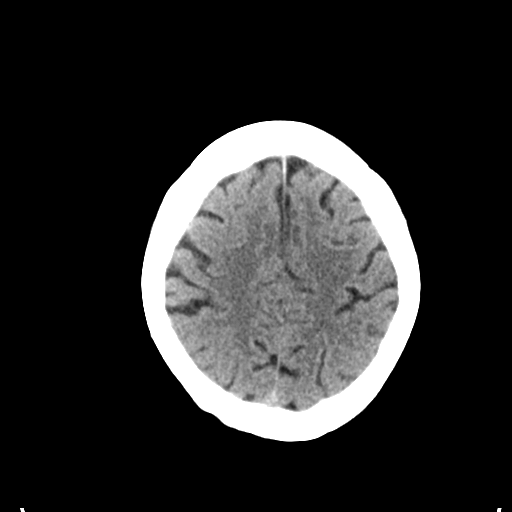
[im 22/28  brain]
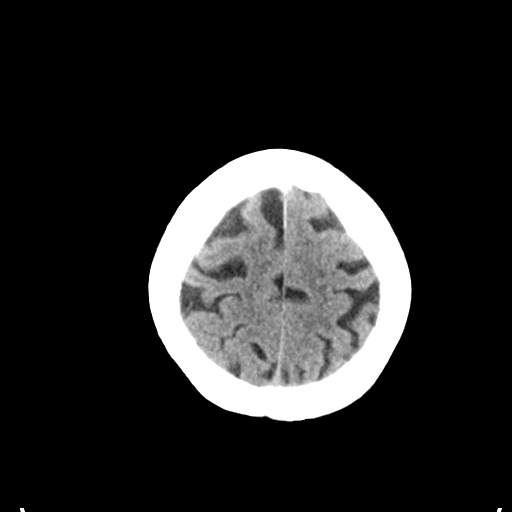
[im 26/28  brain]
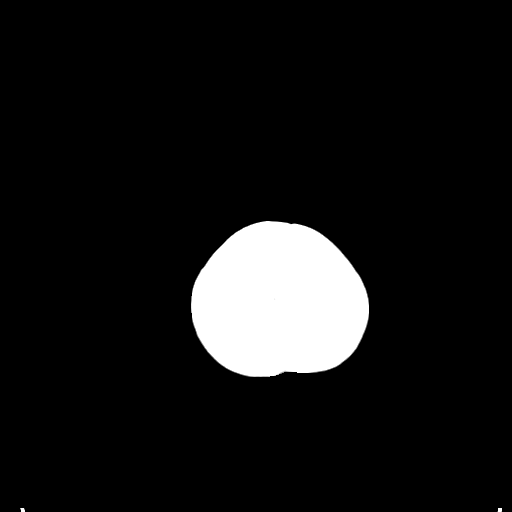
[im 26/28  bone]
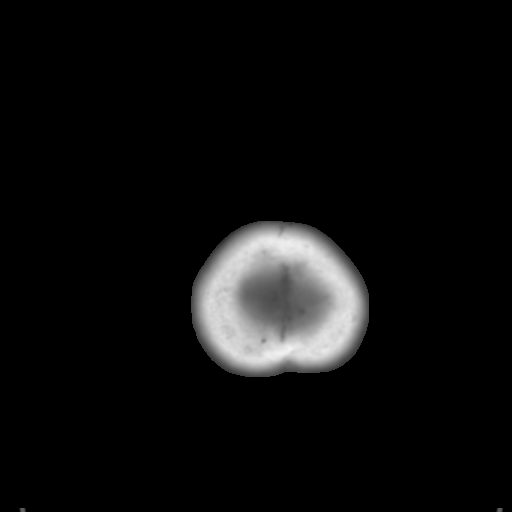

[Series 4: coronal soft tissue · coronal · 0.29mm/px · 3 of 65 slices shown]
[im 22/65  brain]
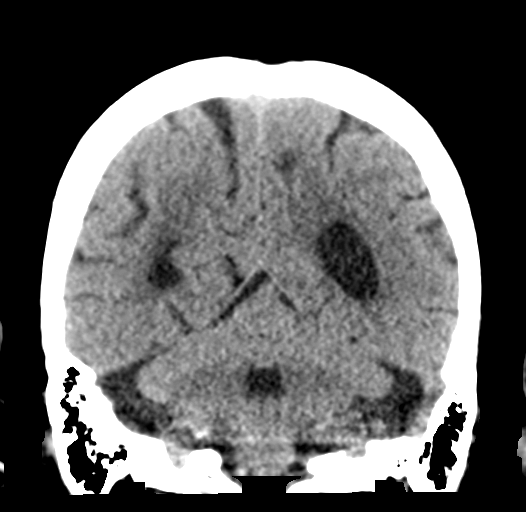
[im 29/65  brain]
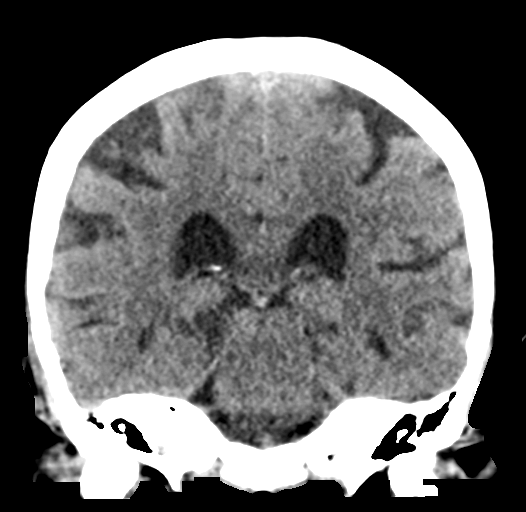
[im 36/65  brain]
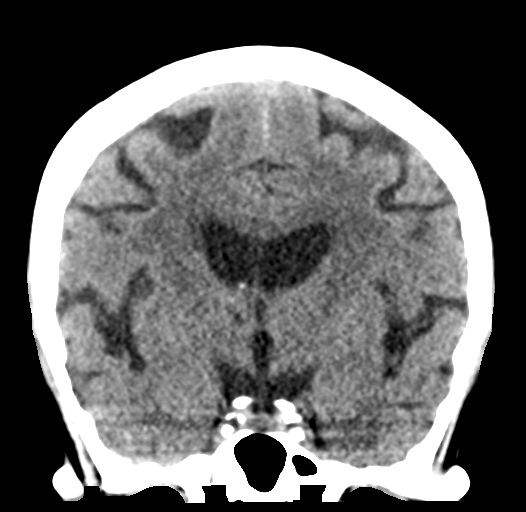

[Series 5: sagittal soft tissue · sagittal · 0.29mm/px · 3 of 52 slices shown]
[im 18/52  brain]
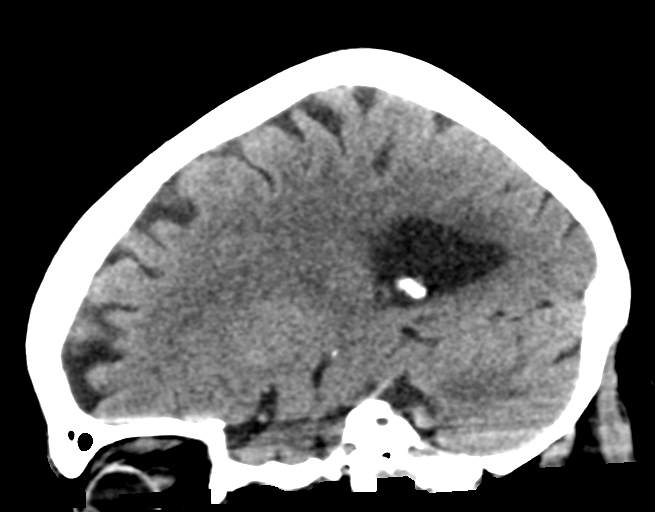
[im 26/52  brain]
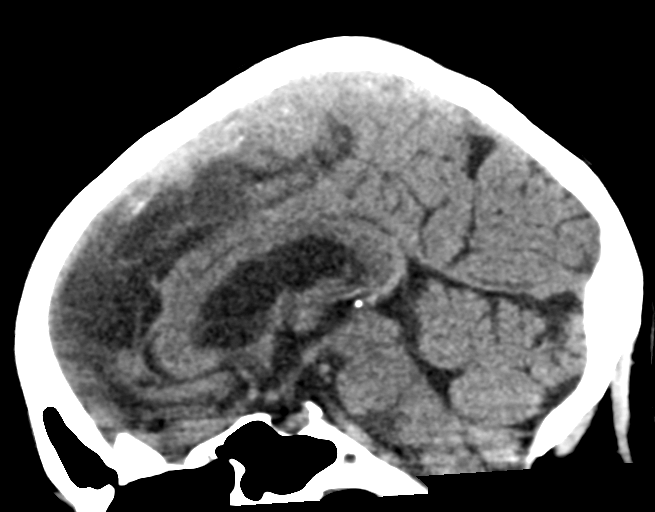
[im 35/52  brain]
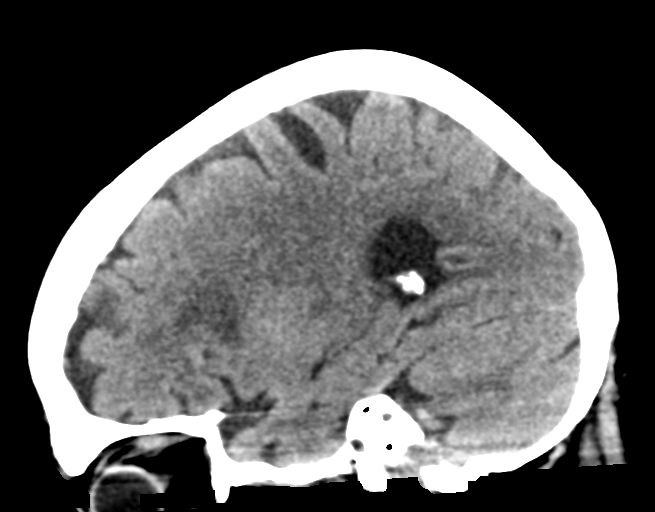

[15 of 47 positions shown; findings below may reference images not displayed]

FINDINGS: Brain: Mildly enlarged ventricles and cortical sulci. Mild patchy
white matter low density in both cerebral hemispheres. Old right
thalamic lacunar infarct. Old right frontal external
capsular/subinsular infarct. No intracranial hemorrhage, mass lesion
or CT evidence of acute infarction.

Vascular: No hyperdense vessel or unexpected calcification.

Skull: Normal. Negative for fracture or focal lesion.

Sinuses/Orbits: Status post bilateral cataract extraction.
Unremarkable bones and included paranasal sinuses.

Other: None.
IMPRESSION: 1. No acute abnormality.
2. Mild diffuse cerebral and cerebellar atrophy.
3. Mild chronic small vessel white matter ischemic changes in both
cerebral hemispheres.
4. Old right frontal external capsular/subinsular infarct and old
right thalamic lacunar infarct.

## 2019-08-21 DIAGNOSIS — I2510 Atherosclerotic heart disease of native coronary artery without angina pectoris: Secondary | ICD-10-CM | POA: Diagnosis not present

## 2019-08-21 DIAGNOSIS — D630 Anemia in neoplastic disease: Secondary | ICD-10-CM | POA: Diagnosis not present

## 2019-08-21 DIAGNOSIS — J449 Chronic obstructive pulmonary disease, unspecified: Secondary | ICD-10-CM | POA: Diagnosis not present

## 2019-08-21 DIAGNOSIS — I10 Essential (primary) hypertension: Secondary | ICD-10-CM | POA: Diagnosis not present

## 2019-08-21 DIAGNOSIS — D469 Myelodysplastic syndrome, unspecified: Secondary | ICD-10-CM | POA: Diagnosis not present

## 2019-08-21 DIAGNOSIS — F419 Anxiety disorder, unspecified: Secondary | ICD-10-CM | POA: Diagnosis not present

## 2019-08-23 DIAGNOSIS — I10 Essential (primary) hypertension: Secondary | ICD-10-CM | POA: Diagnosis not present

## 2019-08-23 DIAGNOSIS — D469 Myelodysplastic syndrome, unspecified: Secondary | ICD-10-CM | POA: Diagnosis not present

## 2019-08-23 DIAGNOSIS — I2510 Atherosclerotic heart disease of native coronary artery without angina pectoris: Secondary | ICD-10-CM | POA: Diagnosis not present

## 2019-08-23 DIAGNOSIS — F419 Anxiety disorder, unspecified: Secondary | ICD-10-CM | POA: Diagnosis not present

## 2019-08-23 DIAGNOSIS — J449 Chronic obstructive pulmonary disease, unspecified: Secondary | ICD-10-CM | POA: Diagnosis not present

## 2019-08-23 DIAGNOSIS — D630 Anemia in neoplastic disease: Secondary | ICD-10-CM | POA: Diagnosis not present

## 2019-08-26 DIAGNOSIS — F419 Anxiety disorder, unspecified: Secondary | ICD-10-CM | POA: Diagnosis not present

## 2019-08-26 DIAGNOSIS — I10 Essential (primary) hypertension: Secondary | ICD-10-CM | POA: Diagnosis not present

## 2019-08-26 DIAGNOSIS — I2510 Atherosclerotic heart disease of native coronary artery without angina pectoris: Secondary | ICD-10-CM | POA: Diagnosis not present

## 2019-08-26 DIAGNOSIS — D469 Myelodysplastic syndrome, unspecified: Secondary | ICD-10-CM | POA: Diagnosis not present

## 2019-08-26 DIAGNOSIS — D630 Anemia in neoplastic disease: Secondary | ICD-10-CM | POA: Diagnosis not present

## 2019-08-26 DIAGNOSIS — J449 Chronic obstructive pulmonary disease, unspecified: Secondary | ICD-10-CM | POA: Diagnosis not present

## 2019-08-28 DIAGNOSIS — I10 Essential (primary) hypertension: Secondary | ICD-10-CM | POA: Diagnosis not present

## 2019-08-28 DIAGNOSIS — D630 Anemia in neoplastic disease: Secondary | ICD-10-CM | POA: Diagnosis not present

## 2019-08-28 DIAGNOSIS — I2510 Atherosclerotic heart disease of native coronary artery without angina pectoris: Secondary | ICD-10-CM | POA: Diagnosis not present

## 2019-08-28 DIAGNOSIS — J449 Chronic obstructive pulmonary disease, unspecified: Secondary | ICD-10-CM | POA: Diagnosis not present

## 2019-08-28 DIAGNOSIS — D469 Myelodysplastic syndrome, unspecified: Secondary | ICD-10-CM | POA: Diagnosis not present

## 2019-08-28 DIAGNOSIS — F419 Anxiety disorder, unspecified: Secondary | ICD-10-CM | POA: Diagnosis not present

## 2019-08-29 DIAGNOSIS — D469 Myelodysplastic syndrome, unspecified: Secondary | ICD-10-CM | POA: Diagnosis not present

## 2019-08-29 DIAGNOSIS — J449 Chronic obstructive pulmonary disease, unspecified: Secondary | ICD-10-CM | POA: Diagnosis not present

## 2019-08-29 DIAGNOSIS — I10 Essential (primary) hypertension: Secondary | ICD-10-CM | POA: Diagnosis not present

## 2019-08-29 DIAGNOSIS — F419 Anxiety disorder, unspecified: Secondary | ICD-10-CM | POA: Diagnosis not present

## 2019-08-29 DIAGNOSIS — D630 Anemia in neoplastic disease: Secondary | ICD-10-CM | POA: Diagnosis not present

## 2019-08-29 DIAGNOSIS — I2510 Atherosclerotic heart disease of native coronary artery without angina pectoris: Secondary | ICD-10-CM | POA: Diagnosis not present

## 2019-09-01 DIAGNOSIS — F419 Anxiety disorder, unspecified: Secondary | ICD-10-CM | POA: Diagnosis not present

## 2019-09-01 DIAGNOSIS — D469 Myelodysplastic syndrome, unspecified: Secondary | ICD-10-CM | POA: Diagnosis not present

## 2019-09-01 DIAGNOSIS — I2510 Atherosclerotic heart disease of native coronary artery without angina pectoris: Secondary | ICD-10-CM | POA: Diagnosis not present

## 2019-09-01 DIAGNOSIS — J449 Chronic obstructive pulmonary disease, unspecified: Secondary | ICD-10-CM | POA: Diagnosis not present

## 2019-09-01 DIAGNOSIS — D630 Anemia in neoplastic disease: Secondary | ICD-10-CM | POA: Diagnosis not present

## 2019-09-01 DIAGNOSIS — I10 Essential (primary) hypertension: Secondary | ICD-10-CM | POA: Diagnosis not present

## 2019-09-07 DIAGNOSIS — Z90710 Acquired absence of both cervix and uterus: Secondary | ICD-10-CM | POA: Diagnosis not present

## 2019-09-07 DIAGNOSIS — D469 Myelodysplastic syndrome, unspecified: Secondary | ICD-10-CM | POA: Diagnosis not present

## 2019-09-07 DIAGNOSIS — M4800 Spinal stenosis, site unspecified: Secondary | ICD-10-CM | POA: Diagnosis not present

## 2019-09-07 DIAGNOSIS — F419 Anxiety disorder, unspecified: Secondary | ICD-10-CM | POA: Diagnosis not present

## 2019-09-07 DIAGNOSIS — Z9889 Other specified postprocedural states: Secondary | ICD-10-CM | POA: Diagnosis not present

## 2019-09-07 DIAGNOSIS — R627 Adult failure to thrive: Secondary | ICD-10-CM | POA: Diagnosis not present

## 2019-09-07 DIAGNOSIS — K219 Gastro-esophageal reflux disease without esophagitis: Secondary | ICD-10-CM | POA: Diagnosis not present

## 2019-09-07 DIAGNOSIS — E039 Hypothyroidism, unspecified: Secondary | ICD-10-CM | POA: Diagnosis not present

## 2019-09-07 DIAGNOSIS — Z8679 Personal history of other diseases of the circulatory system: Secondary | ICD-10-CM | POA: Diagnosis not present

## 2019-09-07 DIAGNOSIS — I10 Essential (primary) hypertension: Secondary | ICD-10-CM | POA: Diagnosis not present

## 2019-09-07 DIAGNOSIS — D630 Anemia in neoplastic disease: Secondary | ICD-10-CM | POA: Diagnosis not present

## 2019-09-07 DIAGNOSIS — I2510 Atherosclerotic heart disease of native coronary artery without angina pectoris: Secondary | ICD-10-CM | POA: Diagnosis not present

## 2019-09-07 DIAGNOSIS — M4856XD Collapsed vertebra, not elsewhere classified, lumbar region, subsequent encounter for fracture with routine healing: Secondary | ICD-10-CM | POA: Diagnosis not present

## 2019-09-07 DIAGNOSIS — M549 Dorsalgia, unspecified: Secondary | ICD-10-CM | POA: Diagnosis not present

## 2019-09-07 DIAGNOSIS — S72001D Fracture of unspecified part of neck of right femur, subsequent encounter for closed fracture with routine healing: Secondary | ICD-10-CM | POA: Diagnosis not present

## 2019-09-07 DIAGNOSIS — J449 Chronic obstructive pulmonary disease, unspecified: Secondary | ICD-10-CM | POA: Diagnosis not present

## 2019-09-07 DIAGNOSIS — R634 Abnormal weight loss: Secondary | ICD-10-CM | POA: Diagnosis not present

## 2019-09-07 DIAGNOSIS — F329 Major depressive disorder, single episode, unspecified: Secondary | ICD-10-CM | POA: Diagnosis not present

## 2019-09-08 DIAGNOSIS — J449 Chronic obstructive pulmonary disease, unspecified: Secondary | ICD-10-CM | POA: Diagnosis not present

## 2019-09-08 DIAGNOSIS — F419 Anxiety disorder, unspecified: Secondary | ICD-10-CM | POA: Diagnosis not present

## 2019-09-08 DIAGNOSIS — I10 Essential (primary) hypertension: Secondary | ICD-10-CM | POA: Diagnosis not present

## 2019-09-08 DIAGNOSIS — I2510 Atherosclerotic heart disease of native coronary artery without angina pectoris: Secondary | ICD-10-CM | POA: Diagnosis not present

## 2019-09-08 DIAGNOSIS — D630 Anemia in neoplastic disease: Secondary | ICD-10-CM | POA: Diagnosis not present

## 2019-09-08 DIAGNOSIS — D469 Myelodysplastic syndrome, unspecified: Secondary | ICD-10-CM | POA: Diagnosis not present

## 2019-09-09 DIAGNOSIS — I10 Essential (primary) hypertension: Secondary | ICD-10-CM | POA: Diagnosis not present

## 2019-09-09 DIAGNOSIS — J449 Chronic obstructive pulmonary disease, unspecified: Secondary | ICD-10-CM | POA: Diagnosis not present

## 2019-09-09 DIAGNOSIS — I2510 Atherosclerotic heart disease of native coronary artery without angina pectoris: Secondary | ICD-10-CM | POA: Diagnosis not present

## 2019-09-09 DIAGNOSIS — D630 Anemia in neoplastic disease: Secondary | ICD-10-CM | POA: Diagnosis not present

## 2019-09-09 DIAGNOSIS — D469 Myelodysplastic syndrome, unspecified: Secondary | ICD-10-CM | POA: Diagnosis not present

## 2019-09-09 DIAGNOSIS — F419 Anxiety disorder, unspecified: Secondary | ICD-10-CM | POA: Diagnosis not present

## 2019-09-10 DIAGNOSIS — I10 Essential (primary) hypertension: Secondary | ICD-10-CM | POA: Diagnosis not present

## 2019-09-10 DIAGNOSIS — J449 Chronic obstructive pulmonary disease, unspecified: Secondary | ICD-10-CM | POA: Diagnosis not present

## 2019-09-10 DIAGNOSIS — D469 Myelodysplastic syndrome, unspecified: Secondary | ICD-10-CM | POA: Diagnosis not present

## 2019-09-10 DIAGNOSIS — F419 Anxiety disorder, unspecified: Secondary | ICD-10-CM | POA: Diagnosis not present

## 2019-09-10 DIAGNOSIS — D630 Anemia in neoplastic disease: Secondary | ICD-10-CM | POA: Diagnosis not present

## 2019-09-10 DIAGNOSIS — I2510 Atherosclerotic heart disease of native coronary artery without angina pectoris: Secondary | ICD-10-CM | POA: Diagnosis not present

## 2019-09-11 DIAGNOSIS — J449 Chronic obstructive pulmonary disease, unspecified: Secondary | ICD-10-CM | POA: Diagnosis not present

## 2019-09-11 DIAGNOSIS — F419 Anxiety disorder, unspecified: Secondary | ICD-10-CM | POA: Diagnosis not present

## 2019-09-11 DIAGNOSIS — I10 Essential (primary) hypertension: Secondary | ICD-10-CM | POA: Diagnosis not present

## 2019-09-11 DIAGNOSIS — D469 Myelodysplastic syndrome, unspecified: Secondary | ICD-10-CM | POA: Diagnosis not present

## 2019-09-11 DIAGNOSIS — I2510 Atherosclerotic heart disease of native coronary artery without angina pectoris: Secondary | ICD-10-CM | POA: Diagnosis not present

## 2019-09-11 DIAGNOSIS — D630 Anemia in neoplastic disease: Secondary | ICD-10-CM | POA: Diagnosis not present

## 2019-09-15 DIAGNOSIS — I10 Essential (primary) hypertension: Secondary | ICD-10-CM | POA: Diagnosis not present

## 2019-09-15 DIAGNOSIS — J449 Chronic obstructive pulmonary disease, unspecified: Secondary | ICD-10-CM | POA: Diagnosis not present

## 2019-09-15 DIAGNOSIS — I2510 Atherosclerotic heart disease of native coronary artery without angina pectoris: Secondary | ICD-10-CM | POA: Diagnosis not present

## 2019-09-15 DIAGNOSIS — D630 Anemia in neoplastic disease: Secondary | ICD-10-CM | POA: Diagnosis not present

## 2019-09-15 DIAGNOSIS — D469 Myelodysplastic syndrome, unspecified: Secondary | ICD-10-CM | POA: Diagnosis not present

## 2019-09-15 DIAGNOSIS — F419 Anxiety disorder, unspecified: Secondary | ICD-10-CM | POA: Diagnosis not present

## 2019-09-16 DIAGNOSIS — F419 Anxiety disorder, unspecified: Secondary | ICD-10-CM | POA: Diagnosis not present

## 2019-09-16 DIAGNOSIS — I10 Essential (primary) hypertension: Secondary | ICD-10-CM | POA: Diagnosis not present

## 2019-09-16 DIAGNOSIS — D630 Anemia in neoplastic disease: Secondary | ICD-10-CM | POA: Diagnosis not present

## 2019-09-16 DIAGNOSIS — D469 Myelodysplastic syndrome, unspecified: Secondary | ICD-10-CM | POA: Diagnosis not present

## 2019-09-16 DIAGNOSIS — J449 Chronic obstructive pulmonary disease, unspecified: Secondary | ICD-10-CM | POA: Diagnosis not present

## 2019-09-16 DIAGNOSIS — I2510 Atherosclerotic heart disease of native coronary artery without angina pectoris: Secondary | ICD-10-CM | POA: Diagnosis not present

## 2019-09-18 DIAGNOSIS — D630 Anemia in neoplastic disease: Secondary | ICD-10-CM | POA: Diagnosis not present

## 2019-09-18 DIAGNOSIS — D469 Myelodysplastic syndrome, unspecified: Secondary | ICD-10-CM | POA: Diagnosis not present

## 2019-09-18 DIAGNOSIS — F419 Anxiety disorder, unspecified: Secondary | ICD-10-CM | POA: Diagnosis not present

## 2019-09-18 DIAGNOSIS — I10 Essential (primary) hypertension: Secondary | ICD-10-CM | POA: Diagnosis not present

## 2019-09-18 DIAGNOSIS — I2510 Atherosclerotic heart disease of native coronary artery without angina pectoris: Secondary | ICD-10-CM | POA: Diagnosis not present

## 2019-09-18 DIAGNOSIS — J449 Chronic obstructive pulmonary disease, unspecified: Secondary | ICD-10-CM | POA: Diagnosis not present

## 2019-09-19 DIAGNOSIS — I10 Essential (primary) hypertension: Secondary | ICD-10-CM | POA: Diagnosis not present

## 2019-09-19 DIAGNOSIS — D469 Myelodysplastic syndrome, unspecified: Secondary | ICD-10-CM | POA: Diagnosis not present

## 2019-09-19 DIAGNOSIS — D630 Anemia in neoplastic disease: Secondary | ICD-10-CM | POA: Diagnosis not present

## 2019-09-19 DIAGNOSIS — J449 Chronic obstructive pulmonary disease, unspecified: Secondary | ICD-10-CM | POA: Diagnosis not present

## 2019-09-19 DIAGNOSIS — I2510 Atherosclerotic heart disease of native coronary artery without angina pectoris: Secondary | ICD-10-CM | POA: Diagnosis not present

## 2019-09-19 DIAGNOSIS — F419 Anxiety disorder, unspecified: Secondary | ICD-10-CM | POA: Diagnosis not present

## 2019-09-20 DIAGNOSIS — I10 Essential (primary) hypertension: Secondary | ICD-10-CM | POA: Diagnosis not present

## 2019-09-20 DIAGNOSIS — D630 Anemia in neoplastic disease: Secondary | ICD-10-CM | POA: Diagnosis not present

## 2019-09-20 DIAGNOSIS — D469 Myelodysplastic syndrome, unspecified: Secondary | ICD-10-CM | POA: Diagnosis not present

## 2019-09-20 DIAGNOSIS — J449 Chronic obstructive pulmonary disease, unspecified: Secondary | ICD-10-CM | POA: Diagnosis not present

## 2019-09-20 DIAGNOSIS — I2510 Atherosclerotic heart disease of native coronary artery without angina pectoris: Secondary | ICD-10-CM | POA: Diagnosis not present

## 2019-09-20 DIAGNOSIS — F419 Anxiety disorder, unspecified: Secondary | ICD-10-CM | POA: Diagnosis not present

## 2019-09-28 IMAGING — XA DG C-ARM 1-60 MIN
1 series · 1 of 1 positions shown · non-contrast
Comparison: None.

CLINICAL DATA: Kyphoplasty at T12

FLUOROSCOPY TIME:  1 minutes 14 seconds.
Images: 2
EXAM:
THORACIC SPINE 2 VIEWS

[Series 5: ortho standard · 1 of 1 slices shown]
[im 1/1]
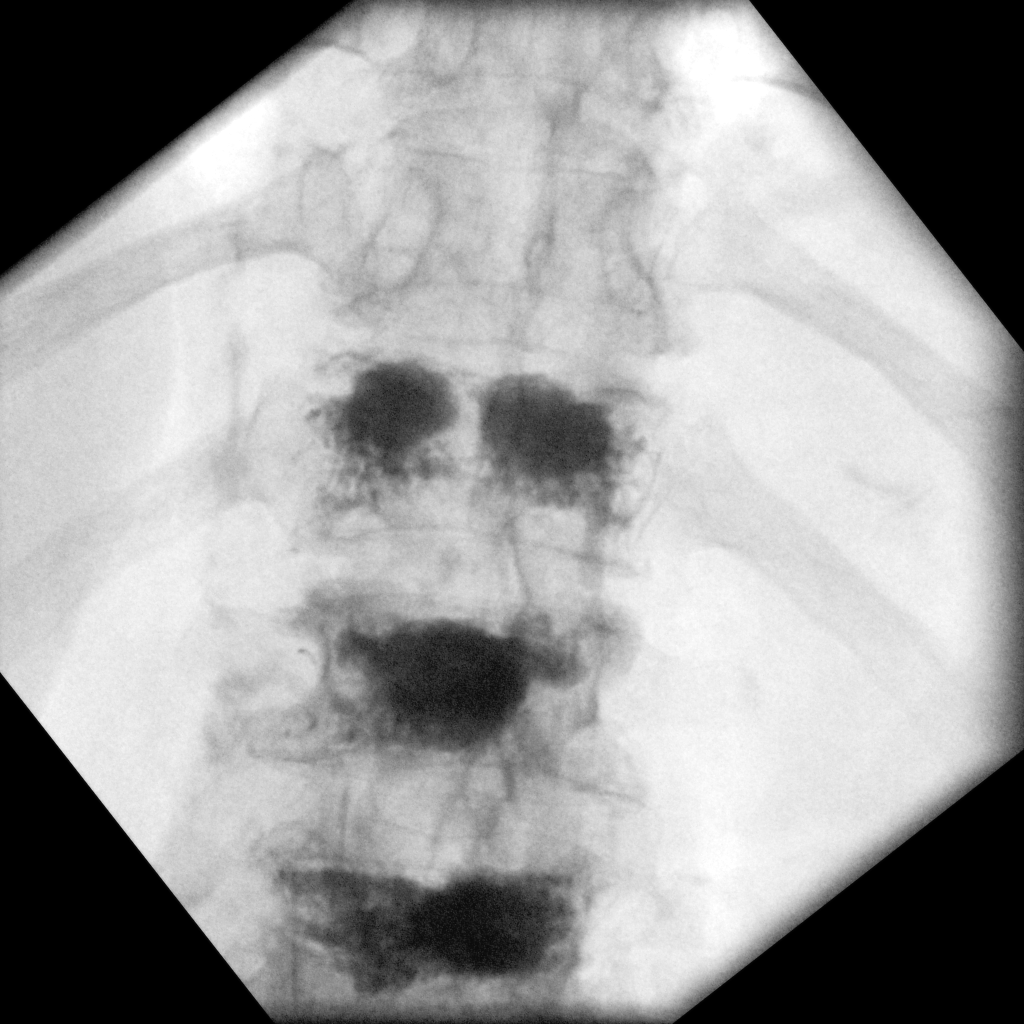

[1 of 1 positions shown; findings below may reference images not displayed]

FINDINGS: The patient is status post kyphoplasty of T12. Previous kyphoplasty
is also seen at L1 and L2.
IMPRESSION: The patient is status post kyphoplasty at T12 in the interval.

## 2019-10-07 DEATH — deceased
